# Patient Record
Sex: Female | Born: 1965 | Race: White | Hispanic: No | Marital: Married
Health system: Southern US, Academic
[De-identification: ages and names within clinical notes are randomized; demographics above are authoritative.]

## PROBLEM LIST (undated history)

## (undated) ENCOUNTER — Encounter

## (undated) ENCOUNTER — Ambulatory Visit

## (undated) ENCOUNTER — Telehealth

## (undated) ENCOUNTER — Ambulatory Visit: Payer: MEDICARE

## (undated) ENCOUNTER — Encounter: Attending: Physician Assistant | Primary: Physician Assistant

## (undated) ENCOUNTER — Ambulatory Visit: Payer: MEDICARE | Attending: Obesity Medicine | Primary: Obesity Medicine

## (undated) ENCOUNTER — Inpatient Hospital Stay

## (undated) DIAGNOSIS — K7460 Unspecified cirrhosis of liver: Secondary | ICD-10-CM

## (undated) DIAGNOSIS — D696 Thrombocytopenia, unspecified: Secondary | ICD-10-CM

## (undated) DIAGNOSIS — E039 Hypothyroidism, unspecified: Secondary | ICD-10-CM

## (undated) DIAGNOSIS — R569 Unspecified convulsions: Secondary | ICD-10-CM

## (undated) HISTORY — PX: ABDOMINAL HYSTERECTOMY: SHX81

## (undated) HISTORY — PX: CARDIAC CATHETERIZATION: SHX172

## (undated) HISTORY — PX: OTHER SURGICAL HISTORY: SHX169

## (undated) SURGERY — Surgical Case
Anesthesia: *Unknown

---
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
## 2000-07-18 ENCOUNTER — Emergency Department (HOSPITAL_COMMUNITY): Admission: EM | Admit: 2000-07-18 | Discharge: 2000-07-18 | Payer: Self-pay | Admitting: *Deleted

## 2004-03-02 ENCOUNTER — Other Ambulatory Visit: Admission: RE | Admit: 2004-03-02 | Discharge: 2004-03-02 | Payer: Self-pay

## 2006-06-11 ENCOUNTER — Emergency Department (HOSPITAL_COMMUNITY): Admission: EM | Admit: 2006-06-11 | Discharge: 2006-06-11 | Payer: Self-pay | Admitting: Emergency Medicine

## 2007-03-27 ENCOUNTER — Ambulatory Visit (HOSPITAL_COMMUNITY): Admission: RE | Admit: 2007-03-27 | Discharge: 2007-03-27 | Payer: Self-pay | Admitting: Family Medicine

## 2008-12-12 ENCOUNTER — Emergency Department (HOSPITAL_COMMUNITY): Admission: EM | Admit: 2008-12-12 | Discharge: 2008-12-12 | Payer: Self-pay | Admitting: Emergency Medicine

## 2012-05-28 ENCOUNTER — Other Ambulatory Visit (HOSPITAL_COMMUNITY): Payer: Self-pay | Admitting: Gastroenterology

## 2012-05-28 DIAGNOSIS — K7460 Unspecified cirrhosis of liver: Secondary | ICD-10-CM

## 2012-06-04 ENCOUNTER — Ambulatory Visit (HOSPITAL_COMMUNITY)
Admission: RE | Admit: 2012-06-04 | Discharge: 2012-06-04 | Disposition: A | Discharge status: Home / Self Care | Payer: PRIVATE HEALTH INSURANCE | Source: Ambulatory Visit | Attending: Gastroenterology | Admitting: Gastroenterology

## 2012-06-04 DIAGNOSIS — K7460 Unspecified cirrhosis of liver: Secondary | ICD-10-CM | POA: Insufficient documentation

## 2012-06-04 DIAGNOSIS — R161 Splenomegaly, not elsewhere classified: Secondary | ICD-10-CM | POA: Insufficient documentation

## 2013-03-30 ENCOUNTER — Emergency Department (HOSPITAL_COMMUNITY): Payer: BC Managed Care – PPO

## 2013-03-30 ENCOUNTER — Encounter (HOSPITAL_COMMUNITY): Payer: Self-pay | Admitting: Emergency Medicine

## 2013-03-30 ENCOUNTER — Emergency Department (HOSPITAL_COMMUNITY)
Admission: EM | Admit: 2013-03-30 | Discharge: 2013-03-30 | Disposition: A | Discharge status: Home / Self Care | Payer: BC Managed Care – PPO | Attending: Emergency Medicine | Admitting: Emergency Medicine

## 2013-03-30 DIAGNOSIS — R1013 Epigastric pain: Secondary | ICD-10-CM | POA: Insufficient documentation

## 2013-03-30 DIAGNOSIS — R109 Unspecified abdominal pain: Secondary | ICD-10-CM

## 2013-03-30 DIAGNOSIS — E039 Hypothyroidism, unspecified: Secondary | ICD-10-CM | POA: Insufficient documentation

## 2013-03-30 DIAGNOSIS — Z79899 Other long term (current) drug therapy: Secondary | ICD-10-CM | POA: Insufficient documentation

## 2013-03-30 DIAGNOSIS — I10 Essential (primary) hypertension: Secondary | ICD-10-CM | POA: Insufficient documentation

## 2013-03-30 DIAGNOSIS — Z90710 Acquired absence of both cervix and uterus: Secondary | ICD-10-CM | POA: Insufficient documentation

## 2013-03-30 HISTORY — DX: Hypothyroidism, unspecified: E03.9

## 2013-03-30 LAB — URINALYSIS, ROUTINE W REFLEX MICROSCOPIC
Bilirubin Urine: NEGATIVE
Hgb urine dipstick: NEGATIVE
Specific Gravity, Urine: 1.025 (ref 1.005–1.030)
Urobilinogen, UA: 1 mg/dL (ref 0.0–1.0)

## 2013-03-30 LAB — CBC WITH DIFFERENTIAL/PLATELET
Eosinophils Absolute: 0.2 10*3/uL (ref 0.0–0.7)
Lymphocytes Relative: 39 % (ref 12–46)
Lymphs Abs: 3.3 10*3/uL (ref 0.7–4.0)
Neutro Abs: 4.3 10*3/uL (ref 1.7–7.7)
Neutrophils Relative %: 50 % (ref 43–77)
Platelets: 170 10*3/uL (ref 150–400)
RBC: 4.71 MIL/uL (ref 3.87–5.11)
WBC: 8.6 10*3/uL (ref 4.0–10.5)

## 2013-03-30 LAB — COMPREHENSIVE METABOLIC PANEL
ALT: 36 U/L — ABNORMAL HIGH (ref 0–35)
Alkaline Phosphatase: 113 U/L (ref 39–117)
CO2: 26 mEq/L (ref 19–32)
GFR calc Af Amer: >90 mL/min (ref >90)
Glucose, Bld: 122 mg/dL — ABNORMAL HIGH (ref 70–99)
Potassium: 3.8 mEq/L (ref 3.5–5.1)
Sodium: 137 mEq/L (ref 135–145)
Total Protein: 8.3 g/dL (ref 6.0–8.3)

## 2013-03-30 LAB — URINE MICROSCOPIC-ADD ON

## 2013-03-30 MED ORDER — HYDROCODONE-ACETAMINOPHEN 5-325 MG PO TABS: 1.0000 | ORAL_TABLET | Freq: Four times a day (QID) | ORAL | Status: DC | PRN

## 2013-03-30 MED ORDER — PANTOPRAZOLE SODIUM 20 MG PO TBEC: 20.0000 mg | DELAYED_RELEASE_TABLET | Freq: Every day | ORAL | Status: DC

## 2013-03-30 MED ORDER — IOHEXOL 300 MG/ML  SOLN
50.0000 mL | Freq: Once | INTRAMUSCULAR | Status: AC | PRN
  Administered 2013-03-30: 50 mL via ORAL

## 2013-03-30 MED ORDER — ONDANSETRON HCL 4 MG/2ML IJ SOLN
4.0000 mg | Freq: Once | INTRAMUSCULAR | Status: AC
  Administered 2013-03-30: 4 mg via INTRAVENOUS
  Filled 2013-03-30: qty 2

## 2013-03-30 MED ORDER — IOHEXOL 300 MG/ML  SOLN
100.0000 mL | Freq: Once | INTRAMUSCULAR | Status: AC | PRN
  Administered 2013-03-30: 100 mL via INTRAVENOUS

## 2013-03-30 MED ORDER — SODIUM CHLORIDE 0.9 % IV SOLN
Freq: Once | INTRAVENOUS | Status: AC
  Administered 2013-03-30: 21:00:00 via INTRAVENOUS

## 2013-03-30 MED ORDER — PANTOPRAZOLE SODIUM 40 MG IV SOLR
40.0000 mg | Freq: Once | INTRAVENOUS | Status: AC
  Administered 2013-03-30: 40 mg via INTRAVENOUS
  Filled 2013-03-30: qty 40

## 2013-03-30 MED ORDER — HYDROMORPHONE HCL PF 1 MG/ML IJ SOLN
1.0000 mg | Freq: Once | INTRAMUSCULAR | Status: AC
  Administered 2013-03-30: 1 mg via INTRAVENOUS
  Filled 2013-03-30: qty 1

## 2013-03-30 MED ORDER — PROMETHAZINE HCL 25 MG PO TABS: 25.0000 mg | ORAL_TABLET | Freq: Four times a day (QID) | ORAL | Status: DC | PRN

## 2013-03-30 NOTE — ED Provider Notes (Addendum)
History     CSN: [id number]  Arrival date & time 03/30/13  4098   First MD Initiated Contact with Patient 03/30/13 2009      Chief Complaint  Patient presents with  . Abdominal Pain    (Consider location/radiation/quality/duration/timing/severity/associated sxs/prior treatment) Patient is a 47 y.o. female presenting with abdominal pain. The history is provided by the patient (pt complains of abd pain). No language interpreter was used.  Abdominal Pain Pain location:  Epigastric Pain quality: aching   Pain radiates to:  Does not radiate Pain severity:  Moderate Onset quality:  Gradual Timing:  Intermittent Progression:  Waxing and waning Chronicity:  New Associated symptoms: no chest pain, no cough, no diarrhea, no fatigue and no hematuria     Past Medical History  Diagnosis Date  . Hypertension   . Thyroid disease   . Hypothyroidism     Past Surgical History  Procedure Laterality Date  . Abdominal hysterectomy      History reviewed. No pertinent family history.  History  Substance Use Topics  . Smoking status: Never Smoker   . Smokeless tobacco: Not on file  . Alcohol Use: No    OB History   Grav Para Term Preterm Abortions TAB SAB Ect Mult Living                  Review of Systems  Constitutional: Negative for appetite change and fatigue.  HENT: Negative for congestion, sinus pressure and ear discharge.   Eyes: Negative for discharge.  Respiratory: Negative for cough.   Cardiovascular: Negative for chest pain.  Gastrointestinal: Positive for abdominal pain. Negative for diarrhea.  Genitourinary: Negative for frequency and hematuria.  Musculoskeletal: Negative for back pain.  Skin: Negative for rash.  Neurological: Negative for seizures and headaches.  Psychiatric/Behavioral: Negative for hallucinations.    Allergies  Review of patient's allergies indicates no known allergies.  Home Medications   Current Outpatient Rx  Name  Route  Sig   Dispense  Refill  . estradiol (ESTRACE) 0.5 MG tablet   Oral   Take 0.5 mg by mouth daily.         Marland Kitchen levothyroxine (SYNTHROID, LEVOTHROID) 137 MCG tablet   Oral   Take 137 mcg by mouth every morning.            BP 136/78  Pulse 84  Temp(Src) 98.4 F (36.9 C) (Oral)  Resp 22  Ht 5\' 2"  (1.575 m)  Wt 213 lb (96.616 kg)  BMI 38.95 kg/m2  SpO2 99%  Physical Exam  Constitutional: She is oriented to person, place, and time. She appears well-developed.  HENT:  Head: Normocephalic.  Eyes: Conjunctivae and EOM are normal. No scleral icterus.  Neck: Neck supple. No thyromegaly present.  Cardiovascular: Normal rate and regular rhythm.  Exam reveals no gallop and no friction rub.   No murmur heard. Pulmonary/Chest: No stridor. She has no wheezes. She has no rales. She exhibits no tenderness.  Abdominal: She exhibits no distension. There is tenderness. There is no rebound.  Tender epigastric  Musculoskeletal: Normal range of motion. She exhibits no edema.  Lymphadenopathy:    She has no cervical adenopathy.  Neurological: She is oriented to person, place, and time. Coordination normal.  Skin: No rash noted. No erythema.  Psychiatric: She has a normal mood and affect. Her behavior is normal.    ED Course  Procedures (including critical care time)  Labs Reviewed  URINALYSIS, ROUTINE W REFLEX MICROSCOPIC - Abnormal; Notable for the  following:    Leukocytes, UA SMALL (*)    All other components within normal limits  URINE MICROSCOPIC-ADD ON - Abnormal; Notable for the following:    Squamous Epithelial / LPF FEW (*)    Bacteria, UA FEW (*)    All other components within normal limits  URINE CULTURE  CBC WITH DIFFERENTIAL  COMPREHENSIVE METABOLIC PANEL  LIPASE, BLOOD   No results found.   No diagnosis found.    MDM    Pt to follow up with gi      Benny Lennert, MD 03/30/13 2116  Benny Lennert, MD 03/30/13 2226

## 2013-03-30 NOTE — ED Notes (Signed)
Pt alert & oriented x4, stable gait. Patient given discharge instructions, paperwork & prescription(s). Patient  instructed to stop at the registration desk to finish any additional paperwork. Patient verbalized understanding. Pt left department w/ no further questions. 

## 2013-03-30 NOTE — ED Notes (Signed)
Patient complaining of epigastric pain and nausea since Thursday.

## 2013-04-02 LAB — URINE CULTURE

## 2014-02-16 HISTORY — PX: LIVER BIOPSY: SHX301

## 2014-12-16 ENCOUNTER — Encounter: Payer: Self-pay | Admitting: Cardiovascular Disease

## 2014-12-17 ENCOUNTER — Other Ambulatory Visit (HOSPITAL_COMMUNITY): Payer: Self-pay | Admitting: Internal Medicine

## 2014-12-17 DIAGNOSIS — Z1231 Encounter for screening mammogram for malignant neoplasm of breast: Secondary | ICD-10-CM

## 2014-12-19 ENCOUNTER — Other Ambulatory Visit (HOSPITAL_COMMUNITY): Payer: Self-pay | Admitting: Internal Medicine

## 2014-12-19 DIAGNOSIS — R945 Abnormal results of liver function studies: Secondary | ICD-10-CM

## 2014-12-22 ENCOUNTER — Encounter (HOSPITAL_COMMUNITY): Payer: Self-pay | Admitting: *Deleted

## 2014-12-22 ENCOUNTER — Emergency Department (HOSPITAL_COMMUNITY)
Admission: EM | Admit: 2014-12-22 | Discharge: 2014-12-22 | Disposition: A | Discharge status: Home / Self Care | Payer: BLUE CROSS/BLUE SHIELD | Attending: Emergency Medicine | Admitting: Emergency Medicine

## 2014-12-22 DIAGNOSIS — Z79899 Other long term (current) drug therapy: Secondary | ICD-10-CM | POA: Diagnosis not present

## 2014-12-22 DIAGNOSIS — Z3202 Encounter for pregnancy test, result negative: Secondary | ICD-10-CM | POA: Diagnosis not present

## 2014-12-22 DIAGNOSIS — E669 Obesity, unspecified: Secondary | ICD-10-CM | POA: Insufficient documentation

## 2014-12-22 DIAGNOSIS — Z793 Long term (current) use of hormonal contraceptives: Secondary | ICD-10-CM | POA: Diagnosis not present

## 2014-12-22 DIAGNOSIS — E039 Hypothyroidism, unspecified: Secondary | ICD-10-CM | POA: Diagnosis not present

## 2014-12-22 DIAGNOSIS — I10 Essential (primary) hypertension: Secondary | ICD-10-CM | POA: Insufficient documentation

## 2014-12-22 DIAGNOSIS — Z90710 Acquired absence of both cervix and uterus: Secondary | ICD-10-CM | POA: Diagnosis not present

## 2014-12-22 DIAGNOSIS — R17 Unspecified jaundice: Secondary | ICD-10-CM | POA: Diagnosis not present

## 2014-12-22 DIAGNOSIS — R1010 Upper abdominal pain, unspecified: Secondary | ICD-10-CM | POA: Diagnosis present

## 2014-12-22 HISTORY — DX: Unspecified convulsions: R56.9

## 2014-12-22 LAB — COMPREHENSIVE METABOLIC PANEL
ALT: 38 U/L — ABNORMAL HIGH (ref 0–35)
AST: 81 U/L — ABNORMAL HIGH (ref 0–37)
Albumin: 3.1 g/dL — ABNORMAL LOW (ref 3.5–5.2)
Alkaline Phosphatase: 126 U/L — ABNORMAL HIGH (ref 39–117)
Anion gap: 6 (ref 5–15)
BUN: 11 mg/dL (ref 6–23)
CALCIUM: 8.5 mg/dL (ref 8.4–10.5)
CHLORIDE: 107 meq/L (ref 96–112)
CO2: 23 mmol/L (ref 19–32)
CREATININE: 0.46 mg/dL — AB (ref 0.50–1.10)
Glucose, Bld: 95 mg/dL (ref 70–99)
POTASSIUM: 4.2 mmol/L (ref 3.5–5.1)
SODIUM: 136 mmol/L (ref 135–145)
Total Bilirubin: 4 mg/dL — ABNORMAL HIGH (ref 0.3–1.2)
Total Protein: 6.8 g/dL (ref 6.0–8.3)

## 2014-12-22 LAB — URINALYSIS, ROUTINE W REFLEX MICROSCOPIC
GLUCOSE, UA: NEGATIVE mg/dL
Hgb urine dipstick: NEGATIVE
KETONES UR: NEGATIVE mg/dL
Nitrite: NEGATIVE
PH: 5.5 (ref 5.0–8.0)
Protein, ur: NEGATIVE mg/dL
Specific Gravity, Urine: >1.03 — ABNORMAL HIGH (ref 1.005–1.030)
UROBILINOGEN UA: 1 mg/dL (ref 0.0–1.0)

## 2014-12-22 LAB — CBC WITH DIFFERENTIAL/PLATELET
BASOS ABS: 0 10*3/uL (ref 0.0–0.1)
BASOS PCT: 1 % (ref 0–1)
EOS ABS: 0.1 10*3/uL (ref 0.0–0.7)
EOS PCT: 3 % (ref 0–5)
HCT: 36.8 % (ref 36.0–46.0)
HEMOGLOBIN: 12.4 g/dL (ref 12.0–15.0)
Lymphocytes Relative: 29 % (ref 12–46)
Lymphs Abs: 1.2 10*3/uL (ref 0.7–4.0)
MCH: 32 pg (ref 26.0–34.0)
MCHC: 33.7 g/dL (ref 30.0–36.0)
MCV: 95.1 fL (ref 78.0–100.0)
Monocytes Absolute: 0.5 10*3/uL (ref 0.1–1.0)
Monocytes Relative: 11 % (ref 3–12)
NEUTROS ABS: 2.4 10*3/uL (ref 1.7–7.7)
Neutrophils Relative %: 56 % (ref 43–77)
PLATELETS: 92 10*3/uL — AB (ref 150–400)
RBC: 3.87 MIL/uL (ref 3.87–5.11)
RDW: 16.6 % — AB (ref 11.5–15.5)
WBC: 4.2 10*3/uL (ref 4.0–10.5)

## 2014-12-22 LAB — LIPASE, BLOOD: Lipase: 41 U/L (ref 11–59)

## 2014-12-22 LAB — URINE MICROSCOPIC-ADD ON

## 2014-12-22 LAB — POC URINE PREG, ED: PREG TEST UR: NEGATIVE

## 2014-12-22 MED ORDER — MORPHINE SULFATE 4 MG/ML IJ SOLN
4.0000 mg | Freq: Once | INTRAMUSCULAR | Status: AC
  Administered 2014-12-22: 4 mg via INTRAVENOUS
  Filled 2014-12-22: qty 1

## 2014-12-22 NOTE — ED Notes (Addendum)
Upper abd pain, nausea, no vomiting,   Onset sz in December 2015, and told she had elevated liver tests.   ? Jaundice of eyes

## 2014-12-22 NOTE — ED Provider Notes (Signed)
CSN: [id number]     Arrival date & time 12/22/14  1906 History  This chart was scribed for Linwood Dibbles, MD by Tonye Royalty, ED Scribe. This patient was seen in room APA03/APA03 and the patient's care was started at 9:52 PM.    Chief Complaint  Patient presents with  . Abdominal Pain   The history is provided by the patient and the spouse. No language interpreter was used.   HPI Comments: Cynthia Stephenson is a 49 y.o. female who presents to the Emergency Department complaining of upper abdominal pain with onset 8 hours ago. She states that she had a seizure on 12/22 and was evaluated at the ED; she followed up with Dr. Margo Aye 6 days ago, at which time lab work revealed elevated liver enzymes. She was supposed to return for ultrasound 2 days from now, but was instructed to come to the ED in case of abdominal pain. Records indicate she had also had abnormal liver lab result and CT showing cirrhosis during ED visit in April of 2014 and was supposed to follow up with Dr. Hessie Diener; she and husband are unable to recall if she followed up. She states she has not had other blood work since 2014 and states that no mention was made of her liver enzymes upon ED visit to evaluate seizure on 12/22. She denies vomiting, diarrhea, or SOB.  Past Medical History  Diagnosis Date  . Hypertension   . Thyroid disease   . Hypothyroidism   . Seizures    Past Surgical History  Procedure Laterality Date  . Abdominal hysterectomy    . Abdominal hysterectomy     History reviewed. No pertinent family history. History  Substance Use Topics  . Smoking status: Never Smoker   . Smokeless tobacco: Not on file  . Alcohol Use: No   OB History    No data available     Review of Systems  Respiratory: Negative for shortness of breath.   Gastrointestinal: Positive for abdominal pain. Negative for vomiting and diarrhea.  All other systems reviewed and are negative.     Allergies  Review of patient's allergies indicates no  known allergies.  Home Medications   Prior to Admission medications   Medication Sig Start Date End Date Taking? Authorizing Provider  estradiol (ESTRACE) 0.5 MG tablet Take 0.5 mg by mouth daily.    Historical Provider, MD  HYDROcodone-acetaminophen (NORCO/VICODIN) 5-325 MG per tablet Take 1 tablet by mouth every 6 (six) hours as needed for pain. 03/30/13   Benny Lennert, MD  levothyroxine (SYNTHROID, LEVOTHROID) 137 MCG tablet Take 137 mcg by mouth every morning.     Historical Provider, MD  pantoprazole (PROTONIX) 20 MG tablet Take 1 tablet (20 mg total) by mouth daily. 03/30/13   Benny Lennert, MD  promethazine (PHENERGAN) 25 MG tablet Take 1 tablet (25 mg total) by mouth every 6 (six) hours as needed for nausea. 03/30/13   Benny Lennert, MD   BP 116/58 mmHg  Pulse 86  Temp(Src) 99.1 F (37.3 C) (Oral)  Resp 20  Ht  (1.575 m)  Wt 225 lb (102.059 kg)  BMI 41.14 kg/m2  SpO2 99% Physical Exam  Constitutional: She appears well-developed. No distress.  obese  HENT:  Head: Normocephalic and atraumatic.  Right Ear: External ear normal.  Left Ear: External ear normal.  Eyes: Conjunctivae are normal. Right eye exhibits no discharge. Left eye exhibits no discharge. No scleral icterus.  Neck: Neck supple. No tracheal deviation  present.  Cardiovascular: Normal rate, regular rhythm and intact distal pulses.   Pulmonary/Chest: Effort normal and breath sounds normal. No stridor. No respiratory distress. She has no wheezes. She has no rales.  Abdominal: Soft. Bowel sounds are normal. She exhibits no distension. There is no tenderness. There is no rebound and no guarding.  Musculoskeletal: She exhibits no edema or tenderness.  Neurological: She is alert. She has normal strength. No cranial nerve deficit (no facial droop, extraocular movements intact, no slurred speech) or sensory deficit. She exhibits normal muscle tone. She displays no seizure activity. Coordination normal.  Skin: Skin  is warm and dry. No rash noted.  Psychiatric: She has a normal mood and affect.  Nursing note and vitals reviewed.   ED Course  Procedures (including critical care time)  DIAGNOSTIC STUDIES: Oxygen Saturation is 99% on room air, normal by my interpretation.    COORDINATION OF CARE: 10:00 PM Discussed treatment plan with patient at beside, the patient agrees with the plan and has no further questions at this time.   Labs Review Labs Reviewed  CBC WITH DIFFERENTIAL - Abnormal; Notable for the following:    RDW 16.6 (*)    Platelets 92 (*)    All other components within normal limits  COMPREHENSIVE METABOLIC PANEL - Abnormal; Notable for the following:    Creatinine, Ser 0.46 (*)    Albumin 3.1 (*)    AST 81 (*)    ALT 38 (*)    Alkaline Phosphatase 126 (*)    Total Bilirubin 4.0 (*)    All other components within normal limits  URINALYSIS, ROUTINE W REFLEX MICROSCOPIC - Abnormal; Notable for the following:    Specific Gravity, Urine >1.030 (*)    Bilirubin Urine SMALL (*)    Leukocytes, UA SMALL (*)    All other components within normal limits  URINE MICROSCOPIC-ADD ON - Abnormal; Notable for the following:    Squamous Epithelial / LPF MANY (*)    All other components within normal limits  LIPASE, BLOOD  POC URINE PREG, ED     MDM   Final diagnoses:  Elevated bilirubin  Jaundice   I reviewed the patient's previous record 2014. Her discharge instructions instruct her to follow-up with Dr. Karilyn Cotaehman.  The patient had findings of cirrhosis on her CT scan. Her bilirubin was elevated at 2.0 back in 2014.  Patient did not follow up with anyone after that emergency department visit.  She denies any alcohol use.  I stressed the importance of following up with a gastroenterologist. She is scheduled see her PCP this week and is going to have an outpatient ultrasound.  Feel that she's having any symptoms to suggest acute cholecystitis. The patient can safely continue that outpatient  workup. I personally performed the services described in this documentation, which was scribed in my presence.  The recorded information has been reviewed and is accurate.   Linwood DibblesJon Hudson Majkowski, MD 12/22/14 2325

## 2014-12-22 NOTE — Discharge Instructions (Signed)
Jaundice °Jaundice is a yellowish discoloration of the skin, whites of the eyes, and mucous membranes. It is caused by increased levels of bilirubin in the blood (hyperbilirubinemia). Bilirubin is produced by the normal breakdown of red blood cells. Jaundice may mean the liver or bile system is not working normally. °CAUSES  °The most common causes include: °· Viral hepatitis. °· Gallstones. °· Excess use of alcohol. °· Liver disease. °· Certain cancers. °SYMPTOMS  °· Yellow color to the skin, whites of the eyes, or mucous membranes. °· Dark brown colored urine. °· Stomach pain. °· Light or clay colored stool. °· Itchy skin. °DIAGNOSIS  °· Your history will be taken along with a physical exam. °· Urine and blood tests. °· Abdominal ultrasound. °· CT scans. °· MRI. °· Liver biopsy if the liver disease is suspected. °· Endoscopic retrograde cholangiopancreatography (ERCP). °TREATMENT  °Treatment depends on the cause or related to the treatment of an underlying condition. For example, if jaundice is caused by gallstones, the stones or gallbladder may need to be removed. Other treatments may include: °· Rest. °· Stopping a certain medicine if it is causing the jaundice. °· Giving fluid through the vein (IV fluids). °· Surgery  (removing gallstones, cancers). °Some conditions that cause jaundice can be fatal if not treated. °HOME CARE INSTRUCTIONS  °· Rest. °· Drink enough fluids to keep your urine clear or pale yellow. °· Avoid all alcoholic drinks. °· Only take over-the-counter or prescription medicines for nausea, vomiting, itching, pain, discomfort, or fever as directed by your caregiver. °· If jaundice is due to viral hepatitis or an infection: °¨ Avoid close contact with people. °¨ Avoid preparing food for others. °¨ Avoid sharing utensils with others. °¨ Wash your hands often. °· Keep all follow-up appointments with your caregiver. °· Use skin lotions to relieve itching. °SEEK IMMEDIATE MEDICAL CARE IF:  °· You  have increased pain. °· You have repeated vomiting. °· You become dehydrated. °· You have a fever or persistent symptoms for more than 72 hours. °· You have a fever and your symptoms suddenly get worse. °· You become weak or confused. °· You develop a severe headache. °MAKE SURE YOU:  °· Understand these instructions. °· Will watch your condition. °· Will get help right away if you are not doing well or get worse. °Document Released: 11/28/2005 Document Revised: 02/20/2012 Document Reviewed: 11/12/2010 °ExitCare® Patient Information ©2015 [employer]. This information is not intended to replace advice given to you by your health care provider. Make sure you discuss any questions you have with your health care provider. ° °

## 2014-12-24 ENCOUNTER — Ambulatory Visit (HOSPITAL_COMMUNITY)
Admission: RE | Admit: 2014-12-24 | Discharge: 2014-12-24 | Disposition: A | Discharge status: Home / Self Care | Payer: PRIVATE HEALTH INSURANCE | Source: Ambulatory Visit | Attending: Internal Medicine | Admitting: Internal Medicine

## 2014-12-24 ENCOUNTER — Ambulatory Visit (HOSPITAL_COMMUNITY)
Admission: RE | Admit: 2014-12-24 | Discharge: 2014-12-24 | Disposition: A | Discharge status: Home / Self Care | Payer: BLUE CROSS/BLUE SHIELD | Source: Ambulatory Visit | Attending: Internal Medicine | Admitting: Internal Medicine

## 2014-12-24 DIAGNOSIS — R945 Abnormal results of liver function studies: Secondary | ICD-10-CM | POA: Insufficient documentation

## 2014-12-24 DIAGNOSIS — R162 Hepatomegaly with splenomegaly, not elsewhere classified: Secondary | ICD-10-CM | POA: Diagnosis not present

## 2014-12-24 DIAGNOSIS — Z1231 Encounter for screening mammogram for malignant neoplasm of breast: Secondary | ICD-10-CM

## 2014-12-26 ENCOUNTER — Encounter: Payer: Self-pay | Admitting: Diagnostic Neuroimaging

## 2014-12-26 ENCOUNTER — Telehealth: Payer: Self-pay | Admitting: Diagnostic Neuroimaging

## 2014-12-26 ENCOUNTER — Ambulatory Visit (INDEPENDENT_AMBULATORY_CARE_PROVIDER_SITE_OTHER): Payer: BLUE CROSS/BLUE SHIELD | Admitting: Diagnostic Neuroimaging

## 2014-12-26 VITALS — BP 131/70 | HR 80 | Temp 96.1°F | Ht 62.0 in | Wt 227.0 lb

## 2014-12-26 DIAGNOSIS — D696 Thrombocytopenia, unspecified: Secondary | ICD-10-CM

## 2014-12-26 DIAGNOSIS — R569 Unspecified convulsions: Secondary | ICD-10-CM

## 2014-12-26 NOTE — Progress Notes (Signed)
[facility]  PATIENT: Cynthia Stephenson DOB: 10-15-1966  REFERRING CLINICIAN: Z Hall HISTORY FROM: patient, daughter (accompanied by grand-daughter) REASON FOR VISIT: new consult   HISTORICAL  CHIEF COMPLAINT:  Chief Complaint  Patient presents with  . Seizures    HISTORY OF PRESENT ILLNESS:   49 year old right-handed female here for evaluation of possible seizure. 12/02/2014, patient was feeling badly, had previous day of diarrhea. Patient left work early. After her husband came home they went shopping. While at the store patient had episode of staring, eyes rolling back, falling to the ground, convulsions for 1 minute with tongue biting and incontinence. This was witnessed by patient's husband. Bystanders also witnessed. Apparently a nurse was a bystander and.patient was having a seizure. Per medics were called to scene evaluated patient and took her to the local hospital. Patient apparently was talking, confused, slow to respond after the event. Patient has no memory until arriving in the emergency room.  Patient was admitted overnight, had CT scan the blood testing, diagnosed with urinary tract infection, and discharged on level at levetiracetam 500 mg at bedtime.   Patient has family history of seizure in maternal grandfather and maternal uncle. No history of head trauma, encephalitis or meningitis.  Also of note patient has been having some abdominal pain over past 1-2 years. Patient has had elevated LFTs in the past. Recently blood work demonstrates elevated bilirubin, low platelet levels, elevated alkaline phosphatase. Patient has noted easy bruising and bleeding lately.   REVIEW OF SYSTEMS: Full 14 system review of systems performed and notable only for memory loss confusion headache sleepiness snoring dizziness feeling cold increased thirst crashed not asleep snoring easy bruising easy bleeding weight gain fevers chills fatigue itching.  ALLERGIES: No Known  Allergies  HOME MEDICATIONS: Outpatient Prescriptions Prior to Visit  Medication Sig Dispense Refill  . estradiol (ESTRACE) 0.5 MG tablet Take 0.5 mg by mouth daily.    Marland Kitchen HYDROcodone-acetaminophen (NORCO/VICODIN) 5-325 MG per tablet Take 1 tablet by mouth every 6 (six) hours as needed for pain. 20 tablet 0  . levothyroxine (SYNTHROID, LEVOTHROID) 137 MCG tablet Take 137 mcg by mouth every morning.     . pantoprazole (PROTONIX) 20 MG tablet Take 1 tablet (20 mg total) by mouth daily. 30 tablet 0  . promethazine (PHENERGAN) 25 MG tablet Take 1 tablet (25 mg total) by mouth every 6 (six) hours as needed for nausea. 15 tablet 0   No facility-administered medications prior to visit.    PAST MEDICAL HISTORY: Past Medical History  Diagnosis Date  . Hypertension   . Thyroid disease   . Hypothyroidism   . Seizures     PAST SURGICAL HISTORY: Past Surgical History  Procedure Laterality Date  . Abdominal hysterectomy    . Abdominal hysterectomy      FAMILY HISTORY: Family History  Problem Relation Age of Onset  . Breast cancer Mother   . Skin cancer Mother   . Ovarian cancer Mother   . Thyroid cancer Mother   . Diabetes Father   . Heart disease Father     SOCIAL HISTORY:  History   Social History  . Marital Status: Married    Spouse Name: Loraine Leriche    Number of Children: 3  . Years of Education: 12   Occupational History  .  Other    [employer]   Social History Main Topics  . Smoking status: Never Smoker   . Smokeless tobacco: Not on  file  . Alcohol Use: No  . Drug Use: No  . Sexual Activity: Not on file   Other Topics Concern  . Not on file   Social History Narrative   Lives at home with Husband    caffeine use: yes     PHYSICAL EXAM  Filed Vitals:   12/26/14 0924  BP: 131/70  Pulse: 80  Temp: 96.1 F (35.6 C)  TempSrc: Oral  Height: 5\' 2"  (1.575 m)  Weight: 227 lb (102.967 kg)    Body mass index is  41.51 kg/(m^2).  No exam data present  No flowsheet data found.  GENERAL EXAM: Patient is in no distress; well developed, nourished and groomed; neck is supple  CARDIOVASCULAR: Regular rate and rhythm, no murmurs, no carotid bruits  NEUROLOGIC: MENTAL STATUS: awake, alert, oriented to person, place and time, recent and remote memory intact, normal attention and concentration, language fluent, comprehension intact, naming intact, fund of knowledge appropriate CRANIAL NERVE: no papilledema on fundoscopic exam, pupils equal and reactive to light, visual fields full to confrontation, extraocular muscles intact, no nystagmus, facial sensation and strength symmetric, hearing intact, palate elevates symmetrically, uvula midline, shoulder shrug symmetric, tongue midline. MOTOR: normal bulk and tone, full strength in the BUE, BLE; POSTURAL AND ACTION TREMOR IN BUE SENSORY: normal and symmetric to light touch, pinprick, temperature, vibration COORDINATION: finger-nose-finger, fine finger movements normal REFLEXES: deep tendon reflexes present and symmetric GAIT/STATION: narrow based gait; able to walk on toes, heels and tandem; romberg is negative    DIAGNOSTIC DATA (LABS, IMAGING, TESTING) - I reviewed patient records, labs, notes, testing and imaging myself where available.  Lab Results  Component Value Date   WBC 4.2 12/22/2014   HGB 12.4 12/22/2014   HCT 36.8 12/22/2014   MCV 95.1 12/22/2014   PLT 92* 12/22/2014      Component Value Date/Time   NA 136 12/22/2014 1913   K 4.2 12/22/2014 1913   CL 107 12/22/2014 1913   CO2 23 12/22/2014 1913   GLUCOSE 95 12/22/2014 1913   BUN 11 12/22/2014 1913   CREATININE 0.46* 12/22/2014 1913   CALCIUM 8.5 12/22/2014 1913   PROT 6.8 12/22/2014 1913   ALBUMIN 3.1* 12/22/2014 1913   AST 81* 12/22/2014 1913   ALT 38* 12/22/2014 1913   ALKPHOS 126* 12/22/2014 1913   BILITOT 4.0* 12/22/2014 1913   GFRNONAA >90 12/22/2014 1913   GFRAA >90  12/22/2014 1913   No results found for: CHOL, HDL, LDLCALC, LDLDIRECT, TRIG, CHOLHDL No results found for: ZOXW9UHGBA1C No results found for: VITAMINB12 No results found for: TSH     ASSESSMENT AND PLAN  49 y.o. year old female here with new onset seizure 12/02/2014, in setting of urinary tract infection, diarrheal illness. Also with thrombocytopenia and hyperbilirubinemia, unclear etiology. For now agree with continuing antiseizure medication. At current dose. I will check MRI brain and EEG. I've asked patient not to drive for 6 months until she is seizure free. Patient needs follow-up with PCP regarding platelet and liver function abnormalities.   PLAN:  Orders Placed This Encounter  Procedures  . MR Brain W Wo Contrast  . EEG adult   Return in about 6 weeks (around 02/06/2015).    Suanne MarkerVIKRAM R. PENUMALLI, MD 12/26/2014, 10:30 AM Certified in Neurology, Neurophysiology and Neuroimaging  [facility] [address], [address] [place], [place] [address] [phone]

## 2014-12-26 NOTE — Telephone Encounter (Signed)
Patient needs a letter written stating Dr. 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 She has an appointment on Monday and will pay the fee and pick the letter up then. She can be reached at [phone]. A detailed message can also be left at this number.

## 2014-12-26 NOTE — Patient Instructions (Signed)
1. Continue levetiracetam 500mg  at bedtime. 2. No driving until seizure free x 6 months. 3. I will setup testing.      - According to  law, you can not drive unless you are seizure free for at least 6 months and under physician's care.   - Please maintain seizure precautions. Do not participate in activities where a loss of awareness could harm you or someone else. No swimming alone, no tub bathing, no hot tubs, no driving, no operating motorized vehicles (cars, ATVs, motocycles, etc), lawnmowers or power tools. No standing at heights, such as rooftops, ladders or stairs. Avoid hot objects such as stoves, heaters, open fires. Wear a helmet when riding a bicycle, scooter, skateboard, etc. and avoid areas of traffic. Set your water heater to 120 degrees or less.

## 2014-12-29 ENCOUNTER — Ambulatory Visit (INDEPENDENT_AMBULATORY_CARE_PROVIDER_SITE_OTHER): Payer: BLUE CROSS/BLUE SHIELD | Admitting: Diagnostic Neuroimaging

## 2014-12-29 ENCOUNTER — Telehealth: Payer: Self-pay | Admitting: *Deleted

## 2014-12-29 DIAGNOSIS — R569 Unspecified convulsions: Secondary | ICD-10-CM

## 2014-12-29 NOTE — Telephone Encounter (Signed)
Form on Casandra desk. 

## 2014-12-30 DIAGNOSIS — Z0289 Encounter for other administrative examinations: Secondary | ICD-10-CM

## 2014-12-31 NOTE — Procedures (Signed)
   [facility]  EEG (ELECTROENCEPHALOGRAM) REPORT   STUDY DATE: 12/29/14  PATIENT NAME: Cynthia Stephenson DOB: Mar 11, 1966 MRN: [id number]  ORDERING CLINICIAN: Joycelyn SchmidVikram Tahmir Kleckner, MD   TECHNOLOGIST: Kaylyn LimSue Fox  TECHNIQUE: Electroencephalogram was recorded utilizing standard 10-20 system of lead placement and reformatted into average and bipolar montages.  RECORDING TIME: 30 minute ACTIVATION: hyperventilation and photic stimulation  CLINICAL INFORMATION: 49 year old female with new onset seizure.  FINDINGS: Background rhythms of 8-9 hertz and 30-40 microvolts. No focal, lateralizing, epileptiform activity or seizures are seen. Patient recorded in the awake and drowsy state. EKG channel shows regular rhythm 66 beats per minute.   IMPRESSION:  Normal EEG in the awake and drowsy states.    INTERPRETING PHYSICIAN:  Suanne MarkerVIKRAM R. Gregorio Worley, MD Certified in Neurology, Neurophysiology and Neuroimaging  [facility] [address], [address] [place], [place] [address] [phone]

## 2015-01-01 ENCOUNTER — Telehealth: Payer: Self-pay | Admitting: *Deleted

## 2015-01-01 NOTE — Telephone Encounter (Signed)
Spoke with patient on the phone.  Obtained information about location of hospital in which she stayed overnight.  Paperwork given to Dr. Marjory LiesPenumalli and will fax when completed. Will keep a copy for her when she returns for her follow-up.

## 2015-01-05 ENCOUNTER — Telehealth: Payer: Self-pay | Admitting: *Deleted

## 2015-01-05 NOTE — Telephone Encounter (Signed)
Letter completed. -VRP

## 2015-01-05 NOTE — Telephone Encounter (Signed)
Form,[employer] received,completed by Dr Marjory LiesPenumalli and [facility] faxed 01-05-15.

## 2015-01-06 ENCOUNTER — Telehealth: Payer: Self-pay | Admitting: *Deleted

## 2015-01-06 NOTE — Telephone Encounter (Signed)
Spoke with pt on the phone, have a copy of her doctors note to not drive for 6 months due to seizures. Asked if she wanted to come pick it up or have it mailed to her and she asked to have a copy saved and she would pick it up the next time she came in to the office.

## 2015-01-12 ENCOUNTER — Encounter: Payer: Self-pay | Admitting: Diagnostic Neuroimaging

## 2015-01-13 ENCOUNTER — Telehealth: Payer: Self-pay | Admitting: Diagnostic Neuroimaging

## 2015-01-13 MED ORDER — LEVETIRACETAM 500 MG PO TABS: 500.0000 mg | ORAL_TABLET | Freq: Every day | ORAL | Status: DC

## 2015-01-13 NOTE — Telephone Encounter (Signed)
Rx has been sent  

## 2015-01-13 NOTE — Telephone Encounter (Signed)
Pt is calling requsting a Rx for levETIRAcetam (KEPPRA) 500 MG tablet. [facility] [place].  If you have any questions please call if no answer you may leave a voice messge.

## 2015-01-14 ENCOUNTER — Encounter (INDEPENDENT_AMBULATORY_CARE_PROVIDER_SITE_OTHER): Payer: Self-pay | Admitting: *Deleted

## 2015-01-21 ENCOUNTER — Ambulatory Visit: Payer: BLUE CROSS/BLUE SHIELD | Admitting: Cardiovascular Disease

## 2015-01-21 ENCOUNTER — Ambulatory Visit
Admission: RE | Admit: 2015-01-21 | Discharge: 2015-01-21 | Disposition: A | Discharge status: Home / Self Care | Payer: BLUE CROSS/BLUE SHIELD | Source: Ambulatory Visit | Attending: Diagnostic Neuroimaging | Admitting: Diagnostic Neuroimaging

## 2015-01-21 DIAGNOSIS — R569 Unspecified convulsions: Secondary | ICD-10-CM

## 2015-01-21 MED ORDER — GADOBENATE DIMEGLUMINE 529 MG/ML IV SOLN
20.0000 mL | Freq: Once | INTRAVENOUS | Status: AC | PRN
  Administered 2015-01-21: 20 mL via INTRAVENOUS

## 2015-01-27 ENCOUNTER — Encounter: Payer: Self-pay | Admitting: Cardiovascular Disease

## 2015-01-27 ENCOUNTER — Ambulatory Visit (INDEPENDENT_AMBULATORY_CARE_PROVIDER_SITE_OTHER): Payer: BLUE CROSS/BLUE SHIELD | Admitting: Cardiovascular Disease

## 2015-01-27 ENCOUNTER — Encounter: Payer: Self-pay | Admitting: *Deleted

## 2015-01-27 VITALS — BP 132/82 | HR 79 | Ht 62.0 in | Wt 234.0 lb

## 2015-01-27 DIAGNOSIS — R011 Cardiac murmur, unspecified: Secondary | ICD-10-CM

## 2015-01-27 DIAGNOSIS — R079 Chest pain, unspecified: Secondary | ICD-10-CM

## 2015-01-27 DIAGNOSIS — R600 Localized edema: Secondary | ICD-10-CM

## 2015-01-27 DIAGNOSIS — R162 Hepatomegaly with splenomegaly, not elsewhere classified: Secondary | ICD-10-CM

## 2015-01-27 DIAGNOSIS — R569 Unspecified convulsions: Secondary | ICD-10-CM

## 2015-01-27 DIAGNOSIS — K7460 Unspecified cirrhosis of liver: Secondary | ICD-10-CM

## 2015-01-27 NOTE — Progress Notes (Signed)
Patient ID: Cynthia CravenLinda Ballowe, female   DOB: 10-21-1966, 49 y.o.   MRN: [id number]       CARDIOLOGY CONSULT NOTE  Patient ID: Cynthia CravenLinda Cahn MRN: [id number] DOB/AGE: 49-09-1966 49 y.o.  Admit date: (Not on file) Primary Physician Catalina PizzaHALL, ZACH, MD  Reason for Consultation: chest pain, leg swelling, murmur  HPI: The patient is a 49 year old woman with a history of obesity and hypothyroidism who presents for the evaluation of chest pain. She was evaluated by her primary care physician on 01/16/15 and had been complaining of chest pain which began in the afternoon that day. The day before that she was in the hospital all day with her mother who had some chest pain and underwent a coronary angiogram by Dr. Peter SwazilandJordan, and the patient was feeling very stressed about this. She had also been having swelling in her legs but denied shortness of breath. She was found to have a murmur and is referred today. She also had a possible seizure in 12/15 and underwent an MRI earlier this month which did not reveal any acute findings. It reportedly occurred in the context of a urinary tract infection and hypomagnesemia and hypokalemia. She underwent an abdominal ultrasound on 12/24/14 which demonstrated cirrhosis and hepatosplenomegaly and she is scheduled to see Gastro tetralogy. On 12/22/14 albumin was 3.1, AST 81, ALT 38, alkaline phosphatase 128, total bilirubin elevated at 4. She denies a history of alcohol use. She has taken Tylenol for pain.  Today, she denies chest pain, palpitations, leg swelling, and shortness of breath altogether. She took meloxicam for her chest pain as there appeared to be a musculoskeletal component and this has since resolved. She took 2 pills of Lasix for leg swelling and this also resolved. She has been drinking dandelion root tea in an attempt to decrease her liver enzymes. She was prescribed Lexapro and trazodone but is not taking either of these. She is taking Keppra and Synthroid.  Soc:  Married. 3 grown children, 6 grandchildren. Works as a LawyerCNA.  No Known Allergies  Current Outpatient Prescriptions  Medication Sig Dispense Refill  . Cranberry 500 MG CAPS Take by mouth.    . levETIRAcetam (KEPPRA) 500 MG tablet Take 1 tablet (500 mg total) by mouth at bedtime. 90 tablet 1  . levothyroxine (SYNTHROID, LEVOTHROID) 25 MCG tablet Take 1 tablet by mouth every morning.  0  . Multiple Vitamin (ONCE DAILY) TABS Take by mouth. womens daily    . escitalopram (LEXAPRO) 10 MG tablet Take 10 mg by mouth daily.    . meloxicam (MOBIC) 15 MG tablet Take 15 mg by mouth daily.    . traZODone (DESYREL) 50 MG tablet Take 50 mg by mouth at bedtime.     No current facility-administered medications for this visit.    Past Medical History  Diagnosis Date  . Hypertension   . Thyroid disease   . Hypothyroidism   . Seizures     Past Surgical History  Procedure Laterality Date  . Abdominal hysterectomy    . Abdominal hysterectomy      History   Social History  . Marital Status: Married    Spouse Name: Loraine LericheMark  . Number of Children: 3  . Years of Education: 12   Occupational History  .  Other    [employer]   Social History Main Topics  . Smoking status: Never Smoker   . Smokeless tobacco: Never Used  . Alcohol Use: No  . Drug  Use: No  . Sexual Activity: Not on file   Other Topics Concern  . Not on file   Social History Narrative   Lives at home with Husband    caffeine use: yes     No family history of premature CAD in 1st degree relatives.  Prior to Admission medications   Medication Sig Start Date End Date Taking? Authorizing Provider  Cranberry 500 MG CAPS Take by mouth.   Yes Historical Provider, MD  levETIRAcetam (KEPPRA) 500 MG tablet Take 1 tablet (500 mg total) by mouth at bedtime. 01/13/15  Yes Suanne Marker, MD  levothyroxine (SYNTHROID, LEVOTHROID) 25 MCG tablet Take 1 tablet by mouth every morning. 12/18/14   Yes Historical Provider, MD  Multiple Vitamin (ONCE DAILY) TABS Take by mouth. womens daily   Yes Historical Provider, MD  escitalopram (LEXAPRO) 10 MG tablet Take 10 mg by mouth daily.    Historical Provider, MD  meloxicam (MOBIC) 15 MG tablet Take 15 mg by mouth daily.    Historical Provider, MD  traZODone (DESYREL) 50 MG tablet Take 50 mg by mouth at bedtime.    Historical Provider, MD     Review of systems complete and found to be negative unless listed above in HPI     Physical exam Blood pressure 132/82, pulse 79, height  (1.575 m), weight 234 lb (106.142 kg), SpO2 98 %. General: NAD, obese Neck: No JVD, no thyromegaly or thyroid nodule.  Lungs: Clear to auscultation bilaterally with normal respiratory effort. CV: Nondisplaced PMI. Regular rate and rhythm, normal S1/S2, no S3/S4, soft 1/6 holosystolic murmur along left sternal border.  No peripheral edema.  No carotid bruit.  Normal pedal pulses.  Abdomen: Soft, nontender, obese.  Skin: Intact without lesions or rashes.  Neurologic: Alert and oriented x 3.  Psych: Normal affect. Extremities: No clubbing or cyanosis.  HEENT: +scleral icterus  ECG: Most recent ECG reviewed.  Labs:   Lab Results  Component Value Date   WBC 4.2 12/22/2014   HGB 12.4 12/22/2014   HCT 36.8 12/22/2014   MCV 95.1 12/22/2014   PLT 92* 12/22/2014   No results for input(s): NA, K, CL, CO2, BUN, CREATININE, CALCIUM, PROT, BILITOT, ALKPHOS, ALT, AST, GLUCOSE in the last 168 hours.  Invalid input(s): LABALBU No results found for: CKTOTAL, CKMB, CKMBINDEX, TROPONINI No results found for: CHOL No results found for: HDL No results found for: LDLCALC No results found for: TRIG No results found for: CHOLHDL No results found for: LDLDIRECT       Studies: No results found.  ASSESSMENT AND PLAN:  1. Leg swelling: This has since resolved with two tablets of Lasix. This can occur in the context of hepatobiliary disease. Her murmur is soft  and not pathologic. There is no parasternal lift to suggest RV enlargement. If she were to develop a recurrence, I would consider echocardiography to assess LV and RV function. 2. Murmur: Her murmur is soft and not pathologic, and likely consistent with mild tricuspid regurgitation. This may be related to obesity-hypoventilation syndrome. Will not pursue an echocardiogram at this time unless she were to develop symptom recurrence. 3. Chest pain: Musculoskeletal and resolved with anti-inflammatory agents. No recurrences. 4. Hepatosplenomegaly with cirrhosis and hyperbilirubinemia: Concerning for hepatobiliary disease with possible obstruction given elevated alkaline phosphatase and ultrasonographic evidence of cirrhosis. Scheduled to see GI. 5. Seizures: Occurred in context of UTI and electrolyte abnormalities. No recurrences. On Keppra.  Dispo: f/u prn.  Signed: Prentice Docker, M.D., F.A.C.C.  01/27/2015, 2:54 PM

## 2015-01-27 NOTE — Patient Instructions (Signed)
Continue all current medications. Follow up as needed  

## 2015-02-03 ENCOUNTER — Telehealth: Payer: Self-pay | Admitting: *Deleted

## 2015-02-03 NOTE — Telephone Encounter (Signed)
Spoke to the pt on the phone and got her appt rescheduled

## 2015-02-10 ENCOUNTER — Encounter (INDEPENDENT_AMBULATORY_CARE_PROVIDER_SITE_OTHER): Payer: Self-pay | Admitting: *Deleted

## 2015-02-10 ENCOUNTER — Ambulatory Visit (INDEPENDENT_AMBULATORY_CARE_PROVIDER_SITE_OTHER): Payer: BLUE CROSS/BLUE SHIELD | Admitting: Internal Medicine

## 2015-02-10 ENCOUNTER — Encounter (INDEPENDENT_AMBULATORY_CARE_PROVIDER_SITE_OTHER): Payer: Self-pay | Admitting: Internal Medicine

## 2015-02-10 VITALS — BP 132/82 | HR 72 | Temp 97.7°F | Ht 62.0 in | Wt 231.8 lb

## 2015-02-10 DIAGNOSIS — E039 Hypothyroidism, unspecified: Secondary | ICD-10-CM | POA: Insufficient documentation

## 2015-02-10 DIAGNOSIS — R748 Abnormal levels of other serum enzymes: Secondary | ICD-10-CM

## 2015-02-10 DIAGNOSIS — R17 Unspecified jaundice: Secondary | ICD-10-CM

## 2015-02-10 DIAGNOSIS — F32A Depression, unspecified: Secondary | ICD-10-CM | POA: Insufficient documentation

## 2015-02-10 DIAGNOSIS — F329 Major depressive disorder, single episode, unspecified: Secondary | ICD-10-CM

## 2015-02-10 DIAGNOSIS — R569 Unspecified convulsions: Secondary | ICD-10-CM

## 2015-02-10 DIAGNOSIS — K7460 Unspecified cirrhosis of liver: Secondary | ICD-10-CM

## 2015-02-10 LAB — CBC WITH DIFFERENTIAL/PLATELET
BASOS ABS: 0.1 10*3/uL (ref 0.0–0.1)
Basophils Relative: 1 % (ref 0–1)
EOS PCT: 4 % (ref 0–5)
Eosinophils Absolute: 0.2 10*3/uL (ref 0.0–0.7)
HEMATOCRIT: 38.4 % (ref 36.0–46.0)
Hemoglobin: 12.7 g/dL (ref 12.0–15.0)
LYMPHS PCT: 29 % (ref 12–46)
Lymphs Abs: 1.8 10*3/uL (ref 0.7–4.0)
MCH: 30.8 pg (ref 26.0–34.0)
MCHC: 33.1 g/dL (ref 30.0–36.0)
MCV: 93 fL (ref 78.0–100.0)
MONO ABS: 0.6 10*3/uL (ref 0.1–1.0)
MPV: 11.7 fL (ref 8.6–12.4)
Monocytes Relative: 9 % (ref 3–12)
Neutro Abs: 3.5 10*3/uL (ref 1.7–7.7)
Neutrophils Relative %: 57 % (ref 43–77)
Platelets: 118 10*3/uL — ABNORMAL LOW (ref 150–400)
RBC: 4.13 MIL/uL (ref 3.87–5.11)
RDW: 18 % — ABNORMAL HIGH (ref 11.5–15.5)
WBC: 6.2 10*3/uL (ref 4.0–10.5)

## 2015-02-10 LAB — FERRITIN: Ferritin: 513 ng/mL — ABNORMAL HIGH (ref 10–291)

## 2015-02-10 LAB — HEPATIC FUNCTION PANEL
ALT: 42 U/L — AB (ref 0–35)
AST: 87 U/L — AB (ref 0–37)
Albumin: 3 g/dL — ABNORMAL LOW (ref 3.5–5.2)
Alkaline Phosphatase: 130 U/L — ABNORMAL HIGH (ref 39–117)
Bilirubin, Direct: 3.4 mg/dL — ABNORMAL HIGH (ref 0.0–0.3)
Indirect Bilirubin: 3.3 mg/dL — ABNORMAL HIGH (ref 0.2–1.2)
TOTAL PROTEIN: 6.2 g/dL (ref 6.0–8.3)
Total Bilirubin: 6.7 mg/dL — ABNORMAL HIGH (ref 0.2–1.2)

## 2015-02-10 NOTE — Progress Notes (Addendum)
Subjective:    Patient ID: Cynthia Stephenson, female    DOB: Oct 03, 1966, 49 y.o.   MRN: [id number]  HPI Referred to our of office by Dr. Dwana Melena for hepatic cirrhosis. She says she was diagnosed with cirrhosis in 2013 when she had her hysterectomy. Pictures taken but no biopsy.  No tattoos. No IV drug use. No blood transfusions.  No prior hx of jaundice. Has received Hep B vaccination due to her job. Appetite is good. No weight loss.  No abdominal pain. She has a BM daily.  She has not taking the Meloxicam a week ago.  Liver enzymes elevated back in April of 2014.  Started on Keppra in Decemer for new onset of seizures. (She was jaundiced before starting the Keppra per patient.). She was evaluated in [facility]. ( Wll try to locate those records).  12/24/2014 US abdomen:  IMPRESSION: 1. Findings suggesting cirrhosis. No focal hepatic abnormality identified. 2. Hepatosplenomegaly. No evidence of biliary disease.    01/21/2014 MRI Brain w/wo contrast:   IMPRESSION:  Mildly abnormal MRI brain (with and without) demonstrating: 1. Few scattered punctate nonspecific foci of gliosis in the periventricular and some cortical white matter. No abnormal lesions are seen on post contrast views.  2. On coronal views no mesial temporal sclerosis or hippocampal atrophy.  3. Possible small (65mm) choroidal fissure cyst on the right. 4. No acute findings.  CBC    Component Value Date/Time   WBC 4.2 12/22/2014 1913   RBC 3.87 12/22/2014 1913   HGB 12.4 12/22/2014 1913   HCT 36.8 12/22/2014 1913   PLT 92* 12/22/2014 1913   MCV 95.1 12/22/2014 1913   MCH 32.0 12/22/2014 1913   MCHC 33.7 12/22/2014 1913   RDW 16.6* 12/22/2014 1913   LYMPHSABS 1.2 12/22/2014 1913   MONOABS 0.5 12/22/2014 1913   EOSABS 0.1 12/22/2014 1913   BASOSABS 0.0 12/22/2014 1913   12/02/2014 PT 12.9, INR 1.3   03/30/2013 US abdomen: Findings suggesting cirrhosis. No focal hepatic abnormality  identified. Hepatosplenomegaly. No evidence of biliary disease.   Hepatic Function Latest Ref Rng 12/22/2014 03/30/2013  Total Protein 6.0 - 8.3 g/dL 6.8 8.3  Albumin 3.5 - 5.2 g/dL 3.1(L) 4.0  AST 0 - 37 U/L 81(H) 69(H)  ALT 0 - 35 U/L 38(H) 36(H)  Alk Phosphatase 39 - 117 U/L 126(H) 113  Total Bilirubin 0.3 - 1.2 mg/dL 4.0(H) 2.0(H)    12/17/2014 bili 4.4, ALP 125, AST 83, ALT 37, Albumin 3.4 WBC 6.5, H and H 13.4 and 41.0    Review of Systems Past Medical History  Diagnosis Date  . Thyroid disease   . Hypothyroidism   . Seizures     Past Surgical History  Procedure Laterality Date  . Abdominal hysterectomy    . Abdominal hysterectomy      No Known Allergies  Current Outpatient Prescriptions on File Prior to Visit  Medication Sig Dispense Refill  . Cranberry 500 MG CAPS Take by mouth.    . escitalopram (LEXAPRO) 10 MG tablet Take 10 mg by mouth daily.    Marland Kitchen levETIRAcetam (KEPPRA) 500 MG tablet Take 1 tablet (500 mg total) by mouth at bedtime. 90 tablet 1  . levothyroxine (SYNTHROID, LEVOTHROID) 25 MCG tablet Take 1 tablet by mouth every morning.  0  . meloxicam (MOBIC) 15 MG tablet Take 15 mg by mouth daily.    . Multiple Vitamin (ONCE DAILY) TABS Take by mouth. womens daily    . traZODone (DESYREL) 50 MG  tablet Take 50 mg by mouth at bedtime.     No current facility-administered medications on file prior to visit.       married. Three children in good health.  Objective:   Physical ExamBlood pressure 132/82, pulse 72, temperature 97.7 F (36.5 C), height $RemoveBe'5\' 2"'wPjDpDkOR$  (1.575 m), weight 231 lb 12.8 oz (105.144 kg).  Alert and oriented. Skin warm and dry. Oral mucosa is moist.  Skin yellow.  Sclera icteric, conjunctivae is pink. Thyroid not enlarged. No cervical lymphadenopathy. Lungs clear. Heart regular rate and rhythm.  Abdomen is soft. Bowel sounds are positive. No hepatomegaly. No abdominal masses felt. No tenderness.  1+dema to lower extremities.         Assessment  & Plan:  Cirrhosis, with jaundice.Hepatic cancer needs to be ruled out. Autoimmune process also needs to be ruled out.   CBC, Hepatic function, PT/INR, Hep C antibody. SMA, ANA,T abdomen ith CM.  Stop the Meloxicam.  OV in 4 weeks.

## 2015-02-10 NOTE — Patient Instructions (Signed)
Labs, OV in 4 weeks.

## 2015-02-11 ENCOUNTER — Telehealth (INDEPENDENT_AMBULATORY_CARE_PROVIDER_SITE_OTHER): Payer: Self-pay | Admitting: Internal Medicine

## 2015-02-11 ENCOUNTER — Ambulatory Visit (HOSPITAL_COMMUNITY)
Admission: RE | Admit: 2015-02-11 | Discharge: 2015-02-11 | Disposition: A | Discharge status: Home / Self Care | Payer: BLUE CROSS/BLUE SHIELD | Source: Ambulatory Visit | Attending: Internal Medicine | Admitting: Internal Medicine

## 2015-02-11 ENCOUNTER — Other Ambulatory Visit (INDEPENDENT_AMBULATORY_CARE_PROVIDER_SITE_OTHER): Payer: Self-pay | Admitting: Internal Medicine

## 2015-02-11 DIAGNOSIS — K7460 Unspecified cirrhosis of liver: Secondary | ICD-10-CM | POA: Insufficient documentation

## 2015-02-11 DIAGNOSIS — R188 Other ascites: Secondary | ICD-10-CM | POA: Diagnosis not present

## 2015-02-11 DIAGNOSIS — K766 Portal hypertension: Secondary | ICD-10-CM | POA: Diagnosis not present

## 2015-02-11 DIAGNOSIS — R748 Abnormal levels of other serum enzymes: Secondary | ICD-10-CM | POA: Insufficient documentation

## 2015-02-11 DIAGNOSIS — R1013 Epigastric pain: Secondary | ICD-10-CM | POA: Diagnosis present

## 2015-02-11 DIAGNOSIS — R17 Unspecified jaundice: Secondary | ICD-10-CM

## 2015-02-11 LAB — HEPATITIS C ANTIBODY: HCV AB: NEGATIVE

## 2015-02-11 LAB — AFP TUMOR MARKER: AFP-Tumor Marker: 6.4 ng/mL — ABNORMAL HIGH (ref <6.1)

## 2015-02-11 LAB — ANA: Anti Nuclear Antibody(ANA): NEGATIVE

## 2015-02-11 MED ORDER — IOHEXOL 300 MG/ML  SOLN
100.0000 mL | Freq: Once | INTRAMUSCULAR | Status: AC | PRN
  Administered 2015-02-11: 100 mL via INTRAVENOUS

## 2015-02-11 NOTE — Telephone Encounter (Signed)
Am adding these labs. Patient is in CT right now.

## 2015-02-12 ENCOUNTER — Ambulatory Visit: Payer: BLUE CROSS/BLUE SHIELD | Admitting: Diagnostic Neuroimaging

## 2015-02-12 ENCOUNTER — Telehealth (INDEPENDENT_AMBULATORY_CARE_PROVIDER_SITE_OTHER): Payer: Self-pay | Admitting: Internal Medicine

## 2015-02-12 DIAGNOSIS — K7469 Other cirrhosis of liver: Secondary | ICD-10-CM

## 2015-02-12 LAB — PROTIME-INR
INR: 1.37 (ref <1.50)
Prothrombin Time: 16.9 seconds — ABNORMAL HIGH (ref 11.6–15.2)

## 2015-02-12 LAB — SEDIMENTATION RATE: Sed Rate: 15 mm/hr (ref 0–20)

## 2015-02-12 NOTE — Telephone Encounter (Signed)
Liver biopsy has been ordered

## 2015-02-13 ENCOUNTER — Telehealth (INDEPENDENT_AMBULATORY_CARE_PROVIDER_SITE_OTHER): Payer: Self-pay | Admitting: *Deleted

## 2015-02-13 ENCOUNTER — Encounter (INDEPENDENT_AMBULATORY_CARE_PROVIDER_SITE_OTHER): Payer: Self-pay | Admitting: Internal Medicine

## 2015-02-13 LAB — ANTI-SMOOTH MUSCLE ANTIBODY, IGG: Smooth Muscle Ab: 27 U — ABNORMAL HIGH (ref <20)

## 2015-02-13 LAB — CERULOPLASMIN: Ceruloplasmin: 30 mg/dL (ref 18–53)

## 2015-02-13 NOTE — Telephone Encounter (Signed)
Try something OTC

## 2015-02-13 NOTE — Telephone Encounter (Signed)
Cynthia Stephenson is going to have a biopsy but is currently having a lot of sneezing with drainage. Would like to know what she can or if she can take anything for this. The return phone number is [phone].

## 2015-02-16 ENCOUNTER — Other Ambulatory Visit: Payer: Self-pay | Admitting: Radiology

## 2015-02-17 ENCOUNTER — Ambulatory Visit (HOSPITAL_COMMUNITY)
Admission: RE | Admit: 2015-02-17 | Discharge: 2015-02-17 | Disposition: A | Discharge status: Home / Self Care | Payer: BLUE CROSS/BLUE SHIELD | Source: Ambulatory Visit | Attending: Internal Medicine | Admitting: Internal Medicine

## 2015-02-17 DIAGNOSIS — R188 Other ascites: Secondary | ICD-10-CM | POA: Diagnosis not present

## 2015-02-17 DIAGNOSIS — Z79899 Other long term (current) drug therapy: Secondary | ICD-10-CM | POA: Diagnosis not present

## 2015-02-17 DIAGNOSIS — K7469 Other cirrhosis of liver: Secondary | ICD-10-CM | POA: Insufficient documentation

## 2015-02-17 DIAGNOSIS — K7460 Unspecified cirrhosis of liver: Secondary | ICD-10-CM | POA: Insufficient documentation

## 2015-02-17 DIAGNOSIS — K766 Portal hypertension: Secondary | ICD-10-CM | POA: Insufficient documentation

## 2015-02-17 DIAGNOSIS — E039 Hypothyroidism, unspecified: Secondary | ICD-10-CM | POA: Diagnosis not present

## 2015-02-17 DIAGNOSIS — R7989 Other specified abnormal findings of blood chemistry: Secondary | ICD-10-CM | POA: Diagnosis present

## 2015-02-17 LAB — COMPREHENSIVE METABOLIC PANEL
ALBUMIN: 2.7 g/dL — AB (ref 3.5–5.2)
ALK PHOS: 133 U/L — AB (ref 39–117)
ALT: 44 U/L — AB (ref 0–35)
ANION GAP: 6 (ref 5–15)
AST: 93 U/L — AB (ref 0–37)
BILIRUBIN TOTAL: 6.6 mg/dL — AB (ref 0.3–1.2)
BUN: 9 mg/dL (ref 6–23)
CO2: 24 mmol/L (ref 19–32)
Calcium: 8.9 mg/dL (ref 8.4–10.5)
Chloride: 107 mmol/L (ref 96–112)
Creatinine, Ser: 0.55 mg/dL (ref 0.50–1.10)
GFR calc Af Amer: >90 mL/min (ref >90)
GFR calc non Af Amer: >90 mL/min (ref >90)
Glucose, Bld: 97 mg/dL (ref 70–99)
POTASSIUM: 4 mmol/L (ref 3.5–5.1)
Sodium: 137 mmol/L (ref 135–145)
Total Protein: 6.8 g/dL (ref 6.0–8.3)

## 2015-02-17 LAB — CBC WITH DIFFERENTIAL/PLATELET
BASOS ABS: 0.1 10*3/uL (ref 0.0–0.1)
Basophils Relative: 1 % (ref 0–1)
EOS ABS: 0.3 10*3/uL (ref 0.0–0.7)
Eosinophils Relative: 4 % (ref 0–5)
HEMATOCRIT: 35.8 % — AB (ref 36.0–46.0)
Hemoglobin: 12.1 g/dL (ref 12.0–15.0)
Lymphocytes Relative: 30 % (ref 12–46)
Lymphs Abs: 2 10*3/uL (ref 0.7–4.0)
MCH: 30.7 pg (ref 26.0–34.0)
MCHC: 33.8 g/dL (ref 30.0–36.0)
MCV: 90.9 fL (ref 78.0–100.0)
Monocytes Absolute: 0.7 10*3/uL (ref 0.1–1.0)
Monocytes Relative: 10 % (ref 3–12)
Neutro Abs: 3.8 10*3/uL (ref 1.7–7.7)
Neutrophils Relative %: 56 % (ref 43–77)
Platelets: 134 10*3/uL — ABNORMAL LOW (ref 150–400)
RBC: 3.94 MIL/uL (ref 3.87–5.11)
RDW: 18.2 % — AB (ref 11.5–15.5)
WBC: 6.8 10*3/uL (ref 4.0–10.5)

## 2015-02-17 LAB — PROTIME-INR
INR: 1.51 — ABNORMAL HIGH (ref 0.00–1.49)
Prothrombin Time: 18.3 seconds — ABNORMAL HIGH (ref 11.6–15.2)

## 2015-02-17 LAB — ALPHA-1 ANTITRYPSIN PHENOTYPE: A-1 Antitrypsin: 133 mg/dL (ref 83–199)

## 2015-02-17 LAB — APTT: aPTT: 40 seconds — ABNORMAL HIGH (ref 24–37)

## 2015-02-17 MED ORDER — LIDOCAINE HCL (PF) 1 % IJ SOLN
INTRAMUSCULAR | Status: AC
  Filled 2015-02-17: qty 10

## 2015-02-17 MED ORDER — MIDAZOLAM HCL 2 MG/2ML IJ SOLN
INTRAMUSCULAR | Status: AC
  Filled 2015-02-17: qty 4

## 2015-02-17 MED ORDER — SODIUM CHLORIDE 0.9 % IV SOLN
INTRAVENOUS | Status: DC
  Administered 2015-02-17: 13:00:00 via INTRAVENOUS

## 2015-02-17 MED ORDER — FENTANYL CITRATE 0.05 MG/ML IJ SOLN
INTRAMUSCULAR | Status: AC
  Filled 2015-02-17: qty 2

## 2015-02-17 MED ORDER — FENTANYL CITRATE 0.05 MG/ML IJ SOLN
INTRAMUSCULAR | Status: AC | PRN
  Administered 2015-02-17 (×2): 50 ug via INTRAVENOUS

## 2015-02-17 MED ORDER — GELATIN ABSORBABLE 12-7 MM EX MISC
CUTANEOUS | Status: AC
  Filled 2015-02-17: qty 1

## 2015-02-17 MED ORDER — MIDAZOLAM HCL 2 MG/2ML IJ SOLN
INTRAMUSCULAR | Status: AC | PRN
  Administered 2015-02-17 (×2): 1 mg via INTRAVENOUS

## 2015-02-17 NOTE — H&P (Signed)
Chief Complaint: Jaundice, elevated liver function tests  Referring Physician(s): [name],Terri L, NP  History of Present Illness: Cynthia Stephenson is a 49 y.o. 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   Past Medical History  Diagnosis Date  . Thyroid disease   . Hypothyroidism   . Seizures     Past Surgical History  Procedure Laterality Date  . Abdominal hysterectomy    . Abdominal hysterectomy      Allergies: Review of patient's allergies indicates no known allergies.  Medications: Prior to Admission medications   Medication Sig Start Date End Date Taking? Authorizing Provider  escitalopram (LEXAPRO) 10 MG tablet Take 10 mg by mouth daily.   Yes Historical Provider, MD  fexofenadine (ALLEGRA) 180 MG tablet Take 180 mg by mouth daily as needed for allergies or rhinitis.   Yes Historical Provider, MD  levETIRAcetam (KEPPRA) 500 MG tablet Take 1 tablet (500 mg total) by mouth at bedtime. 01/13/15  Yes Suanne Marker, MD  levothyroxine (SYNTHROID, LEVOTHROID) 25 MCG tablet Take 1 tablet by mouth every morning. 12/18/14  Yes Historical Provider, MD  Multiple Vitamin (ONCE DAILY) TABS Take 1 tablet by mouth daily. womens daily   Yes Historical Provider, MD  traZODone (DESYREL) 50 MG tablet Take 50 mg by mouth at bedtime.   Yes Historical Provider, MD    Family History  Problem Relation Age of Onset  . Breast cancer Mother   . Ovarian cancer Mother   . Thyroid cancer Mother   . Diabetes Father   . Heart disease Father   . Skin cancer Father   . Celiac disease Paternal Aunt     History   Social History  . Marital Status: Married    Spouse Name: Loraine Leriche  . Number of Children: 3  . Years of Education: 12   Occupational History  .  Other    [employer]   Social  History Main Topics  . Smoking status: Never Smoker   . Smokeless tobacco: Never Used  . Alcohol Use: No  . Drug Use: No  . Sexual Activity: Not on file   Other Topics Concern  . Not on file   Social History Narrative   Lives at home with Husband    caffeine use: yes      Review of Systems: A 12 point ROS discussed and pertinent positives are indicated in the HPI above.  All other systems are negative.  Review of Systems  only other positives include anxiety, LBP, occ cough  Vital Signs: BP 136/52 mmHg  Pulse 84  Temp(Src) 98.4 F (36.9 C)  Ht  (1.575 m)  Wt 230 lb (104.327 kg)  BMI 42.06 kg/m2  SpO2 98%  Physical Exam  Constitutional: She is oriented to person, place, and time. She appears well-developed and well-nourished.  Eyes: Scleral icterus is present.  Cardiovascular: Normal rate and regular rhythm.   Pulmonary/Chest: Effort normal and breath sounds normal.  Abdominal: Soft. Bowel sounds are normal. There is tenderness.  Musculoskeletal: Normal range of motion. She exhibits edema.  Neurological: She is alert and oriented to person, place, and time.    Imaging: Ct Abdomen W Contrast  02/11/2015   CLINICAL DATA:  Cirrhosis.  Jaundice.  Epigastric pain.  EXAM: CT ABDOMEN WITH CONTRAST  TECHNIQUE: Multidetector CT imaging of the abdomen was performed  using the standard protocol following bolus administration of intravenous contrast.  CONTRAST:  100mL OMNIPAQUE IOHEXOL 300 MG/ML  SOLN  COMPARISON:  03/30/2013  FINDINGS: Lower chest:  Unremarkable.  Hepatobiliary: Hepatic cirrhosis is again demonstrated. No liver masses are identified. Portal veins remain patent. Recanalization of periumbilical veins is consistent with portal venous hypertension. Gallbladder is unremarkable.  Pancreas: No mass, inflammatory changes, or other parenchymal abnormality identified.  Spleen: Spleen remains at the upper limits of normal in size measuring 11 12 cm in length.  Adrenal Glands:   No mass identified.  Kidneys:  No masses identified.  No evidence of hydronephrosis.  Stomach/Bowel/Peritoneum: Visualized portions within the abdomen are unremarkable. Mild perihepatic ascites is seen which is new since previous study.  Vascular/Lymphatic: No pathologically enlarged lymph nodes identified. No other significant abnormality noted.  Other:  None.  Musculoskeletal:  No suspicious bone lesions identified.  IMPRESSION: Hepatic cirrhosis and portal venous hypertension again demonstrated.  Mild perihepatic ascites which is new since previous study.  No evidence of hepatic neoplasm.   Electronically Signed   By: Myles RosenthalJohn  Stahl M.D.   On: 02/11/2015 12:53   Mr Laqueta JeanBrain W ZOWo Contrast  01/22/2015   [facility]  NEUROIMAGING REPORT   STUDY DATE: 01/21/15 PATIENT NAME: Olene CravenLinda Panameno DOB: 1966-08-09 MRN: [id number]  ORDERING CLINICIAN: Joycelyn SchmidVikram Penumalli, MD  CLINICAL HISTORY: 49 year old female with seizures.  EXAM: MRI brain (with and without)  TECHNIQUE: MRI of the brain with and without contrast was obtained  utilizing 5 mm axial slices with T1, T2, T2 flair, SWI and diffusion  weighted views.  T1 sagittal, T2 coronal and postcontrast views in the  axial and coronal plane were obtained. CONTRAST: 20ml  IMAGING SITE: [facility] [address] (1.5 Tesla MRI)    FINDINGS:  No abnormal lesions are seen on diffusion-weighted views to suggest acute  ischemia. The cortical sulci, fissures and cisterns are normal in size and  appearance. Lateral, third and fourth ventricle are normal in size and  appearance. No extra-axial fluid collections are seen. No evidence of mass  effect or midline shift.    Few scattered punctate nonspecific foci of gliosis in the periventricular  and some cortical white matter. No abnormal lesions are seen on post  contrast views.    On coronal views no mesial temporal sclerosis or hippocampal atrophy.  Possible small choroidal fissure cyst on the right.  On  sagittal views the posterior fossa, pituitary gland and corpus callosum  are unremarkable. No evidence of intracranial hemorrhage on SWI views. The  orbits and their contents, paranasal sinuses and calvarium are  unremarkable.  Intracranial flow voids are present.    01/22/2015   Mildly abnormal MRI brain (with and without) demonstrating: 1. Few scattered punctate nonspecific foci of gliosis in the  periventricular and some cortical white matter. No abnormal lesions are  seen on post contrast views.   2. On coronal views no mesial temporal sclerosis or hippocampal atrophy.  3. Possible small (3mm) choroidal fissure cyst on the right. 4. No acute findings.     INTERPRETING PHYSICIAN:  Suanne MarkerVIKRAM R. PENUMALLI, MD Certified in Neurology, Neurophysiology and Neuroimaging  [facility] [address], [address] [place], [place] [address] [phone]    Labs:  CBC:  Recent Labs  12/22/14 1913 02/10/15 0930 02/17/15 1300  WBC 4.2 6.2 6.8  HGB 12.4 12.7 12.1  HCT 36.8 38.4 35.8*  PLT 92* 118* 134*    COAGS:  Recent  Labs  02/11/15 1133  INR 1.37    BMP:  Recent Labs  12/22/14 1913  NA 136  K 4.2  CL 107  CO2 23  GLUCOSE 95  BUN 11  CALCIUM 8.5  CREATININE 0.46*  GFRNONAA >90  GFRAA >90    LIVER FUNCTION TESTS:  Recent Labs  12/22/14 1913 02/10/15 0930  BILITOT 4.0* 6.7*  AST 81* 87*  ALT 38* 42*  ALKPHOS 126* 130*  PROT 6.8 6.2  ALBUMIN 3.1* 3.0*    TUMOR MARKERS:  Recent Labs  02/10/15 0930  AFPTM 6.4*    Assessment and Plan: Cynthia Stephenson is a 50 y.o. 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 All of the patient's  questions were answered, patient is agreeable to proceed. Consent signed and in chart.       Signed: Chinita Pester 02/17/2015, 1:35 PM I spent a total of 20 minutes face to face in clinical consultation, greater than 50% of which was counseling/coordinating care for US guided random liver biopsy.

## 2015-02-17 NOTE — Discharge Instructions (Signed)
Liver Biopsy, Care After °Refer to this sheet in the next few weeks. These instructions provide you with information on caring for yourself after your procedure. Your health care provider may also give you more specific instructions. Your treatment has been planned according to current medical practices, but problems sometimes occur. Call your health care provider if you have any problems or questions after your procedure. °WHAT TO EXPECT AFTER THE PROCEDURE °After your procedure, it is typical to have the following: °· A small amount of discomfort in the area where the biopsy was done and in the right shoulder or shoulder blade. °· A small amount of bruising around the area where the biopsy was done and on the skin over the liver. °· Sleepiness and fatigue for the rest of the day. °HOME CARE INSTRUCTIONS  °· Rest at home for 1-2 days or as directed by your health care provider. °· Have a friend or family member stay with you for at least 24 hours. °· Because of the medicines used during the procedure, you should not do the following things in the first 24 hours: °¨ Drive. °¨ Use machinery. °¨ Be responsible for the care of other people. °¨ Sign legal documents. °¨ Take a bath or shower. °· There are many different ways to close and cover an incision, including stitches, skin glue, and adhesive strips. Follow your health care provider's instructions on: °¨ Incision care. °¨ Bandage (dressing) changes and removal. °¨ Incision closure removal. °· Do not drink alcohol in the first week. °· Do not lift more than 5 pounds or play contact sports for 2 weeks after this test. °· Take medicines only as directed by your health care provider. Do not take medicine containing aspirin or non-steroidal anti-inflammatory medicines such as ibuprofen for 1 week after this test. °· It is your responsibility to get your test results. °SEEK MEDICAL CARE IF:  °· You have increased bleeding from an incision that results in more than a  small spot of blood. °· You have redness, swelling, or increasing pain in any incisions. °· You notice a discharge or a bad smell coming from any of your incisions. °· You have a fever or chills. °SEEK IMMEDIATE MEDICAL CARE IF:  °· You develop swelling, bloating, or pain in your abdomen. °· You become dizzy or faint. °· You develop a rash. °· You are nauseous or vomit. °· You have difficulty breathing, feel short of breath, or feel faint. °· You develop chest pain. °· You have problems with your speech or vision. °· You have trouble balancing or moving your arms or legs. °Document Released: 06/17/2005 Document Revised: 04/14/2014 Document Reviewed: 01/24/2014 °ExitCare® Patient Information ©2015 [employer]. This information is not intended to replace advice given to you by your health care provider. Make sure you discuss any questions you have with your health care provider. ° °

## 2015-02-17 NOTE — Sedation Documentation (Signed)
Bandaid RUQ dry, intact

## 2015-02-17 NOTE — Procedures (Signed)
Procedure:  Ultrasound guided liver biopsy Findings:  Cirrhotic appearing liver.  Solid tissue obtained from right lobe.  Gelfoam pledgets advanced through needle on completion. No complications. EBL<25 mL.

## 2015-02-17 NOTE — Progress Notes (Signed)
Patient stated a pain level of 4 and Jeananne RamaKevin Allred was notified that patient"s mother was concerned of what to do if the pain got out of hand during the night and wanted to know what the patient could take including possibly pain med prescription to take home.  Patient was told per Jeananne RamaKevin Allred that if pain was unbearable then call [facility] or on call team and Patient may end up staying the night.  No pain meds were ordered because pain  meds can mask complications and patient instructed to call 911 or on call number written on sheet at discharge.  Patient stated pain was less then a four at time of discharge and ice was applied per Jeananne RamaKevin Allred as document on pain assessment in Patient record.

## 2015-02-20 ENCOUNTER — Ambulatory Visit (INDEPENDENT_AMBULATORY_CARE_PROVIDER_SITE_OTHER): Payer: BLUE CROSS/BLUE SHIELD | Admitting: Diagnostic Neuroimaging

## 2015-02-20 ENCOUNTER — Encounter: Payer: Self-pay | Admitting: Diagnostic Neuroimaging

## 2015-02-20 VITALS — BP 123/69 | HR 79 | Ht 62.0 in | Wt 237.2 lb

## 2015-02-20 DIAGNOSIS — G40909 Epilepsy, unspecified, not intractable, without status epilepticus: Secondary | ICD-10-CM | POA: Diagnosis not present

## 2015-02-20 DIAGNOSIS — R569 Unspecified convulsions: Secondary | ICD-10-CM | POA: Diagnosis not present

## 2015-02-20 MED ORDER — LEVETIRACETAM 500 MG PO TABS: 500.0000 mg | ORAL_TABLET | Freq: Two times a day (BID) | ORAL | Status: DC

## 2015-02-20 NOTE — Progress Notes (Signed)
[facility]  PATIENT: Cynthia CravenLinda Stephenson DOB: September 27, 1966  REFERRING CLINICIAN: Z Hall HISTORY FROM: patient REASON FOR VISIT: follow up  HISTORICAL  CHIEF COMPLAINT:  Chief Complaint  Patient presents with  . Follow-up    SEIZURES     HISTORY OF PRESENT ILLNESS:   UPDATE 02/20/15 (VRP): Since last visit, had 1 more event on Jan 12, 2015, [place] in [place], and then woke up on the ground without realizing or knowing how that happened. She had no significant injuries, tongue biting or incontinence. She thinks she may have had a seizure or some passing out episode. She did not tell anyone about this event. Patient continues on levetiracetam 500 mg at bedtime. She is also had liver biopsy couple days ago but does not have the results. Continues to have fatigue. Does not think she can work anymore.  PRIOR HPI (12/26/14): 49 year old right-handed female here for evaluation of possible seizure. 12/02/2014, patient was feeling badly, had previous day of diarrhea. Patient left work early. After her husband came home they went shopping. While at the store patient had episode of staring, eyes rolling back, falling to the ground, convulsions for 1 minute with tongue biting and incontinence. This was witnessed by patient's husband. Bystanders also witnessed. Apparently a nurse was a bystander and.patient was having a seizure. Per medics were called to scene evaluated patient and took her to the local hospital. Patient apparently was talking, confused, slow to respond after the event. Patient has no memory until arriving in the emergency room. Patient was admitted overnight, had CT scan the blood testing, diagnosed with urinary tract infection, and discharged on level at levetiracetam 500 mg at bedtime. Patient has family history of seizure in maternal grandfather and maternal uncle. No history of head trauma, encephalitis or meningitis. Also of note patient has been having  some abdominal pain over past 1-2 years. Patient has had elevated LFTs in the past. Recently blood work demonstrates elevated bilirubin, low platelet levels, elevated alkaline phosphatase. Patient has noted easy bruising and bleeding lately.   REVIEW OF SYSTEMS: Full 14 system review of systems performed and notable only for memory loss confusion headache sleepiness snoring dizziness feeling cold increased thirst snoring easy bruising easy bleeding weight gain fevers chills fatigue itching.  ALLERGIES: No Known Allergies  HOME MEDICATIONS: Outpatient Prescriptions Prior to Visit  Medication Sig Dispense Refill  . escitalopram (LEXAPRO) 10 MG tablet Take 10 mg by mouth daily.    . fexofenadine (ALLEGRA) 180 MG tablet Take 180 mg by mouth daily as needed for allergies or rhinitis.    Marland Kitchen. levothyroxine (SYNTHROID, LEVOTHROID) 25 MCG tablet Take 1 tablet by mouth every morning.  0  . Multiple Vitamin (ONCE DAILY) TABS Take 1 tablet by mouth daily. womens daily    . traZODone (DESYREL) 50 MG tablet Take 50 mg by mouth at bedtime.    . levETIRAcetam (KEPPRA) 500 MG tablet Take 1 tablet (500 mg total) by mouth at bedtime. 90 tablet 1   No facility-administered medications prior to visit.    PAST MEDICAL HISTORY: Past Medical History  Diagnosis Date  . Thyroid disease   . Hypothyroidism   . Seizures     PAST SURGICAL HISTORY: Past Surgical History  Procedure Laterality Date  . Abdominal hysterectomy    . Abdominal hysterectomy    . Liver biopsy      FAMILY HISTORY: Family History  Problem Relation Age of Onset  . Breast cancer Mother   .  Ovarian cancer Mother   . Thyroid cancer Mother   . Diabetes Father   . Heart disease Father   . Skin cancer Father   . Celiac disease Paternal Aunt     SOCIAL HISTORY:  History   Social History  . Marital Status: Married    Spouse Name: Loraine Leriche  . Number of Children: 3  . Years of Education: 12   Occupational History  .  Other     [employer]   Social History Main Topics  . Smoking status: Never Smoker   . Smokeless tobacco: Never Used  . Alcohol Use: No  . Drug Use: No  . Sexual Activity: Not on file   Other Topics Concern  . Not on file   Social History Narrative   Lives at home with Husband    caffeine use: yes     PHYSICAL EXAM  Filed Vitals:   02/20/15 1104  BP: 123/69  Pulse: 79  Height:  (1.575 m)  Weight: 237 lb 3.2 oz (107.593 kg)    Body mass index is 43.37 kg/(m^2).  No exam data present  No flowsheet data found.  GENERAL EXAM: Patient is in no distress; well developed, nourished and groomed; neck is supple; SCLERAL ICTERUS  CARDIOVASCULAR: Regular rate and rhythm, no murmurs, no carotid bruits  NEUROLOGIC: MENTAL STATUS: awake, alert, language fluent, comprehension intact, naming intact, fund of knowledge appropriate CRANIAL NERVE: no papilledema on fundoscopic exam, pupils equal and reactive to light, visual fields full to confrontation, extraocular muscles intact, no nystagmus, facial sensation and strength symmetric, hearing intact, palate elevates symmetrically, uvula midline, shoulder shrug symmetric, tongue midline. MOTOR: normal bulk and tone, full strength in the BUE, BLE; POSTURAL AND ACTION TREMOR IN BUE SENSORY: normal and symmetric to light touch, temperature, vibration COORDINATION: finger-nose-finger, fine finger movements normal REFLEXES: deep tendon reflexes present and symmetric GAIT/STATION: narrow based gait; able to walk on toes, heels and tandem; romberg is negative    DIAGNOSTIC DATA (LABS, IMAGING, TESTING) - I reviewed patient records, labs, notes, testing and imaging myself where available.  Lab Results  Component Value Date   WBC 6.8 02/17/2015   HGB 12.1 02/17/2015   HCT 35.8* 02/17/2015   MCV 90.9 02/17/2015   PLT 134* 02/17/2015      Component Value Date/Time   NA 137 02/17/2015 1300   K  4.0 02/17/2015 1300   CL 107 02/17/2015 1300   CO2 24 02/17/2015 1300   GLUCOSE 97 02/17/2015 1300   BUN 9 02/17/2015 1300   CREATININE 0.55 02/17/2015 1300   CALCIUM 8.9 02/17/2015 1300   PROT 6.8 02/17/2015 1300   ALBUMIN 2.7* 02/17/2015 1300   AST 93* 02/17/2015 1300   ALT 44* 02/17/2015 1300   ALKPHOS 133* 02/17/2015 1300   BILITOT 6.6* 02/17/2015 1300   GFRNONAA >90 02/17/2015 1300   GFRAA >90 02/17/2015 1300   No results found for: CHOL, HDL, LDLCALC, LDLDIRECT, TRIG, CHOLHDL No results found for: AVWU9W No results found for: VITAMINB12 No results found for: TSH  I reviewed images myself and agree with interpretation. -VRP  01/21/15 MRI brain  1. Few scattered punctate nonspecific foci of gliosis in the periventricular and some cortical white matter. No abnormal lesions are seen on post contrast views.  2. On coronal views no mesial temporal sclerosis or hippocampal atrophy.  3. Possible small (3mm) choroidal fissure cyst on the right. 4. No acute findings.  12/29/14 EEG - normal  ASSESSMENT AND PLAN  49 y.o. year old female here with new onset seizure 12/02/2014, in setting of urinary tract infection, diarrheal illness. Also with thrombocytopenia and hyperbilirubinemia, unclear etiology, s/p liver biopsy. Now with second event of syncope vs seizure on 01/12/15.    PLAN: - increase LEV to  BID - no driving for 6 months until she is seizure free - follow-up with PCP and GI regarding platelet and liver function abnormalities  Meds ordered this encounter  Medications  . levETIRAcetam (KEPPRA) 500 MG tablet    Sig: Take 1 tablet (500 mg total) by mouth 2 (two) times daily.    Dispense:  180 tablet    Refill:  4   Return in about 3 months (around 05/23/2015).    Suanne Marker, MD 02/20/2015, 11:41 AM Certified in Neurology, Neurophysiology and Neuroimaging  [facility] [address] [place], [place] [address] [phone]

## 2015-02-24 ENCOUNTER — Ambulatory Visit (INDEPENDENT_AMBULATORY_CARE_PROVIDER_SITE_OTHER): Payer: BLUE CROSS/BLUE SHIELD | Admitting: Internal Medicine

## 2015-02-24 ENCOUNTER — Other Ambulatory Visit (INDEPENDENT_AMBULATORY_CARE_PROVIDER_SITE_OTHER): Payer: Self-pay | Admitting: Internal Medicine

## 2015-02-24 ENCOUNTER — Encounter (INDEPENDENT_AMBULATORY_CARE_PROVIDER_SITE_OTHER): Payer: Self-pay | Admitting: Internal Medicine

## 2015-02-24 ENCOUNTER — Encounter (INDEPENDENT_AMBULATORY_CARE_PROVIDER_SITE_OTHER): Payer: Self-pay | Admitting: *Deleted

## 2015-02-24 VITALS — BP 144/76 | HR 80 | Temp 97.9°F | Ht 62.0 in | Wt 233.3 lb

## 2015-02-24 DIAGNOSIS — K7460 Unspecified cirrhosis of liver: Secondary | ICD-10-CM

## 2015-02-24 DIAGNOSIS — R17 Unspecified jaundice: Secondary | ICD-10-CM

## 2015-02-24 MED ORDER — URSODIOL 250 MG PO TABS
250.0000 mg | ORAL_TABLET | Freq: Three times a day (TID) | ORAL | Status: DC
Start: 2015-02-24 — End: 2015-03-23

## 2015-02-24 MED ORDER — FUROSEMIDE 20 MG PO TABS: 20.0000 mg | ORAL_TABLET | ORAL | Status: DC

## 2015-02-24 MED ORDER — LORAZEPAM 0.5 MG PO TABS: 0.5000 mg | ORAL_TABLET | ORAL | Status: DC | PRN

## 2015-02-24 NOTE — Progress Notes (Signed)
Subjective:    Patient ID: Cynthia Stephenson, female    DOB: Jun 05, 1966, 49 y.o.   MRN: [id number]  HPI Here today for f/u of her elevated liver enzymes. Recent hx of non-alcoholic cirrhosis. She was diagnosed in 2013 with cirrhosis when she had a hysterectomy.  She has received Hep B vaccination due to her job. Prior liver enzymes in April of 2014 were elevated. No prior hx of jaundice. No etoh abuse. Cynthia Stephenson underwent a liver biopsy in March of 2016 which revealed cholestatic liver injury with features of cirrhosis. Trichrome stain demonstrates nodular cirrhosis with broad zones of parenchymal dropout. Iron stain demonstrates 2+hemosiderin deposition. PAS positive intrahepatic deposits present.  Dr. Karilyn Cotaehman in with patient to discuss plan of care.  Has been on Ampicillin and Augmentin for dental work. In the past has taken Tylenol PM (2) at night.  02/11/2015 AFP 6.4, alpha 1 antitrypsin 133, PT/INR 16.9 and 1.37, sed rate 15, Ceruloplasmin 30, Hepatitis B surface antigen negative, Hepatitis C antibody negative. SMA 27, ANA negative.  CMP     Component Value Date/Time   NA 137 02/17/2015 1300   K 4.0 02/17/2015 1300   CL 107 02/17/2015 1300   CO2 24 02/17/2015 1300   GLUCOSE 97 02/17/2015 1300   BUN 9 02/17/2015 1300   CREATININE 0.55 02/17/2015 1300   CALCIUM 8.9 02/17/2015 1300   PROT 6.8 02/17/2015 1300   ALBUMIN 2.7* 02/17/2015 1300   AST 93* 02/17/2015 1300   ALT 44* 02/17/2015 1300   ALKPHOS 133* 02/17/2015 1300   BILITOT 6.6* 02/17/2015 1300   GFRNONAA >90 02/17/2015 1300   GFRAA >90 02/17/2015 1300        12/24/2014 US abdomen:   IMPRESSION: 1. Findings suggesting cirrhosis. No focal hepatic abnormality identified. 2. Hepatosplenomegaly.  No evidence of biliary disease.  Review of Systems Past Medical History  Diagnosis Date  . Thyroid disease   . Hypothyroidism   . Seizures     Past Surgical History  Procedure Laterality Date  . Abdominal hysterectomy    .  Abdominal hysterectomy    . Liver biopsy      No Known Allergies  Current Outpatient Prescriptions on File Prior to Visit  Medication Sig Dispense Refill  . escitalopram (LEXAPRO) 10 MG tablet Take 10 mg by mouth daily.    . fexofenadine (ALLEGRA) 180 MG tablet Take 180 mg by mouth daily as needed for allergies or rhinitis.    Marland Kitchen. levETIRAcetam (KEPPRA) 500 MG tablet Take 1 tablet (500 mg total) by mouth 2 (two) times daily. (Patient taking differently: Take 1,000 mg by mouth 2 (two) times daily. ) 180 tablet 4  . levothyroxine (SYNTHROID, LEVOTHROID) 25 MCG tablet Take 1 tablet by mouth every morning.  0  . Multiple Vitamin (ONCE DAILY) TABS Take 1 tablet by mouth daily. womens daily    . traZODone (DESYREL) 50 MG tablet Take 50 mg by mouth at bedtime.     No current facility-administered medications on file prior to visit.        Objective:   Physical Exam Blood pressure 144/76, pulse 80, temperature 97.9 F (36.6 C), height 5\' 2"  (1.575 m), weight 233 lb 4.8 oz (105.824 kg). Alert and oriented. Skin warm and dry. Oral mucosa is moist.   . Sclera icteric, conjunctivae is pink. Thyroid not enlarged. No cervical lymphadenopathy. Lungs clear. Heart regular rate and rhythm.  Abdomen is soft. Bowel sounds are positive. No hepatomegaly. No abdominal masses felt. No tenderness.  1+  edema to lower extremities.          Assessment & Plan:  Cirrhosis, jaundice. Plan: Will start on URSO. MRCP this week. If numbers are not getting better, will refer to [facility]. Hepatic function today.  Urso  TID, Lasix  every 3rd day, Ativan 0.5mg  at night as needed. OV in 4 weeks. Hepatic function today.  Needs to diet and exercise and this was stressed to patient and daughter. Stop the Trazodone.

## 2015-02-24 NOTE — Patient Instructions (Addendum)
  Stops the Trazodone uroso 250mg  TID, Ativan 0.25mg  hs as night.  OV in 4 weeks Dx bile duct injury

## 2015-02-25 ENCOUNTER — Telehealth (INDEPENDENT_AMBULATORY_CARE_PROVIDER_SITE_OTHER): Payer: Self-pay | Admitting: *Deleted

## 2015-02-25 LAB — TSH: TSH: 8.408 u[IU]/mL — AB (ref 0.350–4.500)

## 2015-02-25 LAB — HEPATIC FUNCTION PANEL
ALT: 34 U/L (ref 0–35)
AST: 80 U/L — AB (ref 0–37)
Albumin: 2.6 g/dL — ABNORMAL LOW (ref 3.5–5.2)
Alkaline Phosphatase: 132 U/L — ABNORMAL HIGH (ref 39–117)
BILIRUBIN DIRECT: 2.9 mg/dL — AB (ref 0.0–0.3)
Indirect Bilirubin: 2.7 mg/dL — ABNORMAL HIGH (ref 0.2–1.2)
Total Bilirubin: 5.6 mg/dL — ABNORMAL HIGH (ref 0.2–1.2)
Total Protein: 6 g/dL (ref 6.0–8.3)

## 2015-02-25 LAB — T4, FREE: Free T4: 0.95 ng/dL (ref 0.80–1.80)

## 2015-02-25 NOTE — Telephone Encounter (Signed)
She has an Rx for Ativan

## 2015-02-25 NOTE — Telephone Encounter (Signed)
MR sch'd 02/27/15 at 8 pm, she is a little claustrophobic and wants to know if you can give her something to take to help her with this -- (I think Dr Karilyn Cotaehman will sometimes uses ativan) -- [facility] -- please call patient if you are able to do this, [phone]

## 2015-02-26 ENCOUNTER — Telehealth (INDEPENDENT_AMBULATORY_CARE_PROVIDER_SITE_OTHER): Payer: Self-pay | Admitting: *Deleted

## 2015-02-26 DIAGNOSIS — K7469 Other cirrhosis of liver: Secondary | ICD-10-CM

## 2015-02-26 NOTE — Telephone Encounter (Signed)
.  Per Delrae Renderri [name],NP patient is to have lab work 03/03/15.

## 2015-02-27 ENCOUNTER — Ambulatory Visit (HOSPITAL_COMMUNITY)
Admission: RE | Admit: 2015-02-27 | Discharge: 2015-02-27 | Disposition: A | Discharge status: Home / Self Care | Payer: BLUE CROSS/BLUE SHIELD | Source: Ambulatory Visit | Attending: Internal Medicine | Admitting: Internal Medicine

## 2015-02-27 DIAGNOSIS — R17 Unspecified jaundice: Secondary | ICD-10-CM

## 2015-02-27 DIAGNOSIS — K7460 Unspecified cirrhosis of liver: Secondary | ICD-10-CM

## 2015-03-02 ENCOUNTER — Telehealth (INDEPENDENT_AMBULATORY_CARE_PROVIDER_SITE_OTHER): Payer: Self-pay | Admitting: *Deleted

## 2015-03-02 DIAGNOSIS — R748 Abnormal levels of other serum enzymes: Secondary | ICD-10-CM

## 2015-03-02 DIAGNOSIS — K7460 Unspecified cirrhosis of liver: Secondary | ICD-10-CM

## 2015-03-02 DIAGNOSIS — R17 Unspecified jaundice: Secondary | ICD-10-CM

## 2015-03-02 NOTE — Telephone Encounter (Signed)
Phone call from patient stating she wasn't able to do MR (MRCP) due to machine not being wide enough -- [facility], please place order and I'll do new authorization and schedule,

## 2015-03-03 ENCOUNTER — Encounter (INDEPENDENT_AMBULATORY_CARE_PROVIDER_SITE_OTHER): Payer: Self-pay

## 2015-03-04 ENCOUNTER — Encounter (INDEPENDENT_AMBULATORY_CARE_PROVIDER_SITE_OTHER): Payer: Self-pay

## 2015-03-04 LAB — HEPATIC FUNCTION PANEL
ALK PHOS: 127 U/L — AB (ref 39–117)
ALT: 33 U/L (ref 0–35)
AST: 83 U/L — AB (ref 0–37)
Albumin: 2.7 g/dL — ABNORMAL LOW (ref 3.5–5.2)
BILIRUBIN INDIRECT: 3.1 mg/dL — AB (ref 0.2–1.2)
Bilirubin, Direct: 4.4 mg/dL — ABNORMAL HIGH (ref 0.0–0.3)
Total Bilirubin: 7.5 mg/dL — ABNORMAL HIGH (ref 0.2–1.2)
Total Protein: 6 g/dL (ref 6.0–8.3)

## 2015-03-05 ENCOUNTER — Telehealth (INDEPENDENT_AMBULATORY_CARE_PROVIDER_SITE_OTHER): Payer: Self-pay | Admitting: *Deleted

## 2015-03-05 ENCOUNTER — Encounter (INDEPENDENT_AMBULATORY_CARE_PROVIDER_SITE_OTHER): Payer: Self-pay | Admitting: Internal Medicine

## 2015-03-05 NOTE — Telephone Encounter (Signed)
Cynthia Stephenson ClientHannah, daughter, would like to speak with Terri. Please return her call to [phone].

## 2015-03-05 NOTE — Telephone Encounter (Signed)
I have talked with patient. She is going to hold Lasix today

## 2015-03-09 NOTE — Telephone Encounter (Signed)
Needs Hepatic function in 1 week. I have talked with patient.

## 2015-03-10 ENCOUNTER — Encounter (INDEPENDENT_AMBULATORY_CARE_PROVIDER_SITE_OTHER): Payer: Self-pay | Admitting: Internal Medicine

## 2015-03-10 ENCOUNTER — Ambulatory Visit (INDEPENDENT_AMBULATORY_CARE_PROVIDER_SITE_OTHER): Payer: BLUE CROSS/BLUE SHIELD | Admitting: Internal Medicine

## 2015-03-16 ENCOUNTER — Ambulatory Visit (HOSPITAL_COMMUNITY)
Admission: RE | Admit: 2015-03-16 | Discharge: 2015-03-16 | Disposition: A | Discharge status: Home / Self Care | Payer: BLUE CROSS/BLUE SHIELD | Source: Ambulatory Visit | Attending: Internal Medicine | Admitting: Internal Medicine

## 2015-03-16 ENCOUNTER — Encounter (INDEPENDENT_AMBULATORY_CARE_PROVIDER_SITE_OTHER): Payer: Self-pay | Admitting: Internal Medicine

## 2015-03-16 ENCOUNTER — Other Ambulatory Visit (INDEPENDENT_AMBULATORY_CARE_PROVIDER_SITE_OTHER): Payer: Self-pay | Admitting: Internal Medicine

## 2015-03-16 DIAGNOSIS — R748 Abnormal levels of other serum enzymes: Secondary | ICD-10-CM | POA: Diagnosis not present

## 2015-03-16 DIAGNOSIS — K7460 Unspecified cirrhosis of liver: Secondary | ICD-10-CM

## 2015-03-16 DIAGNOSIS — K760 Fatty (change of) liver, not elsewhere classified: Secondary | ICD-10-CM | POA: Insufficient documentation

## 2015-03-16 DIAGNOSIS — R17 Unspecified jaundice: Secondary | ICD-10-CM | POA: Insufficient documentation

## 2015-03-16 DIAGNOSIS — R1013 Epigastric pain: Secondary | ICD-10-CM | POA: Insufficient documentation

## 2015-03-16 MED ORDER — GADOBENATE DIMEGLUMINE 529 MG/ML IV SOLN
20.0000 mL | Freq: Once | INTRAVENOUS | Status: AC
  Administered 2015-03-16: 20 mL via INTRAVENOUS

## 2015-03-17 ENCOUNTER — Encounter (INDEPENDENT_AMBULATORY_CARE_PROVIDER_SITE_OTHER): Payer: Self-pay | Admitting: Internal Medicine

## 2015-03-17 ENCOUNTER — Telehealth (INDEPENDENT_AMBULATORY_CARE_PROVIDER_SITE_OTHER): Payer: Self-pay | Admitting: *Deleted

## 2015-03-17 ENCOUNTER — Ambulatory Visit (INDEPENDENT_AMBULATORY_CARE_PROVIDER_SITE_OTHER): Payer: BLUE CROSS/BLUE SHIELD | Admitting: Internal Medicine

## 2015-03-17 VITALS — BP 108/58 | HR 72 | Temp 97.8°F | Ht 62.0 in | Wt 252.2 lb

## 2015-03-17 DIAGNOSIS — K760 Fatty (change of) liver, not elsewhere classified: Secondary | ICD-10-CM | POA: Diagnosis not present

## 2015-03-17 DIAGNOSIS — R188 Other ascites: Secondary | ICD-10-CM | POA: Diagnosis not present

## 2015-03-17 LAB — COMPREHENSIVE METABOLIC PANEL
ALK PHOS: 126 U/L — AB (ref 39–117)
ALT: 35 U/L (ref 0–35)
AST: 73 U/L — AB (ref 0–37)
Albumin: 2.5 g/dL — ABNORMAL LOW (ref 3.5–5.2)
BUN: 12 mg/dL (ref 6–23)
CALCIUM: 8 mg/dL — AB (ref 8.4–10.5)
CO2: 22 mEq/L (ref 19–32)
CREATININE: 0.55 mg/dL (ref 0.50–1.10)
Chloride: 103 mEq/L (ref 96–112)
Glucose, Bld: 91 mg/dL (ref 70–99)
POTASSIUM: 3.9 meq/L (ref 3.5–5.3)
Sodium: 135 mEq/L (ref 135–145)
Total Bilirubin: 10.4 mg/dL — ABNORMAL HIGH (ref 0.2–1.2)
Total Protein: 5.7 g/dL — ABNORMAL LOW (ref 6.0–8.3)

## 2015-03-17 LAB — CBC
HCT: 33.6 % — ABNORMAL LOW (ref 36.0–46.0)
Hemoglobin: 11.7 g/dL — ABNORMAL LOW (ref 12.0–15.0)
MCH: 31.3 pg (ref 26.0–34.0)
MCHC: 34.8 g/dL (ref 30.0–36.0)
MCV: 89.8 fL (ref 78.0–100.0)
MPV: 9.8 fL (ref 8.6–12.4)
Platelets: 146 10*3/uL — ABNORMAL LOW (ref 150–400)
RBC: 3.74 MIL/uL — ABNORMAL LOW (ref 3.87–5.11)
RDW: 20.5 % — AB (ref 11.5–15.5)
WBC: 6.8 10*3/uL (ref 4.0–10.5)

## 2015-03-17 MED ORDER — SPIRONOLACTONE 100 MG PO TABS: 100.0000 mg | ORAL_TABLET | Freq: Every day | ORAL | Status: DC

## 2015-03-17 NOTE — Patient Instructions (Signed)
OV in one month

## 2015-03-17 NOTE — Telephone Encounter (Signed)
Please call patient's daughter Dahlia ClientHannah @ [phone], she has a question about her mom

## 2015-03-17 NOTE — Progress Notes (Signed)
Subjective:    Patient ID: Cynthia Stephenson, female    DOB: 12-05-1966, 49 y.o.   MRN: [id number]  HPI Here today for f/u. She was last see February 23, 2014 and started on Urso  TID. Her weight  In March was 233. She now weights 254.2. She has moderate ascites on MR on 03/16/2015.    She was diagnosed in 2013 with cirrhosis when she had a hysterectomy. She has received Hep B vaccination due to her job. Prior liver enzymes in April of 2014 were elevated. No prior hx of jaundice. No etoh abuse. [name] underwent a liver biopsy in March of 2016 which revealed cholestatic liver injury with features of cirrhosis. Trichrome stain demonstrates nodular cirrhosis with broad zones of parenchymal dropout. Iron stain demonstrates 2+hemosiderin deposition. PAS positive intrahepatic deposits present.  Dr. Karilyn Cota in with patient to discuss plan of care.  Has been on Ampicillin and Augmentin for dental work. In the past has taken Tylenol PM (2) at night.  02/11/2015 AFP 6.4, alpha 1 antitrypsin 133, PT/INR 16.9 and 1.37, sed rate 15, Ceruloplasmin 30, Hepatitis B surface antigen negative, Hepatitis C antibody negative. SMA 27, ANA negative.  Her appetite is good. She usually has a BM once a day.   03/16/2015 MR abdomen:   IMPRESSION: 1. Exam is limited by technique with loss of signal centrally in the abdomen on many sequences. 2. Morphologic changes of liver consists with cirrhosis. No enhancing lesions to suggests hepatoma. Portal veins are patent. 3. Moderate volume ascites. 4. No gross evidence of biliary obstruction. 5. Pancreas appears normal. 6. Evidence of portal hypertension with recanalization of the umbilical vein  Hepatic Function Panel     Component Value Date/Time   PROT 6.0 03/03/2015 1033   ALBUMIN 2.7* 03/03/2015 1033   AST 83* 03/03/2015 1033   ALT 33 03/03/2015 1033   ALKPHOS 127* 03/03/2015 1033   BILITOT 7.5* 03/03/2015 1033   BILIDIR 4.4* 03/03/2015 1033   IBILI 3.1*  03/03/2015 1033    CBC    Component Value Date/Time   WBC 6.8 02/17/2015 1300   RBC 3.94 02/17/2015 1300   HGB 12.1 02/17/2015 1300   HCT 35.8* 02/17/2015 1300   PLT 134* 02/17/2015 1300   MCV 90.9 02/17/2015 1300   MCH 30.7 02/17/2015 1300   MCHC 33.8 02/17/2015 1300   RDW 18.2* 02/17/2015 1300   LYMPHSABS 2.0 02/17/2015 1300   MONOABS 0.7 02/17/2015 1300   EOSABS 0.3 02/17/2015 1300   BASOSABS 0.1 02/17/2015 1300      Review of Systems Past Medical History  Diagnosis Date  . Thyroid disease   . Hypothyroidism   . Seizures     Past Surgical History  Procedure Laterality Date  . Abdominal hysterectomy    . Abdominal hysterectomy    . Liver biopsy      No Known Allergies  Current Outpatient Prescriptions on File Prior to Visit  Medication Sig Dispense Refill  . fexofenadine (ALLEGRA) 180 MG tablet Take 180 mg by mouth daily as needed for allergies or rhinitis.    . furosemide (LASIX) 20 MG tablet Take 1 tablet (20 mg total) by mouth See admin instructions. 30 tablet 3  . levETIRAcetam (KEPPRA) 500 MG tablet Take 1 tablet (500 mg total) by mouth 2 (two) times daily. (Patient taking differently: Take 1,000 mg by mouth 2 (two) times daily. ) 180 tablet 4  . levothyroxine (SYNTHROID, LEVOTHROID) 25 MCG tablet Take 1 tablet by mouth every morning.  0  .  LORazepam (ATIVAN) 0.5 MG tablet Take 1 tablet (0.5 mg total) by mouth as needed for anxiety. 30 tablet 0  . Multiple Vitamin (ONCE DAILY) TABS Take 1 tablet by mouth daily. womens daily    . ursodiol (URSO) 250 MG tablet Take 1 tablet (250 mg total) by mouth 3 (three) times daily. 90 tablet 4   No current facility-administered medications on file prior to visit.        Objective:   Physical Exam Blood pressure 108/58, pulse 72, temperature 97.8 F (36.6 C), height 5\' 2"  (1.575 m), weight 252 lb 3.2 oz (114.397 kg).  Alert and oriented. Skin warm and dry. Oral mucosa is moist.  Skin is yellow . Sclera icteric,  conjunctivae is pink. Thyroid not enlarged. No cervical lymphadenopathy. Lungs clear. Heart regular rate and rhythm.  Abdomen distended but not tense. . Bowel sounds are positive. No hepatomegaly. No abdominal masses felt. No tenderness.  2+ edema to lower extremities. Compression hose in place      Assessment & Plan:  NAFLD with new ascites. Bilirubin is climbing. I discussed this case with Dr. Karilyn Cotaehman. If bilirubin is climbing today, [facility]  CMET and CBC today.

## 2015-03-18 LAB — PROTIME-INR
INR: 1.62 — ABNORMAL HIGH (ref <1.50)
PROTHROMBIN TIME: 19.2 s — AB (ref 11.6–15.2)

## 2015-03-18 NOTE — Telephone Encounter (Signed)
I have talked with Bonita QuinLinda this morning. She is going to be referred [facility].

## 2015-03-18 NOTE — Telephone Encounter (Signed)
Referral & [facility]

## 2015-03-19 ENCOUNTER — Emergency Department (HOSPITAL_COMMUNITY): Payer: BLUE CROSS/BLUE SHIELD

## 2015-03-19 ENCOUNTER — Encounter (INDEPENDENT_AMBULATORY_CARE_PROVIDER_SITE_OTHER): Payer: Self-pay | Admitting: Internal Medicine

## 2015-03-19 ENCOUNTER — Encounter (HOSPITAL_COMMUNITY): Payer: Self-pay | Admitting: Emergency Medicine

## 2015-03-19 ENCOUNTER — Inpatient Hospital Stay (HOSPITAL_COMMUNITY)
Admission: EM | Admit: 2015-03-19 | Discharge: 2015-03-23 | DRG: 433 | Disposition: A | Discharge status: Home / Self Care | Payer: BLUE CROSS/BLUE SHIELD | Attending: Internal Medicine | Admitting: Internal Medicine

## 2015-03-19 DIAGNOSIS — R011 Cardiac murmur, unspecified: Secondary | ICD-10-CM | POA: Diagnosis present

## 2015-03-19 DIAGNOSIS — K760 Fatty (change of) liver, not elsewhere classified: Secondary | ICD-10-CM

## 2015-03-19 DIAGNOSIS — K449 Diaphragmatic hernia without obstruction or gangrene: Secondary | ICD-10-CM | POA: Diagnosis not present

## 2015-03-19 DIAGNOSIS — E038 Other specified hypothyroidism: Secondary | ICD-10-CM | POA: Diagnosis not present

## 2015-03-19 DIAGNOSIS — K7031 Alcoholic cirrhosis of liver with ascites: Secondary | ICD-10-CM | POA: Diagnosis not present

## 2015-03-19 DIAGNOSIS — K259 Gastric ulcer, unspecified as acute or chronic, without hemorrhage or perforation: Secondary | ICD-10-CM | POA: Diagnosis not present

## 2015-03-19 DIAGNOSIS — R569 Unspecified convulsions: Secondary | ICD-10-CM | POA: Diagnosis present

## 2015-03-19 DIAGNOSIS — G40909 Epilepsy, unspecified, not intractable, without status epilepticus: Secondary | ICD-10-CM | POA: Diagnosis not present

## 2015-03-19 DIAGNOSIS — D696 Thrombocytopenia, unspecified: Secondary | ICD-10-CM | POA: Diagnosis present

## 2015-03-19 DIAGNOSIS — Z803 Family history of malignant neoplasm of breast: Secondary | ICD-10-CM | POA: Diagnosis not present

## 2015-03-19 DIAGNOSIS — D649 Anemia, unspecified: Secondary | ICD-10-CM | POA: Diagnosis present

## 2015-03-19 DIAGNOSIS — R0600 Dyspnea, unspecified: Secondary | ICD-10-CM | POA: Diagnosis present

## 2015-03-19 DIAGNOSIS — R188 Other ascites: Secondary | ICD-10-CM | POA: Diagnosis present

## 2015-03-19 DIAGNOSIS — Z8249 Family history of ischemic heart disease and other diseases of the circulatory system: Secondary | ICD-10-CM

## 2015-03-19 DIAGNOSIS — K740 Hepatic fibrosis: Secondary | ICD-10-CM | POA: Diagnosis not present

## 2015-03-19 DIAGNOSIS — R17 Unspecified jaundice: Secondary | ICD-10-CM

## 2015-03-19 DIAGNOSIS — E039 Hypothyroidism, unspecified: Secondary | ICD-10-CM | POA: Diagnosis present

## 2015-03-19 DIAGNOSIS — Z8041 Family history of malignant neoplasm of ovary: Secondary | ICD-10-CM

## 2015-03-19 DIAGNOSIS — R609 Edema, unspecified: Secondary | ICD-10-CM

## 2015-03-19 DIAGNOSIS — K7460 Unspecified cirrhosis of liver: Secondary | ICD-10-CM | POA: Diagnosis present

## 2015-03-19 DIAGNOSIS — K7290 Hepatic failure, unspecified without coma: Secondary | ICD-10-CM | POA: Diagnosis not present

## 2015-03-19 DIAGNOSIS — R0602 Shortness of breath: Secondary | ICD-10-CM | POA: Diagnosis present

## 2015-03-19 DIAGNOSIS — R601 Generalized edema: Secondary | ICD-10-CM | POA: Diagnosis not present

## 2015-03-19 DIAGNOSIS — Z808 Family history of malignant neoplasm of other organs or systems: Secondary | ICD-10-CM | POA: Diagnosis not present

## 2015-03-19 DIAGNOSIS — K766 Portal hypertension: Secondary | ICD-10-CM | POA: Diagnosis not present

## 2015-03-19 DIAGNOSIS — Z833 Family history of diabetes mellitus: Secondary | ICD-10-CM | POA: Diagnosis not present

## 2015-03-19 DIAGNOSIS — I8500 Esophageal varices without bleeding: Secondary | ICD-10-CM | POA: Diagnosis not present

## 2015-03-19 HISTORY — DX: Thrombocytopenia, unspecified: D69.6

## 2015-03-19 HISTORY — DX: Unspecified cirrhosis of liver: K74.60

## 2015-03-19 LAB — PROTIME-INR
INR: 1.55 — ABNORMAL HIGH (ref 0.00–1.49)
Prothrombin Time: 18.7 seconds — ABNORMAL HIGH (ref 11.6–15.2)

## 2015-03-19 LAB — COMPREHENSIVE METABOLIC PANEL
ALK PHOS: 126 U/L — AB (ref 39–117)
ALT: 39 U/L — ABNORMAL HIGH (ref 0–35)
AST: 85 U/L — AB (ref 0–37)
Albumin: 2.4 g/dL — ABNORMAL LOW (ref 3.5–5.2)
Anion gap: 8 (ref 5–15)
BILIRUBIN TOTAL: 10.5 mg/dL — AB (ref 0.3–1.2)
BUN: 14 mg/dL (ref 6–23)
CHLORIDE: 103 mmol/L (ref 96–112)
CO2: 23 mmol/L (ref 19–32)
Calcium: 8.5 mg/dL (ref 8.4–10.5)
Creatinine, Ser: 0.63 mg/dL (ref 0.50–1.10)
GFR calc Af Amer: >90 mL/min (ref >90)
GFR calc non Af Amer: >90 mL/min (ref >90)
Glucose, Bld: 125 mg/dL — ABNORMAL HIGH (ref 70–99)
Potassium: 4.3 mmol/L (ref 3.5–5.1)
Sodium: 134 mmol/L — ABNORMAL LOW (ref 135–145)
Total Protein: 6.5 g/dL (ref 6.0–8.3)

## 2015-03-19 LAB — CBC WITH DIFFERENTIAL/PLATELET
Basophils Absolute: 0 10*3/uL (ref 0.0–0.1)
Basophils Relative: 1 % (ref 0–1)
Eosinophils Absolute: 0.3 10*3/uL (ref 0.0–0.7)
Eosinophils Relative: 4 % (ref 0–5)
HCT: 34.7 % — ABNORMAL LOW (ref 36.0–46.0)
Hemoglobin: 11.8 g/dL — ABNORMAL LOW (ref 12.0–15.0)
Lymphocytes Relative: 27 % (ref 12–46)
Lymphs Abs: 2 10*3/uL (ref 0.7–4.0)
MCH: 31.4 pg (ref 26.0–34.0)
MCHC: 34 g/dL (ref 30.0–36.0)
MCV: 92.3 fL (ref 78.0–100.0)
Monocytes Absolute: 0.7 10*3/uL (ref 0.1–1.0)
Monocytes Relative: 10 % (ref 3–12)
NEUTROS ABS: 4.5 10*3/uL (ref 1.7–7.7)
NEUTROS PCT: 59 % (ref 43–77)
Platelets: 146 10*3/uL — ABNORMAL LOW (ref 150–400)
RBC: 3.76 MIL/uL — ABNORMAL LOW (ref 3.87–5.11)
RDW: 21.3 % — ABNORMAL HIGH (ref 11.5–15.5)
WBC: 7.6 10*3/uL (ref 4.0–10.5)

## 2015-03-19 LAB — APTT: APTT: 39 s — AB (ref 24–37)

## 2015-03-19 LAB — I-STAT CG4 LACTIC ACID, ED: Lactic Acid, Venous: 2.58 mmol/L (ref 0.5–2.0)

## 2015-03-19 LAB — BRAIN NATRIURETIC PEPTIDE: B Natriuretic Peptide: 158 pg/mL — ABNORMAL HIGH (ref 0.0–100.0)

## 2015-03-19 LAB — TROPONIN I: Troponin I: <0.03 ng/mL (ref <0.031)

## 2015-03-19 LAB — CERULOPLASMIN: Ceruloplasmin: 29 mg/dL (ref 18–53)

## 2015-03-19 LAB — SEDIMENTATION RATE: Sed Rate: 15 mm/hr (ref 0–20)

## 2015-03-19 MED ORDER — ONDANSETRON HCL 4 MG/2ML IJ SOLN: 4.0000 mg | Freq: Four times a day (QID) | INTRAMUSCULAR | Status: DC | PRN

## 2015-03-19 MED ORDER — URSODIOL 250 MG PO TABS: 250.0000 mg | ORAL_TABLET | Freq: Three times a day (TID) | ORAL | Status: DC

## 2015-03-19 MED ORDER — LORAZEPAM 0.5 MG PO TABS
0.5000 mg | ORAL_TABLET | ORAL | Status: DC | PRN
Start: 2015-03-19 — End: 2015-03-19

## 2015-03-19 MED ORDER — VITAMIN K1 10 MG/ML IJ SOLN
10.0000 mg | Freq: Once | INTRAMUSCULAR | Status: AC
  Administered 2015-03-19: 10 mg via SUBCUTANEOUS
  Filled 2015-03-19: qty 1

## 2015-03-19 MED ORDER — LORATADINE 10 MG PO TABS
10.0000 mg | ORAL_TABLET | Freq: Every day | ORAL | Status: DC
  Administered 2015-03-19 – 2015-03-23 (×5): 10 mg via ORAL
  Filled 2015-03-19 (×5): qty 1

## 2015-03-19 MED ORDER — FUROSEMIDE 10 MG/ML IJ SOLN
40.0000 mg | Freq: Once | INTRAMUSCULAR | Status: AC
  Administered 2015-03-19: 40 mg via INTRAVENOUS
  Filled 2015-03-19: qty 4

## 2015-03-19 MED ORDER — SPIRONOLACTONE 100 MG PO TABS
100.0000 mg | ORAL_TABLET | Freq: Every day | ORAL | Status: DC
  Filled 2015-03-19: qty 1

## 2015-03-19 MED ORDER — LEVOTHYROXINE SODIUM 75 MCG PO TABS: 75.0000 ug | ORAL_TABLET | ORAL | Status: DC

## 2015-03-19 MED ORDER — SPIRONOLACTONE 25 MG PO TABS
100.0000 mg | ORAL_TABLET | Freq: Two times a day (BID) | ORAL | Status: DC
  Administered 2015-03-20 – 2015-03-23 (×7): 100 mg via ORAL
  Filled 2015-03-19 (×7): qty 4

## 2015-03-19 MED ORDER — LEVOTHYROXINE SODIUM 75 MCG PO TABS
75.0000 ug | ORAL_TABLET | Freq: Every day | ORAL | Status: DC
  Administered 2015-03-20 – 2015-03-23 (×4): 75 ug via ORAL
  Filled 2015-03-19 (×4): qty 1

## 2015-03-19 MED ORDER — LEVETIRACETAM 500 MG PO TABS
1000.0000 mg | ORAL_TABLET | Freq: Two times a day (BID) | ORAL | Status: DC
  Filled 2015-03-19: qty 2

## 2015-03-19 MED ORDER — LORAZEPAM 0.5 MG PO TABS
0.5000 mg | ORAL_TABLET | Freq: Four times a day (QID) | ORAL | Status: DC | PRN
  Administered 2015-03-20: 0.5 mg via ORAL
  Filled 2015-03-19: qty 1

## 2015-03-19 MED ORDER — URSODIOL 300 MG PO CAPS
300.0000 mg | ORAL_CAPSULE | Freq: Three times a day (TID) | ORAL | Status: DC
  Administered 2015-03-19 – 2015-03-23 (×11): 300 mg via ORAL
  Filled 2015-03-19 (×17): qty 1

## 2015-03-19 MED ORDER — SODIUM CHLORIDE 0.9 % IJ SOLN
3.0000 mL | Freq: Two times a day (BID) | INTRAMUSCULAR | Status: DC
  Administered 2015-03-19 – 2015-03-23 (×8): 3 mL via INTRAVENOUS

## 2015-03-19 MED ORDER — URSODIOL 300 MG PO CAPS
ORAL_CAPSULE | ORAL | Status: AC
  Filled 2015-03-19: qty 1

## 2015-03-19 MED ORDER — FUROSEMIDE 10 MG/ML IJ SOLN
40.0000 mg | Freq: Two times a day (BID) | INTRAMUSCULAR | Status: DC
  Administered 2015-03-20 – 2015-03-23 (×7): 40 mg via INTRAVENOUS
  Filled 2015-03-19 (×8): qty 4

## 2015-03-19 MED ORDER — LEVETIRACETAM 500 MG PO TABS
500.0000 mg | ORAL_TABLET | Freq: Two times a day (BID) | ORAL | Status: DC
  Administered 2015-03-19 – 2015-03-23 (×8): 500 mg via ORAL
  Filled 2015-03-19 (×7): qty 1

## 2015-03-19 MED ORDER — ONDANSETRON HCL 4 MG PO TABS: 4.0000 mg | ORAL_TABLET | Freq: Four times a day (QID) | ORAL | Status: DC | PRN

## 2015-03-19 MED ORDER — LEVOTHYROXINE SODIUM 50 MCG PO TABS: 25.0000 ug | ORAL_TABLET | ORAL | Status: DC

## 2015-03-19 NOTE — H&P (Signed)
[facility] History and Physical  Cynthia CravenLinda Demore ZDG:[id number] DOB: 02-01-1966 DOA: 03/19/2015  Referring physician: ER PCP: Catalina PizzaHALL, ZACH, MD   Chief Complaint: Abdominal distention, dyspnea or leg swelling.  HPI: Cynthia Stephenson is a 49 y.o. female  This is a very pleasant 49 year old lady who has been diagnosed recently with cirrhosis, nonalcoholic, of the liver and she was seen by gastroenterology a few days ago in the office. She now comes with symptoms of abdominal distention, dyspnea and leg swelling. She does not have abdominal pain or fever. She denies any chest pain. She has never had a paracentesis. Her gastroenterologist has oriented [facility]. Her bilirubins have been rising. There is no history of altered mental status. She is now being admitted for further management.   Review of Systems:  Apart from symptoms above, all systems negative.  Past Medical History  Diagnosis Date  . Thyroid disease   . Hypothyroidism   . Seizures    Past Surgical History  Procedure Laterality Date  . Abdominal hysterectomy    . Abdominal hysterectomy    . Liver biopsy     Social History:  reports that she has never smoked. She has never used smokeless tobacco. She reports that she does not drink alcohol or use illicit drugs.  No Known Allergies  Family History  Problem Relation Age of Onset  . Breast cancer Mother   . Ovarian cancer Mother   . Thyroid cancer Mother   . Diabetes Father   . Heart disease Father   . Skin cancer Father   . Celiac disease Paternal Aunt       Prior to Admission medications   Medication Sig Start Date End Date Taking? Authorizing Provider  fexofenadine (ALLEGRA) 180 MG tablet Take 180 mg by mouth daily as needed for allergies or rhinitis.   Yes Historical Provider, MD  furosemide (LASIX) 20 MG tablet Take 1 tablet (20 mg total) by mouth See admin instructions. Patient taking differently: Take 20 mg by mouth  daily.  02/24/15  Yes Len Blalockerri L [name], NP  levETIRAcetam (KEPPRA) 500 MG tablet Take 1 tablet (500 mg total) by mouth 2 (two) times daily. Patient taking differently: Take 1,000 mg by mouth 2 (two) times daily.  02/20/15  Yes Suanne MarkerVikram R Penumalli, MD  levothyroxine (SYNTHROID, LEVOTHROID) 25 MCG tablet Take 1 tablet by mouth every morning. 12/18/14  Yes Historical Provider, MD  LORazepam (ATIVAN) 0.5 MG tablet Take 1 tablet (0.5 mg total) by mouth as needed for anxiety. 02/24/15  Yes Len Blalockerri L [name], NP  Multiple Vitamin (ONCE DAILY) TABS Take 1 tablet by mouth daily. womens daily   Yes Historical Provider, MD  spironolactone (ALDACTONE) 100 MG tablet Take 1 tablet (100 mg total) by mouth daily. 03/17/15  Yes Len Blalockerri L [name], NP  ursodiol (URSO) 250 MG tablet Take 1 tablet (250 mg total) by mouth 3 (three) times daily. 02/24/15  Yes Len Blalockerri L [name], NP  spironolactone (ALDACTONE) 100 MG tablet Take 1 tablet (100 mg total) by mouth daily. 03/17/15   Len Blalockerri L [name], NP   Physical Exam: Filed Vitals:   03/19/15 1700 03/19/15 1730 03/19/15 1800 03/19/15 1830  BP: 102/49 103/65 114/46 131/42  Pulse: 84 89 91 96  Temp:      TempSrc:      Resp: 20 26 28 18   Height:      Weight:      SpO2: 99% 100% 99% 97%    Wt Readings  from Last 3 Encounters:  03/19/15 117.935 kg (260 lb)  03/17/15 114.397 kg (252 lb 3.2 oz)  02/24/15 105.824 kg (233 lb 4.8 oz)    General:  Appears calm and comfortable. Pleasant lady. Jaundice. Obese. Eyes: PERRL, normal lids, irises & conjunctiva ENT: grossly normal hearing, lips & tongue Neck: no LAD, masses or thyromegaly Cardiovascular: Systolic heart murmur, does not radiate. Appears benign. No clinical evidence of heart failure. Telemetry: SR, no arrhythmias  Respiratory: CTA bilaterally, no w/r/r. Normal respiratory effort. Abdomen: soft, ntnd. No evidence of clinical peritonitis. She does have clinical ascites and peripheral pitting edema in her legs, consistent with  decompensated liver disease. Skin: no rash or induration seen on limited exam Musculoskeletal: grossly normal tone BUE/BLE Psychiatric: grossly normal mood and affect, speech fluent and appropriate Neurologic: grossly non-focal. there is no evidence of hepatic encephalopathy.           Labs on Admission:  Basic Metabolic Panel:  Recent Labs Lab 03/17/15 1141 03/19/15 1340  NA 135 134*  K 3.9 4.3  CL 103 103  CO2 22 23  GLUCOSE 91 125*  BUN 12 14  CREATININE 0.55 0.63  CALCIUM 8.0* 8.5   Liver Function Tests:  Recent Labs Lab 03/17/15 1141 03/19/15 1340  AST 73* 85*  ALT 35 39*  ALKPHOS 126* 126*  BILITOT 10.4* 10.5*  PROT 5.7* 6.5  ALBUMIN 2.5* 2.4*   No results for input(s): LIPASE, AMYLASE in the last 168 hours. No results for input(s): AMMONIA in the last 168 hours. CBC:  Recent Labs Lab 03/17/15 1141 03/19/15 1340  WBC 6.8 7.6  NEUTROABS  --  4.5  HGB 11.7* 11.8*  HCT 33.6* 34.7*  MCV 89.8 92.3  PLT 146* 146*   Cardiac Enzymes:  Recent Labs Lab 03/19/15 1340  TROPONINI <0.03    BNP (last 3 results)  Recent Labs  03/19/15 1340  BNP 158.0*    ProBNP (last 3 results) No results for input(s): PROBNP in the last 8760 hours.  CBG: No results for input(s): GLUCAP in the last 168 hours.  Radiological Exams on Admission: Dg Chest 2 View  03/19/2015   CLINICAL DATA:  Shortness of breath. Bilateral lower extremity swelling. Cough. Symptoms for 4 days.  EXAM: CHEST  2 VIEW  COMPARISON:  PA and lateral chest 03/27/2007.  FINDINGS: Heart size and mediastinal contours are within normal limits. Both lungs are clear. Visualized skeletal structures are unremarkable.  IMPRESSION: Negative exam.   Electronically Signed   By: Drusilla Kanner M.D.   On: 03/19/2015 16:11   US Abdomen Limited  03/19/2015   CLINICAL DATA:  The shortness of breath and right upper quadrant abdominal pain. History of liver biopsy. Evaluate for ascites.  EXAM: US ABDOMEN LIMITED -  RIGHT UPPER QUADRANT  COMPARISON:  MRCP 03/16/2015.  Abdominal CT 02/11/2015.  FINDINGS: Gallbladder:  There is mild nonspecific gallbladder wall thickening to 4 mm. No evidence of gallstones, focal pericholecystic fluid or sonographic Murphy's sign.  Common bile duct:  Diameter: 2.4 mm  Liver:  The liver is shrunken with contour irregularity and increased echogenicity, corresponding with cirrhosis on prior studies. No focal lesions identified.  Moderate ascites identified in all 4 quadrants of the abdomen.  IMPRESSION: 1. Moderate ascites as demonstrated on recent prior studies. 2. Cirrhosis. 3. Mild nonspecific gallbladder wall thickening attributed to liver disease and ascites.   Electronically Signed   By: Carey Bullocks M.D.   On: 03/19/2015 17:44  Assessment/Plan   1. Cirrhosis of the liver with ascites. Her total bilirubin is increasing and her prothrombin time is elevated. She has decompensated liver disease. She will be started on intravenous diuretics. She will also get paracentesis tomorrow. Gastroenterology has  seen her and consultation is appreciated. There is no indication for antibiotics at this stage as there is no clinical indication of spontaneous bacterial peritonitis. 2. Seizure disorder. Stable. 3. Hypothyroidism. Stable. 4. Heart murmur. Appears benign. She says that her cardiologist is aware of this.  Further recommendations will depend on patient's hospital progress.   Code Status: Full code.   DVT Prophylaxis: SCDs.  Family Communication: I discussed the plan with the patient at the bedside.   Disposition Plan: Home when medically stable.   Time spent: 60 mins.  Wilson Singer [facility] Pager [phone].

## 2015-03-19 NOTE — ED Notes (Signed)
MD at bedside. 

## 2015-03-19 NOTE — ED Provider Notes (Addendum)
TIME SEEN: 3:25 PM  CHIEF COMPLAINT: Abdominal distention, shortness of breath, lower extremity swelling  HPI: Pt is a 49 y.o. female with history of hypothyroidism, seizures on Keppra, recent diagnosis of cirrhosis who had a liver biopsy on March 8 was followed by Dr. Karilyn Cota who presents to the emergency department with complaints of several days of shortness of breath worse with laying flat, lower extremity swelling bilaterally and abdominal distention. She is taking Lasix at home without any relief.  Denies any chest pain. Has had dry cough. No fever. No nausea, vomiting or diarrhea. No abdominal pain. States she just feels uncomfortable. States she called her gastroenterologist who instructed her to come to the emergency department she would likely need admission. She has never had a paracentesis. She is being referred to [facility] to see Dr. Julieta Gutting given her bilirubin levels continued to rise but she has not yet seen this liver specialist.  ROS: See HPI Constitutional: no fever  Eyes: no drainage  ENT: no runny nose   Cardiovascular:  no chest pain  Resp: SOB  GI: no vomiting GU: no dysuria Integumentary: no rash  Allergy: no hives  Musculoskeletal:  leg swelling  Neurological: no slurred speech ROS otherwise negative  PAST MEDICAL HISTORY/PAST SURGICAL HISTORY:  Past Medical History  Diagnosis Date  . Thyroid disease   . Hypothyroidism   . Seizures     MEDICATIONS:  Prior to Admission medications   Medication Sig Start Date End Date Taking? Authorizing Provider  fexofenadine (ALLEGRA) 180 MG tablet Take 180 mg by mouth daily as needed for allergies or rhinitis.   Yes Historical Provider, MD  furosemide (LASIX) 20 MG tablet Take 1 tablet (20 mg total) by mouth See admin instructions. Patient taking differently: Take 20 mg by mouth daily.  02/24/15  Yes Len Blalock, NP  levETIRAcetam (KEPPRA) 500 MG tablet Take 1 tablet (500 mg total) by mouth 2 (two) times daily. Patient taking  differently: Take 1,000 mg by mouth 2 (two) times daily.  02/20/15  Yes Suanne Marker, MD  levothyroxine (SYNTHROID, LEVOTHROID) 25 MCG tablet Take 1 tablet by mouth every morning. 12/18/14  Yes Historical Provider, MD  LORazepam (ATIVAN) 0.5 MG tablet Take 1 tablet (0.5 mg total) by mouth as needed for anxiety. 02/24/15  Yes Len Blalock, NP  Multiple Vitamin (ONCE DAILY) TABS Take 1 tablet by mouth daily. womens daily   Yes Historical Provider, MD  spironolactone (ALDACTONE) 100 MG tablet Take 1 tablet (100 mg total) by mouth daily. 03/17/15  Yes Len Blalock, NP  ursodiol (URSO) 250 MG tablet Take 1 tablet (250 mg total) by mouth 3 (three) times daily. 02/24/15  Yes Len Blalock, NP  spironolactone (ALDACTONE) 100 MG tablet Take 1 tablet (100 mg total) by mouth daily. 03/17/15   Len Blalock, NP    ALLERGIES:  No Known Allergies  SOCIAL HISTORY:  History  Substance Use Topics  . Smoking status: Never Smoker   . Smokeless tobacco: Never Used  . Alcohol Use: No    FAMILY HISTORY: Family History  Problem Relation Age of Onset  . Breast cancer Mother   . Ovarian cancer Mother   . Thyroid cancer Mother   . Diabetes Father   . Heart disease Father   . Skin cancer Father   . Celiac disease Paternal Aunt     EXAM: BP 105/48 mmHg  Pulse 85  Temp(Src) 97.9 F (36.6 C) (Oral)  Resp 22  Ht 5'  2" (1.575 m)  Wt 260 lb (117.935 kg)  BMI 47.54 kg/m2  SpO2 99% CONSTITUTIONAL: Alert and oriented and responds appropriately to questions. Obese, initially appeared uncomfortable off of oxygen per reports feeling better on 2 L EYES: Conjunctivae clear, PERRL, scleral icterus ENT: normal nose; no rhinorrhea; moist mucous membranes; pharynx without lesions noted NECK: Supple, no meningismus, no LAD  CARD: RRR; S1 and S2 appreciated; patient has a loud systolic murmur best heard in the left side of the chest, no clicks, no rubs, no gallops RESP: Normal chest excursion without splinting;  patient is mildly tachypneic off oxygen but no hypoxia, lungs are clear to auscultation with good aeration, no wheezing, rhonchi, Rales ABD/GI: Normal bowel sounds; abdomen is distended but nontender to palpation diffusely, no guarding or rebound, no peritoneal signs, no tympany, possible fluid wave BACK:  The back appears normal and is non-tender to palpation, there is no CVA tenderness EXT: Normal ROM in all joints; non-tender to palpation; bilateral lower extremity edema that is nonpitting to the midcalf; normal capillary refill; no cyanosis    SKIN: Normal color for age and race; warm, patient has jaundice NEURO: Moves all extremities equally PSYCH: The patient's mood and manner are appropriate. Grooming and personal hygiene are appropriate.  MEDICAL DECISION MAKING: Patient here with volume overload, what appears to be ascites. She is not in any respiratory distress but does report feeling short of breath.  Left shoulder elevated LFTs with a total bilirubin of 10.5. Lactate also slightly elevated. BNP is 158. Troponin negative. Chest x-ray clear. Abdominal ultrasound shows collateral thickening likely related to her liver disease and moderate volume ascites. Will admit to medicine. Will diurese with IV Lasix.  ED PROGRESS:    5:55 PM  Spoke with Dr. Karilyn Cotaehman with gastroenterology who agrees with medicine admission. He will see the patient in consult for morning. She will likely need a large volume paracentesis while in the hospital but does not need it emergently. Also agrees with IV diuresis and echocardiogram while inpatient. We'll discuss with hospitalist for admission. Patient's primary care provider is Dr. Margo AyeHall.   6:30 PM  D/w Dr. Karilyn CotaGosrani with hospitalist service who agrees with admission. Will place orders for medical bed, inpatient.    EKG Interpretation  Date/Time:  Thursday March 19 2015 13:35:05 EDT Ventricular Rate:  90 PR Interval:  145 QRS Duration: 92 QT Interval:  379 QTC  Calculation: 464 R Axis:   82 Text Interpretation:  Sinus rhythm No old tracing to compare Confirmed by [facility]  MD, ELLIOTT [phone]([id number]) on 03/19/2015 2:40:15 PM        Layla MawKristen N Ward, DO 03/19/15 1835  Layla MawKristen N Ward, DO 03/19/15 1836  Layla MawKristen N Ward, DO 03/19/15 2322

## 2015-03-19 NOTE — ED Notes (Signed)
Pt calm on assessment. Pt has regular respirations that are labored. Pt placed on oxygen at 2L, MD made aware of pt status.

## 2015-03-19 NOTE — ED Notes (Signed)
Being treated by Dr Valetta FullerSetzer office for liver problem.  Sunday notice increased swelling to abdomen to legs.  Having SOB since yesterday and increased today.

## 2015-03-19 NOTE — Progress Notes (Addendum)
GI attending note; Patient is 49 year old Caucasian female who is well-known to me from recent evaluation for cirrhosis and jaundice. She apparently was diagnosed with cirrhosis in 2013 at the time of hysterectomy based on visual inspection of liver but did not undergo biopsy. She was recently noted to be jaundiced by her PCP Dr. Catalina PizzaZach Hall and was seen in our office on 02/10/2015. Ultrasound of 12/24/2014 revealed liver contour consistent with cirrhosis and splenomegaly. Workup revealed negative hepatitis C virus antibody and hepatitis B surface antigen. Total cholesterol in January 2016 was 169. ANA and SMA were negative. Serum ferritin was mildly elevated at 513 Ceruloplasmin and alpha-1 antitrypsin levels were normal. AFP was 6.4 She underwent liver biopsy on 02/17/2015 revealing cholestatic injury with cirrhosis and she had mixed inflammatory cells for the liver. Iron stains revealed 2+ hemosiderin in hepatocytes. Trichrome stain revealed nodular cirrhosis with thick bands of fibrous tissue. PAS positive material noted in hepatocytes. No granulomas are not injury noted. Secondary battery cirrhosis mentioned in differential diagnosis.  Patient was begun on Urso and furosemide. He was also given Ativan and advised not to take any OTC medications. She was advised to stop trazodone which she had been taking for insomnia. Later on spironolactone was added. She underwent MRI of liver last week and no ductal abnormalities noted.  Patient's husband called me earlier today stating that she was short of breath and was gaining weight. Patient was here for advised to come to the emergency room. She was evaluated by Dr. Elesa MassedWard and noted to have lower extremity edema as well as moderate amount of ascites confirmed on ultrasound. LFTs  pertinent for bilirubin of 10.5, AP 126 AST of 85 ALT of 39 and albumin of 2.4. Patient states she has been very nervous. She has had minimal pruritus and has been taking  Benadryl. She has had low-grade fever. She has not experienced abdominal pain  On exam patient is jaundiced. She is alert and does not have asterixis. Abdomen is distended and somewhat tense but nontender. She has 2+ pitting edema involving both legs and she has maculopapular rash over her distal legs anteriorly.  Assessment; #1. Cholestatic liver disease. Etiology has not been clearly established. I felt her jaundice was secondary to water from medication such as trazodone but all are from medications have been stopped but bilirubin keeps rising. Liver biopsy did not short typical features of PBC. Doubt that she has secondary battery cirrhosis due to battery obstruction. Autoimmune liver disease remains in differential diagnosis despite negative biochemical markers. It is too early to tell if she would respond to ursodeoxycholic acid. At this point there is no indication for steroid therapy. She has developed mild coagulopathy and that may be secondary to vitamin K deficiency in the setting of cholestasis. #2. Fluid overload appears to be secondary to chronic liver disease but will assess LV function to rule out other reasons. Ascites has increased despite diuretic therapy. She therefore needs to undergo abdominal paracentesis is both for diagnostic and therapeutic purposes and this has already been requested by Dr. Karilyn CotaGosrani. If she spikes temp during the night when initiate antibiotic therapy for SBP until abdominal tap completed.  Recommendations; Increase Spironolactone 200 mg by mouth twice a day. Change furosemide to IV route. Monitor electrolytes and renal function closely while she is being diuresed. She does not diurese will consider IV albumin. Diagnostic and therapeutic abdominal paracenteses. Antimitochondrial antibody(I was told it had been checked but I cannot find the result). Vit K 10 mg  subcutaneous 1 Check serum immunoglobulins, IgG 4 and serum ammonia. Will also check  serum angiotensin-converting enzyme. Dietary consultation for patient education for low salt diet. Echocardiography in a.m.

## 2015-03-20 ENCOUNTER — Encounter (HOSPITAL_COMMUNITY): Payer: Self-pay | Admitting: *Deleted

## 2015-03-20 ENCOUNTER — Inpatient Hospital Stay (HOSPITAL_COMMUNITY): Payer: BLUE CROSS/BLUE SHIELD

## 2015-03-20 DIAGNOSIS — R0600 Dyspnea, unspecified: Secondary | ICD-10-CM | POA: Diagnosis present

## 2015-03-20 DIAGNOSIS — K7460 Unspecified cirrhosis of liver: Principal | ICD-10-CM

## 2015-03-20 DIAGNOSIS — D696 Thrombocytopenia, unspecified: Secondary | ICD-10-CM | POA: Diagnosis present

## 2015-03-20 LAB — CBC
HEMATOCRIT: 30.9 % — AB (ref 36.0–46.0)
Hemoglobin: 10.7 g/dL — ABNORMAL LOW (ref 12.0–15.0)
MCH: 31.8 pg (ref 26.0–34.0)
MCHC: 34.6 g/dL (ref 30.0–36.0)
MCV: 91.7 fL (ref 78.0–100.0)
PLATELETS: 120 10*3/uL — AB (ref 150–400)
RBC: 3.37 MIL/uL — ABNORMAL LOW (ref 3.87–5.11)
RDW: 21.6 % — ABNORMAL HIGH (ref 11.5–15.5)
WBC: 8 10*3/uL (ref 4.0–10.5)

## 2015-03-20 LAB — COMPREHENSIVE METABOLIC PANEL
ALBUMIN: 2.1 g/dL — AB (ref 3.5–5.2)
ALT: 35 U/L (ref 0–35)
AST: 72 U/L — AB (ref 0–37)
Alkaline Phosphatase: 113 U/L (ref 39–117)
Anion gap: 7 (ref 5–15)
BILIRUBIN TOTAL: 8.9 mg/dL — AB (ref 0.3–1.2)
BUN: 14 mg/dL (ref 6–23)
CO2: 24 mmol/L (ref 19–32)
Calcium: 8.3 mg/dL — ABNORMAL LOW (ref 8.4–10.5)
Chloride: 105 mmol/L (ref 96–112)
Creatinine, Ser: 0.67 mg/dL (ref 0.50–1.10)
GFR calc Af Amer: >90 mL/min (ref >90)
GFR calc non Af Amer: >90 mL/min (ref >90)
Glucose, Bld: 84 mg/dL (ref 70–99)
Potassium: 4.4 mmol/L (ref 3.5–5.1)
Sodium: 136 mmol/L (ref 135–145)
Total Protein: 5.7 g/dL — ABNORMAL LOW (ref 6.0–8.3)

## 2015-03-20 LAB — AMMONIA: AMMONIA: 22 umol/L (ref 11–32)

## 2015-03-20 MED ORDER — ALBUMIN HUMAN 25 % IV SOLN
50.0000 g | Freq: Every day | INTRAVENOUS | Status: AC
  Administered 2015-03-20 – 2015-03-22 (×3): 50 g via INTRAVENOUS
  Filled 2015-03-20 (×3): qty 200

## 2015-03-20 NOTE — Progress Notes (Signed)
Patient sitting up in bed. Alert and oriented. Husband at bedside. Patient stated she is feeling some better. Abdomen still distended with tenderness. No nausea at this time. Lower extremities 2+ edema and rash noted.

## 2015-03-20 NOTE — Sedation Documentation (Signed)
Procedure called off, not enough fluid to drain, pt returned to room.

## 2015-03-20 NOTE — Progress Notes (Signed)
  Echocardiogram 2D Echocardiogram has been performed.  Stacey DrainWhite, Jaser Fullen J 03/20/2015, 2:51 PM

## 2015-03-20 NOTE — Plan of Care (Signed)
Problem: Food- and Nutrition-Related Knowledge Deficit (NB-1.1) Goal: Nutrition education Formal process to instruct or train a patient/client in a skill or to impart knowledge to help patients/clients voluntarily manage or modify food choices and eating behavior to maintain or improve health. Outcome: Adequate for Discharge Nutrition Education Note  RD consulted for nutrition education regarding a 4 gram sodium diet. Fluid goal < 2 liters daily.   RD provided "Low Sodium Nutrition Therapy" handout from the Academy of [employer]. Reviewed patient's dietary recall. Provided examples on ways to decrease sodium intake in her daily diet. Discouraged intake of processed foods and use of salt shaker. Encouraged fresh fruits and vegetables as well as whole grain sources of carbohydrates to maximize fiber intake. Also emphasized small meals and snacks between to meet increased protein and energy needs with liver disease. Teach back method used.  Expect good compliance.  Body mass index is 46.92 kg/(m^2). Pt meets criteria for obesity class III based on current BMI.  Current diet order is Heart Healthy, patient is consuming approximately 25-50% of meals at this time. Labs and medications reviewed. No further nutrition interventions warranted at this time. RD contact information provided. If additional nutrition issues arise, please re-consult RD.  Royann ShiversLynn Gael Delude MS,RD,CSG,LDN Office: [phone] Pager: [phone]

## 2015-03-20 NOTE — Care Management Note (Addendum)
    Page 1 of 1   03/23/2015     11:46:16 AM CARE MANAGEMENT NOTE 03/23/2015  Patient:  Cynthia Stephenson,Cynthia Stephenson   Account Number:  [id number]  Date Initiated:  03/20/2015  Documentation initiated by:  Kathyrn SheriffHILDRESS,JESSICA  Subjective/Objective Assessment:   Pt admitted with Cirrhosis. Pt is from home, lives with husband and independent with ADL's at baseline. Pt has no HH services or DME's prior to admission. Pt plans to discharge home wtih self care. No CM needs.     Action/Plan:   Anticipated DC Date:  03/23/2015   Anticipated DC Plan:  HOME/SELF CARE      DC Planning Services  CM consult      Choice offered to / List presented to:             Status of service:  Completed, signed off Medicare Important Message given?   (If response is "NO", the following Medicare IM given date fields will be blank) Date Medicare IM given:   Medicare IM given by:   Date Additional Medicare IM given:   Additional Medicare IM given by:    Discharge Disposition:  HOME/SELF CARE  Per UR Regulation:  Reviewed for med. necessity/level of care/duration of stay  If discussed at Long Length of Stay Meetings, dates discussed:    Comments:  03/23/2015 1145 Kathyrn SheriffJessica Childress, RN, MSN, CM Pt discharging home today. No CM needs. 03/20/2015 1130 Kathyrn SheriffJessica Childress, RN, MSN, CM

## 2015-03-20 NOTE — Care Management Utilization Note (Signed)
UR completed 

## 2015-03-20 NOTE — Progress Notes (Signed)
Patient taken down to radiology at this time via wheelchair.

## 2015-03-20 NOTE — Progress Notes (Signed)
[facility] PROGRESS NOTE  Cynthia Stephenson UVO:[id number] DOB: Aug 14, 1966 DOA: 03/19/2015 PCP: Cynthia Pizza, MD  Assessment/Plan:  1. Cirrhosis of the liver with ascites.  Etiology unclear. Extensive workup todate unrevealing.  Her total bilirubin tending down somewhat. Paracentesis scheduled but not enough fluid. Continue with intravenous diuretics. Albumin added per GI. Appreciate GI assistance. She remains afebrile and non-toxic There is no indication for antibiotics at this time. 2. Seizure disorder. Stable. 3. Hypothyroidism. Stable. 4. Heart murmur. Appears benign. She says that her cardiologist is aware of this. 5. Dyspnea: related to ascites secondary to decompensated cirrhosis. Await  Echo to evaluate LV function for completeness. Much improved this am. Oxygen saturation level >90% on room air. Will monitr   Code Status: full Family Communication: husband at bedside Disposition Plan: home   Consultants:  GI  Procedures:  none  Antibiotics:  none  HPI/Subjective: Reports feeling much better this am.   Objective: Filed Vitals:   03/20/15 1034  BP: 132/64  Pulse: 88  Temp:   Resp:     Intake/Output Summary (Last 24 hours) at 03/20/15 1140 Last data filed at 03/20/15 0622  Gross per 24 hour  Intake    240 ml  Output   1050 ml  Net   -810 ml   Filed Weights   03/19/15 1320 03/19/15 2139 03/20/15 0619  Weight: 117.935 kg (260 lb) 118.298 kg (260 lb 12.8 oz) 116.393 kg (256 lb 9.6 oz)    Exam:   General:  Obese appears comfortable  Cardiovascular: RRR no MGR 1+LE edema  Respiratory: normal effort BS clear but distant  Abdomen: somewhat distended +BS sluggish non-tender  Musculoskeletal: no clubbing or cyanosis  Skin: maculopapular rash LE   Data Reviewed: Basic Metabolic Panel:  Recent Labs Lab 03/17/15 1141 03/19/15 1340 03/20/15 0701  NA 135 134* 136  K 3.9 4.3 4.4  CL 103 103 105  CO2 GLUCOSE 91 125* 84  BUN CREATININE 0.55 0.63 0.67  CALCIUM 8.0* 8.5 8.3*   Liver Function Tests:  Recent Labs Lab 03/17/15 1141 03/19/15 1340 03/20/15 0701  AST 73* 85* 72*  ALT 35 39* 35  ALKPHOS 126* 126* 113  BILITOT 10.4* 10.5* 8.9*  PROT 5.7* 6.5 5.7*  ALBUMIN 2.5* 2.4* 2.1*   No results for input(s): LIPASE, AMYLASE in the last 168 hours.  Recent Labs Lab 03/20/15 0614  AMMONIA 22   CBC:  Recent Labs Lab 03/17/15 1141 03/19/15 1340 03/20/15 0701  WBC 6.8 7.6 8.0  NEUTROABS  --  4.5  --   HGB 11.7* 11.8* 10.7*  HCT 33.6* 34.7* 30.9*  MCV 89.8 92.3 91.7  PLT 146* 146* 120*   Cardiac Enzymes:  Recent Labs Lab 03/19/15 1340  TROPONINI <0.03   BNP (last 3 results)  Recent Labs  03/19/15 1340  BNP 158.0*    ProBNP (last 3 results) No results for input(s): PROBNP in the last 8760 hours.  CBG: No results for input(s): GLUCAP in the last 168 hours.  No results found for this or any previous visit (from the past 240 hour(s)).   Studies: Dg Chest 2 View  03/19/2015   CLINICAL DATA:  Shortness of breath. Bilateral lower extremity swelling. Cough. Symptoms for 4 days.  EXAM: CHEST  2 VIEW  COMPARISON:  PA and lateral chest 03/27/2007.  FINDINGS: Heart size and mediastinal contours are within normal limits. Both lungs are clear. Visualized skeletal structures are unremarkable.  IMPRESSION: Negative  exam.   Electronically Signed   By: Cynthia Stephenson  Stephenson M.D.   On: 03/19/2015 16:11   [place] Abdomen Limited  03/20/2015   CLINICAL DATA:  Ascites.  EXAM: LIMITED ABDOMEN ULTRASOUND FOR ASCITES  TECHNIQUE: Limited ultrasound survey for ascites was performed in all four abdominal quadrants.  COMPARISON:  03/19/2015.  FINDINGS: Minimal ascites noted. Paracentesis not performed. A follow-up exam can be obtained as needed .  IMPRESSION: Minimal ascites noted.  Paracentesis not performed.   Electronically Signed   By: Cynthia Fushomas  Stephenson   On: 03/20/2015 09:27   [place] Abdomen Limited  03/19/2015    CLINICAL DATA:  The shortness of breath and right upper quadrant abdominal pain. History of liver biopsy. Evaluate for ascites.  EXAM: US ABDOMEN LIMITED - RIGHT UPPER QUADRANT  COMPARISON:  MRCP 03/16/2015.  Abdominal CT 02/11/2015.  FINDINGS: Gallbladder:  There is mild nonspecific gallbladder wall thickening to 4 mm. No evidence of gallstones, focal pericholecystic fluid or sonographic Murphy's sign.  Common bile duct:  Diameter: 2.4 mm  Liver:  The liver is shrunken with contour irregularity and increased echogenicity, corresponding with cirrhosis on prior studies. No focal lesions identified.  Moderate ascites identified in all 4 quadrants of the abdomen.  IMPRESSION: 1. Moderate ascites as demonstrated on recent prior studies. 2. Cirrhosis. 3. Mild nonspecific gallbladder wall thickening attributed to liver disease and ascites.   Electronically Signed   By: Cynthia BullocksWilliam  Stephenson M.D.   On: 03/19/2015 17:44    Scheduled Meds: . albumin human  50 g Intravenous Daily  . furosemide  40 mg Intravenous Q12H  . levETIRAcetam  500 mg Oral BID  . levothyroxine  75 mcg Oral QAC breakfast  . loratadine  10 mg Oral Daily  . sodium chloride  3 mL Intravenous Q12H  . spironolactone  100 mg Oral BID  . ursodiol  300 mg Oral TID   Continuous Infusions:   Principal Problem:   Dyspnea Active Problems:   Hypothyroidism   Seizure disorder   Cirrhosis of liver with ascites   Ascites    Time spent: 35 minutes    [facility]linville Area HospitalBLACK,Cynthia Riccardi M  [facility] Pager [phone]. If 7PM-7AM, please contact night-coverage at [email or web address], password [email or web address] 03/20/2015, 11:40 AM  LOS: 1 day

## 2015-03-20 NOTE — Progress Notes (Signed)
  Subjective:  Patient feels much better. She can breathe better she does not feel bloated anymore. She is passing a lot of urine. She denies nausea vomiting or abdominal pain.   Objective: Blood pressure 131/66, pulse 91, temperature 98.1 F (36.7 C), temperature source Oral, resp. rate 22, height 5\' 2"  (1.575 m), weight 256 lb 9.6 oz (116.393 kg), SpO2 97 %. Patient is alert and does not have asterixis. Conjunctiva is pink. Sclera is icteric Oropharyngeal mucosa is normal. No neck masses or thyromegaly noted. Cardiac exam with regular rhythm normal S1 and S2. No murmur or gallop noted. Lungs are clear to auscultation. Abdomen is full but soft and nontender without organomegaly or masses.  He has 1-2+ pitting edema around ankles and legs.  Labs/studies Results:   Recent Labs  03/19/15 1340 03/20/15 0701  WBC 7.6 8.0  HGB 11.8* 10.7*  HCT 34.7* 30.9*  PLT 146* 120*    BMET   Recent Labs  03/19/15 1340 03/20/15 0701  NA 134* 136  K 4.3 4.4  CL 103 105  CO2 23 24  GLUCOSE 125* 84  BUN 14 14  CREATININE 0.63 0.67  CALCIUM 8.5 8.3*    LFT   Recent Labs  03/19/15 1340 03/20/15 0701  PROT 6.5 5.7*  ALBUMIN 2.4* 2.1*  AST 85* 72*  ALT 39* 35  ALKPHOS 126* 113  BILITOT 10.5* 8.9*     AMA and other labs pending. Echo pending. Abdominal tap not performed as she did not have large amount of ascites.  Immunoglobulins, IgG 4 and Ace levels pending   Assessment:  #1. Fluid overload in the form of ascites in third spacing. She is responding to diuretic therapy. She was given 50 g of albumin IV this morning and will receive 2 more doses over the next 2 days. #2. Cholestatic liver disease. She has biopsy-proven cirrhosis. She has intrahepatic cholestasis and bile pigment. Etiology not clear despite extensive workup and liver biopsy. I doubt that she has PBC given histology. Could not find AMA result therefore is being repeated.  It is reassuring to note that her  bilirubin has decreased from 10.5 to 8.9.  #3. Mild anemia and thrombocytopenia. No evidence of GI bleed. She will undergo EGD and possibly colonoscopy on an outpatient basis.  Recommendations;  Continue albumin 50 g IV followed by furosemide 40 mg IV for 2 more days. INR and electrolytes in a.m.  Dr. Jena Gaussourk will be assisting you with GI issues over the weekend.

## 2015-03-21 DIAGNOSIS — R601 Generalized edema: Secondary | ICD-10-CM

## 2015-03-21 LAB — PROTIME-INR
INR: 1.57 — AB (ref 0.00–1.49)
Prothrombin Time: 18.9 seconds — ABNORMAL HIGH (ref 11.6–15.2)

## 2015-03-21 LAB — MITOCHONDRIAL ANTIBODIES: Mitochondrial M2 Ab, IgG: 15 Units (ref 0.0–20.0)

## 2015-03-21 LAB — BASIC METABOLIC PANEL
Anion gap: 9 (ref 5–15)
BUN: 16 mg/dL (ref 6–23)
CO2: 26 mmol/L (ref 19–32)
Calcium: 8.7 mg/dL (ref 8.4–10.5)
Chloride: 103 mmol/L (ref 96–112)
Creatinine, Ser: 0.72 mg/dL (ref 0.50–1.10)
GFR calc non Af Amer: >90 mL/min (ref >90)
Glucose, Bld: 79 mg/dL (ref 70–99)
Potassium: 4.2 mmol/L (ref 3.5–5.1)
SODIUM: 138 mmol/L (ref 135–145)

## 2015-03-21 LAB — ANGIOTENSIN CONVERTING ENZYME: ANGIOTENSIN-CONVERTING ENZYME: 111 U/L — AB (ref 14–82)

## 2015-03-21 LAB — MAGNESIUM: Magnesium: 2 mg/dL (ref 1.5–2.5)

## 2015-03-21 LAB — IGG, IGA, IGM
IGA: 608 mg/dL — AB (ref 87–352)
IgG (Immunoglobin G), Serum: 1558 mg/dL (ref 700–1600)
IgM, Serum: 415 mg/dL — ABNORMAL HIGH (ref 26–217)

## 2015-03-21 MED ORDER — IBUPROFEN 800 MG PO TABS
400.0000 mg | ORAL_TABLET | Freq: Three times a day (TID) | ORAL | Status: DC | PRN
  Administered 2015-03-21 – 2015-03-22 (×2): 400 mg via ORAL
  Filled 2015-03-21 (×2): qty 1

## 2015-03-21 NOTE — Progress Notes (Addendum)
[facility] PROGRESS NOTE  Cynthia CravenLinda Stephenson EAV:[id number] DOB: 1966-08-05 DOA: 03/19/2015 PCP: Catalina PizzaHALL, ZACH, MD  Assessment/Plan: Cirrhosis of the liver -Of unclear etiology despite liver biopsy. -Serum AMA pending. -She is diuresing nicely with furosemide and albumin. -2-D echo shows ejection fraction of 60-65% with abnormal diastolic function, no wall motion abnormalities. -INR did not improve with vitamin K. -Bilirubin level has decreased. -[facility] by Dr. Karilyn Cotaehman.  Seizure disorder -Continue Keppra, no active seizures while in the hospital.  Hypothyroidism -Continue Synthroid.  Dyspnea -Secondary to decompensated cirrhosis, much improved, has had significant diuresis of almost 6 L on Lasix and albumin.   Code Status: Full Code Family Communication: patient only  Disposition Plan: Home when ready   Consultants:  GI   Antibiotics:  None   Subjective: Less short of breath, has been urinating frequently.  Objective: Filed Vitals:   03/20/15 1446 03/20/15 1459 03/20/15 2131 03/21/15 0549  BP: 144/62 126/65 125/60 125/59  Pulse: 92 92 87 78  Temp: 98 F (36.7 C) 98.2 F (36.8 C) 98.7 F (37.1 C) 98.6 F (37 C)  TempSrc: Oral Oral Oral Oral  Resp: 19 20 20 20   Height:      Weight:    112.628 kg (248 lb 4.8 oz)  SpO2: 97% 96% 97% 97%    Intake/Output Summary (Last 24 hours) at 03/21/15 1430 Last data filed at 03/21/15 1252  Gross per 24 hour  Intake    363 ml  Output   5850 ml  Net  -5487 ml   Filed Weights   03/19/15 2139 03/20/15 0619 03/21/15 0549  Weight: 118.298 kg (260 lb 12.8 oz) 116.393 kg (256 lb 9.6 oz) 112.628 kg (248 lb 4.8 oz)    Exam:   General:  alert, awake, oriented 3, no distress   Cardiovascular: regular rate and rhythm, no murmurs, rubs or gallops   Respiratory:There to auscultation bilaterally  Abdomen:  soft, positive bowel sounds  Extremities: 2+ pitting edema,  purple, macular rash over anterior aspect of both lower extremities   Neurologic:  Intact/non-focal  Data Reviewed: Basic Metabolic Panel:  Recent Labs Lab 03/17/15 1141 03/19/15 1340 03/20/15 0701 03/21/15 0707  NA 135 134* 136 138  K 3.9 4.3 4.4 4.2  CL 103 103 105 103  CO2 22 23 24 26   GLUCOSE 91 125* 84 79  BUN 12 14 14 16   CREATININE 0.55 0.63 0.67 0.72  CALCIUM 8.0* 8.5 8.3* 8.7  MG  --   --   --  2.0   Liver Function Tests:  Recent Labs Lab 03/17/15 1141 03/19/15 1340 03/20/15 0701  AST 73* 85* 72*  ALT 35 39* 35  ALKPHOS 126* 126* 113  BILITOT 10.4* 10.5* 8.9*  PROT 5.7* 6.5 5.7*  ALBUMIN 2.5* 2.4* 2.1*   No results for input(s): LIPASE, AMYLASE in the last 168 hours.  Recent Labs Lab 03/20/15 0614  AMMONIA 22   CBC:  Recent Labs Lab 03/17/15 1141 03/19/15 1340 03/20/15 0701  WBC 6.8 7.6 8.0  NEUTROABS  --  4.5  --   HGB 11.7* 11.8* 10.7*  HCT 33.6* 34.7* 30.9*  MCV 89.8 92.3 91.7  PLT 146* 146* 120*   Cardiac Enzymes:  Recent Labs Lab 03/19/15 1340  TROPONINI <0.03   BNP (last 3 results)  Recent Labs  03/19/15 1340  BNP 158.0*    ProBNP (last 3 results) No results for input(s): PROBNP in the last 8760  hours.  CBG: No results for input(s): GLUCAP in the last 168 hours.  No results found for this or any previous visit (from the past 240 hour(s)).   Studies: Dg Chest 2 View  03/19/2015   CLINICAL DATA:  Shortness of breath. Bilateral lower extremity swelling. Cough. Symptoms for 4 days.  EXAM: CHEST  2 VIEW  COMPARISON:  PA and lateral chest 03/27/2007.  FINDINGS: Heart size and mediastinal contours are within normal limits. Both lungs are clear. Visualized skeletal structures are unremarkable.  IMPRESSION: Negative exam.   Electronically Signed   By: Drusilla Kanner M.D.   On: 03/19/2015 16:11   US Abdomen Limited  03/20/2015   CLINICAL DATA:  Ascites.  EXAM: LIMITED ABDOMEN ULTRASOUND FOR ASCITES  TECHNIQUE: Limited  ultrasound survey for ascites was performed in all four abdominal quadrants.  COMPARISON:  03/19/2015.  FINDINGS: Minimal ascites noted. Paracentesis not performed. A follow-up exam can be obtained as needed .  IMPRESSION: Minimal ascites noted.  Paracentesis not performed.   Electronically Signed   By: Maisie Fus  Register   On: 03/20/2015 09:27   US Abdomen Limited  03/19/2015   CLINICAL DATA:  The shortness of breath and right upper quadrant abdominal pain. History of liver biopsy. Evaluate for ascites.  EXAM: US ABDOMEN LIMITED - RIGHT UPPER QUADRANT  COMPARISON:  MRCP 03/16/2015.  Abdominal CT 02/11/2015.  FINDINGS: Gallbladder:  There is mild nonspecific gallbladder wall thickening to 4 mm. No evidence of gallstones, focal pericholecystic fluid or sonographic Murphy's sign.  Common bile duct:  Diameter: 2.4 mm  Liver:  The liver is shrunken with contour irregularity and increased echogenicity, corresponding with cirrhosis on prior studies. No focal lesions identified.  Moderate ascites identified in all 4 quadrants of the abdomen.  IMPRESSION: 1. Moderate ascites as demonstrated on recent prior studies. 2. Cirrhosis. 3. Mild nonspecific gallbladder wall thickening attributed to liver disease and ascites.   Electronically Signed   By: Carey Bullocks M.D.   On: 03/19/2015 17:44    Scheduled Meds: . albumin human  50 g Intravenous Daily  . furosemide  40 mg Intravenous Q12H  . levETIRAcetam  500 mg Oral BID  . levothyroxine  75 mcg Oral QAC breakfast  . loratadine  10 mg Oral Daily  . sodium chloride  3 mL Intravenous Q12H  . spironolactone  100 mg Oral BID  . ursodiol  300 mg Oral TID   Continuous Infusions:   Principal Problem:   Dyspnea Active Problems:   Hypothyroidism   Seizure disorder   Cirrhosis of liver with ascites   Ascites   Thrombocytopenia   Cirrhosis    Time spent: 25 minutes. Greater than 50% of this time was spent in direct contact with the patient coordinating  care.    Cynthia Stephenson  [facility] Pager [phone]  If 7PM-7AM, please contact night-coverage at [email or web address], password [facility] 03/21/2015, 2:30 PM  LOS: 2 days

## 2015-03-21 NOTE — Progress Notes (Signed)
Patient states she feels much better today. Tolerating diet. Legs feel less swollen.  Echo done; results pending. Serum AMA pending   Vital signs in last 24 hours: Temp:  [98 F (36.7 C)-98.7 F (37.1 C)] 98.6 F (37 C) (04/09 0549) Pulse Rate:  [78-92] 78 (04/09 0549) Resp:  [19-20] 20 (04/09 0549) BP: (125-144)/(59-66) 125/59 mmHg (04/09 0549) SpO2:  [96 %-97 %] 97 % (04/09 0549) Weight:  [248 lb 4.8 oz (112.628 kg)] 248 lb 4.8 oz (112.628 kg) (04/09 0549) Last BM Date: 03/20/15   She has lost 12 pounds the past 48 hours General:   Alert,   pleasant and cooperative in NAD. She is well oriented. No asterixis. Abdomen:  Nondistended.  Normal bowel sounds, without guarding, and without rebound.  No mass or organomegaly. No shifting dullness or fluid wave. Extremities:  1+ pitting edema   Intake/Output from previous day: 04/08 0701 - 04/09 0700 In: 1040 [P.O.:840; IV Piggyback:200] Out: 5050 [Urine:5050] Intake/Output this shift: Total I/O In: -  Out: 200 [Urine:200]  Lab Results:  Recent Labs  03/19/15 1340 03/20/15 0701  WBC 7.6 8.0  HGB 11.8* 10.7*  HCT 34.7* 30.9*  PLT 146* 120*   BMET  Recent Labs  03/19/15 1340 03/20/15 0701 03/21/15 0707  NA 134* 136 138  K 4.3 4.4 4.2  CL 103 105 103  CO2 23 24 26   GLUCOSE 125* 84 79  BUN 14 14 16   CREATININE 0.63 0.67 0.72  CALCIUM 8.5 8.3* 8.7   LFT  Recent Labs  03/20/15 0701  PROT 5.7*  ALBUMIN 2.1*  AST 72*  ALT 35  ALKPHOS 113  BILITOT 8.9*   PT/INR  Recent Labs  03/19/15 1340 03/21/15 0707  LABPROT 18.7* 18.9*  INR 1.55* 1.57*      Impression:  Decompensated chronic liver disease with cholestasis and fluid overload. Etiology of cirrhosis not well defined even with recent biopsy. She does not appear to be running a fulminant course. Total bilirubin improved. No improvement in INR with vitamin K. She is diuresing nicely with albumin and furosemide.  Recommendations:  Continue albumin and  diuretic therapy over the weekend as orchestrated by Dr. Karilyn Cotaehman  Repeat hepatic function profile April 11.  Follow up on pending studies. [facility]. Would anticipate discharge in about 48 hours. Increase ambulation.

## 2015-03-22 NOTE — Progress Notes (Signed)
[facility] PROGRESS NOTE  Cynthia CravenLinda Stephenson ZOX:[id number] DOB: 1966-04-13 DOA: 03/19/2015 PCP: Catalina PizzaHALL, ZACH, MD  Assessment/Plan: Cirrhosis of the liver -Of unclear etiology despite liver biopsy. -Serum AMA pending. -She is diuresing nicely with furosemide and albumin. -Is 9+ liters negative since admission. -2-D echo shows ejection fraction of 60-65% with abnormal diastolic function, no wall motion abnormalities. -INR did not improve with vitamin K. -Bilirubin level has decreased. -[facility] Dr. Karilyn Cotaehman.  Seizure disorder -Continue Keppra, no active seizures while in the hospital.  Hypothyroidism -Continue Synthroid.  Dyspnea -Secondary to decompensated cirrhosis, resolved, has had significant diuresis of over 9 L on Lasix and albumin. -To receive last dose of albumin today.    Code Status: Full Code Family Communication:  husband at bedside updated on plan of care. Disposition Plan: Home when ready; likely in 24 hours    Consultants:  GI   Antibiotics:  None   Subjective:  Continues to state that she has been to the bathroom frequently. Denies any shortness of breath or chest pain, still wonders why she has lower extremity edema despite massive amounts of diuresis.  Objective: Filed Vitals:   03/21/15 0549 03/21/15 1502 03/21/15 2154 03/22/15 0543  BP: 125/59 128/57 119/54 132/52  Pulse: 78 84 83 76  Temp: 98.6 F (37 C) 98.4 F (36.9 C) 97.5 F (36.4 C) 98.3 F (36.8 C)  TempSrc: Oral Oral Oral Oral  Resp: 20 20 20 20   Height:      Weight: 112.628 kg (248 lb 4.8 oz)   111.086 kg (244 lb 14.4 oz)  SpO2: 97% 97% 96% 97%    Intake/Output Summary (Last 24 hours) at 03/22/15 1125 Last data filed at 03/22/15 1112  Gross per 24 hour  Intake     10 ml  Output   5400 ml  Net  -5390 ml   Filed Weights   03/20/15 0619 03/21/15 0549 03/22/15 0543  Weight: 116.393 kg (256 lb 9.6 oz) 112.628 kg (248 lb  4.8 oz) 111.086 kg (244 lb 14.4 oz)    Exam:   General:  alert, awake, oriented 3, no distress   Cardiovascular: regular rate and rhythm, no murmurs, rubs or gallops   Respiratory:There to auscultation bilaterally  Abdomen:  soft, positive bowel sounds  Extremities: 2+ pitting edema, purple, macular rash over anterior aspect of both lower extremities   Neurologic:  Intact/non-focal  Data Reviewed: Basic Metabolic Panel:  Recent Labs Lab 03/17/15 1141 03/19/15 1340 03/20/15 0701 03/21/15 0707  NA 135 134* 136 138  K 3.9 4.3 4.4 4.2  CL 103 103 105 103  CO2 22 23 24 26   GLUCOSE 91 125* 84 79  BUN 12 14 14 16   CREATININE 0.55 0.63 0.67 0.72  CALCIUM 8.0* 8.5 8.3* 8.7  MG  --   --   --  2.0   Liver Function Tests:  Recent Labs Lab 03/17/15 1141 03/19/15 1340 03/20/15 0701  AST 73* 85* 72*  ALT 35 39* 35  ALKPHOS 126* 126* 113  BILITOT 10.4* 10.5* 8.9*  PROT 5.7* 6.5 5.7*  ALBUMIN 2.5* 2.4* 2.1*   No results for input(s): LIPASE, AMYLASE in the last 168 hours.  Recent Labs Lab 03/20/15 0614  AMMONIA 22   CBC:  Recent Labs Lab 03/17/15 1141 03/19/15 1340 03/20/15 0701  WBC 6.8 7.6 8.0  NEUTROABS  --  4.5  --   HGB 11.7* 11.8* 10.7*  HCT 33.6* 34.7*  30.9*  MCV 89.8 92.3 91.7  PLT 146* 146* 120*   Cardiac Enzymes:  Recent Labs Lab 03/19/15 1340  TROPONINI <0.03   BNP (last 3 results)  Recent Labs  03/19/15 1340  BNP 158.0*    ProBNP (last 3 results) No results for input(s): PROBNP in the last 8760 hours.  CBG: No results for input(s): GLUCAP in the last 168 hours.  No results found for this or any previous visit (from the past 240 hour(s)).   Studies: No results found.  Scheduled Meds: . albumin human  50 g Intravenous Daily  . furosemide  40 mg Intravenous Q12H  . levETIRAcetam  500 mg Oral BID  . levothyroxine  75 mcg Oral QAC breakfast  . loratadine  10 mg Oral Daily  . sodium chloride  3 mL Intravenous Q12H  .  spironolactone  100 mg Oral BID  . ursodiol  300 mg Oral TID   Continuous Infusions:   Principal Problem:   Dyspnea Active Problems:   Hypothyroidism   Seizure disorder   Cirrhosis of liver with ascites   Ascites   Thrombocytopenia   Cirrhosis    Time spent: 25 minutes. Greater than 50% of this time was spent in direct contact with the patient coordinating care.    Chaya Jan  [facility] Pager [phone]  If 7PM-7AM, please contact night-coverage at [email or web address], password [facility] 03/22/2015, 11:25 AM  LOS: 3 days

## 2015-03-22 NOTE — Progress Notes (Addendum)
Continues to feel better. Diuresing nicely. Ambulating. Church friends visiting when I came to see her this afternoon.  Vital signs in last 24 hours: Temp:  [97.5 F (36.4 C)-98.4 F (36.9 C)] 97.9 F (36.6 C) (04/10 1415) Pulse Rate:  [76-84] 79 (04/10 1415) Resp:  [20] 20 (04/10 1415) BP: (119-132)/(52-63) 126/63 mmHg (04/10 1415) SpO2:  [95 %-97 %] 95 % (04/10 1415) Weight:  [244 lb 14.4 oz (111.086 kg)] 244 lb 14.4 oz (111.086 kg) (04/10 0543) Last BM Date: 03/20/15 General:   Alert,  Well-developed, well-nourished, pleasant and cooperative in NAD Abdomen:  Soft, nontender and nondistended.  Normal bowel sounds, without guarding, and without rebound.  No mass or organomegaly. Extremities:  Trace edema  Intake/Output from previous day: 04/09 0701 - 04/10 0700 In: 3 [I.V.:3] Out: 4300 [Urine:4300] Intake/Output this shift: Total I/O In: 10 [I.V.:10] Out: 1750 [Urine:1750]  Lab Results:  Recent Labs  03/20/15 0701  WBC 8.0  HGB 10.7*  HCT 30.9*  PLT 120*   BMET  Recent Labs  03/20/15 0701 03/21/15 0707  NA 136 138  K 4.4 4.2  CL 105 103  CO2 24 26  GLUCOSE 84 79  BUN 14 16  CREATININE 0.67 0.72  CALCIUM 8.3* 8.7    Impression: Decompensated chronic liver disease with anasarca-much improved with albumin/furosemide/spironolactone.   Recommendations:   Repeat hepatic profile tomorrow morning. Follow-up on echo and serum AMA. I would anticipate hospital discharge tomorrow. [facility]. Dr. Cathie Beamsehmans will check in on in the morning.

## 2015-03-23 LAB — PROTIME-INR
INR: 1.61 — AB (ref 0.00–1.49)
PROTHROMBIN TIME: 19.3 s — AB (ref 11.6–15.2)

## 2015-03-23 LAB — HEPATIC FUNCTION PANEL
ALK PHOS: 99 U/L (ref 39–117)
ALT: 29 U/L (ref 0–35)
AST: 67 U/L — ABNORMAL HIGH (ref 0–37)
Albumin: 3.1 g/dL — ABNORMAL LOW (ref 3.5–5.2)
BILIRUBIN DIRECT: 5.2 mg/dL — AB (ref 0.0–0.5)
BILIRUBIN INDIRECT: 4.1 mg/dL — AB (ref 0.3–0.9)
BILIRUBIN TOTAL: 9.3 mg/dL — AB (ref 0.3–1.2)
Total Protein: 6.3 g/dL (ref 6.0–8.3)

## 2015-03-23 LAB — IGG 4: IgG, Subclass 4: 26 mg/dL (ref 1–291)

## 2015-03-23 MED ORDER — URSODIOL 300 MG PO CAPS: 300.0000 mg | ORAL_CAPSULE | Freq: Three times a day (TID) | ORAL | Status: DC

## 2015-03-23 MED ORDER — FUROSEMIDE 20 MG PO TABS
40.0000 mg | ORAL_TABLET | Freq: Two times a day (BID) | ORAL | Status: DC
Start: 2015-03-23 — End: 2015-05-04

## 2015-03-23 MED ORDER — SPIRONOLACTONE 100 MG PO TABS: 100.0000 mg | ORAL_TABLET | Freq: Two times a day (BID) | ORAL | Status: DC

## 2015-03-23 NOTE — Progress Notes (Signed)
Pt's IV catheter removed and intact. Pt's IV site clean, dry, and intact. Discharge instructions and medications reviewed and discussed with patient. All follow up appointments were reviewed and discussed with patient. All questions were answered and no further questions at this time. Pt escorted by nurse.

## 2015-03-23 NOTE — Discharge Summary (Signed)
Physician Discharge Summary  Cynthia Stephenson ZOX:[id number] DOB: 31-Mar-1966 DOA: 03/19/2015  PCP: [name], MD  Admit date: 03/19/2015 Discharge date: 03/23/2015  Time spent: 40 minutes  Recommendations for Outpatient Follow-up:  1. Follow up with [facility] for evaluation of transplant. Dr Karilyn Cota arranging and [facility] will contact patient at home 2. none  Discharge Diagnoses:  Principal Problem:   Dyspnea Active Problems:   Hypothyroidism   Seizure disorder   Cirrhosis of liver with ascites   Ascites   Thrombocytopenia   Cirrhosis   Discharge Condition: stable  Diet recommendation: heart healthy  Filed Weights   03/21/15 0549 03/22/15 0543 03/23/15 0700  Weight: 112.628 kg (248 lb 4.8 oz) 111.086 kg (244 lb 14.4 oz) 108.909 kg (240 lb 1.6 oz)    History of present illness:  This is a very pleasant 49 year old lady who had been diagnosed recently with cirrhosis, nonalcoholic, of the liver and she was seen by gastroenterology a few days prior to presentation in ED on 03/19/15 with symptoms of abdominal distention, dyspnea and leg swelling. She did not have abdominal pain or fever. She denied chest pain. She had never had a paracentesis. Her gastroenterologist referred her to [facility] for tertiary specialists care. Her bilirubins had been rising. There was no history of altered mental status.   Hospital Course:  Cirrhosis of the liver -Of unclear etiology despite liver biopsy. -Serum AMA pending. -She was admitted and provided with furosemide and albumin.. -Is -13.7 liters negative since admission. Weight down 20lbs -2-D echo shows ejection fraction of 60-65% with abnormal diastolic function, no wall motion abnormalities. -INR did not improve with vitamin K and is 1.6 on discharge -Bilirubin level has decreased only slightly -Referral to the liver clinic at [facility] coordinated by Dr. Karilyn Cota who indicates [facility] will contact patient  Seizure disorder -No active seizures while in the  hospital.  Hypothyroidism -Continue Synthroid.  Dyspnea -Secondary to decompensated cirrhosis, resolved with above treatment.  - Dr Karilyn Cota recommends lasix  BiD at discharge   Procedures:  none  Consultations:  Dr Karilyn Cota gastroenterology  Discharge Exam: Filed Vitals:   03/23/15 0700  BP: 129/60  Pulse: 76  Temp: 97.8 F (36.6 C)  Resp: 19    General: obese appears comfortable Cardiovascular: rrr no MGR trace-1+ edema Respiratory: normal effort BS clear bilaterally to auscultation Extremities: macular rash bilateral LE anterior   Discharge Instructions   Discharge Instructions    Diet - low sodium heart healthy    Complete by:  As directed      Increase activity slowly    Complete by:  As directed           Current Discharge Medication List    START taking these medications   Details  ursodiol (ACTIGALL) 300 MG capsule Take 1 capsule (300 mg total) by mouth 3 (three) times daily. Qty: 30 capsule, Refills: 0      CONTINUE these medications which have CHANGED   Details  furosemide (LASIX) 20 MG tablet Take 2 tablets (40 mg total) by mouth 2 (two) times daily. Qty: 30 tablet, Refills: 3   Associated Diagnoses: Cirrhosis of liver without ascites, unspecified hepatic cirrhosis type; Jaundice    spironolactone (ALDACTONE) 100 MG tablet Take 1 tablet (100 mg total) by mouth 2 (two) times daily. Qty: 30 tablet, Refills: 3   Associated Diagnoses: NAFLD (nonalcoholic fatty liver disease)      CONTINUE these medications which have NOT CHANGED   Details  levETIRAcetam (KEPPRA) 500 MG tablet Take  1 tablet (500 mg total) by mouth 2 (two) times daily. Qty: 180 tablet, Refills: 4    levothyroxine (SYNTHROID, LEVOTHROID) 75 MCG tablet Take 75 mcg by mouth daily before breakfast.    LORazepam (ATIVAN) 0.5 MG tablet Take 1 tablet (0.5 mg total) by mouth as needed for anxiety. Qty: 30 tablet, Refills: 0    Multiple Vitamin (MULTIVITAMIN WITH MINERALS) TABS  tablet Take 1 tablet by mouth daily.    Powders (ANTI MONKEY BUTT) POWD Apply 1 application topically daily as needed (for moisture).      STOP taking these medications     ursodiol (URSO) 250 MG tablet      fexofenadine (ALLEGRA) 180 MG tablet        No Known Allergies Follow-up Information    Schedule an appointment as soon as possible for a visit in 2 weeks to follow up.   Contact information:   is being referred to [facility] and office will contact ot arrange       The results of significant diagnostics from this hospitalization (including imaging, microbiology, ancillary and laboratory) are listed below for reference.    Significant Diagnostic Studies: Dg Chest 2 View  03/19/2015   CLINICAL DATA:  Shortness of breath. Bilateral lower extremity swelling. Cough. Symptoms for 4 days.  EXAM: CHEST  2 VIEW  COMPARISON:  PA and lateral chest 03/27/2007.  FINDINGS: Heart size and mediastinal contours are within normal limits. Both lungs are clear. Visualized skeletal structures are unremarkable.  IMPRESSION: Negative exam.   Electronically Signed   By: Drusilla Kanner M.D.   On: 03/19/2015 16:11   Mr Abdomen W Wo Contrast  03/17/2015   CLINICAL DATA:  Nonalcoholic cirrhosis. Hepatic steatosis. Jaundice epigastric pain.  EXAM: MRI ABDOMEN WITHOUT AND WITH CONTRAST  TECHNIQUE: Multiplanar multisequence MR imaging of the abdomen was performed both before and after the administration of intravenous contrast.  CONTRAST:  20mL MULTIHANCE GADOBENATE DIMEGLUMINE 529 MG/ML IV SOLN  COMPARISON:  CT 02/11/2015  FINDINGS: Lower chest:  Lung bases are clear.  Hepatobiliary: The liver have is a nodular contour and is reduced in volume. The caudate lobe is mildly enlarged. No enhancing lesion within the liver. There multiple small nonenhancing lesion which likely represent regenerating nodules. The portal veins are patent. The gallbladder is mildly distended at 3.6 cm. No biliary duct dilatation. Recanalization  of the umbilical vein.  Exam is degraded by motion and ascites. There is common bile duct is difficult to assess does not appear enlarged.  Pancreas: Pancreatic parenchyma was also difficult to assess as there is loss of dropout of signal centrally within the abdomen on may be sequences. No peripancreatic fluid collections. No duct dilatation.  Spleen: Normal spleen  Adrenals/urinary tract: Adrenal glands and kidneys are normal.  Stomach/Bowel: Stomach and limited of the small bowel is unremarkable  Vascular/Lymphatic: Abdominal aortic normal caliber. No retroperitoneal periportal lymphadenopathy.  Musculoskeletal: No aggressive osseous lesion  IMPRESSION: 1. Exam is limited by technique with loss of signal centrally in the abdomen on many sequences. 2. Morphologic changes of liver consists with cirrhosis. No enhancing lesions to suggests hepatoma. Portal veins are patent. 3. Moderate volume ascites. 4. No gross evidence of biliary obstruction. 5. Pancreas appears normal. 6. Evidence of portal hypertension with recanalization of the umbilical vein   Electronically Signed   By: Genevive Bi M.D.   On: 03/17/2015 08:13   US Abdomen Limited  03/20/2015   CLINICAL DATA:  Ascites.  EXAM: LIMITED ABDOMEN ULTRASOUND FOR  ASCITES  TECHNIQUE: Limited ultrasound survey for ascites was performed in all four abdominal quadrants.  COMPARISON:  03/19/2015.  FINDINGS: Minimal ascites noted. Paracentesis not performed. A follow-up exam can be obtained as needed .  IMPRESSION: Minimal ascites noted.  Paracentesis not performed.   Electronically Signed   By: Maisie Fushomas  Register   On: 03/20/2015 09:27   [place] Abdomen Limited  03/19/2015   CLINICAL DATA:  The shortness of breath and right upper quadrant abdominal pain. History of liver biopsy. Evaluate for ascites.  EXAM: US ABDOMEN LIMITED - RIGHT UPPER QUADRANT  COMPARISON:  MRCP 03/16/2015.  Abdominal CT 02/11/2015.  FINDINGS: Gallbladder:  There is mild nonspecific gallbladder  wall thickening to 4 mm. No evidence of gallstones, focal pericholecystic fluid or sonographic Murphy's sign.  Common bile duct:  Diameter: 2.4 mm  Liver:  The liver is shrunken with contour irregularity and increased echogenicity, corresponding with cirrhosis on prior studies. No focal lesions identified.  Moderate ascites identified in all 4 quadrants of the abdomen.  IMPRESSION: 1. Moderate ascites as demonstrated on recent prior studies. 2. Cirrhosis. 3. Mild nonspecific gallbladder wall thickening attributed to liver disease and ascites.   Electronically Signed   By: Carey BullocksWilliam  Veazey M.D.   On: 03/19/2015 17:44    Microbiology: No results found for this or any previous visit (from the past 240 hour(s)).   Labs: Basic Metabolic Panel:  Recent Labs Lab 03/17/15 1141 03/19/15 1340 03/20/15 0701 03/21/15 0707  NA 135 134* 136 138  K 3.9 4.3 4.4 4.2  CL 103 103 105 103  CO2 22 23 24 26   GLUCOSE 91 125* 84 79  BUN 12 14 14 16   CREATININE 0.55 0.63 0.67 0.72  CALCIUM 8.0* 8.5 8.3* 8.7  MG  --   --   --  2.0   Liver Function Tests:  Recent Labs Lab 03/17/15 1141 03/19/15 1340 03/20/15 0701 03/23/15 0904  AST 73* 85* 72* 67*  ALT 35 39* 35 29  ALKPHOS 126* 126* 113 99  BILITOT 10.4* 10.5* 8.9* 9.3*  PROT 5.7* 6.5 5.7* 6.3  ALBUMIN 2.5* 2.4* 2.1* 3.1*   No results for input(s): LIPASE, AMYLASE in the last 168 hours.  Recent Labs Lab 03/20/15 0614  AMMONIA 22   CBC:  Recent Labs Lab 03/17/15 1141 03/19/15 1340 03/20/15 0701  WBC 6.8 7.6 8.0  NEUTROABS  --  4.5  --   HGB 11.7* 11.8* 10.7*  HCT 33.6* 34.7* 30.9*  MCV 89.8 92.3 91.7  PLT 146* 146* 120*   Cardiac Enzymes:  Recent Labs Lab 03/19/15 1340  TROPONINI <0.03   BNP: BNP (last 3 results)  Recent Labs  03/19/15 1340  BNP 158.0*    ProBNP (last 3 results) No results for input(s): PROBNP in the last 8760 hours.  CBG: No results for input(s): GLUCAP in the last 168  hours.     SignedGwenyth Bender:  Velda Wendt M  [facility] 03/23/2015, 10:43 AM

## 2015-03-23 NOTE — Care Management Utilization Note (Signed)
UR completed 

## 2015-03-24 ENCOUNTER — Ambulatory Visit (INDEPENDENT_AMBULATORY_CARE_PROVIDER_SITE_OTHER): Payer: BLUE CROSS/BLUE SHIELD | Admitting: Internal Medicine

## 2015-03-24 LAB — ALPHA-1 ANTITRYPSIN PHENOTYPE: A1 ANTITRYPSIN: 108 mg/dL (ref 83–199)

## 2015-04-02 ENCOUNTER — Encounter (INDEPENDENT_AMBULATORY_CARE_PROVIDER_SITE_OTHER): Payer: Self-pay | Admitting: Internal Medicine

## 2015-04-02 ENCOUNTER — Other Ambulatory Visit (INDEPENDENT_AMBULATORY_CARE_PROVIDER_SITE_OTHER): Payer: Self-pay | Admitting: Internal Medicine

## 2015-04-02 MED ORDER — URSODIOL 300 MG PO CAPS: 300.0000 mg | ORAL_CAPSULE | Freq: Three times a day (TID) | ORAL | Status: DC

## 2015-04-03 ENCOUNTER — Other Ambulatory Visit (INDEPENDENT_AMBULATORY_CARE_PROVIDER_SITE_OTHER): Payer: Self-pay | Admitting: Internal Medicine

## 2015-04-03 MED ORDER — URSODIOL 300 MG PO CAPS: 300.0000 mg | ORAL_CAPSULE | Freq: Three times a day (TID) | ORAL | Status: DC

## 2015-04-03 NOTE — Telephone Encounter (Signed)
Will fax Rx.  

## 2015-04-03 NOTE — Telephone Encounter (Signed)
Rx sent 

## 2015-04-08 ENCOUNTER — Other Ambulatory Visit (HOSPITAL_COMMUNITY)
Admission: RE | Admit: 2015-04-08 | Discharge: 2015-04-08 | Disposition: A | Discharge status: Home / Self Care | Payer: BLUE CROSS/BLUE SHIELD | Source: Ambulatory Visit | Attending: Internal Medicine | Admitting: Internal Medicine

## 2015-04-08 DIAGNOSIS — R188 Other ascites: Secondary | ICD-10-CM | POA: Diagnosis not present

## 2015-04-08 DIAGNOSIS — K7469 Other cirrhosis of liver: Secondary | ICD-10-CM | POA: Diagnosis present

## 2015-04-08 DIAGNOSIS — K7290 Hepatic failure, unspecified without coma: Secondary | ICD-10-CM | POA: Diagnosis not present

## 2015-04-08 LAB — BASIC METABOLIC PANEL
ANION GAP: 9 (ref 5–15)
BUN: 23 mg/dL (ref 6–23)
CO2: 24 mmol/L (ref 19–32)
CREATININE: 0.79 mg/dL (ref 0.50–1.10)
Calcium: 9 mg/dL (ref 8.4–10.5)
Chloride: 96 mmol/L (ref 96–112)
GFR calc non Af Amer: >90 mL/min (ref >90)
Glucose, Bld: 98 mg/dL (ref 70–99)
Potassium: 4.7 mmol/L (ref 3.5–5.1)
SODIUM: 129 mmol/L — AB (ref 135–145)

## 2015-04-08 LAB — CBC
HCT: 34.5 % — ABNORMAL LOW (ref 36.0–46.0)
Hemoglobin: 11.8 g/dL — ABNORMAL LOW (ref 12.0–15.0)
MCH: 33 pg (ref 26.0–34.0)
MCHC: 34.2 g/dL (ref 30.0–36.0)
MCV: 96.4 fL (ref 78.0–100.0)
PLATELETS: 144 10*3/uL — AB (ref 150–400)
RBC: 3.58 MIL/uL — AB (ref 3.87–5.11)
RDW: 20.9 % — AB (ref 11.5–15.5)
WBC: 9.1 10*3/uL (ref 4.0–10.5)

## 2015-04-08 LAB — PROTIME-INR
INR: 1.41 (ref 0.00–1.49)
Prothrombin Time: 17.4 seconds — ABNORMAL HIGH (ref 11.6–15.2)

## 2015-04-08 LAB — HEPATIC FUNCTION PANEL
ALK PHOS: 123 U/L — AB (ref 39–117)
ALT: 53 U/L — AB (ref 0–35)
AST: 92 U/L — ABNORMAL HIGH (ref 0–37)
Albumin: 3.2 g/dL — ABNORMAL LOW (ref 3.5–5.2)
BILIRUBIN DIRECT: 5.1 mg/dL — AB (ref 0.0–0.5)
Indirect Bilirubin: 4.5 mg/dL — ABNORMAL HIGH (ref 0.3–0.9)
Total Bilirubin: 9.6 mg/dL — ABNORMAL HIGH (ref 0.3–1.2)
Total Protein: 7.3 g/dL (ref 6.0–8.3)

## 2015-04-08 LAB — APTT: aPTT: 35 seconds (ref 24–37)

## 2015-04-09 ENCOUNTER — Telehealth (INDEPENDENT_AMBULATORY_CARE_PROVIDER_SITE_OTHER): Payer: Self-pay | Admitting: *Deleted

## 2015-04-09 ENCOUNTER — Other Ambulatory Visit (INDEPENDENT_AMBULATORY_CARE_PROVIDER_SITE_OTHER): Payer: Self-pay | Admitting: *Deleted

## 2015-04-09 ENCOUNTER — Encounter (INDEPENDENT_AMBULATORY_CARE_PROVIDER_SITE_OTHER): Payer: Self-pay | Admitting: *Deleted

## 2015-04-09 DIAGNOSIS — K760 Fatty (change of) liver, not elsewhere classified: Secondary | ICD-10-CM

## 2015-04-09 DIAGNOSIS — K7460 Unspecified cirrhosis of liver: Secondary | ICD-10-CM

## 2015-04-09 NOTE — Telephone Encounter (Signed)
.  Per Terri [name],NP patient is to have lab work in 4 weeks  

## 2015-04-14 ENCOUNTER — Ambulatory Visit (INDEPENDENT_AMBULATORY_CARE_PROVIDER_SITE_OTHER): Payer: BLUE CROSS/BLUE SHIELD | Admitting: Internal Medicine

## 2015-04-15 ENCOUNTER — Encounter (INDEPENDENT_AMBULATORY_CARE_PROVIDER_SITE_OTHER): Payer: Self-pay

## 2015-04-21 ENCOUNTER — Telehealth: Payer: Self-pay

## 2015-04-21 NOTE — Telephone Encounter (Signed)
Pt was called to reschedule appointment from bump list.  VM was left to inform pt to call back.  When rescheduling please schedule with Dr. Marjory LiesPenumalli in a 30 min slot.  Thanks

## 2015-04-28 ENCOUNTER — Encounter (HOSPITAL_COMMUNITY): Payer: Self-pay | Admitting: Emergency Medicine

## 2015-04-28 ENCOUNTER — Inpatient Hospital Stay (HOSPITAL_COMMUNITY)
Admission: EM | Admit: 2015-04-28 | Discharge: 2015-05-04 | DRG: 433 | Disposition: A | Discharge status: Home / Self Care | Payer: BLUE CROSS/BLUE SHIELD | Attending: Internal Medicine | Admitting: Internal Medicine

## 2015-04-28 DIAGNOSIS — R0600 Dyspnea, unspecified: Secondary | ICD-10-CM | POA: Diagnosis present

## 2015-04-28 DIAGNOSIS — R569 Unspecified convulsions: Secondary | ICD-10-CM | POA: Diagnosis present

## 2015-04-28 DIAGNOSIS — K449 Diaphragmatic hernia without obstruction or gangrene: Secondary | ICD-10-CM | POA: Diagnosis present

## 2015-04-28 DIAGNOSIS — R188 Other ascites: Secondary | ICD-10-CM | POA: Diagnosis present

## 2015-04-28 DIAGNOSIS — E8770 Fluid overload, unspecified: Secondary | ICD-10-CM | POA: Diagnosis present

## 2015-04-28 DIAGNOSIS — E039 Hypothyroidism, unspecified: Secondary | ICD-10-CM | POA: Diagnosis present

## 2015-04-28 DIAGNOSIS — D6959 Other secondary thrombocytopenia: Secondary | ICD-10-CM | POA: Diagnosis present

## 2015-04-28 DIAGNOSIS — K7290 Hepatic failure, unspecified without coma: Secondary | ICD-10-CM | POA: Diagnosis present

## 2015-04-28 DIAGNOSIS — Z23 Encounter for immunization: Secondary | ICD-10-CM

## 2015-04-28 DIAGNOSIS — G40909 Epilepsy, unspecified, not intractable, without status epilepticus: Secondary | ICD-10-CM

## 2015-04-28 DIAGNOSIS — M7989 Other specified soft tissue disorders: Secondary | ICD-10-CM | POA: Diagnosis present

## 2015-04-28 DIAGNOSIS — D696 Thrombocytopenia, unspecified: Secondary | ICD-10-CM | POA: Diagnosis not present

## 2015-04-28 DIAGNOSIS — K2980 Duodenitis without bleeding: Secondary | ICD-10-CM | POA: Diagnosis present

## 2015-04-28 DIAGNOSIS — Z7682 Awaiting organ transplant status: Secondary | ICD-10-CM | POA: Diagnosis not present

## 2015-04-28 DIAGNOSIS — Z8379 Family history of other diseases of the digestive system: Secondary | ICD-10-CM

## 2015-04-28 DIAGNOSIS — K259 Gastric ulcer, unspecified as acute or chronic, without hemorrhage or perforation: Secondary | ICD-10-CM | POA: Diagnosis present

## 2015-04-28 DIAGNOSIS — Z8249 Family history of ischemic heart disease and other diseases of the circulatory system: Secondary | ICD-10-CM | POA: Diagnosis not present

## 2015-04-28 DIAGNOSIS — Z833 Family history of diabetes mellitus: Secondary | ICD-10-CM

## 2015-04-28 DIAGNOSIS — K760 Fatty (change of) liver, not elsewhere classified: Secondary | ICD-10-CM | POA: Diagnosis present

## 2015-04-28 DIAGNOSIS — R21 Rash and other nonspecific skin eruption: Secondary | ICD-10-CM | POA: Diagnosis present

## 2015-04-28 DIAGNOSIS — R609 Edema, unspecified: Secondary | ICD-10-CM | POA: Diagnosis present

## 2015-04-28 DIAGNOSIS — Z803 Family history of malignant neoplasm of breast: Secondary | ICD-10-CM | POA: Diagnosis not present

## 2015-04-28 DIAGNOSIS — Z6841 Body Mass Index (BMI) 40.0 and over, adult: Secondary | ICD-10-CM

## 2015-04-28 DIAGNOSIS — K7460 Unspecified cirrhosis of liver: Secondary | ICD-10-CM | POA: Diagnosis present

## 2015-04-28 DIAGNOSIS — I8500 Esophageal varices without bleeding: Secondary | ICD-10-CM | POA: Diagnosis not present

## 2015-04-28 DIAGNOSIS — E669 Obesity, unspecified: Secondary | ICD-10-CM | POA: Diagnosis present

## 2015-04-28 DIAGNOSIS — Z808 Family history of malignant neoplasm of other organs or systems: Secondary | ICD-10-CM | POA: Diagnosis not present

## 2015-04-28 DIAGNOSIS — K3189 Other diseases of stomach and duodenum: Secondary | ICD-10-CM | POA: Diagnosis present

## 2015-04-28 DIAGNOSIS — Z8041 Family history of malignant neoplasm of ovary: Secondary | ICD-10-CM | POA: Diagnosis not present

## 2015-04-28 DIAGNOSIS — K766 Portal hypertension: Secondary | ICD-10-CM | POA: Diagnosis not present

## 2015-04-28 LAB — CBC WITH DIFFERENTIAL/PLATELET
Basophils Absolute: 0.1 10*3/uL (ref 0.0–0.1)
Basophils Relative: 1 % (ref 0–1)
EOS ABS: 0.3 10*3/uL (ref 0.0–0.7)
Eosinophils Relative: 3 % (ref 0–5)
HCT: 31.8 % — ABNORMAL LOW (ref 36.0–46.0)
Hemoglobin: 10.8 g/dL — ABNORMAL LOW (ref 12.0–15.0)
LYMPHS ABS: 2 10*3/uL (ref 0.7–4.0)
Lymphocytes Relative: 24 % (ref 12–46)
MCH: 34.1 pg — AB (ref 26.0–34.0)
MCHC: 34 g/dL (ref 30.0–36.0)
MCV: 100.3 fL — AB (ref 78.0–100.0)
MONO ABS: 0.9 10*3/uL (ref 0.1–1.0)
MONOS PCT: 11 % (ref 3–12)
Neutro Abs: 5 10*3/uL (ref 1.7–7.7)
Neutrophils Relative %: 61 % (ref 43–77)
PLATELETS: 111 10*3/uL — AB (ref 150–400)
RBC: 3.17 MIL/uL — ABNORMAL LOW (ref 3.87–5.11)
RDW: 20.3 % — AB (ref 11.5–15.5)
WBC: 8.3 10*3/uL (ref 4.0–10.5)

## 2015-04-28 LAB — COMPREHENSIVE METABOLIC PANEL
ALT: 44 U/L (ref 14–54)
ANION GAP: 6 (ref 5–15)
AST: 84 U/L — ABNORMAL HIGH (ref 15–41)
Albumin: 2.4 g/dL — ABNORMAL LOW (ref 3.5–5.0)
Alkaline Phosphatase: 129 U/L — ABNORMAL HIGH (ref 38–126)
BUN: 14 mg/dL (ref 6–20)
CO2: 27 mmol/L (ref 22–32)
CREATININE: 0.63 mg/dL (ref 0.44–1.00)
Calcium: 8.3 mg/dL — ABNORMAL LOW (ref 8.9–10.3)
Chloride: 100 mmol/L — ABNORMAL LOW (ref 101–111)
GLUCOSE: 96 mg/dL (ref 65–99)
Potassium: 4.3 mmol/L (ref 3.5–5.1)
SODIUM: 133 mmol/L — AB (ref 135–145)
Total Bilirubin: 8.4 mg/dL — ABNORMAL HIGH (ref 0.3–1.2)
Total Protein: 5.9 g/dL — ABNORMAL LOW (ref 6.5–8.1)

## 2015-04-28 LAB — PROTIME-INR
INR: 1.55 — AB (ref 0.00–1.49)
PROTHROMBIN TIME: 18.7 s — AB (ref 11.6–15.2)

## 2015-04-28 MED ORDER — SODIUM CHLORIDE 0.9 % IJ SOLN: 3.0000 mL | INTRAMUSCULAR | Status: DC | PRN

## 2015-04-28 MED ORDER — LORAZEPAM 0.5 MG PO TABS: 0.5000 mg | ORAL_TABLET | ORAL | Status: DC | PRN

## 2015-04-28 MED ORDER — LEVETIRACETAM 500 MG PO TABS
500.0000 mg | ORAL_TABLET | Freq: Two times a day (BID) | ORAL | Status: DC
  Administered 2015-04-29 – 2015-05-04 (×12): 500 mg via ORAL
  Filled 2015-04-28 (×12): qty 1

## 2015-04-28 MED ORDER — SODIUM CHLORIDE 0.9 % IV SOLN: 250.0000 mL | INTRAVENOUS | Status: DC | PRN

## 2015-04-28 MED ORDER — LEVOTHYROXINE SODIUM 75 MCG PO TABS
75.0000 ug | ORAL_TABLET | Freq: Every day | ORAL | Status: DC
  Administered 2015-04-29 – 2015-05-04 (×6): 75 ug via ORAL
  Filled 2015-04-28 (×6): qty 1

## 2015-04-28 MED ORDER — SPIRONOLACTONE 100 MG PO TABS
100.0000 mg | ORAL_TABLET | Freq: Two times a day (BID) | ORAL | Status: DC
  Administered 2015-04-29 – 2015-05-04 (×12): 100 mg via ORAL
  Filled 2015-04-28 (×12): qty 1

## 2015-04-28 MED ORDER — FUROSEMIDE 10 MG/ML IJ SOLN
60.0000 mg | INTRAMUSCULAR | Status: AC
  Administered 2015-04-28: 60 mg via INTRAVENOUS
  Filled 2015-04-28: qty 6

## 2015-04-28 MED ORDER — SODIUM CHLORIDE 0.9 % IJ SOLN
3.0000 mL | Freq: Two times a day (BID) | INTRAMUSCULAR | Status: DC
  Administered 2015-04-29 – 2015-05-04 (×12): 3 mL via INTRAVENOUS

## 2015-04-28 MED ORDER — LACTULOSE 10 GM/15ML PO SOLN: 20.0000 g | Freq: Every day | ORAL | Status: DC | PRN

## 2015-04-28 MED ORDER — PNEUMOCOCCAL VAC POLYVALENT 25 MCG/0.5ML IJ INJ
0.5000 mL | INJECTION | INTRAMUSCULAR | Status: AC
  Administered 2015-04-29: 0.5 mL via INTRAMUSCULAR
  Filled 2015-04-28: qty 0.5

## 2015-04-28 MED ORDER — FUROSEMIDE 10 MG/ML IJ SOLN
40.0000 mg | Freq: Two times a day (BID) | INTRAMUSCULAR | Status: DC
  Filled 2015-04-28: qty 4

## 2015-04-28 MED ORDER — ONDANSETRON HCL 4 MG/2ML IJ SOLN: 4.0000 mg | Freq: Three times a day (TID) | INTRAMUSCULAR | Status: AC | PRN

## 2015-04-28 NOTE — ED Provider Notes (Signed)
CSN: [id number]     Arrival date & time 04/28/15  1818 History  This chart was scribed for Cynthia HongBrian Jun Osment, MD by Modena JanskyAlbert Thayil, ED Scribe. This patient was seen in room APA05/APA05 and the patient's care was started at 8:55 PM.   Chief Complaint  Patient presents with  . Leg Swelling   The history is provided by the patient. No language interpreter was used.   HPI Comments: Cynthia CravenLinda Stephenson is a 49 y.o. female who presents to the Emergency Department complaining of constant moderate BLE swelling that started 2 weeks ago. She states that she has known liver failure and is trying to get on the transplant list. She reports that she has been admitted in the past due to fluid in LE.  She states that the swelling has been worsening, particularly in the last week - she was on 120mg  of lasix daily but cut back to 60mg  last week.  Seh has gained 32 lbs over the past 5 days. She states that she is supposed to get dieresis. She reports that she was started on spironolactone recently as well. She reports that the swelling is somewhat better in the morning, but then her BLE swelling back within the hour. She states that she has been having fatigue, and intermittent SOB lately in addition to weight gain. She reports having a hx of bleeding problems. She denies any new tremors or blood in stool.   Sx are constant, gradually worsening and not associated with CP / SOB or f/cn/v.  She does get SOB howevere when she lays down.  Past Medical History  Diagnosis Date  . Thyroid disease   . Hypothyroidism   . Seizures   . Cirrhosis   . Thrombocytopenia    Past Surgical History  Procedure Laterality Date  . Abdominal hysterectomy    . Abdominal hysterectomy    . Liver biopsy     Family History  Problem Relation Age of Onset  . Breast cancer Mother   . Ovarian cancer Mother   . Thyroid cancer Mother   . Diabetes Father   . Heart disease Father   . Skin cancer Father   . Celiac disease Paternal Aunt    History   Substance Use Topics  . Smoking status: Never Smoker   . Smokeless tobacco: Never Used  . Alcohol Use: No   OB History    No data available     Review of Systems  Constitutional: Positive for fatigue and unexpected weight change.  Respiratory: Positive for shortness of breath.   Cardiovascular: Positive for leg swelling.  Gastrointestinal: Negative for blood in stool.  Neurological: Negative for tremors.  Hematological: Bruises/bleeds easily.  All other systems reviewed and are negative.   Allergies  Review of patient's allergies indicates no known allergies.  Home Medications   Prior to Admission medications   Medication Sig Start Date End Date Taking? Authorizing Provider  ciprofloxacin (CIPRO) 500 MG tablet Take 500 mg by mouth 2 (two) times daily. 04/24/15  Yes Historical Provider, MD  furosemide (LASIX) 20 MG tablet Take 2 tablets (40 mg total) by mouth 2 (two) times daily. Patient taking differently: Take 60 mg by mouth daily.  03/23/15  Yes Lesle ChrisKaren M Black, NP  lactulose (CHRONULAC) 10 GM/15ML solution Take 30 mLs by mouth daily as needed for mild constipation.  03/31/15 04/30/15 Yes Historical Provider, MD  levETIRAcetam (KEPPRA) 500 MG tablet Take 1 tablet (500 mg total) by mouth 2 (two) times daily. 02/20/15  Yes  Suanne MarkerVikram R Penumalli, MD  levothyroxine (SYNTHROID, LEVOTHROID) 75 MCG tablet Take 75 mcg by mouth daily before breakfast.   Yes Historical Provider, MD  LORazepam (ATIVAN) 0.5 MG tablet Take 1 tablet (0.5 mg total) by mouth as needed for anxiety. 02/24/15  Yes Len Blalockerri L [name], NP  Multiple Vitamin (MULTIVITAMIN WITH MINERALS) TABS tablet Take 1 tablet by mouth daily.   Yes Historical Provider, MD  nystatin ointment (MYCOSTATIN) Apply 1 application topically daily as needed (rash).  04/22/15  Yes Historical Provider, MD  Powders (ANTI MONKEY BUTT) POWD Apply 1 application topically daily as needed (for moisture).   Yes Historical Provider, MD  spironolactone (ALDACTONE)  100 MG tablet Take 1 tablet (100 mg total) by mouth 2 (two) times daily. Patient taking differently: Take 100 mg by mouth daily.  03/23/15  Yes Lesle ChrisKaren M Black, NP  traMADol (ULTRAM) 50 MG tablet Take 50 mg by mouth every 6 (six) hours as needed. pain 04/08/15  Yes Historical Provider, MD  triamcinolone cream (KENALOG) 0.1 % Apply 1 application topically 2 (two) times daily as needed. itching 03/30/15  Yes Historical Provider, MD  ursodiol (ACTIGALL) 300 MG capsule Take 1 capsule (300 mg total) by mouth 3 (three) times daily. 04/03/15  Yes Len Blalockerri L [name], NP  ursodiol (ACTIGALL) 300 MG capsule Take 1 capsule (300 mg total) by mouth 3 (three) times daily. Patient not taking: Reported on 04/28/2015 04/03/15   Len Blalockerri L [name], NP   BP 134/47 mmHg  Pulse 88  Temp(Src) 98.2 F (36.8 C) (Oral)  Resp 20  Ht 5\' 2"  (1.575 m)  Wt 220 lb (99.791 kg)  BMI 40.23 kg/m2  SpO2 100% Physical Exam  Constitutional: She appears well-developed and well-nourished. No distress.  HENT:  Head: Normocephalic and atraumatic.  Mouth/Throat: Oropharynx is clear and moist. No oropharyngeal exudate.  Eyes: Conjunctivae and EOM are normal. Pupils are equal, round, and reactive to light. Right eye exhibits no discharge. Left eye exhibits no discharge. Scleral icterus is present.  Neck: Normal range of motion. Neck supple. No JVD present. No thyromegaly present.  Cardiovascular: Normal rate, regular rhythm and intact distal pulses.  Exam reveals no gallop and no friction rub.   Murmur heard. Systolic murmur.   Pulmonary/Chest: Effort normal and breath sounds normal. No respiratory distress. She has no wheezes. She has no rales.  Abdominal: Soft. Bowel sounds are normal. She exhibits no distension and no mass. There is no tenderness.  Musculoskeletal: Normal range of motion. She exhibits edema. She exhibits no tenderness.  +3 symmetrical pitting edema of the legs.   Lymphadenopathy:    She has no cervical adenopathy.   Neurological: She is alert. Coordination normal.  Skin: Skin is warm and dry. No rash noted. She is not diaphoretic. No erythema.  Psychiatric: She has a normal mood and affect. Her behavior is normal.  Nursing note and vitals reviewed.   ED Course  Procedures (including critical care time) DIAGNOSTIC STUDIES: Oxygen Saturation is 100% on RA, normal by my interpretation.    COORDINATION OF CARE: 8:59 PM- Pt advised of plan for treatment which includes medication and labs and pt agrees.  Labs Review Labs Reviewed  CBC WITH DIFFERENTIAL/PLATELET - Abnormal; Notable for the following:    RBC 3.17 (*)    Hemoglobin 10.8 (*)    HCT 31.8 (*)    MCV 100.3 (*)    MCH 34.1 (*)    RDW 20.3 (*)    Platelets 111 (*)    All  other components within normal limits  COMPREHENSIVE METABOLIC PANEL - Abnormal; Notable for the following:    Sodium 133 (*)    Chloride 100 (*)    Calcium 8.3 (*)    Total Protein 5.9 (*)    Albumin 2.4 (*)    AST 84 (*)    Alkaline Phosphatase 129 (*)    Total Bilirubin 8.4 (*)    All other components within normal limits  PROTIME-INR    Imaging Review No results found.   EKG Interpretation   Date/Time:  Tuesday Apr 28 2015 21:28:22 EDT Ventricular Rate:  80 PR Interval:  158 QRS Duration: 89 QT Interval:  386 QTC Calculation: 445 R Axis:   72 Text Interpretation:  Sinus rhythm Normal ECG since last tracing no  significant change Confirmed by Jacquelina Hewins  MD, Sammie Denner ([id number]) on 04/28/2015  9:33:02 PM      MDM   Final diagnoses:  Peripheral edema  Liver failure    The patient was seen and examined, EKG normal, laboratory workup rather unremarkable except for the patient's known liver failure, her vital signs remained in a normal range, she will need admission to the hospital for diuresis as she is significantly fluid overloaded.  D/w Dr. Onalee Hua who is in agreement. I personally performed the services described in this documentation, which was  scribed in my presence. The recorded information has been reviewed and is accurate.       Cynthia Hong, MD 04/28/15 2252

## 2015-04-28 NOTE — H&P (Signed)
PCP:   Catalina PizzaHALL, ZACH, MD   Chief Complaint:  swelling  HPI: 49 yo female h/o cirrhosis of the liver [facility] comes in with approximately 30lbs weight gain in last week.  Taking 60mg  lasix po daily, no fevers.  No abd pain.  Sob especially with lying flat.  [facility] goes over her case tomorrow concerning whether she gets on the list or not.  She takes her medications daily.  She denies any pain.  No cough.  Review of Systems:  Positive and negative as per HPI otherwise all other systems are negative  Past Medical History: Past Medical History  Diagnosis Date  . Thyroid disease   . Hypothyroidism   . Seizures   . Cirrhosis   . Thrombocytopenia    Past Surgical History  Procedure Laterality Date  . Abdominal hysterectomy    . Abdominal hysterectomy    . Liver biopsy      Medications: Prior to Admission medications   Medication Sig Start Date End Date Taking? Authorizing Provider  ciprofloxacin (CIPRO) 500 MG tablet Take 500 mg by mouth 2 (two) times daily. 04/24/15  Yes Historical Provider, MD  furosemide (LASIX) 20 MG tablet Take 2 tablets (40 mg total) by mouth 2 (two) times daily. Patient taking differently: Take 60 mg by mouth daily.  03/23/15  Yes Lesle ChrisKaren M Black, NP  lactulose (CHRONULAC) 10 GM/15ML solution Take 30 mLs by mouth daily as needed for mild constipation.  03/31/15 04/30/15 Yes Historical Provider, MD  levETIRAcetam (KEPPRA) 500 MG tablet Take 1 tablet (500 mg total) by mouth 2 (two) times daily. 02/20/15  Yes Suanne MarkerVikram R Penumalli, MD  levothyroxine (SYNTHROID, LEVOTHROID) 75 MCG tablet Take 75 mcg by mouth daily before breakfast.   Yes Historical Provider, MD  LORazepam (ATIVAN) 0.5 MG tablet Take 1 tablet (0.5 mg total) by mouth as needed for anxiety. 02/24/15  Yes Len Blalockerri L [name], NP  Multiple Vitamin (MULTIVITAMIN WITH MINERALS) TABS tablet Take 1 tablet by mouth daily.   Yes Historical Provider, MD  nystatin ointment (MYCOSTATIN) Apply 1  application topically daily as needed (rash).  04/22/15  Yes Historical Provider, MD  Powders (ANTI MONKEY BUTT) POWD Apply 1 application topically daily as needed (for moisture).   Yes Historical Provider, MD  spironolactone (ALDACTONE) 100 MG tablet Take 1 tablet (100 mg total) by mouth 2 (two) times daily. Patient taking differently: Take 100 mg by mouth daily.  03/23/15  Yes Lesle ChrisKaren M Black, NP  traMADol (ULTRAM) 50 MG tablet Take 50 mg by mouth every 6 (six) hours as needed. pain 04/08/15  Yes Historical Provider, MD  triamcinolone cream (KENALOG) 0.1 % Apply 1 application topically 2 (two) times daily as needed. itching 03/30/15  Yes Historical Provider, MD  ursodiol (ACTIGALL) 300 MG capsule Take 1 capsule (300 mg total) by mouth 3 (three) times daily. 04/03/15  Yes Len Blalockerri L [name], NP  ursodiol (ACTIGALL) 300 MG capsule Take 1 capsule (300 mg total) by mouth 3 (three) times daily. Patient not taking: Reported on 04/28/2015 04/03/15   Len Blalockerri L [name], NP    Allergies:  No Known Allergies  Social History:  reports that she has never smoked. She has never used smokeless tobacco. She reports that she does not drink alcohol or use illicit drugs.  Family History: Family History  Problem Relation Age of Onset  . Breast cancer Mother   . Ovarian cancer Mother   . Thyroid cancer Mother   . Diabetes Father   .  Heart disease Father   . Skin cancer Father   . Celiac disease Paternal Aunt     Physical Exam: Filed Vitals:   04/28/15 1829 04/28/15 2056 04/28/15 2130  BP: 134/47 125/59 103/48  Pulse: 88 92 83  Temp: 98.2 F (36.8 C) 98 F (36.7 C)   TempSrc: Oral Oral   Resp: 20 20 22   Height: 5\' 2"  (1.575 m)    Weight: 99.791 kg (220 lb)    SpO2: 100% 100% 98%   General appearance: alert, cooperative and no distress Head: Normocephalic, without obvious abnormality, atraumatic Eyes: positive findings: sclera icteric Nose: Nares normal. Septum midline. Mucosa normal. No drainage or sinus  tenderness. Neck: no JVD and supple, symmetrical, trachea midline Lungs: clear to auscultation bilaterally Heart: regular rate and rhythm, S1, S2 normal, no murmur, click, rub or gallop Abdomen: soft, non-tender; bowel sounds normal; no masses,  no organomegaly Extremities: edema 3+ Pulses: 2+ and symmetric Skin: jaundice noted Neurologic: Grossly normal    Labs on Admission:   Recent Labs  04/28/15 2140  NA 133*  K 4.3  CL 100*  CO2 27  GLUCOSE 96  BUN 14  CREATININE 0.63  CALCIUM 8.3*    Recent Labs  04/28/15 2140  AST 84*  ALT 44  ALKPHOS 129*  BILITOT 8.4*  PROT 5.9*  ALBUMIN 2.4*    Recent Labs  04/28/15 2140  WBC 8.3  NEUTROABS 5.0  HGB 10.8*  HCT 31.8*  MCV 100.3*  PLT 111*    Radiological Exams on Admission: No results found.  12 lead ekg reviewed by myself Old records reviewed by myself  Assessment/Plan  49 yo female with decompensated liver cirrhosis  Principal Problem:   Decompensation of cirrhosis of liver-  Place on lasix 40mg  iv q 12hr.  Check coags.  Will also consult gi in am, as she may need albumin again which seemed to help significantly her last hospitalization.  Vitals stable, monitor for any deterioration in mental status and/or respiratory status.  All appear normal at this time.  Undergoing [facility] evaluation already as in HPI.   Active Problems:   Seizure disorder  stable   Cirrhosis of liver with ascites   Dyspnea   Thrombocytopenia  Seen before midnight.  Admit to medical bed.  FULL CODE.    Gid Schoffstall A 04/28/2015, 10:36 PM

## 2015-04-28 NOTE — ED Notes (Signed)
Report given to Chama RN 

## 2015-04-28 NOTE — ED Notes (Signed)
Pt c/o leg swelling x 2 weeks; pt is taking Lasix and spironalactone;

## 2015-04-29 DIAGNOSIS — R0600 Dyspnea, unspecified: Secondary | ICD-10-CM

## 2015-04-29 DIAGNOSIS — K7460 Unspecified cirrhosis of liver: Principal | ICD-10-CM

## 2015-04-29 DIAGNOSIS — R609 Edema, unspecified: Secondary | ICD-10-CM

## 2015-04-29 DIAGNOSIS — D696 Thrombocytopenia, unspecified: Secondary | ICD-10-CM

## 2015-04-29 LAB — CBC
HEMATOCRIT: 28.6 % — AB (ref 36.0–46.0)
Hemoglobin: 9.8 g/dL — ABNORMAL LOW (ref 12.0–15.0)
MCH: 34.3 pg — AB (ref 26.0–34.0)
MCHC: 34.3 g/dL (ref 30.0–36.0)
MCV: 100 fL (ref 78.0–100.0)
Platelets: 94 10*3/uL — ABNORMAL LOW (ref 150–400)
RBC: 2.86 MIL/uL — ABNORMAL LOW (ref 3.87–5.11)
RDW: 20.2 % — AB (ref 11.5–15.5)
WBC: 5.9 10*3/uL (ref 4.0–10.5)

## 2015-04-29 LAB — BASIC METABOLIC PANEL
ANION GAP: 5 (ref 5–15)
BUN: 15 mg/dL (ref 6–20)
CHLORIDE: 102 mmol/L (ref 101–111)
CO2: 27 mmol/L (ref 22–32)
Calcium: 8 mg/dL — ABNORMAL LOW (ref 8.9–10.3)
Creatinine, Ser: 0.59 mg/dL (ref 0.44–1.00)
GFR calc non Af Amer: >60 mL/min (ref >60)
Glucose, Bld: 78 mg/dL (ref 65–99)
POTASSIUM: 4 mmol/L (ref 3.5–5.1)
Sodium: 134 mmol/L — ABNORMAL LOW (ref 135–145)

## 2015-04-29 MED ORDER — FUROSEMIDE 10 MG/ML IJ SOLN
20.0000 mg | Freq: Once | INTRAMUSCULAR | Status: AC
  Administered 2015-04-29: 20 mg via INTRAVENOUS

## 2015-04-29 MED ORDER — LACTULOSE 10 GM/15ML PO SOLN
20.0000 g | Freq: Three times a day (TID) | ORAL | Status: DC
  Administered 2015-04-29 – 2015-05-03 (×8): 20 g via ORAL
  Filled 2015-04-29 (×12): qty 30

## 2015-04-29 MED ORDER — TUBERCULIN PPD 5 UNIT/0.1ML ID SOLN
5.0000 [IU] | Freq: Once | INTRADERMAL | Status: AC
Start: 2015-04-29 — End: 2015-05-01
  Administered 2015-04-29: 5 [IU] via INTRADERMAL
  Filled 2015-04-29: qty 0.1

## 2015-04-29 MED ORDER — FUROSEMIDE 10 MG/ML IJ SOLN
40.0000 mg | Freq: Once | INTRAMUSCULAR | Status: AC
  Administered 2015-04-29: 40 mg via INTRAVENOUS
  Filled 2015-04-29: qty 4

## 2015-04-29 MED ORDER — FUROSEMIDE 10 MG/ML IJ SOLN
60.0000 mg | Freq: Two times a day (BID) | INTRAMUSCULAR | Status: DC
  Administered 2015-04-29 – 2015-05-04 (×10): 60 mg via INTRAVENOUS
  Filled 2015-04-29 (×10): qty 6

## 2015-04-29 MED ORDER — ALBUMIN HUMAN 25 % IV SOLN
50.0000 g | Freq: Every day | INTRAVENOUS | Status: AC
  Administered 2015-04-29 – 2015-05-01 (×3): 50 g via INTRAVENOUS
  Filled 2015-04-29: qty 100
  Filled 2015-04-29 (×2): qty 200

## 2015-04-29 NOTE — Progress Notes (Signed)
[facility] PROGRESS NOTE  Cynthia CravenLinda Stephenson ZOX:[id number] DOB: May 10, 1966 DOA: 04/28/2015 PCP: Catalina PizzaHALL, ZACH, MD  Assessment/Plan: Decompensation of cirrhosis of liver- chart review indicated she underwent liver biopsy 02/2015 which revealed cholestatic liver injury with features of cirrhosis.  Placed on lasix 40mg  iv q 12hr but has not received dose yet. Currently getting albumin and lasix to be administered after albumin per GI. await coags. Await GI input. Reports some improvement with swelling of abdomen and LE.  Vitals remain stable. Oxygen saturation level 95% on room air.  Undergoing [facility] evaluation.  Active Problems:  Dyspnea: related to #1. Much improved. Received 60mg  lasix last evening in ED. Scheduled to receive lasix 40mg  q12 hours once albumin complete. Complains abdominal "fullness". No wave. No respiratory distress. Monitor   Thrombocytopenia: trending downward and worsening per chart review. Related to #1. No s/sx bleeding.    Seizure disorder stable   Cirrhosis of liver with ascites: [facility]   Code Status: full Family Communication: parents at bedside Disposition Plan: home when ready  Consultants:  GI  Procedures:  none  Antibiotics:  none  HPI/Subjective: Sitting up in bed watching tv. Denies pain but complains of "fullness".  Objective: Filed Vitals:   04/29/15 1238  BP: 127/44  Pulse: 87  Temp: 97.8 F (36.6 C)  Resp: 20    Intake/Output Summary (Last 24 hours) at 04/29/15 1311 Last data filed at 04/29/15 1238  Gross per 24 hour  Intake    723 ml  Output    650 ml  Net     73 ml   Filed Weights   04/28/15 1829 04/28/15 2300 04/29/15 0700  Weight: 99.791 kg (220 lb) 115.168 kg (253 lb 14.4 oz) 114.261 kg (251 lb 14.4 oz)    Exam:   General:  Obese appears comfortable  Cardiovascular: RRR no MGR 2+ LE pitting edema up to hips  Respiratory: normal effort BS clear but distant no wheeze  Abdomen: obese  distended but soft no wave  Musculoskeletal: no clubbing or cyanosis  Data Reviewed: Basic Metabolic Panel:  Recent Labs Lab 04/28/15 2140 04/29/15 0613  NA 133* 134*  K 4.3 4.0  CL 100* 102  CO2 27 27  GLUCOSE 96 78  BUN 14 15  CREATININE 0.63 0.59  CALCIUM 8.3* 8.0*   Liver Function Tests:  Recent Labs Lab 04/28/15 2140  AST 84*  ALT 44  ALKPHOS 129*  BILITOT 8.4*  PROT 5.9*  ALBUMIN 2.4*   No results for input(s): LIPASE, AMYLASE in the last 168 hours. No results for input(s): AMMONIA in the last 168 hours. CBC:  Recent Labs Lab 04/28/15 2140 04/29/15 0613  WBC 8.3 5.9  NEUTROABS 5.0  --   HGB 10.8* 9.8*  HCT 31.8* 28.6*  MCV 100.3* 100.0  PLT 111* 94*   Cardiac Enzymes: No results for input(s): CKTOTAL, CKMB, CKMBINDEX, TROPONINI in the last 168 hours. BNP (last 3 results)  Recent Labs  03/19/15 1340  BNP 158.0*    ProBNP (last 3 results) No results for input(s): PROBNP in the last 8760 hours.  CBG: No results for input(s): GLUCAP in the last 168 hours.  No results found for this or any previous visit (from the past 240 hour(s)).   Studies: No results found.  Scheduled Meds: . albumin human  50 g Intravenous Daily  . furosemide  40 mg Intravenous Q12H  . lactulose  20 g Oral TID  . levETIRAcetam  500 mg Oral BID  .  levothyroxine  75 mcg Oral QAC breakfast  . sodium chloride  3 mL Intravenous Q12H  . spironolactone  100 mg Oral BID   Continuous Infusions:   Principal Problem:   Decompensation of cirrhosis of liver Active Problems:   Seizure disorder   Cirrhosis of liver with ascites   Dyspnea   Thrombocytopenia   Cirrhosis    Time spent: 30 minutes    [facility] GreenBLACK,Brinley Treanor M  [facility] Pager [phone]. If 7PM-7AM, please contact night-coverage at [email or web address], password [email or web address] 04/29/2015, 1:11 PM  LOS: 1 day

## 2015-04-29 NOTE — Consult Note (Signed)
0Reason for Consult: Decompensated liver disease Referring Physician: Hospitalist services  Cynthia Stephenson is an 49 y.o. female.  HPI: Admitted thru the ED last night due to swelling in her lower extremities. She says she also was SOB.  She does have some abdominal distention.  She tells me her Lasix was [facility] from 165m to 624ma week a [facility] started about a week ago.  Hx of decompensated liver disease. [facility]. She tells me Transplant will meet today. Meld score two week ago was 25. [facility]. She says these are basically the same symptoms she has been having. She is voiding okay. BMs are normal. She denies any confusion. Discharge weight in April was 240. Patient states it actually was 219.   . Marland KitcheniMarijahnderwent a liver biopsy in March of 2016 which revealed cholestatic liver injury with features of cirrhosis. Trichrome stain demonstrates nodular cirrhosis with broad zones of parenchymal dropout. Iron stain demonstrates 2+hemosiderin deposition. PAS positive intrahepatic deposits present.   Markers for Auto Immune liver disease were all normal.   I/O last 3 completed shifts: In: 240 [P.O.:240] Out: 6598Urine:650]       Past Medical History  Diagnosis Date  . Thyroid disease   . Hypothyroidism   . Seizures   . Cirrhosis   . Thrombocytopenia     Past Surgical History  Procedure Laterality Date  . Abdominal hysterectomy    . Abdominal hysterectomy    . Liver biopsy      Family History  Problem Relation Age of Onset  . Breast cancer Mother   . Ovarian cancer Mother   . Thyroid cancer Mother   . Diabetes Father   . Heart disease Father   . Skin cancer Father   . Celiac disease Paternal Aunt     Social History:  reports that she has never smoked. She has never used smokeless tobacco. She reports that she does not  drink alcohol or use illicit drugs.  Allergies: No Known Allergies  Medications: I have reviewed the patient's current medications.  Results for orders placed or performed during the hospital encounter of 04/28/15 (from the past 48 hour(s))  CBC with Differential/Platelet     Status: Abnormal   Collection Time: 04/28/15  9:40 PM  Result Value Ref Range   WBC 8.3 4.0 - 10.5 K/uL   RBC 3.17 (L) 3.87 - 5.11 MIL/uL   Hemoglobin 10.8 (L) 12.0 - 15.0 g/dL   HCT 31.8 (L) 36.0 - 46.0 %   MCV 100.3 (H) 78.0 - 100.0 fL   MCH 34.1 (H) 26.0 - 34.0 pg   MCHC 34.0 30.0 - 36.0 g/dL   RDW 20.3 (H) 11.5 - 15.5 %   Platelets 111 (L) 150 - 400 K/uL    Comment: RESULT REPEATED AND VERIFIED SPECIMEN CHECKED FOR CLOTS    Neutrophils Relative % 61 43 - 77 %   Neutro Abs 5.0 1.7 - 7.7 K/uL   Lymphocytes Relative 24 12 - 46 %   Lymphs Abs 2.0 0.7 - 4.0 K/uL   Monocytes Relative 11 3 - 12 %   Monocytes Absolute 0.9 0.1 - 1.0 K/uL   Eosinophils Relative 3 0 - 5 %   Eosinophils Absolute 0.3 0.0 - 0.7 K/uL   Basophils Relative 1 0 - 1 %   Basophils Absolute 0.1 0.0 -  0.1 K/uL   WBC Morphology TOXIC GRANULATION    RBC Morphology TEARDROP CELLS    Smear Review PLATELET COUNT CONFIRMED BY SMEAR   Comprehensive metabolic panel     Status: Abnormal   Collection Time: 04/28/15  9:40 PM  Result Value Ref Range   Sodium 133 (L) 135 - 145 mmol/L   Potassium 4.3 3.5 - 5.1 mmol/L   Chloride 100 (L) 101 - 111 mmol/L   CO2 27 22 - 32 mmol/L   Glucose, Bld 96 65 - 99 mg/dL   BUN 14 6 - 20 mg/dL   Creatinine, Ser 0.63 0.44 - 1.00 mg/dL   Calcium 8.3 (L) 8.9 - 10.3 mg/dL   Total Protein 5.9 (L) 6.5 - 8.1 g/dL   Albumin 2.4 (L) 3.5 - 5.0 g/dL   AST 84 (H) 15 - 41 U/L   ALT 44 14 - 54 U/L   Alkaline Phosphatase 129 (H) 38 - 126 U/L   Total Bilirubin 8.4 (H) 0.3 - 1.2 mg/dL   GFR calc non Af Amer >60 >60 mL/min   GFR calc Af Amer >60 >60 mL/min    Comment: (NOTE) The eGFR has been calculated using the CKD EPI  equation. This calculation has not been validated in all clinical situations. eGFR's persistently <60 mL/min signify possible Chronic Kidney Disease.    Anion gap 6 5 - 15  Protime-INR     Status: Abnormal   Collection Time: 04/28/15 11:01 PM  Result Value Ref Range   Prothrombin Time 18.7 (H) 11.6 - 15.2 seconds   INR 1.55 (H) 0.00 - 5.45  Basic metabolic panel     Status: Abnormal   Collection Time: 04/29/15  6:13 AM  Result Value Ref Range   Sodium 134 (L) 135 - 145 mmol/L   Potassium 4.0 3.5 - 5.1 mmol/L   Chloride 102 101 - 111 mmol/L   CO2 27 22 - 32 mmol/L   Glucose, Bld 78 65 - 99 mg/dL   BUN 15 6 - 20 mg/dL   Creatinine, Ser 0.59 0.44 - 1.00 mg/dL   Calcium 8.0 (L) 8.9 - 10.3 mg/dL   GFR calc non Af Amer >60 >60 mL/min   GFR calc Af Amer >60 >60 mL/min    Comment: (NOTE) The eGFR has been calculated using the CKD EPI equation. This calculation has not been validated in all clinical situations. eGFR's persistently <60 mL/min signify possible Chronic Kidney Disease.    Anion gap 5 5 - 15    No results found.  ROS Blood pressure 114/60, pulse 92, temperature 98.2 F (36.8 C), temperature source Oral, resp. rate 22, height _0  (1.575 m), weight 251 lb 14.4 oz (114.261 kg), SpO2 96 %. Physical Exam  Assessment/Plan:Alert and oriented. Skin warm and dry. Oral mucosa is moist.   . Sclera anicteric, conjunctivae is pink. Thyroid not enlarged. No cervical lymphadenopathy. Lungs clear. Heart regular rate and rhythm. Loud murmur heard.  Abdomen is soft. Bowel sounds are positive. No hepatomegaly. No abdominal masses felt. No tenderness. 2-3+ edema to lower extremities. Some edema to arm.    Decompensated liver disease. Admitted with fluid overload.  Her breathing is better.   Albumin 50gm IV daily x 3 followed by Lasix 27m IV. I discussed this case with Dr. RLaural Golden   Cynthia Stephenson 04/29/2015, 7:57 AM     GI attending note; Patient is well known to me. [facility] She now comes  in with fluid overload. During her last admission for more or less similar symptoms she responded very well to IV albumin and furosemide. Renal function is well-preserved and no contraindication to using spironolactone. Will place PPD as requested by her transplant team at [facility]. Will plan esophagogastroduodenoscopy in order to treat esophageal varices for primary prophylaxis before discharge. Will monitor electrolytes and renal function closely while aggressive diuretic therapy undertaken.

## 2015-04-29 NOTE — Progress Notes (Signed)
UR chart review completed.  

## 2015-04-29 NOTE — Progress Notes (Signed)
Patient received TB skin test per MD order @ 1836. Skin test was placed on the right forearm and was circled to identify location. Patient tolerated insertion well. Patients results will need to be read on Friday Apr 29, 2015 after 1836.

## 2015-04-30 DIAGNOSIS — K7460 Unspecified cirrhosis of liver: Secondary | ICD-10-CM

## 2015-04-30 DIAGNOSIS — R609 Edema, unspecified: Secondary | ICD-10-CM

## 2015-04-30 DIAGNOSIS — K7290 Hepatic failure, unspecified without coma: Secondary | ICD-10-CM

## 2015-04-30 LAB — HEPATIC FUNCTION PANEL
ALK PHOS: 118 U/L (ref 38–126)
ALT: 37 U/L (ref 14–54)
AST: 78 U/L — AB (ref 15–41)
Albumin: 2.9 g/dL — ABNORMAL LOW (ref 3.5–5.0)
BILIRUBIN TOTAL: 7.7 mg/dL — AB (ref 0.3–1.2)
Bilirubin, Direct: 4.2 mg/dL — ABNORMAL HIGH (ref 0.1–0.5)
Indirect Bilirubin: 3.5 mg/dL — ABNORMAL HIGH (ref 0.3–0.9)
Total Protein: 6 g/dL — ABNORMAL LOW (ref 6.5–8.1)

## 2015-04-30 LAB — PROTIME-INR
INR: 1.64 — ABNORMAL HIGH (ref 0.00–1.49)
Prothrombin Time: 19.5 seconds — ABNORMAL HIGH (ref 11.6–15.2)

## 2015-04-30 LAB — BASIC METABOLIC PANEL
Anion gap: 10 (ref 5–15)
BUN: 15 mg/dL (ref 6–20)
CO2: 26 mmol/L (ref 22–32)
Calcium: 8.4 mg/dL — ABNORMAL LOW (ref 8.9–10.3)
Chloride: 101 mmol/L (ref 101–111)
Creatinine, Ser: 0.64 mg/dL (ref 0.44–1.00)
GFR calc non Af Amer: >60 mL/min (ref >60)
Glucose, Bld: 79 mg/dL (ref 65–99)
POTASSIUM: 4 mmol/L (ref 3.5–5.1)
SODIUM: 137 mmol/L (ref 135–145)

## 2015-04-30 LAB — CBC
HCT: 30.5 % — ABNORMAL LOW (ref 36.0–46.0)
Hemoglobin: 10.3 g/dL — ABNORMAL LOW (ref 12.0–15.0)
MCH: 34.3 pg — ABNORMAL HIGH (ref 26.0–34.0)
MCHC: 33.8 g/dL (ref 30.0–36.0)
MCV: 101.7 fL — AB (ref 78.0–100.0)
Platelets: 114 10*3/uL — ABNORMAL LOW (ref 150–400)
RBC: 3 MIL/uL — AB (ref 3.87–5.11)
RDW: 20.4 % — AB (ref 11.5–15.5)
WBC: 6.7 10*3/uL (ref 4.0–10.5)

## 2015-04-30 MED ORDER — SODIUM CHLORIDE 0.9 % IV SOLN
INTRAVENOUS | Status: DC
  Administered 2015-05-01: 1000 mL via INTRAVENOUS

## 2015-04-30 NOTE — Progress Notes (Signed)
Patient ID: Cynthia CravenLinda Stephenson, female   DOB: 1966/11/14, 49 y.o.   MRN: [id number]  She says she feels better. Less edema to lower legs but still has edema. Weight down  To 248 from 251.  Has received Albumin x 1 with Lasix and next albumin is due.  Blood pressure 114/51, pulse 81, temperature 98.2 F (36.8 C), temperature source Oral, resp. rate 19, height 5\' 2"  (1.575 m), weight 248 lb 1.6 oz (112.537 kg), SpO2 97 %. Will continue to monitor her fluid overload.

## 2015-04-30 NOTE — Care Management Note (Signed)
Case Management Note  Patient Details  Name: Olene CravenLinda Flemister MRN: [id number] Date of Birth: 1966/11/20  Subjective/Objective:                  Pt admitted from home with liver failure. Pt lives with her husband and will return home at discharge. Pt is fairly independent with ADL's. Action/Plan: Pt did state she would [facility] Order faxed and they will deliver to pts home at discharge. No other Cm needs noted.  Expected Discharge Date:                  Expected Discharge Plan:  Home/Self Care  In-House Referral:  NA  Discharge planning Services  CM Consult  Post Acute Care Choice:  Durable Medical Equipment Choice offered to:  Patient  DME Arranged:  3-N-1 DME Agency:  [facility]  HH Arranged:    HH Agency:     Status of Service:  Completed, signed off  Medicare Important Message Given:    Date Medicare IM Given:    Medicare IM give by:    Date Additional Medicare IM Given:    Additional Medicare Important Message give by:     If discussed at Long Length of Stay Meetings, dates discussed:    Additional Comments:  Cheryl FlashBlackwell, Quintasha Gren Crowder, RN 04/30/2015, 2:23 PM

## 2015-04-30 NOTE — Progress Notes (Signed)
Patient is very pleased that she is diuresing quite a bit. She says legs don't feel as heavy. She denies shortness of breath or abdominal pain. Lab studies from this morning reviewed. Copy has been faxed to miss Bennie HindMichelle Curl, [facility](my office sent the fax). Patient needs EGD with banding if indicated. Will schedule patient for EGD in a.m.

## 2015-04-30 NOTE — Progress Notes (Signed)
[facility] PROGRESS NOTE  Cynthia CravenLinda Stephenson [id number] DOB: 1966/06/07 DOA: 04/28/2015 PCP: Catalina PizzaHALL, ZACH, MD  Assessment/Plan: Decompensation of cirrhosis of liver- volume statis -3.8L. Weight down 112.5kg down from 114.2kg on admission. Total bilirubin trending down. INR trending up. Continue IV lasix and albumin. Appreciate GI assistance. await coags.  Reports some improvement with swelling of abdomen and LE. Vitals remain stable. Oxygen saturation level 97% on room air.  Undergoing [facility] evaluation.  Active Problems:  Dyspnea: resolved today.  related to #1. Less abdominal "fullness". No wave. No respiratory distress. Monitor   Thrombocytopenia: trending downward and worsening per chart review. Related to #1. No s/sx bleeding.    Seizure disorder stable   Cirrhosis of liver with ascites: [facility] Per GI EGD planned for tomorrow to treat esophageal varices for primary prophylaxis before discharge. Continue PPI   Code Status: full Family Communication: none present Disposition Plan: home when clinically ready   Consultants:  GI  Procedures:  none  Antibiotics:  none  HPI/Subjective: Sitting up in bed watching TV and smiling. Reports feeling much better and "glad to be where i need to be"  Objective: Filed Vitals:   04/30/15 0950  BP: 114/51  Pulse: 81  Temp: 98.2 F (36.8 C)  Resp: 19    Intake/Output Summary (Last 24 hours) at 04/30/15 1406 Last data filed at 04/30/15 1307  Gross per 24 hour  Intake    923 ml  Output   4250 ml  Net  -3327 ml   Filed Weights   04/28/15 2300 04/29/15 0700 04/30/15 0532  Weight: 115.168 kg (253 lb 14.4 oz) 114.261 kg (251 lb 14.4 oz) 112.537 kg (248 lb 1.6 oz)    Exam:   General:  Obese jaundice appears comfortable  Cardiovascular: RRR LE with 2+ edema  Respiratory: normal effort BS clear bilaterally no crackle  Abdomen: obese soft +BS   Musculoskeletal: joints without  swelling/cyanosis   Data Reviewed: Basic Metabolic Panel:  Recent Labs Lab 04/28/15 2140 04/29/15 0613 04/30/15 0611  NA 133* 134* 137  K 4.3 4.0 4.0  CL 100* 102 101  CO2 27 27 26   GLUCOSE 96 78 79  BUN 14 15 15   CREATININE 0.63 0.59 0.64  CALCIUM 8.3* 8.0* 8.4*   Liver Function Tests:  Recent Labs Lab 04/28/15 2140 04/30/15 0611  AST 84* 78*  ALT 44 37  ALKPHOS 129* 118  BILITOT 8.4* 7.7*  PROT 5.9* 6.0*  ALBUMIN 2.4* 2.9*   No results for input(s): LIPASE, AMYLASE in the last 168 hours. No results for input(s): AMMONIA in the last 168 hours. CBC:  Recent Labs Lab 04/28/15 2140 04/29/15 0613 04/30/15 0611  WBC 8.3 5.9 6.7  NEUTROABS 5.0  --   --   HGB 10.8* 9.8* 10.3*  HCT 31.8* 28.6* 30.5*  MCV 100.3* 100.0 101.7*  PLT 111* 94* 114*   Cardiac Enzymes: No results for input(s): CKTOTAL, CKMB, CKMBINDEX, TROPONINI in the last 168 hours. BNP (last 3 results)  Recent Labs  03/19/15 1340  BNP 158.0*    ProBNP (last 3 results) No results for input(s): PROBNP in the last 8760 hours.  CBG: No results for input(s): GLUCAP in the last 168 hours.  No results found for this or any previous visit (from the past 240 hour(s)).   Studies: No results found.  Scheduled Meds: . albumin human  50 g Intravenous Daily  . furosemide  60 mg Intravenous Q12H  . lactulose  20 g Oral  TID  . levETIRAcetam  500 mg Oral BID  . levothyroxine  75 mcg Oral QAC breakfast  . sodium chloride  3 mL Intravenous Q12H  . spironolactone  100 mg Oral BID  . tuberculin  5 Units Intradermal Once   Continuous Infusions:   Principal Problem:   Decompensation of cirrhosis of liver Active Problems:   Seizure disorder   Cirrhosis of liver with ascites   Dyspnea   Thrombocytopenia   Cirrhosis   Peripheral edema    Time spent: 30 minutes    [facility] CenterBLACK,[name]  [facility] Pager [phone]. If 7PM-7AM, please contact night-coverage at [email or web address], password  [email or web address] 04/30/2015, 2:06 PM  LOS: 2 days

## 2015-05-01 ENCOUNTER — Encounter (HOSPITAL_COMMUNITY)
Admission: EM | Disposition: A | Discharge status: Home / Self Care | Payer: Self-pay | Source: Home / Self Care | Attending: Internal Medicine

## 2015-05-01 DIAGNOSIS — K766 Portal hypertension: Secondary | ICD-10-CM

## 2015-05-01 DIAGNOSIS — I8500 Esophageal varices without bleeding: Secondary | ICD-10-CM

## 2015-05-01 DIAGNOSIS — K259 Gastric ulcer, unspecified as acute or chronic, without hemorrhage or perforation: Secondary | ICD-10-CM

## 2015-05-01 DIAGNOSIS — K449 Diaphragmatic hernia without obstruction or gangrene: Secondary | ICD-10-CM

## 2015-05-01 HISTORY — PX: ESOPHAGOGASTRODUODENOSCOPY: SHX5428

## 2015-05-01 LAB — BASIC METABOLIC PANEL
ANION GAP: 7 (ref 5–15)
BUN: 15 mg/dL (ref 6–20)
CHLORIDE: 104 mmol/L (ref 101–111)
CO2: 27 mmol/L (ref 22–32)
CREATININE: 0.65 mg/dL (ref 0.44–1.00)
Calcium: 8.6 mg/dL — ABNORMAL LOW (ref 8.9–10.3)
GFR calc Af Amer: >60 mL/min (ref >60)
GFR calc non Af Amer: >60 mL/min (ref >60)
Glucose, Bld: 85 mg/dL (ref 65–99)
POTASSIUM: 4 mmol/L (ref 3.5–5.1)
Sodium: 138 mmol/L (ref 135–145)

## 2015-05-01 LAB — CBC
HCT: 27.5 % — ABNORMAL LOW (ref 36.0–46.0)
HEMOGLOBIN: 9.3 g/dL — AB (ref 12.0–15.0)
MCH: 34.6 pg — AB (ref 26.0–34.0)
MCHC: 33.8 g/dL (ref 30.0–36.0)
MCV: 102.2 fL — AB (ref 78.0–100.0)
PLATELETS: 84 10*3/uL — AB (ref 150–400)
RBC: 2.69 MIL/uL — AB (ref 3.87–5.11)
RDW: 20.3 % — AB (ref 11.5–15.5)
WBC: 4.4 10*3/uL (ref 4.0–10.5)

## 2015-05-01 SURGERY — EGD (ESOPHAGOGASTRODUODENOSCOPY)
Anesthesia: Moderate Sedation

## 2015-05-01 MED ORDER — BUTAMBEN-TETRACAINE-BENZOCAINE 2-2-14 % EX AERO
INHALATION_SPRAY | CUTANEOUS | Status: DC | PRN
  Administered 2015-05-01: 2 via TOPICAL

## 2015-05-01 MED ORDER — MEPERIDINE HCL 50 MG/ML IJ SOLN
INTRAMUSCULAR | Status: DC | PRN
  Administered 2015-05-01 (×2): 25 mg via INTRAVENOUS

## 2015-05-01 MED ORDER — PANTOPRAZOLE SODIUM 40 MG PO TBEC
40.0000 mg | DELAYED_RELEASE_TABLET | Freq: Every day | ORAL | Status: DC
  Administered 2015-05-02 – 2015-05-04 (×3): 40 mg via ORAL
  Filled 2015-05-01 (×3): qty 1

## 2015-05-01 MED ORDER — SIMETHICONE 40 MG/0.6ML PO SUSP
ORAL | Status: AC
  Filled 2015-05-01: qty 1.8

## 2015-05-01 MED ORDER — HEPATITIS B VAC RECOMBINANT 10 MCG/ML IJ SUSP
1.0000 mL | Freq: Once | INTRAMUSCULAR | Status: DC
  Filled 2015-05-01: qty 1

## 2015-05-01 MED ORDER — MIDAZOLAM HCL 5 MG/5ML IJ SOLN
INTRAMUSCULAR | Status: DC | PRN
  Administered 2015-05-01 (×5): 2 mg via INTRAVENOUS

## 2015-05-01 MED ORDER — STERILE WATER FOR IRRIGATION IR SOLN
Status: DC | PRN
  Administered 2015-05-01: 16:00:00

## 2015-05-01 MED ORDER — HEPATITIS B VAC RECOMBINANT 10 MCG/ML IJ SUSP
1.0000 mL | Freq: Once | INTRAMUSCULAR | Status: AC
  Administered 2015-05-01: 10 ug via INTRAMUSCULAR
  Filled 2015-05-01: qty 1

## 2015-05-01 MED ORDER — TRAMADOL HCL 50 MG PO TABS
50.0000 mg | ORAL_TABLET | Freq: Once | ORAL | Status: AC
  Administered 2015-05-01: 50 mg via ORAL
  Filled 2015-05-01: qty 1

## 2015-05-01 MED ORDER — MIDAZOLAM HCL 5 MG/5ML IJ SOLN
INTRAMUSCULAR | Status: AC
  Filled 2015-05-01: qty 10

## 2015-05-01 MED ORDER — MEPERIDINE HCL 50 MG/ML IJ SOLN
INTRAMUSCULAR | Status: AC
  Filled 2015-05-01: qty 1

## 2015-05-01 MED ORDER — ALBUMIN HUMAN 25 % IV SOLN
50.0000 g | Freq: Every day | INTRAVENOUS | Status: AC
  Administered 2015-05-02 – 2015-05-03 (×2): 50 g via INTRAVENOUS
  Filled 2015-05-01 (×3): qty 200

## 2015-05-01 NOTE — Progress Notes (Signed)
PPD read from patient's LFA.  No induration noted.  Site WDL.

## 2015-05-01 NOTE — Progress Notes (Signed)
[facility] PROGRESS NOTE  Cynthia CravenLinda Stephenson ZOX:[id number] DOB: [birth year] DOA: 04/28/2015 PCP: Catalina PizzaHALL, ZACH, MD  Assessment/Plan: Decompensation of cirrhosis of liver- volume statis -6.9L. Weight down 109.5kg down from 114.2kg on admission. Continue IV lasix and albumin per GI. Appreciate GI assistance. Reports continued improvement with swelling of abdomen and LE. Vitals remain stable. Oxygen saturation level 98% on room air. [facility] evaluation in progress  Active Problems:  Dyspnea: resolved. related to #1.    Thrombocytopenia: stable.  Related to #1. No s/sx bleeding.    Seizure disorder stable   Cirrhosis of liver with ascites: [facility] EGD today for  esophageal varices for primary prophylaxis before discharge. Continue PPI  Code Status: full Family Communication: none present Disposition Plan: home hopefully 24-48 hours   Consultants:  GI  Procedures:  EGD  Antibiotics:  none  HPI/Subjective: Sitting up in bed. Reports anxiety about EGD. Denies pain/discomfort  Objective: Filed Vitals:   05/01/15 0544  BP: 111/49  Pulse: 79  Temp: 98 F (36.7 C)  Resp: 19    Intake/Output Summary (Last 24 hours) at 05/01/15 0927 Last data filed at 05/01/15 0546  Gross per 24 hour  Intake    923 ml  Output   4550 ml  Net  -3627 ml   Filed Weights   04/29/15 0700 04/30/15 0532 05/01/15 0544  Weight: 114.261 kg (251 lb 14.4 oz) 112.537 kg (248 lb 1.6 oz) 109.544 kg (241 lb 8 oz)    Exam:   General: obese appears calm  Cardiovascular: rrr no m/g/r 1+ LE edema  Respiratory: normal effort BS clear to ascultation no wheeze no crackles  Abdomen: obese less tight +BS non-tender to palpation  Musculoskeletal: no clubbing or cyanosis   Data Reviewed: Basic Metabolic Panel:  Recent Labs Lab 04/28/15 2140 04/29/15 0613 04/30/15 0611 05/01/15 0634  NA 133* 134* 137 138  K 4.3 4.0 4.0 4.0  CL 100* 102 101 104  CO2 27 27 26 27    GLUCOSE 96 78 79 85  BUN 14 15 15 15   CREATININE 0.63 0.59 0.64 0.65  CALCIUM 8.3* 8.0* 8.4* 8.6*   Liver Function Tests:  Recent Labs Lab 04/28/15 2140 04/30/15 0611  AST 84* 78*  ALT 44 37  ALKPHOS 129* 118  BILITOT 8.4* 7.7*  PROT 5.9* 6.0*  ALBUMIN 2.4* 2.9*   No results for input(s): LIPASE, AMYLASE in the last 168 hours. No results for input(s): AMMONIA in the last 168 hours. CBC:  Recent Labs Lab 04/28/15 2140 04/29/15 0613 04/30/15 0611 05/01/15 0634  WBC 8.3 5.9 6.7 4.4  NEUTROABS 5.0  --   --   --   HGB 10.8* 9.8* 10.3* 9.3*  HCT 31.8* 28.6* 30.5* 27.5*  MCV 100.3* 100.0 101.7* 102.2*  PLT 111* 94* 114* 84*   Cardiac Enzymes: No results for input(s): CKTOTAL, CKMB, CKMBINDEX, TROPONINI in the last 168 hours. BNP (last 3 results)  Recent Labs  03/19/15 1340  BNP 158.0*    ProBNP (last 3 results) No results for input(s): PROBNP in the last 8760 hours.  CBG: No results for input(s): GLUCAP in the last 168 hours.  No results found for this or any previous visit (from the past 240 hour(s)).   Studies: No results found.  Scheduled Meds: . albumin human  50 g Intravenous Daily  . furosemide  60 mg Intravenous Q12H  . lactulose  20 g Oral TID  . levETIRAcetam  500 mg Oral BID  . levothyroxine  75 mcg Oral QAC breakfast  . simethicone      . sodium chloride  3 mL Intravenous Q12H  . spironolactone  100 mg Oral BID  . tuberculin  5 Units Intradermal Once   Continuous Infusions: . sodium chloride      Principal Problem:   Decompensation of cirrhosis of liver Active Problems:   Seizure disorder   Cirrhosis of liver with ascites   Dyspnea   Thrombocytopenia   Cirrhosis   Peripheral edema    Time spent: 20 minutes    [facility] IncBLACK,KAREN M  [facility] Pager [phone]. If 7PM-7AM, please contact night-coverage at [email or web address], password [email or web address] 05/01/2015, 9:27 AM  LOS: 3 days

## 2015-05-01 NOTE — Op Note (Signed)
EGD PROCEDURE REPORT  PATIENT:  Cynthia CravenLinda Stephenson  MR#:  [id number] Birthdate:  21-Mar-1966, 49 y.o., female Endoscopist:  Dr. Malissa HippoNajeeb U. Rehman, MD Referred By:  Dr. 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 She is presently hospitalized for fluid overload and is responding well to IV albumin and furosemide. She is undergoing EGD with therapeutic intervention if she is found to have large esophageal varices.            Informed Consent:  The risks, benefits, alternatives & imponderables which include, but are not limited to, bleeding, infection, perforation, drug reaction and potential missed lesion have been reviewed.  The potential for biopsy, lesion removal, esophageal dilation, etc. have also been discussed.  Questions have been answered.  All parties agreeable.  Please see history & physical in medical record for more information.  Medications:  Demerol 50 mg IV Versed 10 mg IV Cetacaine spray topically for oropharyngeal anesthesia  Description of procedure:  The endoscope was introduced through the mouth and advanced to the second portion of the duodenum without difficulty or limitations. The mucosal surfaces were surveyed very carefully during advancement of the scope and upon withdrawal.  Findings:  Esophagus:  Mucosa of the proximal and middle third was normal. Distally there was single for column of grade II esophageal varix. GEJ:  35 cm Hiatus:  37 cm Stomach:  Stomach was empty and distended very well with insufflation. Folds in the proximal stomach were normal. Examination of mucosa at gastric body revealed mosaic pattern and red spots. There was nodularity to antral mucosa with erosions and a single 3 mm ulcer towards lesser curvature. Angularis fundus and cardia were examined by retroflexing the scope.  No fundal varices present. Duodenum:  Two bulbar erosions noted. Post bulbar mucosa was normal.  Therapeutic/Diagnostic Maneuvers Performed:  None  Complications:  None  Impression: Single short column of grade II esophageal varix too small to be banded. Small sliding hiatal hernia. Portal gastropathy. 3 mm gastric ulcer at antrum. Erosive bulbar duodenitis.  Recommendations:  H. pylori serology. Pantoprazole 40 mg by mouth every morning. Will continue with IV albumin followed by furosemide for two more days. Patient will be receiving hep B vaccination as discussed with Dr. Kerry HoughMemon.   Please note PPD is negative at 48 hours.  REHMAN,NAJEEB U  05/01/2015  4:06 PM  CC: Dr. Catalina PizzaHALL, ZACH, MD & Dr. Bonnetta BarryNo ref. provider found CC: Dr. Beverly SessionsPaul H. Julieta GuttingHayashi, MD, [facility].

## 2015-05-01 NOTE — Progress Notes (Signed)
Pt requested medication for a headache. Notified mid-level, will follow orders as advised.

## 2015-05-02 DIAGNOSIS — K7290 Hepatic failure, unspecified without coma: Secondary | ICD-10-CM

## 2015-05-02 LAB — BASIC METABOLIC PANEL
ANION GAP: 6 (ref 5–15)
BUN: 14 mg/dL (ref 6–20)
CO2: 27 mmol/L (ref 22–32)
Calcium: 8.6 mg/dL — ABNORMAL LOW (ref 8.9–10.3)
Chloride: 103 mmol/L (ref 101–111)
Creatinine, Ser: 0.59 mg/dL (ref 0.44–1.00)
GFR calc non Af Amer: >60 mL/min (ref >60)
Glucose, Bld: 80 mg/dL (ref 65–99)
Potassium: 4.1 mmol/L (ref 3.5–5.1)
Sodium: 136 mmol/L (ref 135–145)

## 2015-05-02 LAB — CBC
HEMATOCRIT: 27.3 % — AB (ref 36.0–46.0)
Hemoglobin: 9.3 g/dL — ABNORMAL LOW (ref 12.0–15.0)
MCH: 34.8 pg — AB (ref 26.0–34.0)
MCHC: 34.1 g/dL (ref 30.0–36.0)
MCV: 102.2 fL — AB (ref 78.0–100.0)
Platelets: 89 10*3/uL — ABNORMAL LOW (ref 150–400)
RBC: 2.67 MIL/uL — AB (ref 3.87–5.11)
RDW: 19.7 % — AB (ref 11.5–15.5)
WBC: 4.6 10*3/uL (ref 4.0–10.5)

## 2015-05-02 MED ORDER — TRAMADOL HCL 50 MG PO TABS
50.0000 mg | ORAL_TABLET | Freq: Four times a day (QID) | ORAL | Status: DC | PRN
  Administered 2015-05-02 – 2015-05-03 (×3): 50 mg via ORAL
  Filled 2015-05-02 (×3): qty 1

## 2015-05-02 MED ORDER — URSODIOL 300 MG PO CAPS
300.0000 mg | ORAL_CAPSULE | Freq: Three times a day (TID) | ORAL | Status: DC
  Administered 2015-05-02 – 2015-05-04 (×7): 300 mg via ORAL
  Filled 2015-05-02 (×13): qty 1

## 2015-05-02 NOTE — Progress Notes (Signed)
Patient ID: Cynthia CravenLinda Stephenson, female   DOB: 09-26-1966, 49 y.o.   MRN: [id number]   Assessment/Plan: Admitted with decompensated liver disease/ANASARCA. DIURESING WITH ALBUMIN/LASIX. WEIGHT DOWN FROM 251 LBS TO 237 LBS.  PLAN: 1. ADD URSO 300 MG TID 2. CONTINUE LASIX/ALBUMIN/ALDACTONE/PROTONIX    Subjective: PT ADMITTED MAY 17 WITH SWELLING IN LEGS. No questions or concerns EXCEPT SHE WAS WONDERING WHY SHE WASN'T GETTING HER URSODIOL. AT HOME SHE IS ON 250 MG TID.  Objective: Vital signs in last 24 hours: Filed Vitals:   05/02/15 0652  BP: 96/46  Pulse: 74  Temp: 98.8 F (37.1 C)  Resp: 20     General appearance: alert, cooperative and no distress HEENT: SCLERA ICTERIC Resp: clear to auscultation bilaterally Cardio: regular rate and rhythm GI: soft, non-tender; bowel sounds normal;  Extremities: edema 1-2+, OCCLUSIVE DRESSING ON MEDIAL LEFT CALF SKIN: ERYTHEMATOUS MACULAR LESION IN MEDIAL UPPER THIGHS  Lab Results:  Hb 9.3(9.3-10.3) PLT 84-114 T BILI 7.7  Studies/Results: No results found.  Medications: I have reviewed the patient's current medications.   LOS: 5 days   Jonette EvaSandi Brentley [place] 05/22/2014, 2:23 PM

## 2015-05-02 NOTE — Progress Notes (Signed)
[facility] PROGRESS NOTE  Cynthia CravenLinda Stephenson UJW:[id number]:[id number] DOB: Oct 22, 1966 DOA: 04/28/2015 PCP: Cynthia PizzaHALL, ZACH, MD  Assessment/Plan: Decompensation of cirrhosis of liver- Weight down 107.7kg down from 114.2kg on admission. Continue IV lasix and albumin per GI. Appreciate GI assistance. Reports continued improvement with swelling of abdomen and LE. Vitals remain stable. Oxygen saturation level 98% on room air. [facility] evaluation in progress. Needs continued intravenous diuresis for significant peripheral edema and volume overload  Active Problems:  Dyspnea: resolved. related to #1.    Thrombocytopenia: stable.  Related to #1. No s/sx bleeding.    Seizure disorder stable   Cirrhosis of liver with ascites: [facility]. EGD today for  esophageal varices for primary prophylaxis before discharge. Continue PPI  Code Status: full Family Communication: none present Disposition Plan: home when adequately diuresed   Consultants:  GI  Procedures:  EGD  Antibiotics:  none  HPI/Subjective: Reports good urine output. Feels swelling in legs is getting better  Objective: Filed Vitals:   05/02/15 1422  BP: 100/40  Pulse: 83  Temp: 98.3 F (36.8 C)  Resp: 20    Intake/Output Summary (Last 24 hours) at 05/02/15 1548 Last data filed at 05/02/15 1430  Gross per 24 hour  Intake    900 ml  Output   3300 ml  Net  -2400 ml   Filed Weights   04/30/15 0532 05/01/15 0544 05/02/15 [id number]  Weight: 112.537 kg (248 lb 1.6 oz) 109.544 kg (241 lb 8 oz) 107.775 kg (237 lb 9.6 oz)    Exam:   General: obese appears calm, sitting up in bed  Cardiovascular: rrr no m/g/r 2+ LE edema  Respiratory: crackles at bases  Abdomen: obese less tight +BS non-tender to palpation  Musculoskeletal: no clubbing or cyanosis   Data Reviewed: Basic Metabolic Panel:  Recent Labs Lab 04/28/15 2140 04/29/15 0613 04/30/15 0611 05/01/15 0634 05/02/15 0629  NA 133* 134* 137 138  136  K 4.3 4.0 4.0 4.0 4.1  CL 100* 102 101 104 103  CO2 27 27 26 27 27   GLUCOSE 96 78 79 85 80  BUN 14 15 15 15 14   CREATININE 0.63 0.59 0.64 0.65 0.59  CALCIUM 8.3* 8.0* 8.4* 8.6* 8.6*   Liver Function Tests:  Recent Labs Lab 04/28/15 2140 04/30/15 0611  AST 84* 78*  ALT 44 37  ALKPHOS 129* 118  BILITOT 8.4* 7.7*  PROT 5.9* 6.0*  ALBUMIN 2.4* 2.9*   No results for input(s): LIPASE, AMYLASE in the last 168 hours. No results for input(s): AMMONIA in the last 168 hours. CBC:  Recent Labs Lab 04/28/15 2140 04/29/15 0613 04/30/15 0611 05/01/15 0634 05/02/15 0629  WBC 8.3 5.9 6.7 4.4 4.6  NEUTROABS 5.0  --   --   --   --   HGB 10.8* 9.8* 10.3* 9.3* 9.3*  HCT 31.8* 28.6* 30.5* 27.5* 27.3*  MCV 100.3* 100.0 101.7* 102.2* 102.2*  PLT 111* 94* 114* 84* 89*   Cardiac Enzymes: No results for input(s): CKTOTAL, CKMB, CKMBINDEX, TROPONINI in the last 168 hours. BNP (last 3 results)  Recent Labs  03/19/15 1340  BNP 158.0*    ProBNP (last 3 results) No results for input(s): PROBNP in the last 8760 hours.  CBG: No results for input(s): GLUCAP in the last 168 hours.  No results found for this or any previous visit (from the past 240 hour(s)).   Studies: No results found.  Scheduled Meds: . albumin human  50 g Intravenous Daily  .  furosemide  60 mg Intravenous Q12H  . lactulose  20 g Oral TID  . levETIRAcetam  500 mg Oral BID  . levothyroxine  75 mcg Oral QAC breakfast  . pantoprazole  40 mg Oral QAC breakfast  . sodium chloride  3 mL Intravenous Q12H  . spironolactone  100 mg Oral BID  . ursodiol  300 mg Oral TID AC   Continuous Infusions: . sodium chloride 1,000 mL (05/01/15 1500)    Principal Problem:   Decompensation of cirrhosis of liver Active Problems:   Seizure disorder   Cirrhosis of liver with ascites   Dyspnea   Thrombocytopenia   Cirrhosis   Peripheral edema    Time spent: 20 minutes    Cynthia Stephenson  [facility] Pager  [phone]. If 7PM-7AM, please contact night-coverage at [email or web address], password [facility] 05/02/2015, 3:48 PM  LOS: 4 days

## 2015-05-03 LAB — BASIC METABOLIC PANEL
Anion gap: 7 (ref 5–15)
BUN: 15 mg/dL (ref 6–20)
CO2: 28 mmol/L (ref 22–32)
CREATININE: 0.69 mg/dL (ref 0.44–1.00)
Calcium: 8.7 mg/dL — ABNORMAL LOW (ref 8.9–10.3)
Chloride: 102 mmol/L (ref 101–111)
GFR calc Af Amer: >60 mL/min (ref >60)
GFR calc non Af Amer: >60 mL/min (ref >60)
Glucose, Bld: 97 mg/dL (ref 65–99)
Potassium: 3.8 mmol/L (ref 3.5–5.1)
SODIUM: 137 mmol/L (ref 135–145)

## 2015-05-03 NOTE — Progress Notes (Signed)
[facility] PROGRESS NOTE  Cynthia CravenLinda Stephenson UJW:[id number] DOB: 1965-12-13 DOA: 04/28/2015 PCP: Catalina PizzaHALL, ZACH, MD  Assessment/Plan: Decompensation of cirrhosis of liver- Weight down 106.4kg down from 114.2kg on admission. Continue IV lasix and albumin per GI. Appreciate GI assistance. Reports continued improvement with swelling of abdomen and LE. Vitals remain stable. Oxygen saturations >90% on room air and patient is breathing comfortably. [facility] evaluation in progress. Needs continued intravenous diuresis for significant peripheral edema and volume overload  Active Problems:  Dyspnea: resolved. related to #1.    Thrombocytopenia: stable.  Related to #1. No s/sx bleeding.    Seizure disorder stable   Cirrhosis of liver with ascites: [facility] EGD today for  esophageal varices for primary prophylaxis before discharge. Continue PPI  Code Status: full Family Communication: none present Disposition Plan: home when adequately diuresed   Consultants:  GI  Procedures:  EGD  Antibiotics:  none  HPI/Subjective: No new complaints, she is happy about her progress, feels that legs are less tight  Objective: Filed Vitals:   05/03/15 0540  BP: 120/49  Pulse: 77  Temp: 98.2 F (36.8 C)  Resp: 20    Intake/Output Summary (Last 24 hours) at 05/03/15 1258 Last data filed at 05/03/15 1211  Gross per 24 hour  Intake    480 ml  Output   3000 ml  Net  -2520 ml   Filed Weights   05/01/15 0544 05/02/15 0632 05/03/15 0540  Weight: 109.544 kg (241 lb 8 oz) 107.775 kg (237 lb 9.6 oz) 106.459 kg (234 lb 11.2 oz)    Exam:   General: obese appears calm, sitting up in bed, no distress  Cardiovascular: rrr no m/g/r 1-2+ LE edema  Respiratory: cta b  Abdomen: obese less tight +BS non-tender to palpation  Musculoskeletal: no clubbing or cyanosis   Data Reviewed: Basic Metabolic Panel:  Recent Labs Lab 04/29/15 0613 04/30/15 0611 05/01/15 0634  05/02/15 0629 05/03/15 0620  NA 134* 137 138 136 137  K 4.0 4.0 4.0 4.1 3.8  CL 102 101 104 103 102  CO2 27 26 27 27 28   GLUCOSE 78 79 85 80 97  BUN 15 15 15 14 15   CREATININE 0.59 0.64 0.65 0.59 0.69  CALCIUM 8.0* 8.4* 8.6* 8.6* 8.7*   Liver Function Tests:  Recent Labs Lab 04/28/15 2140 04/30/15 0611  AST 84* 78*  ALT 44 37  ALKPHOS 129* 118  BILITOT 8.4* 7.7*  PROT 5.9* 6.0*  ALBUMIN 2.4* 2.9*   No results for input(s): LIPASE, AMYLASE in the last 168 hours. No results for input(s): AMMONIA in the last 168 hours. CBC:  Recent Labs Lab 04/28/15 2140 04/29/15 0613 04/30/15 0611 05/01/15 0634 05/02/15 0629  WBC 8.3 5.9 6.7 4.4 4.6  NEUTROABS 5.0  --   --   --   --   HGB 10.8* 9.8* 10.3* 9.3* 9.3*  HCT 31.8* 28.6* 30.5* 27.5* 27.3*  MCV 100.3* 100.0 101.7* 102.2* 102.2*  PLT 111* 94* 114* 84* 89*   Cardiac Enzymes: No results for input(s): CKTOTAL, CKMB, CKMBINDEX, TROPONINI in the last 168 hours. BNP (last 3 results)  Recent Labs  03/19/15 1340  BNP 158.0*    ProBNP (last 3 results) No results for input(s): PROBNP in the last 8760 hours.  CBG: No results for input(s): GLUCAP in the last 168 hours.  No results found for this or any previous visit (from the past 240 hour(s)).   Studies: No results found.  Scheduled Meds: .  albumin human  50 g Intravenous Daily  . furosemide  60 mg Intravenous Q12H  . lactulose  20 g Oral TID  . levETIRAcetam  500 mg Oral BID  . levothyroxine  75 mcg Oral QAC breakfast  . pantoprazole  40 mg Oral QAC breakfast  . sodium chloride  3 mL Intravenous Q12H  . spironolactone  100 mg Oral BID  . ursodiol  300 mg Oral TID AC   Continuous Infusions: . sodium chloride 1,000 mL (05/01/15 1500)    Principal Problem:   Decompensation of cirrhosis of liver Active Problems:   Seizure disorder   Cirrhosis of liver with ascites   Dyspnea   Thrombocytopenia   Cirrhosis   Peripheral edema    Time spent: 20  minutes    Bayley Yarborough  [facility] Pager [phone]. If 7PM-7AM, please contact night-coverage at [email or web address], password [facility] 05/03/2015, 12:58 PM  LOS: 5 days

## 2015-05-04 LAB — BASIC METABOLIC PANEL
Anion gap: 9 (ref 5–15)
BUN: 13 mg/dL (ref 6–20)
CO2: 28 mmol/L (ref 22–32)
Calcium: 9.2 mg/dL (ref 8.9–10.3)
Chloride: 103 mmol/L (ref 101–111)
Creatinine, Ser: 0.6 mg/dL (ref 0.44–1.00)
GFR calc Af Amer: >60 mL/min (ref >60)
GFR calc non Af Amer: >60 mL/min (ref >60)
Glucose, Bld: 83 mg/dL (ref 65–99)
Potassium: 4.2 mmol/L (ref 3.5–5.1)
Sodium: 140 mmol/L (ref 135–145)

## 2015-05-04 LAB — H. PYLORI ANTIBODY, IGG: H PYLORI IGG: 1.4 U/mL — AB (ref 0.0–0.8)

## 2015-05-04 LAB — ALBUMIN: Albumin: 4 g/dL (ref 3.5–5.0)

## 2015-05-04 MED ORDER — LACTULOSE 10 GM/15ML PO SOLN: 20.0000 g | Freq: Three times a day (TID) | ORAL | Status: AC

## 2015-05-04 MED ORDER — PANTOPRAZOLE SODIUM 40 MG PO TBEC: 40.0000 mg | DELAYED_RELEASE_TABLET | Freq: Every day | ORAL | Status: DC

## 2015-05-04 MED ORDER — TORSEMIDE 20 MG PO TABS: 40.0000 mg | ORAL_TABLET | Freq: Two times a day (BID) | ORAL | Status: DC

## 2015-05-04 MED ORDER — TRIAMCINOLONE ACETONIDE 0.1 % EX CREA: 1.0000 "application " | TOPICAL_CREAM | Freq: Two times a day (BID) | CUTANEOUS | Status: DC | PRN

## 2015-05-04 NOTE — Care Management Note (Signed)
Case Management Note  Patient Details  Name: Cynthia CravenLinda Stephenson MRN: [id number] Date of Birth: [birth year]  Subjective/Objective:                    Action/Plan:   Expected Discharge Date:  05/04/15               Expected Discharge Plan:  Home/Self Care  In-House Referral:  NA  Discharge planning Services  CM Consult  Post Acute Care Choice:  Durable Medical Equipment Choice offered to:  Patient  DME Arranged:  3-N-1 DME Agency:  [facility]  HH Arranged:    HH Agency:     Status of Service:  Completed, signed off  Medicare Important Message Given:    Date Medicare IM Given:    Medicare IM give by:    Date Additional Medicare IM Given:    Additional Medicare Important Message give by:     If discussed at Long Length of Stay Meetings, dates discussed:    Additional Comments: Pt discharged home today. [facility] arranged with [facility] and they delivered DME to pts home. No other CM needs noted. Arlyss QueenBlackwell, Rane Dumm [place], RN 05/04/2015, 12:55 PM

## 2015-05-04 NOTE — Progress Notes (Signed)
Pt's IV catheter removed and intact. Pt's IV site clean dry and intact. Discharge instructions were reviewed and discussed with patient and her daughter. Follow up appointments were reviewed and discussed. Prescriptions were reviewed and discussed. All questions were answered and no further questions at this time. Pt in stable condition and in no acute distress at time of discharge. Pt escorted by nurse tech.

## 2015-05-04 NOTE — Discharge Summary (Signed)
Physician Discharge Summary  Cynthia Stephenson XBJ:[id number] DOB: 08/27/1966 DOA: 04/28/2015  PCP: [name], MD  Admit date: 04/28/2015 Discharge date: 05/04/2015  Time spent: 30 minutes  Recommendations for Outpatient Follow-up:  1. Dr Patty Sermons office will contact for appointment 4 weeks.  2. Follow up with Kendell Bane as previously scheduled  Discharge Diagnoses:  Principal Problem:   Decompensation of cirrhosis of liver Active Problems:   Seizure disorder   Cirrhosis of liver with ascites   Dyspnea   Thrombocytopenia   Cirrhosis   Peripheral edema   Discharge Condition: stable  Diet recommendation: heart healthy  Filed Weights   05/02/15 3086 05/03/15 0540 05/04/15 0629  Weight: 107.775 kg (237 lb 9.6 oz) 106.459 kg (234 lb 11.2 oz) 104.01 kg (229 lb 4.8 oz)    History of present illness:  49 yo female h/o cirrhosis of the liver undergoing evaluation at [facility] for [facility] presented to ED on 04/28/15 with approximately 30lbs weight gain in prior week. Taking  lasix po daily, no fevers. No abd pain. Sob especially with lying flat. She takes her medications daily. She denied any pain. No cough  Hospital Course:  Decompensation of cirrhosis of liver- Weight down 104.0kg down from 114.2kg on admission. She was provided with IV lasix and albumin per GI. Appreciate GI assistance. gradual improvement of swelling of abdomen and LE. Vitals remained stable. renal function remained stable. [facility] evaluation in progress. Will discharge with demadex  And continue spironolactone. Instructed to weigh daily and report weight gain of 5lbs or more to GI  Active Problems:  Dyspnea: resolved. related to #1.    Thrombocytopenia: stable. Related to #1. No s/sx bleeding.    Seizure disorder stable   Cirrhosis of liver with ascites: under care [facility] for transplant. EGD  for esophageal varices for primary prophylaxis reveals 3mm antral ulcer.  before discharge. Continue PPI  Gastric  ulcer: EGD 5/216. Continue PPI and h pylori serology to be followed by GI  Procedures:  EGD  Consultations:  GI  Discharge Exam: Filed Vitals:   05/04/15 0629  BP: 111/50  Pulse: 74  Temp: 98.3 F (36.8 C)  Resp: 20    General: well nourished appears comfortable Cardiovascular: RRR no MGR 1+LE edema Respiratory: normal effort , BS clear  Discharge Instructions   Discharge Instructions    Diet - low sodium heart healthy    Complete by:  As directed      Discharge instructions    Complete by:  As directed   Follow up with GI as recommended     Increase activity slowly    Complete by:  As directed           Current Discharge Medication List    START taking these medications   Details  pantoprazole (PROTONIX) 40 MG tablet Take 1 tablet (40 mg total) by mouth daily before breakfast. Qty: 30 tablet, Refills: 1    torsemide (DEMADEX) 20 MG tablet Take 2 tablets (40 mg total) by mouth 2 (two) times daily. Qty: 60 tablet, Refills: 1      CONTINUE these medications which have CHANGED   Details  lactulose (CHRONULAC) 10 GM/15ML solution Take 30 mLs (20 g total) by mouth 3 (three) times daily. Qty: 240 mL, Refills: 0      CONTINUE these medications which have NOT CHANGED   Details  levETIRAcetam (KEPPRA) 500 MG tablet Take 1 tablet (500 mg total) by mouth 2 (two) times daily. Qty: 180 tablet, Refills: 4  levothyroxine (SYNTHROID, LEVOTHROID) 75 MCG tablet Take 75 mcg by mouth daily before breakfast.    LORazepam (ATIVAN) 0.5 MG tablet Take 1 tablet (0.5 mg total) by mouth as needed for anxiety. Qty: 30 tablet, Refills: 0    Multiple Vitamin (MULTIVITAMIN WITH MINERALS) TABS tablet Take 1 tablet by mouth daily.    nystatin ointment (MYCOSTATIN) Apply 1 application topically daily as needed (rash).  Refills: 0    Powders (ANTI MONKEY BUTT) POWD Apply 1 application topically daily as needed (for moisture).    spironolactone (ALDACTONE) 100 MG tablet Take 1  tablet (100 mg total) by mouth 2 (two) times daily. Qty: 30 tablet, Refills: 3   Associated Diagnoses: NAFLD (nonalcoholic fatty liver disease)    traMADol (ULTRAM) 50 MG tablet Take 50 mg by mouth every 6 (six) hours as needed. pain Refills: 0    triamcinolone cream (KENALOG) 0.1 % Apply 1 application topically 2 (two) times daily as needed. itching Refills: 0    ursodiol (ACTIGALL) 300 MG capsule Take 1 capsule (300 mg total) by mouth 3 (three) times daily. Qty: 90 capsule, Refills: 4      STOP taking these medications     ciprofloxacin (CIPRO) 500 MG tablet      furosemide (LASIX) 20 MG tablet        No Known Allergies Follow-up Information    Follow up with Malissa HippoEHMAN,NAJEEB U, MD.   Specialty:  Gastroenterology   Why:  office will call   Contact information:   [address], [address] [place] [address] [phone]        The results of significant diagnostics from this hospitalization (including imaging, microbiology, ancillary and laboratory) are listed below for reference.    Significant Diagnostic Studies: No results found.  Microbiology: No results found for this or any previous visit (from the past 240 hour(s)).   Labs: Basic Metabolic Panel:  Recent Labs Lab 04/30/15 0611 05/01/15 0634 05/02/15 0629 05/03/15 0620 05/04/15 0642  NA 137 138 136 137 140  K 4.0 4.0 4.1 3.8 4.2  CL 101 104 103 102 103  CO2 26 27 27 28 28   GLUCOSE 79 85 80 97 83  BUN 15 15 14 15 13   CREATININE 0.64 0.65 0.59 0.69 0.60  CALCIUM 8.4* 8.6* 8.6* 8.7* 9.2   Liver Function Tests:  Recent Labs Lab 04/28/15 2140 04/30/15 0611 05/04/15 0642  AST 84* 78*  --   ALT 44 37  --   ALKPHOS 129* 118  --   BILITOT 8.4* 7.7*  --   PROT 5.9* 6.0*  --   ALBUMIN 2.4* 2.9* 4.0   No results for input(s): LIPASE, AMYLASE in the last 168 hours. No results for input(s): AMMONIA in the last 168 hours. CBC:  Recent Labs Lab 04/28/15 2140 04/29/15 0613 04/30/15 0611  05/01/15 0634 05/02/15 0629  WBC 8.3 5.9 6.7 4.4 4.6  NEUTROABS 5.0  --   --   --   --   HGB 10.8* 9.8* 10.3* 9.3* 9.3*  HCT 31.8* 28.6* 30.5* 27.5* 27.3*  MCV 100.3* 100.0 101.7* 102.2* 102.2*  PLT 111* 94* 114* 84* 89*   Cardiac Enzymes: No results for input(s): CKTOTAL, CKMB, CKMBINDEX, TROPONINI in the last 168 hours. BNP: BNP (last 3 results)  Recent Labs  03/19/15 1340  BNP 158.0*    ProBNP (last 3 results) No results for input(s): PROBNP in the last 8760 hours.  CBG: No results for input(s): GLUCAP in the last 168 hours.  SignedGwenyth Bender  [facility] 05/04/2015, 11:57 AM

## 2015-05-04 NOTE — Progress Notes (Signed)
  Subjective:  Patient feels much better. She has noted a rash over her legs with itching. She denies abdominal pain or shortness of breath.     Objective: Blood pressure 111/50, pulse 74, temperature 98.3 F (36.8 C), temperature source Oral, resp. rate 20, height 5\' 2"  (1.575 m), weight 229 lb 4.8 oz (104.01 kg), SpO2 96 %. Patient is alert and does not have asterixis. She has tremors to both hands. Conjunctiva is pink. Sclera is icteric Abdomen is full but soft and nontender. Ulcer at skin biopsy site at left proximal leg is smaller and has scab. 1+ pitting edema noted to both legs along with nonpitting pretibial edema. Patchy macular rash noted over thighs and legs but not over abdomen or chest.  Labs/studies Results:   Recent Labs  05/02/15 0629  WBC 4.6  HGB 9.3*  HCT 27.3*  PLT 89*    BMET   Recent Labs  05/02/15 0629 05/03/15 0620 05/04/15 0642  NA 136 137 140  K 4.1 3.8 4.2  CL 103 102 103  CO2 27 28 28   GLUCOSE 80 97 83  BUN 14 15 13   CREATININE 0.59 0.69 0.60  CALCIUM 8.6* 8.7* 9.2    LFT   Recent Labs  05/04/15 0642  ALBUMIN 4.0    H. pylori serology is pending.   Assessment:  #1. Fluid overload with minimal ascites and significant third spacing secondary to cirrhosis and hypoalbuminemia. She has responded very nicely to IV albumin and furosemide and has lost 22 pounds. Electrolytes are normal and renal function remains well preserved. She should continue to respond to by mouth diuretics until serum albumin drops again. #2. 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 [facility]. She is not yet actively listed. #3. Gastric ulcer. EGD three days ago revealed 3 mm antral ulcer. Patient is on PPI and H. pylori serology is pending. #4. Anemia appears to be due to chronic disease. No evidence of GI bleeding. #5. Thrombocytopenia secondary to chronic liver  disease and splenomegaly. #6. Skin rash involving both lower extremities possibly related to edema and stasis. She used topical agent given to her by Dr. [name] HalterJack Hall.    Recommendations;  Patient ready for discharge from GI standpoint. Continue spironolactone and furosemide or torsemide. Patient advised to check her weight every morning starting today. Patient advised to call office if she has gained more than 5 pounds. Metabolic-7 in 2 weeks. Office will call. Office visit in 4 weeks.

## 2015-05-05 ENCOUNTER — Encounter (HOSPITAL_COMMUNITY): Payer: Self-pay | Admitting: Internal Medicine

## 2015-05-05 ENCOUNTER — Other Ambulatory Visit (INDEPENDENT_AMBULATORY_CARE_PROVIDER_SITE_OTHER): Payer: Self-pay | Admitting: Internal Medicine

## 2015-05-05 MED ORDER — BIS SUBCIT-METRONID-TETRACYC 140-125-125 MG PO CAPS: 3.0000 | ORAL_CAPSULE | Freq: Three times a day (TID) | ORAL | Status: DC

## 2015-05-06 ENCOUNTER — Telehealth (INDEPENDENT_AMBULATORY_CARE_PROVIDER_SITE_OTHER): Payer: Self-pay | Admitting: Internal Medicine

## 2015-05-06 NOTE — Telephone Encounter (Signed)
Patient called earlier today to let me know her weight was down to 217 pounds. She was advised to decrease Demadex dose to 20 mg by mouth twice a day.

## 2015-05-07 ENCOUNTER — Telehealth (INDEPENDENT_AMBULATORY_CARE_PROVIDER_SITE_OTHER): Payer: Self-pay | Admitting: *Deleted

## 2015-05-07 ENCOUNTER — Encounter (INDEPENDENT_AMBULATORY_CARE_PROVIDER_SITE_OTHER): Payer: Self-pay | Admitting: *Deleted

## 2015-05-07 DIAGNOSIS — K7460 Unspecified cirrhosis of liver: Secondary | ICD-10-CM

## 2015-05-07 DIAGNOSIS — K760 Fatty (change of) liver, not elsewhere classified: Secondary | ICD-10-CM

## 2015-05-07 NOTE — Telephone Encounter (Signed)
Forwarded to Dr.Rehman for review. 

## 2015-05-07 NOTE — Telephone Encounter (Signed)
Patient should call office if her weight drops to below 215 pounds

## 2015-05-07 NOTE — Telephone Encounter (Signed)
Per Dr.Rehman the patient will need to have labs drawn on 05-15-15.

## 2015-05-07 NOTE — Telephone Encounter (Signed)
Cynthia Stephenson came by to pick up the samples of Pylera and a 11 day f/u has been scheduled with Cynthia Arerri [name], NP on 05/18/15. Cynthia Stephenson said Dr. Karilyn Stephenson told her if she lost weight to let him know. She has lost 2 lbs over night. Her return phone number is [phone].

## 2015-05-08 NOTE — Telephone Encounter (Signed)
Patient was called and made aware. She states that this morning that she weighed 217 lbs.

## 2015-05-12 ENCOUNTER — Telehealth (INDEPENDENT_AMBULATORY_CARE_PROVIDER_SITE_OTHER): Payer: Self-pay | Admitting: *Deleted

## 2015-05-12 DIAGNOSIS — K7460 Unspecified cirrhosis of liver: Secondary | ICD-10-CM

## 2015-05-12 DIAGNOSIS — K760 Fatty (change of) liver, not elsewhere classified: Secondary | ICD-10-CM

## 2015-05-12 LAB — CBC
HCT: 36.7 % (ref 36.0–46.0)
Hemoglobin: 12.8 g/dL (ref 12.0–15.0)
MCH: 33.6 pg (ref 26.0–34.0)
MCHC: 34.9 g/dL (ref 30.0–36.0)
MCV: 96.3 fL (ref 78.0–100.0)
MPV: 10.5 fL (ref 8.6–12.4)
PLATELETS: 166 10*3/uL (ref 150–400)
RBC: 3.81 MIL/uL — AB (ref 3.87–5.11)
RDW: 17.1 % — ABNORMAL HIGH (ref 11.5–15.5)
WBC: 10.7 10*3/uL — ABNORMAL HIGH (ref 4.0–10.5)

## 2015-05-12 LAB — HEPATIC FUNCTION PANEL
ALK PHOS: 111 U/L (ref 39–117)
ALT: 38 U/L — AB (ref 0–35)
AST: 75 U/L — AB (ref 0–37)
Albumin: 4 g/dL (ref 3.5–5.2)
BILIRUBIN DIRECT: 5.7 mg/dL — AB (ref 0.0–0.3)
Indirect Bilirubin: 4.7 mg/dL — ABNORMAL HIGH (ref 0.2–1.2)
TOTAL PROTEIN: 7 g/dL (ref 6.0–8.3)
Total Bilirubin: 10.4 mg/dL — ABNORMAL HIGH (ref 0.2–1.2)

## 2015-05-12 NOTE — Telephone Encounter (Signed)
Per Dr Laural Golden the patient may go ahead and get the following labs done - CBC,LFT, Magnesium , B-Met. He also ask that the patient stop taking fluid medication until lab results are in, patient was advised.

## 2015-05-12 NOTE — Telephone Encounter (Signed)
After husband has called Dr.Rehman - Dr.Rehman ask that she stop the Antibiotic.  Patient states that she stop 05/10/15. She is weak , she is asking for blood work.

## 2015-05-12 NOTE — Telephone Encounter (Signed)
Per Dr.Rehman = Would like for patient to get in 7 days of medication if she can. Patient may take with food. She has an appointment 05-18-15.

## 2015-05-12 NOTE — Telephone Encounter (Signed)
Cynthia Stephenson said Dr. Karilyn Cotaehman [facility]  and she has been sick the whole time. She is very weak, no energy and nauseated. Please return her call to [phone].

## 2015-05-13 ENCOUNTER — Telehealth (INDEPENDENT_AMBULATORY_CARE_PROVIDER_SITE_OTHER): Payer: Self-pay | Admitting: *Deleted

## 2015-05-13 LAB — BASIC METABOLIC PANEL
BUN: 36 mg/dL — ABNORMAL HIGH (ref 6–23)
CO2: 27 meq/L (ref 19–32)
Calcium: 9.5 mg/dL (ref 8.4–10.5)
Chloride: 88 mEq/L — ABNORMAL LOW (ref 96–112)
Creat: 1.03 mg/dL (ref 0.50–1.10)
Glucose, Bld: 128 mg/dL — ABNORMAL HIGH (ref 70–99)
POTASSIUM: 5.2 meq/L (ref 3.5–5.3)
SODIUM: 123 meq/L — AB (ref 135–145)

## 2015-05-13 LAB — MAGNESIUM: Magnesium: 2.3 mg/dL (ref 1.5–2.5)

## 2015-05-13 NOTE — Telephone Encounter (Signed)
Forwarded to Dr.Rehman to address. 

## 2015-05-13 NOTE — Telephone Encounter (Signed)
Has already been addressed 

## 2015-05-13 NOTE — Telephone Encounter (Signed)
Cynthia ClientHannah came be the office to see if she could speak with Dr. Karilyn Cotaehman. She is concerned about her mother, Cynthia Stephenson. She said Cynthia Stephenson has gotten worse and very week. Would like to get her lab results. Cynthia Minervadvised Cynthia Stephenson that Dr. Karilyn Cotaehman will call the patient once he has received and reviewed the labs. She keep assisting to speak with him. Told her I would send a message back to the nurse and she will let Dr. Karilyn Cotaehman know when she speak with him, that he is at the hospital today. I have since then received a call also. Cynthia Stephenson's return phone number is [phone].

## 2015-05-14 ENCOUNTER — Telehealth (INDEPENDENT_AMBULATORY_CARE_PROVIDER_SITE_OTHER): Payer: Self-pay | Admitting: *Deleted

## 2015-05-14 ENCOUNTER — Other Ambulatory Visit (INDEPENDENT_AMBULATORY_CARE_PROVIDER_SITE_OTHER): Payer: Self-pay | Admitting: Internal Medicine

## 2015-05-14 ENCOUNTER — Encounter (INDEPENDENT_AMBULATORY_CARE_PROVIDER_SITE_OTHER): Payer: Self-pay | Admitting: Internal Medicine

## 2015-05-14 DIAGNOSIS — K7460 Unspecified cirrhosis of liver: Secondary | ICD-10-CM

## 2015-05-14 DIAGNOSIS — K760 Fatty (change of) liver, not elsewhere classified: Secondary | ICD-10-CM

## 2015-05-14 MED ORDER — ONDANSETRON HCL 4 MG PO TABS
4.0000 mg | ORAL_TABLET | Freq: Three times a day (TID) | ORAL | Status: DC | PRN
Start: 2015-05-14 — End: 2017-03-28

## 2015-05-14 NOTE — Telephone Encounter (Signed)
Cynthia Stephenson can not remember how much of the fluid pill to take when she got to 220. She is also needing something for nausea. Her return phone number is [phone].

## 2015-05-14 NOTE — Telephone Encounter (Signed)
Lab order complete and faxed to lab. Patient states that Dr.Rehman told her that if her weight got up to 220 lbs, that she would need to start her fluid medications again. Previous to stopping them ,she was taking both of them twice a day. She forgot what he told her to take, this morning weight was 219 lbs 6 oz.

## 2015-05-14 NOTE — Telephone Encounter (Signed)
Rx for nausea eprescribed.

## 2015-05-16 ENCOUNTER — Other Ambulatory Visit (HOSPITAL_COMMUNITY)
Admission: RE | Admit: 2015-05-16 | Discharge: 2015-05-16 | Disposition: A | Discharge status: Home / Self Care | Payer: BLUE CROSS/BLUE SHIELD | Source: Ambulatory Visit | Attending: Internal Medicine | Admitting: Internal Medicine

## 2015-05-16 DIAGNOSIS — K7460 Unspecified cirrhosis of liver: Secondary | ICD-10-CM | POA: Insufficient documentation

## 2015-05-16 DIAGNOSIS — K760 Fatty (change of) liver, not elsewhere classified: Secondary | ICD-10-CM | POA: Insufficient documentation

## 2015-05-16 LAB — BASIC METABOLIC PANEL
Anion gap: 8 (ref 5–15)
BUN: 23 mg/dL — ABNORMAL HIGH (ref 6–20)
CALCIUM: 9.2 mg/dL (ref 8.9–10.3)
CHLORIDE: 96 mmol/L — AB (ref 101–111)
CO2: 21 mmol/L — ABNORMAL LOW (ref 22–32)
CREATININE: 0.85 mg/dL (ref 0.44–1.00)
GFR calc non Af Amer: >60 mL/min (ref >60)
Glucose, Bld: 117 mg/dL — ABNORMAL HIGH (ref 65–99)
Potassium: 5.3 mmol/L — ABNORMAL HIGH (ref 3.5–5.1)
Sodium: 125 mmol/L — ABNORMAL LOW (ref 135–145)

## 2015-05-18 ENCOUNTER — Encounter (INDEPENDENT_AMBULATORY_CARE_PROVIDER_SITE_OTHER): Payer: Self-pay | Admitting: Internal Medicine

## 2015-05-18 ENCOUNTER — Ambulatory Visit (INDEPENDENT_AMBULATORY_CARE_PROVIDER_SITE_OTHER): Payer: BLUE CROSS/BLUE SHIELD | Admitting: Internal Medicine

## 2015-05-18 ENCOUNTER — Other Ambulatory Visit (INDEPENDENT_AMBULATORY_CARE_PROVIDER_SITE_OTHER): Payer: Self-pay | Admitting: Internal Medicine

## 2015-05-18 VITALS — BP 140/58 | HR 72 | Temp 97.7°F | Ht 62.0 in | Wt 229.8 lb

## 2015-05-18 DIAGNOSIS — K760 Fatty (change of) liver, not elsewhere classified: Secondary | ICD-10-CM

## 2015-05-18 MED ORDER — LORAZEPAM 0.5 MG PO TABS: 0.5000 mg | ORAL_TABLET | ORAL | Status: DC | PRN

## 2015-05-18 NOTE — Progress Notes (Addendum)
Subjective:    Patient ID: Cynthia CravenLinda Stephenson, female    DOB: [birth year], 49 y.o.   MRN: [id number]  HPI Here today for f/u. She underwent an EGD 5/202/016 and found to have H. Pylori. Had been admitted to [facility] for fluid overload. She was started on Pylera 5/36/2016 x 4 days. She became sick and stopped the medication. Her fluid medications are on hold for now due to dehydration. She says her weight Saturday 05/16/2015 was 221. Today her weight is 230.  She feels 50%. There is no nausea and vomiting at this time.  She just feels weak.  Hx of NAFLD. [facility]. She is being evaluated for a liver transplant.  She is not exercising.   Cynthia Stephenson. Cynthia Stephenson underwent a liver biopsy in March of 2016 which revealed cholestatic liver injury with features of cirrhosis. Trichrome stain demonstrates nodular cirrhosis with broad zones of parenchymal dropout. Iron stain demonstrates 2+hemosiderin deposition. PAS positive intrahepatic deposits present.   Markers for Auto Immune liver disease were all normal.     Hepatic Function Panel     Component Value Date/Time   PROT 7.0 05/12/2015 1207   ALBUMIN 4.0 05/12/2015 1207   AST 75* 05/12/2015 1207   ALT 38* 05/12/2015 1207   ALKPHOS 111 05/12/2015 1207   BILITOT 10.4* 05/12/2015 1207   BILIDIR 5.7* 05/12/2015 1207   IBILI 4.7* 05/12/2015 1207      CMP Latest Ref Rng 05/16/2015 05/12/2015 05/04/2015  Glucose 65 - 99 mg/dL 782(N117(H) 562(Z128(H) 83  BUN 6 - 20 mg/dL 30(Q23(H) 65(H36(H) 13  Creatinine 0.44 - 1.00 mg/dL 8.460.85 9.621.03 9.520.60  Sodium 135 - 145 mmol/L 125(L) 123(L) 140  Potassium 3.5 - 5.1 mmol/L 5.3(H) 5.2 4.2  Chloride 101 - 111 mmol/L 96(L) 88(L) 103  CO2 22 - 32 mmol/L 21(L) 27 28  Calcium 8.9 - 10.3 mg/dL 9.2 9.5 9.2  Total Protein 6.0 - 8.3 g/dL - 7.0 -  Total Bilirubin 0.2 - 1.2 mg/dL - 10.4(H) -  Alkaline Phos 39 - 117 U/L - 111 -  AST 0 - 37 U/L - 75(H) -  ALT 0 - 35 U/L - 38(H) -       05/01/2015 EGD: looking for  varices. Impression: Single short column of grade II esophageal varix too small to be banded. Small sliding hiatal hernia. Portal gastropathy. 3 mm gastric ulcer at antrum. Erosive bulbar duodenitis.   Review of Systems Past Medical History  Diagnosis Date  . Thyroid disease   . Hypothyroidism   . Seizures   . Cirrhosis   . Thrombocytopenia     Past Surgical History  Procedure Laterality Date  . Abdominal hysterectomy    . Abdominal hysterectomy    . Liver biopsy    . Esophagogastroduodenoscopy N/A 05/01/2015    Procedure: ESOPHAGOGASTRODUODENOSCOPY (EGD);  Surgeon: Malissa HippoNajeeb U Rehman, MD;  Location: [facility];  Service: Endoscopy;  Laterality: N/A;    No Known Allergies  Current Outpatient Prescriptions on File Prior to Visit  Medication Sig Dispense Refill  . bismuth-metronidazole-tetracycline (PYLERA) 140-125-125 MG per capsule Take 3 capsules by mouth 4 (four) times daily -  before meals and at bedtime. 120 capsule 0  . lactulose (CHRONULAC) 10 GM/15ML solution Take 30 mLs (20 g total) by mouth 3 (three) times daily. 240 mL 0  . levETIRAcetam (KEPPRA) 500 MG tablet Take 1 tablet (500 mg total) by mouth 2 (two) times daily. 180 tablet 4  . levothyroxine (  SYNTHROID, LEVOTHROID) 75 MCG tablet Take 75 mcg by mouth daily before breakfast.    . Multiple Vitamin (MULTIVITAMIN WITH MINERALS) TABS tablet Take 1 tablet by mouth daily.    Marland Kitchen nystatin ointment (MYCOSTATIN) Apply 1 application topically daily as needed (rash).   0  . ondansetron (ZOFRAN) 4 MG tablet Take 1 tablet (4 mg total) by mouth every 8 (eight) hours as needed for nausea or vomiting. 30 tablet 1  . pantoprazole (PROTONIX) 40 MG tablet Take 1 tablet (40 mg total) by mouth daily before breakfast. 30 tablet 1  . Powders (ANTI MONKEY BUTT) POWD Apply 1 application topically daily as needed (for moisture).    . traMADol (ULTRAM) 50 MG tablet Take 50 mg by mouth every 6 (six) hours as needed. pain  0  . triamcinolone  cream (KENALOG) 0.1 % Apply 1 application topically 2 (two) times daily as needed. itching 30 g 0  . ursodiol (ACTIGALL) 300 MG capsule Take 1 capsule (300 mg total) by mouth 3 (three) times daily. 90 capsule 4  . spironolactone (ALDACTONE) 100 MG tablet Take 1 tablet (100 mg total) by mouth 2 (two) times daily. (Patient not taking: Reported on 05/18/2015) 30 tablet 3  . torsemide (DEMADEX) 20 MG tablet Take 2 tablets (40 mg total) by mouth 2 (two) times daily. (Patient not taking: Reported on 05/18/2015) 60 tablet 1   No current facility-administered medications on file prior to visit.        Objective:   Physical Exam  Blood pressure 140/58, pulse 72, temperature 97.7 F (36.5 C), height  (1.575 m), weight 229 lb 12.8 oz (104.237 kg). Alert and oriented. Skin warm and dry. Oral mucosa is moist.   . Sclera icteric, conjunctivae is pink. Thyroid not enlarged. No cervical lymphadenopathy. Lungs clear. Heart regular rate and rhythm. Loud murmur heard. Abdomen is soft. Bowel sounds are positive. No hepatomegaly. No abdominal masses felt. No tenderness.  1+ edema to lower extremities.         Assessment & Plan:  Decompensated liver disease.Presently diuretics are on hold. H.pylori: pylera was stopped due to n,v. CMET tomorrow. OV in 4 weeks. Cmet in 2 weeks. Further recommendations once I have this back. May start her fluid pills back.

## 2015-05-18 NOTE — Patient Instructions (Addendum)
OV in  2 weeks with a CMET

## 2015-05-19 ENCOUNTER — Telehealth (INDEPENDENT_AMBULATORY_CARE_PROVIDER_SITE_OTHER): Payer: Self-pay | Admitting: *Deleted

## 2015-05-19 DIAGNOSIS — K7030 Alcoholic cirrhosis of liver without ascites: Secondary | ICD-10-CM

## 2015-05-19 NOTE — Telephone Encounter (Signed)
[facility].  You had asked her to let you know.  She doesn't know what she is to do with her diuretics.  You thinks you said to make sure hydrated for this visit.  Can you advise her on what to do?

## 2015-05-19 NOTE — Telephone Encounter (Signed)
Patient saw Cynthia Stephenson on 05-18-15. Terri restarted the diuretic at that time. Per Dr.Rehman we need to get the results from patient's lab work , this was done today and is in progress,before we can tell the patient dosages she can take.

## 2015-05-20 LAB — COMPREHENSIVE METABOLIC PANEL
ALBUMIN: 3.2 g/dL — AB (ref 3.5–5.2)
ALK PHOS: 105 U/L (ref 39–117)
ALT: 46 U/L — AB (ref 0–35)
AST: 75 U/L — ABNORMAL HIGH (ref 0–37)
BUN: 20 mg/dL (ref 6–23)
CALCIUM: 9.1 mg/dL (ref 8.4–10.5)
CO2: 19 mEq/L (ref 19–32)
Chloride: 100 mEq/L (ref 96–112)
Creat: 0.96 mg/dL (ref 0.50–1.10)
Glucose, Bld: 105 mg/dL — ABNORMAL HIGH (ref 70–99)
Potassium: 5.5 mEq/L — ABNORMAL HIGH (ref 3.5–5.3)
SODIUM: 128 meq/L — AB (ref 135–145)
TOTAL PROTEIN: 5.8 g/dL — AB (ref 6.0–8.3)
Total Bilirubin: 10 mg/dL — ABNORMAL HIGH (ref 0.2–1.2)

## 2015-05-20 NOTE — Telephone Encounter (Signed)
Cynthia Stephenson - patient 's labs are in that you ordered . She has called on yesterday,see question below and Dr.Rehman's response.  Copied is what he had told  her two days prior to her appointment with you.  Notes Recorded by Malissa HippoNajeeb U Rehman, MD on 05/16/2015 at 4:34 PM Metabolic 7 results reviewed with patient. Sodium is coming up. Serum potassium is just above normal and BUN is coming down. She weighed 221 pounds today. He is still quite weak but feels a little better than she did a few days ago. He is unable to sleep but does not have Ativan anymore. Patient advised not to take diaphoretic's for now. She has office visit in 2 days.

## 2015-05-21 ENCOUNTER — Telehealth (INDEPENDENT_AMBULATORY_CARE_PROVIDER_SITE_OTHER): Payer: Self-pay | Admitting: *Deleted

## 2015-05-21 DIAGNOSIS — K760 Fatty (change of) liver, not elsewhere classified: Secondary | ICD-10-CM

## 2015-05-21 NOTE — Telephone Encounter (Signed)
I have not started the diuretic. I will call her with the lab results.

## 2015-05-21 NOTE — Telephone Encounter (Signed)
.  Per Delrae Rend patient is to have lab work on 05-25-15.

## 2015-05-21 NOTE — Telephone Encounter (Signed)
I have talked with patient. She will have blood work Monday and hold diuretics.

## 2015-05-23 ENCOUNTER — Inpatient Hospital Stay (HOSPITAL_COMMUNITY)
Admission: EM | Admit: 2015-05-23 | Discharge: 2015-05-25 | DRG: 432 | Disposition: A | Discharge status: Home / Self Care | Payer: BLUE CROSS/BLUE SHIELD | Attending: Internal Medicine | Admitting: Internal Medicine

## 2015-05-23 ENCOUNTER — Emergency Department (HOSPITAL_COMMUNITY): Payer: BLUE CROSS/BLUE SHIELD

## 2015-05-23 ENCOUNTER — Encounter (HOSPITAL_COMMUNITY): Payer: Self-pay

## 2015-05-23 DIAGNOSIS — E8779 Other fluid overload: Secondary | ICD-10-CM | POA: Diagnosis not present

## 2015-05-23 DIAGNOSIS — K7469 Other cirrhosis of liver: Secondary | ICD-10-CM | POA: Diagnosis not present

## 2015-05-23 DIAGNOSIS — K760 Fatty (change of) liver, not elsewhere classified: Secondary | ICD-10-CM | POA: Diagnosis present

## 2015-05-23 DIAGNOSIS — K047 Periapical abscess without sinus: Secondary | ICD-10-CM | POA: Diagnosis present

## 2015-05-23 DIAGNOSIS — E039 Hypothyroidism, unspecified: Secondary | ICD-10-CM | POA: Diagnosis present

## 2015-05-23 DIAGNOSIS — K7460 Unspecified cirrhosis of liver: Principal | ICD-10-CM | POA: Diagnosis present

## 2015-05-23 DIAGNOSIS — I8510 Secondary esophageal varices without bleeding: Secondary | ICD-10-CM | POA: Diagnosis present

## 2015-05-23 DIAGNOSIS — R0600 Dyspnea, unspecified: Secondary | ICD-10-CM | POA: Diagnosis present

## 2015-05-23 DIAGNOSIS — D638 Anemia in other chronic diseases classified elsewhere: Secondary | ICD-10-CM | POA: Diagnosis not present

## 2015-05-23 DIAGNOSIS — K7200 Acute and subacute hepatic failure without coma: Secondary | ICD-10-CM | POA: Diagnosis present

## 2015-05-23 DIAGNOSIS — Z7682 Awaiting organ transplant status: Secondary | ICD-10-CM | POA: Diagnosis not present

## 2015-05-23 DIAGNOSIS — G40909 Epilepsy, unspecified, not intractable, without status epilepticus: Secondary | ICD-10-CM | POA: Diagnosis present

## 2015-05-23 DIAGNOSIS — R609 Edema, unspecified: Secondary | ICD-10-CM | POA: Diagnosis not present

## 2015-05-23 DIAGNOSIS — Z803 Family history of malignant neoplasm of breast: Secondary | ICD-10-CM

## 2015-05-23 DIAGNOSIS — Z833 Family history of diabetes mellitus: Secondary | ICD-10-CM | POA: Diagnosis not present

## 2015-05-23 DIAGNOSIS — D689 Coagulation defect, unspecified: Secondary | ICD-10-CM | POA: Diagnosis present

## 2015-05-23 DIAGNOSIS — K7290 Hepatic failure, unspecified without coma: Secondary | ICD-10-CM | POA: Diagnosis not present

## 2015-05-23 DIAGNOSIS — E8770 Fluid overload, unspecified: Secondary | ICD-10-CM | POA: Diagnosis present

## 2015-05-23 DIAGNOSIS — E871 Hypo-osmolality and hyponatremia: Secondary | ICD-10-CM | POA: Diagnosis present

## 2015-05-23 DIAGNOSIS — Z8041 Family history of malignant neoplasm of ovary: Secondary | ICD-10-CM | POA: Diagnosis not present

## 2015-05-23 DIAGNOSIS — Z8379 Family history of other diseases of the digestive system: Secondary | ICD-10-CM

## 2015-05-23 DIAGNOSIS — Z8249 Family history of ischemic heart disease and other diseases of the circulatory system: Secondary | ICD-10-CM | POA: Diagnosis not present

## 2015-05-23 DIAGNOSIS — R600 Localized edema: Secondary | ICD-10-CM | POA: Diagnosis present

## 2015-05-23 LAB — COMPREHENSIVE METABOLIC PANEL
ALK PHOS: 143 U/L — AB (ref 38–126)
ALT: 48 U/L (ref 14–54)
AST: 77 U/L — AB (ref 15–41)
Albumin: 2.8 g/dL — ABNORMAL LOW (ref 3.5–5.0)
Anion gap: 8 (ref 5–15)
BILIRUBIN TOTAL: 8.3 mg/dL — AB (ref 0.3–1.2)
BUN: 29 mg/dL — ABNORMAL HIGH (ref 6–20)
CO2: 21 mmol/L — ABNORMAL LOW (ref 22–32)
CREATININE: 0.79 mg/dL (ref 0.44–1.00)
Calcium: 8.5 mg/dL — ABNORMAL LOW (ref 8.9–10.3)
Chloride: 96 mmol/L — ABNORMAL LOW (ref 101–111)
GFR calc Af Amer: >60 mL/min (ref >60)
GFR calc non Af Amer: >60 mL/min (ref >60)
GLUCOSE: 95 mg/dL (ref 65–99)
POTASSIUM: 5 mmol/L (ref 3.5–5.1)
SODIUM: 125 mmol/L — AB (ref 135–145)
TOTAL PROTEIN: 6.1 g/dL — AB (ref 6.5–8.1)

## 2015-05-23 LAB — CBC WITH DIFFERENTIAL/PLATELET
BASOS ABS: 0.1 10*3/uL (ref 0.0–0.1)
Basophils Relative: 1 % (ref 0–1)
EOS ABS: 0.4 10*3/uL (ref 0.0–0.7)
Eosinophils Relative: 4 % (ref 0–5)
HEMATOCRIT: 29.7 % — AB (ref 36.0–46.0)
Hemoglobin: 10.6 g/dL — ABNORMAL LOW (ref 12.0–15.0)
Lymphocytes Relative: 21 % (ref 12–46)
Lymphs Abs: 2 10*3/uL (ref 0.7–4.0)
MCH: 34.5 pg — ABNORMAL HIGH (ref 26.0–34.0)
MCHC: 35.7 g/dL (ref 30.0–36.0)
MCV: 96.7 fL (ref 78.0–100.0)
MONO ABS: 1.2 10*3/uL — AB (ref 0.1–1.0)
Monocytes Relative: 13 % — ABNORMAL HIGH (ref 3–12)
Neutro Abs: 5.9 10*3/uL (ref 1.7–7.7)
Neutrophils Relative %: 61 % (ref 43–77)
Platelets: 133 10*3/uL — ABNORMAL LOW (ref 150–400)
RBC: 3.07 MIL/uL — ABNORMAL LOW (ref 3.87–5.11)
RDW: 16.8 % — ABNORMAL HIGH (ref 11.5–15.5)
WBC: 9.6 10*3/uL (ref 4.0–10.5)

## 2015-05-23 LAB — PROTIME-INR
INR: 1.58 — AB (ref 0.00–1.49)
Prothrombin Time: 18.9 seconds — ABNORMAL HIGH (ref 11.6–15.2)

## 2015-05-23 LAB — BRAIN NATRIURETIC PEPTIDE: B Natriuretic Peptide: 101 pg/mL — ABNORMAL HIGH (ref 0.0–100.0)

## 2015-05-23 LAB — TROPONIN I: TROPONIN I: 0.03 ng/mL (ref <0.031)

## 2015-05-23 MED ORDER — TRAMADOL HCL 50 MG PO TABS
50.0000 mg | ORAL_TABLET | Freq: Four times a day (QID) | ORAL | Status: DC | PRN
  Administered 2015-05-24: 50 mg via ORAL
  Filled 2015-05-23: qty 1

## 2015-05-23 MED ORDER — SODIUM CHLORIDE 0.9 % IJ SOLN: 3.0000 mL | INTRAMUSCULAR | Status: DC | PRN

## 2015-05-23 MED ORDER — URSODIOL 300 MG PO CAPS
300.0000 mg | ORAL_CAPSULE | Freq: Three times a day (TID) | ORAL | Status: DC
  Administered 2015-05-23 – 2015-05-25 (×6): 300 mg via ORAL
  Filled 2015-05-23 (×12): qty 1

## 2015-05-23 MED ORDER — LEVOTHYROXINE SODIUM 75 MCG PO TABS
75.0000 ug | ORAL_TABLET | Freq: Every day | ORAL | Status: DC
  Administered 2015-05-24 – 2015-05-25 (×2): 75 ug via ORAL
  Filled 2015-05-23 (×2): qty 1

## 2015-05-23 MED ORDER — LACTULOSE 10 GM/15ML PO SOLN
20.0000 g | Freq: Three times a day (TID) | ORAL | Status: DC
  Administered 2015-05-23 – 2015-05-25 (×2): 20 g via ORAL
  Filled 2015-05-23 (×5): qty 30

## 2015-05-23 MED ORDER — FUROSEMIDE 10 MG/ML IJ SOLN
40.0000 mg | Freq: Two times a day (BID) | INTRAMUSCULAR | Status: DC
  Administered 2015-05-23 – 2015-05-25 (×4): 40 mg via INTRAVENOUS
  Filled 2015-05-23 (×4): qty 4

## 2015-05-23 MED ORDER — ALBUMIN HUMAN 25 % IV SOLN
50.0000 g | Freq: Once | INTRAVENOUS | Status: AC
  Administered 2015-05-23: 50 g via INTRAVENOUS
  Filled 2015-05-23: qty 200

## 2015-05-23 MED ORDER — LORAZEPAM 0.5 MG PO TABS
0.5000 mg | ORAL_TABLET | Freq: Four times a day (QID) | ORAL | Status: DC | PRN
  Administered 2015-05-24 (×2): 0.5 mg via ORAL
  Filled 2015-05-23 (×2): qty 1

## 2015-05-23 MED ORDER — SODIUM CHLORIDE 0.9 % IJ SOLN
3.0000 mL | Freq: Two times a day (BID) | INTRAMUSCULAR | Status: DC
  Administered 2015-05-23 – 2015-05-25 (×4): 3 mL via INTRAVENOUS

## 2015-05-23 MED ORDER — LEVETIRACETAM 500 MG PO TABS
500.0000 mg | ORAL_TABLET | Freq: Two times a day (BID) | ORAL | Status: DC
  Administered 2015-05-23 – 2015-05-25 (×5): 500 mg via ORAL
  Filled 2015-05-23 (×5): qty 1

## 2015-05-23 MED ORDER — PANTOPRAZOLE SODIUM 40 MG PO TBEC
40.0000 mg | DELAYED_RELEASE_TABLET | Freq: Every day | ORAL | Status: DC
  Administered 2015-05-24 – 2015-05-25 (×2): 40 mg via ORAL
  Filled 2015-05-23 (×2): qty 1

## 2015-05-23 MED ORDER — SODIUM CHLORIDE 0.9 % IJ SOLN
3.0000 mL | Freq: Two times a day (BID) | INTRAMUSCULAR | Status: DC
  Administered 2015-05-23 – 2015-05-24 (×2): 3 mL via INTRAVENOUS

## 2015-05-23 MED ORDER — ONDANSETRON HCL 4 MG PO TABS
4.0000 mg | ORAL_TABLET | Freq: Three times a day (TID) | ORAL | Status: DC | PRN
Start: 2015-05-23 — End: 2015-05-25

## 2015-05-23 MED ORDER — SODIUM CHLORIDE 0.9 % IV SOLN: 250.0000 mL | INTRAVENOUS | Status: DC | PRN

## 2015-05-23 NOTE — H&P (Signed)
History and Physical  Cynthia Stephenson ZOX:[id number] DOB: 1966/09/18 DOA: 05/23/2015  Referring physician: Dr. Shelly Coss in ED PCP: [name], MD   Chief Complaint: swelling  HPI:  49 year old woman with NAFLD with cirrhosis currently following a [facility] undergoing preparation to become a liver transplant candidate and who presents with increasing shortness of breath, fatigue and dramatic weight gain over the last 2 weeks.initial evaluation revealed volume overload with decompensated cirrhosis.  She is followed closely by Dr. Karilyn Cota she was recently hospitalized and discharged 5/22 when she was treated for volume overload with 20 pound weight loss.she was discharged on diurectics.  Weight drop substantially and she developed symptoms suggestive of dehydration so her diuretics were put on hold. She subsequently developed increasing lower extremity edema, substantial weight gain, dyspnea on exertion and presented to the hospital for further evaluation.  She has had no chest pain in review systems is fairly unremarkable.  In the emergency department afebrile, vital signs stable. No hypoxia.chemistry panel notable for chronic hyponatremia, stable; chronic elevation of LFTs, stable; normal BNP and troponin;stable anemia and thrombocytopenia. Chest x-ray no acute disease. EKG sinus rhythm, no acute changes.  Review of Systems:  Negative for fever,she does report some blurred vision and sore tongue,   Negative for rash, new muscle aches, chest pain,  dysuria, bleeding, n/v/abdominal pain.her appetite has been fairly good.  Past Medical History  Diagnosis Date  . Thyroid disease   . Hypothyroidism   . Seizures   . Cirrhosis   . Thrombocytopenia     Past Surgical History  Procedure Laterality Date  . Abdominal hysterectomy    . Abdominal hysterectomy    . Liver biopsy    . Esophagogastroduodenoscopy N/A 05/01/2015    Procedure: ESOPHAGOGASTRODUODENOSCOPY (EGD);  Surgeon: Malissa Hippo, MD;   Location: [facility];  Service: Endoscopy;  Laterality: N/A;    Social History:  reports that she has never smoked. She has never used smokeless tobacco. She reports that she does not drink alcohol or use illicit drugs. lives with their spouse Self-[employer]  No Known Allergies  Family History  Problem Relation Age of Onset  . Breast cancer Mother   . Ovarian cancer Mother   . Thyroid cancer Mother   . Diabetes Father   . Heart disease Father   . Skin cancer Father   . Celiac disease Paternal Aunt      Prior to Admission medications   Medication Sig Start Date End Date Taking? Authorizing Provider  lactulose (CHRONULAC) 10 GM/15ML solution Take 30 mLs (20 g total) by mouth 3 (three) times daily. 05/04/15 06/03/15 Yes Lesle Chris Black, NP  levETIRAcetam (KEPPRA) 500 MG tablet Take 1 tablet (500 mg total) by mouth 2 (two) times daily. 02/20/15  Yes Suanne Marker, MD  levothyroxine (SYNTHROID, LEVOTHROID) 75 MCG tablet Take 75 mcg by mouth daily before breakfast.   Yes Historical Provider, MD  LORazepam (ATIVAN) 0.5 MG tablet Take 1 tablet (0.5 mg total) by mouth as needed for anxiety. 05/18/15  Yes Len Blalock, NP  Multiple Vitamin (MULTIVITAMIN WITH MINERALS) TABS tablet Take 1 tablet by mouth daily.   Yes Historical Provider, MD  ondansetron (ZOFRAN) 4 MG tablet Take 1 tablet (4 mg total) by mouth every 8 (eight) hours as needed for nausea or vomiting. 05/14/15  Yes Len Blalock, NP  pantoprazole (PROTONIX) 40 MG tablet Take 1 tablet (40 mg total) by mouth daily before breakfast. 05/04/15  Yes Gwenyth Bender, NP  traMADol (  ULTRAM) 50 MG tablet Take 50 mg by mouth every 6 (six) hours as needed. pain 04/08/15  Yes Historical Provider, MD  triamcinolone cream (KENALOG) 0.1 % Apply 1 application topically 2 (two) times daily as needed. itching 05/04/15  Yes Erick Blinks, MD  ursodiol (ACTIGALL) 300 MG capsule Take 1 capsule (300 mg total) by mouth 3 (three) times daily. 04/03/15  Yes Len Blalock, NP  bismuth-metronidazole-tetracycline (PYLERA) [phone] MG per capsule Take 3 capsules by mouth 4 (four) times daily -  before meals and at bedtime. Patient not taking: Reported on 05/23/2015 05/05/15   Malissa Hippo, MD  spironolactone (ALDACTONE) 100 MG tablet Take 1 tablet (100 mg total) by mouth 2 (two) times daily. Patient not taking: Reported on 05/18/2015 03/23/15   Gwenyth Bender, NP  torsemide (DEMADEX) 20 MG tablet Take 2 tablets (40 mg total) by mouth 2 (two) times daily. Patient not taking: Reported on 05/18/2015 05/04/15   Gwenyth Bender, NP   Physical Exam: Filed Vitals:   05/23/15 0800 05/23/15 0830 05/23/15 0900 05/23/15 0930  BP: 110/44 104/55 112/45 112/46  Pulse: 89 84 93 84  Temp:      TempSrc:      Resp: Height:      Weight:      SpO2: 100% 97% 96% 94%    General:  Appears calm and mildly uncomfortable but not toxic  Eyes: PERRL, normal lids, irises  ENT: grossly normal hearing, lips  Neck: no LAD, masses or thyromegaly Cardiovascular: RRR, no m/r/g. 2+ bilateral lower extremity edema.Marland Kitchen Respiratory: CTA bilaterally, no w/r/r. Normal respiratory effort. Abdomen: soft, obese, soft, nontender Skin: no rash or induration noted Musculoskeletal: grossly normal tone BUE/BLE Psychiatric: grossly normal mood and affect, speech fluent and appropriate Neurologic: grossly non-focal.  Wt Readings from Last 3 Encounters:  05/23/15 108.863 kg (240 lb)  05/18/15 104.237 kg (229 lb 12.8 oz)  05/04/15 104.01 kg (229 lb 4.8 oz)    Labs on Admission:  Basic Metabolic Panel:  Recent Labs Lab 05/18/15 0947 05/23/15 0656  NA 128* 125*  K 5.5* 5.0  CL 100 96*  CO2 19 21*  GLUCOSE 105* 95  BUN 20 29*  CREATININE 0.96 0.79  CALCIUM 9.1 8.5*    Liver Function Tests:  Recent Labs Lab 05/18/15 0947 05/23/15 0656  AST 75* 77*  ALT 46* 48  ALKPHOS 105 143*  BILITOT 10.0* 8.3*  PROT 5.8* 6.1*  ALBUMIN 3.2* 2.8*    CBC:  Recent Labs Lab  05/23/15 0656  WBC 9.6  NEUTROABS 5.9  HGB 10.6*  HCT 29.7*  MCV 96.7  PLT 133*    Cardiac Enzymes:  Recent Labs Lab 05/23/15 0656  TROPONINI 0.03    Radiological Exams on Admission: Dg Chest 2 View  05/23/2015   CLINICAL DATA:  Worsening dyspnea over the past 2 days.  EXAM: CHEST  2 VIEW  COMPARISON:  03/19/2015  FINDINGS: The heart size and mediastinal contours are within normal limits. Both lungs are clear. The visualized skeletal structures are unremarkable.  IMPRESSION: No active cardiopulmonary disease.   Electronically Signed   By: Ellery Plunk M.D.   On: 05/23/2015 06:13    Principal Problem:   Decompensation of cirrhosis of liver Active Problems:   Hypothyroidism   Seizure disorder   Dyspnea   Cirrhosis   Peripheral edema   Assessment/Plan 1. Decompensated NAFLD with cirrhosis with volume overload,associated hyponatremia, hyperbilirubinemia, coagulopathy, esophageal varices. Her next visit  to [place] is in July. She is being evaluated for a liver transplant.  2. Volume overload, weight 240 pounds,secondary to decompensated liver disease.2-D echocardiogram April 2016 was unremarkable.troponin and BNP unremarkable. 3. Stable hyponatremia secondary to underlying liver disease. Asymptomatic. 4. Chronic anemia of disease. Stable. 5. Hypothyroidism. TSH was highMarch 2016. 6. Seizure disorder. Continue Keppra.   Plan admission to telemetry for volume overload with decompensated liver failure.  Aggressive diuresis, daily weights, strict I/O.  CMP in the morning.  Check TSH.  I discussed the results of the testing thus far, clinical impressions and treatment plan with the patient, son, husband. We discussed diabetic therapy with which they agreed. She does not appear to have significant ascites.according to the review of the chart per GI she responds well to albumen and Lasix. We will proceed with similar dosing.  Code Status: full code  DVT  prophylaxis: SCDs Family Communication:  Disposition Plan/Anticipated LOS: admit 3 days  Time spent: 60 minutes  Brendia Sacks, MD  [facility] Pager [phone] 05/23/2015, 11:08 AM

## 2015-05-23 NOTE — ED Provider Notes (Signed)
CSN: [id number]     Arrival date & time 05/23/15  4098 History   First MD Initiated Contact with Patient 05/23/15 [phone]     Chief Complaint  Patient presents with  . Shortness of Breath     (Consider location/radiation/quality/duration/timing/severity/associated sxs/prior Treatment) HPI Comments: Patient is a 49 year old female with history of cirrhosis. She presents for evaluation of shortness of breath which is worsened over the past 2 days. Her symptoms are worse with exertion and lying flat and relieved with rest. She denies any fever or productive cough. She denies any chest pain. She reports she has been in the hospital on several occasions for fluid retention. She does report some swelling in her ankles and possibly her abdomen. She denies any abdominal pain.  Patient is a 49 y.o. female presenting with shortness of breath. The history is provided by the patient.  Shortness of Breath Severity:  Moderate Onset quality:  Gradual Duration:  2 days Timing:  Constant Progression:  Worsening Chronicity:  Recurrent Context: activity   Relieved by:  Nothing Worsened by:  Activity and exertion (Lying flat) Ineffective treatments:  None tried Associated symptoms: no abdominal pain, no chest pain and no fever     Past Medical History  Diagnosis Date  . Thyroid disease   . Hypothyroidism   . Seizures   . Cirrhosis   . Thrombocytopenia    Past Surgical History  Procedure Laterality Date  . Abdominal hysterectomy    . Abdominal hysterectomy    . Liver biopsy    . Esophagogastroduodenoscopy N/A 05/01/2015    Procedure: ESOPHAGOGASTRODUODENOSCOPY (EGD);  Surgeon: Malissa Hippo, MD;  Location: [facility];  Service: Endoscopy;  Laterality: N/A;   Family History  Problem Relation Age of Onset  . Breast cancer Mother   . Ovarian cancer Mother   . Thyroid cancer Mother   . Diabetes Father   . Heart disease Father   . Skin cancer Father   . Celiac disease Paternal Aunt     History  Substance Use Topics  . Smoking status: Never Smoker   . Smokeless tobacco: Never Used  . Alcohol Use: No   OB History    No data available     Review of Systems  Constitutional: Negative for fever.  Respiratory: Positive for shortness of breath.   Cardiovascular: Negative for chest pain.  Gastrointestinal: Negative for abdominal pain.  All other systems reviewed and are negative.     Allergies  Review of patient's allergies indicates no known allergies.  Home Medications   Prior to Admission medications   Medication Sig Start Date End Date Taking? Authorizing Provider  lactulose (CHRONULAC) 10 GM/15ML solution Take 30 mLs (20 g total) by mouth 3 (three) times daily. 05/04/15 06/03/15 Yes Lesle Chris Black, NP  levETIRAcetam (KEPPRA) 500 MG tablet Take 1 tablet (500 mg total) by mouth 2 (two) times daily. 02/20/15  Yes Suanne Marker, MD  levothyroxine (SYNTHROID, LEVOTHROID) 75 MCG tablet Take 75 mcg by mouth daily before breakfast.   Yes Historical Provider, MD  LORazepam (ATIVAN) 0.5 MG tablet Take 1 tablet (0.5 mg total) by mouth as needed for anxiety. 05/18/15  Yes Len Blalock, NP  Multiple Vitamin (MULTIVITAMIN WITH MINERALS) TABS tablet Take 1 tablet by mouth daily.   Yes Historical Provider, MD  ondansetron (ZOFRAN) 4 MG tablet Take 1 tablet (4 mg total) by mouth every 8 (eight) hours as needed for nausea or vomiting. 05/14/15  Yes Len Blalock, NP  pantoprazole (PROTONIX) 40 MG tablet Take 1 tablet (40 mg total) by mouth daily before breakfast. 05/04/15  Yes Gwenyth Bender, NP  traMADol (ULTRAM) 50 MG tablet Take 50 mg by mouth every 6 (six) hours as needed. pain 04/08/15  Yes Historical Provider, MD  ursodiol (ACTIGALL) 300 MG capsule Take 1 capsule (300 mg total) by mouth 3 (three) times daily. 04/03/15  Yes Len Blalock, NP  bismuth-metronidazole-tetracycline (PYLERA) [phone] MG per capsule Take 3 capsules by mouth 4 (four) times daily -  before meals and  at bedtime. 05/05/15   Malissa Hippo, MD  nystatin ointment (MYCOSTATIN) Apply 1 application topically daily as needed (rash).  04/22/15   Historical Provider, MD  Powders (ANTI MONKEY BUTT) POWD Apply 1 application topically daily as needed (for moisture).    Historical Provider, MD  spironolactone (ALDACTONE) 100 MG tablet Take 1 tablet (100 mg total) by mouth 2 (two) times daily. Patient not taking: Reported on 05/18/2015 03/23/15   Gwenyth Bender, NP  torsemide (DEMADEX) 20 MG tablet Take 2 tablets (40 mg total) by mouth 2 (two) times daily. Patient not taking: Reported on 05/18/2015 05/04/15   Gwenyth Bender, NP  triamcinolone cream (KENALOG) 0.1 % Apply 1 application topically 2 (two) times daily as needed. itching 05/04/15   Erick Blinks, MD   There were no vitals taken for this visit. Physical Exam  Constitutional: She is oriented to person, place, and time. She appears well-developed and well-nourished. No distress.  HENT:  Head: Normocephalic and atraumatic.  Eyes: EOM are normal. Pupils are equal, round, and reactive to light. Scleral icterus is present.  Neck: Normal range of motion. Neck supple.  Cardiovascular: Normal rate and regular rhythm.  Exam reveals no gallop and no friction rub.   No murmur heard. Pulmonary/Chest: Effort normal and breath sounds normal. No respiratory distress. She has no wheezes.  Abdominal: Soft. Bowel sounds are normal. She exhibits no distension. There is no tenderness.  Musculoskeletal: Normal range of motion. She exhibits edema.  There is 2+ pitting edema in the bilateral lower extremities.  Neurological: She is alert and oriented to person, place, and time.  Skin: Skin is warm and dry. She is not diaphoretic.  There is jaundice present  Nursing note and vitals reviewed.   ED Course  Procedures (including critical care time) Labs Review Labs Reviewed  COMPREHENSIVE METABOLIC PANEL  CBC WITH DIFFERENTIAL/PLATELET  PROTIME-INR  BRAIN NATRIURETIC  PEPTIDE  TROPONIN I    Imaging Review No results found.   EKG Interpretation   Date/Time:  Saturday May 23 2015 05:40:31 EDT Ventricular Rate:  90 PR Interval:  148 QRS Duration: 87 QT Interval:  369 QTC Calculation: 451 R Axis:   75 Text Interpretation:  Sinus rhythm Confirmed by Josephanthony Tindel  MD, Ras Kollman ([id number])  on 05/23/2015 5:47:35 AM      MDM   Final diagnoses:  None    Patient presents with complaints of shortness of breath and increased swelling. She has a history of cirrhosis and is on the transplant list. She has been admitted in the past and diuresed. Laboratory studies at this point are pending as the initial labs which were drawn were hemolyzed. The redraw has not yet been performed. Patient's care will be signed out to Dr. Adriana Simas at shift change. He will obtain the results of the laboratory studies and determine the final disposition.    Geoffery Lyons, MD 05/25/15 [phone]

## 2015-05-23 NOTE — ED Notes (Signed)
Pt states she is waiting for a liver transplant, states she has had worsening sob over the past couple of days.  Pt denies pain

## 2015-05-23 NOTE — ED Notes (Signed)
Pt up to restroom with no assistance. 

## 2015-05-23 NOTE — ED Provider Notes (Signed)
Admit to general medicine.  Donnetta Hutching, MD 05/23/15 1212

## 2015-05-24 DIAGNOSIS — K7469 Other cirrhosis of liver: Secondary | ICD-10-CM

## 2015-05-24 DIAGNOSIS — E8779 Other fluid overload: Secondary | ICD-10-CM

## 2015-05-24 DIAGNOSIS — E871 Hypo-osmolality and hyponatremia: Secondary | ICD-10-CM | POA: Diagnosis present

## 2015-05-24 DIAGNOSIS — D638 Anemia in other chronic diseases classified elsewhere: Secondary | ICD-10-CM | POA: Diagnosis present

## 2015-05-24 DIAGNOSIS — E8770 Fluid overload, unspecified: Secondary | ICD-10-CM | POA: Diagnosis present

## 2015-05-24 LAB — COMPREHENSIVE METABOLIC PANEL
ALK PHOS: 134 U/L — AB (ref 38–126)
ALT: 46 U/L (ref 14–54)
ANION GAP: 8 (ref 5–15)
AST: 74 U/L — AB (ref 15–41)
Albumin: 3.3 g/dL — ABNORMAL LOW (ref 3.5–5.0)
BUN: 25 mg/dL — AB (ref 6–20)
CHLORIDE: 95 mmol/L — AB (ref 101–111)
CO2: 24 mmol/L (ref 22–32)
Calcium: 8.6 mg/dL — ABNORMAL LOW (ref 8.9–10.3)
Creatinine, Ser: 0.68 mg/dL (ref 0.44–1.00)
GFR calc non Af Amer: >60 mL/min (ref >60)
Glucose, Bld: 100 mg/dL — ABNORMAL HIGH (ref 65–99)
POTASSIUM: 4.6 mmol/L (ref 3.5–5.1)
SODIUM: 127 mmol/L — AB (ref 135–145)
Total Bilirubin: 8.5 mg/dL — ABNORMAL HIGH (ref 0.3–1.2)
Total Protein: 6.5 g/dL (ref 6.5–8.1)

## 2015-05-24 LAB — CBC
HCT: 29.6 % — ABNORMAL LOW (ref 36.0–46.0)
HEMOGLOBIN: 10.2 g/dL — AB (ref 12.0–15.0)
MCH: 33.9 pg (ref 26.0–34.0)
MCHC: 34.5 g/dL (ref 30.0–36.0)
MCV: 98.3 fL (ref 78.0–100.0)
Platelets: 116 10*3/uL — ABNORMAL LOW (ref 150–400)
RBC: 3.01 MIL/uL — AB (ref 3.87–5.11)
RDW: 17 % — AB (ref 11.5–15.5)
WBC: 7.3 10*3/uL (ref 4.0–10.5)

## 2015-05-24 LAB — TSH: TSH: 2.417 u[IU]/mL (ref 0.350–4.500)

## 2015-05-24 LAB — PROTIME-INR
INR: 1.64 — ABNORMAL HIGH (ref 0.00–1.49)
Prothrombin Time: 19.4 seconds — ABNORMAL HIGH (ref 11.6–15.2)

## 2015-05-24 MED ORDER — ALBUMIN HUMAN 25 % IV SOLN
50.0000 g | Freq: Once | INTRAVENOUS | Status: AC
  Administered 2015-05-24: 50 g via INTRAVENOUS
  Filled 2015-05-24: qty 200

## 2015-05-24 MED ORDER — ACETAMINOPHEN 325 MG PO TABS
325.0000 mg | ORAL_TABLET | Freq: Four times a day (QID) | ORAL | Status: DC | PRN
  Administered 2015-05-24 – 2015-05-25 (×2): 325 mg via ORAL
  Filled 2015-05-24 (×2): qty 1

## 2015-05-24 MED ORDER — OXYCODONE HCL 5 MG PO TABS
5.0000 mg | ORAL_TABLET | Freq: Four times a day (QID) | ORAL | Status: DC | PRN
  Administered 2015-05-24: 5 mg via ORAL
  Filled 2015-05-24: qty 1

## 2015-05-24 MED ORDER — AMOXICILLIN 250 MG PO CAPS
500.0000 mg | ORAL_CAPSULE | Freq: Two times a day (BID) | ORAL | Status: DC
  Administered 2015-05-24 – 2015-05-25 (×3): 500 mg via ORAL
  Filled 2015-05-24 (×3): qty 2

## 2015-05-24 MED ORDER — NYSTATIN 100000 UNIT/ML MT SUSP
5.0000 mL | Freq: Four times a day (QID) | OROMUCOSAL | Status: DC
  Administered 2015-05-24 – 2015-05-25 (×5): 500000 [IU] via ORAL
  Filled 2015-05-24 (×5): qty 5

## 2015-05-24 NOTE — Progress Notes (Signed)
PROGRESS NOTE  Cynthia Stephenson ZOX:[id number] DOB: 1966/03/22 DOA: 05/23/2015 PCP: [name], MD  Summary: 49 year old woman with NAFLD with cirrhosis currently following a [facility] undergoing preparation to become a liver transplant candidate and who presents with increasing shortness of breath, fatigue and dramatic weight gain over the last 2 weeks.initial evaluation revealed volume overload with decompensated cirrhosis.  Assessment/Plan: 1. Decompensated NAFLD with cirrhosis, volume overload, hyponatremia, hyperbilirubinemia, coagulopathy, esophageal varices. Undergoing evaluation for possible liver transplant at [facility]. 2. Volume overload secondary to decompensated liver disease. Minimal improvement. Plan as below. 2-D echocardiogram April 2016 unremarkable. BNP and troponin were unremarkable on admission. 3. Stable hyponatremia secondary to underlying liver disease. 4. Anemia of chronic disease. Stable. 5. Hypothyroidism. TSH within normal limits. 6. Seizure disorder. Keppra.   Continue IV Lasix. Repeat one dose of albumen per day as this regimen has worked well for her in the past.  Strict I/O, daily weights.  BMP in the morning.  Code Status: full code DVT prophylaxis: SCDs Family Communication:  Disposition Plan: home  Brendia Sacks, MD  [facility]  Pager [phone] If 7PM-7AM, please contact night-coverage at [email or web address], password [facility] 05/24/2015, 11:03 AM  LOS: 1 day   Consultants:    Procedures:    Antibiotics:    HPI/Subjective: Has a headache. She is feeling a little bit better. Some tongue soreness.  Objective: Filed Vitals:   05/23/15 1300 05/23/15 1408 05/23/15 2209 05/24/15 0556  BP: 108/42 120/44 128/57 117/47  Pulse: 83 86 94 85  Temp:  97.9 F (36.6 C) 98.3 F (36.8 C) 97.4 F (36.3 C)  TempSrc:  Oral Oral Oral  Resp: 24 22 22 22   Height:  5\' 2"  (1.575 m)    Weight:  111.1 kg (244 lb 14.9 oz)  107.4 kg (236 lb 12.4 oz)  SpO2: 100% 100%  100% 100%    Intake/Output Summary (Last 24 hours) at 05/24/15 1103 Last data filed at 05/24/15 0918  Gross per 24 hour  Intake      3 ml  Output   1900 ml  Net  -1897 ml     Filed Weights   05/23/15 0546 05/23/15 1408 05/24/15 0556  Weight: 108.863 kg (240 lb) 111.1 kg (244 lb 14.9 oz) 107.4 kg (236 lb 12.4 oz)    Exam:     Afebrile, VSS General: Appears calm and mildly uncomfortable Cardiovascular: RRR, no m/r/g. 2-3+ LE edema. Telemetry: SR, no arrhythmias  Respiratory: CTA bilaterally, no w/r/r. Normal respiratory effort. Psychiatric: grossly normal mood and affect, speech fluent and appropriate  New data reviewed:  UOP 1500  Weights unreliable  Sodium up to 127, BMP stable renal function  T bili stable at 8.5  Plts stabe 116, Hgb stable 10.2  INR 1.64 not on anticoagulation  TSH 2.417  Scheduled Meds: . furosemide  40 mg Intravenous Q12H  . lactulose  20 g Oral TID  . levETIRAcetam  500 mg Oral BID  . levothyroxine  75 mcg Oral QAC breakfast  . nystatin  5 mL Oral QID  . pantoprazole  40 mg Oral QAC breakfast  . sodium chloride  3 mL Intravenous Q12H  . sodium chloride  3 mL Intravenous Q12H  . ursodiol  300 mg Oral TID   Continuous Infusions:   Principal Problem:   Decompensation of cirrhosis of liver Active Problems:   Hypothyroidism   Seizure disorder   Dyspnea   Cirrhosis   Peripheral edema   Volume overload   Hyponatremia   Anemia of  chronic disease   Time spent 20 minutes

## 2015-05-24 NOTE — Consult Note (Addendum)
Referring Provider: No ref. provider found Primary Care Physician:  [name], MD Primary Gastroenterologist:  [name]  Reason for Consultation:  CIRRHOSIS   Impression: ADMITTED WITH FLUID OVERLOAD. HEART CATH PENDING TUES AT [facility]. PARTIALLY COMPENSATED LIVER DISEASE-MELD 28, CHILD PUGH B(9). NEEDS AMOXICILLIN TO COMPLETE THERAPY FOR TOOTH ABSCESS.  Plan: 1. AMOXICILLIN 500 MG BID FOR 4 DOSES TO COMPLETE THERAPY FOR DENTAL ABSCESS 2. CONTINUE LACTUOLSE/ACTIGALL. 3. CONTACT [facility]PAT[facility] JUN 13 4. ANTICIPATE D/C JUN 13 THEN HEART CATH JUN 14 AT [facility]   HPI:  PT ADMITTED WITH SOB. GETTING ALBUMIN AND LASIX. FEELS BETTER.  PT DENIES FEVER, CHILLS, HEMATOCHEZIA, , nausea, vomiting, melena, diarrhea, CHEST PAIN, CHANGE IN BOWEL IN HABITS, constipation, abdominal pain, problems swallowing, OR heartburn or indigestion.  Past Medical History  Diagnosis Date  . Hypothyroidism   . Seizures   . Cirrhosis   . Thrombocytopenia     Past Surgical History  Procedure Laterality Date  . Abdominal hysterectomy    . Liver biopsy    . Esophagogastroduodenoscopy N/A 05/01/2015    Procedure: ESOPHAGOGASTRODUODENOSCOPY (EGD);  Surgeon: Malissa Hippo, MD;  Location: [facility];  Service: Endoscopy;  Laterality: N/A;  . Cesearin section      Prior to Admission medications   Medication Sig Start Date End Date Taking? Authorizing Provider  lactulose (CHRONULAC) 10 GM/15ML solution Take 30 mLs (20 g total) by mouth 3 (three) times daily. 05/04/15 06/03/15 Yes Lesle Chris Black, NP  levETIRAcetam (KEPPRA) 500 MG tablet Take 1 tablet (500 mg total) by mouth 2 (two) times daily. 02/20/15  Yes Suanne Marker, MD  levothyroxine (SYNTHROID, LEVOTHROID) 75 MCG tablet Take 75 mcg by mouth daily before breakfast.   Yes Historical Provider, MD  LORazepam (ATIVAN) 0.5 MG tablet Take 1 tablet (0.5 mg total) by mouth as needed for anxiety. 05/18/15  Yes Len Blalock, NP  Multiple Vitamin  (MULTIVITAMIN WITH MINERALS) TABS tablet Take 1 tablet by mouth daily.   Yes Historical Provider, MD  ondansetron (ZOFRAN) 4 MG tablet Take 1 tablet (4 mg total) by mouth every 8 (eight) hours as needed for nausea or vomiting. 05/14/15  Yes Len Blalock, NP  pantoprazole (PROTONIX) 40 MG tablet Take 1 tablet (40 mg total) by mouth daily before breakfast. 05/04/15  Yes Lesle Chris Black, NP  traMADol (ULTRAM) 50 MG tablet Take 50 mg by mouth every 6 (six) hours as needed. pain 04/08/15  Yes Historical Provider, MD  triamcinolone cream (KENALOG) 0.1 % Apply 1 application topically 2 (two) times daily as needed. itching 05/04/15  Yes Erick Blinks, MD  ursodiol (ACTIGALL) 300 MG capsule Take 1 capsule (300 mg total) by mouth 3 (three) times daily. 04/03/15  Yes Len Blalock, NP    Current Facility-Administered Medications  Medication Dose Route Frequency Provider Last Rate Last Dose  . 0.9 %  sodium chloride infusion  250 mL Intravenous PRN Standley Brooking, MD      . acetaminophen (TYLENOL) tablet 325 mg  325 mg Oral Q6H PRN Standley Brooking, MD      . albumin human 25 % solution 50 g  50 g Intravenous Once Standley Brooking, MD      . furosemide (LASIX) injection 40 mg  40 mg Intravenous Q12H Standley Brooking, MD   40 mg at 05/24/15 0601  . lactulose (CHRONULAC) 10 GM/15ML solution 20 g  20 g Oral TID Standley Brooking, MD   20 g at 05/23/15 1531  .  levETIRAcetam (KEPPRA) tablet 500 mg  500 mg Oral BID Standley Brooking, MD   500 mg at 05/24/15 0903  . levothyroxine (SYNTHROID, LEVOTHROID) tablet 75 mcg  75 mcg Oral QAC breakfast Standley Brooking, MD   75 mcg at 05/24/15 0902  . LORazepam (ATIVAN) tablet 0.5 mg  0.5 mg Oral Q6H PRN Standley Brooking, MD   0.5 mg at 05/24/15 0601  . nystatin (MYCOSTATIN) 100000 UNIT/ML suspension 500,000 Units  5 mL Oral QID Standley Brooking, MD   500,000 Units at 05/24/15 1341  . ondansetron (ZOFRAN) tablet 4 mg  4 mg Oral Q8H PRN Standley Brooking, MD      .  pantoprazole (PROTONIX) EC tablet 40 mg  40 mg Oral QAC breakfast Standley Brooking, MD   40 mg at 05/24/15 0903  . sodium chloride 0.9 % injection 3 mL  3 mL Intravenous Q12H Standley Brooking, MD   3 mL at 05/23/15 1533  . sodium chloride 0.9 % injection 3 mL  3 mL Intravenous Q12H Standley Brooking, MD   3 mL at 05/24/15 0903  . sodium chloride 0.9 % injection 3 mL  3 mL Intravenous PRN Standley Brooking, MD      . ursodiol (ACTIGALL) capsule 300 mg  300 mg Oral TID Standley Brooking, MD   300 mg at 05/24/15 0916    Allergies as of 05/23/2015  . (No Known Allergies)    Family History  Problem Relation Age of Onset  . Breast cancer Mother   . Ovarian cancer Mother   . Thyroid cancer Mother   . Diabetes Father   . Heart disease Father   . Skin cancer Father   . Celiac disease Paternal Aunt      History   Social History  . Marital Status: Married    Spouse Name: Loraine Leriche  . Number of Children: 3  . Years of Education: 12   Occupational History  .  Other    [employer]   Social History Main Topics  . Smoking status: Never Smoker   . Smokeless tobacco: Never Used  . Alcohol Use: No  . Drug Use: No  . Sexual Activity: Not on file   Other Topics Concern  . Not on file   Social History Narrative   Lives at home with Husband    caffeine use: yes    Review of Systems: PER HPI OTHERWISE ALL SYSTEMS ARE NEGATIVE.   Vitals: Blood pressure 117/47, pulse 85, temperature 97.4 F (36.3 C), temperature source Oral, resp. rate 22, height 5\' 2"  (1.575 m), weight 236 lb 12.4 oz (107.4 kg), SpO2 100 %.  Physical Exam: General:   Alert,  Well-developed, well-nourished, pleasant and cooperative in NAD,  OBESE Head:  Normocephalic and atraumatic. Eyes:  Sclera clear, icterus.   Conjunctiva pink. Neck:  Supple; no masses. Lungs:  Clear throughout to auscultation.   No wheezes. No acute distress. Heart:  Regular rate and rhythm; no  murmurs APPRECIATED. Abdomen:  Soft, nontender and nondistended, OBESE,Normal bowel sounds, without guarding, and without rebound.   Msk:  Symmetrical without gross deformities. Normal posture. Extremities:  With edema. Neurologic:  Alert and  oriented x4;  grossly normal neurologically. Cervical Nodes:  No significant cervical adenopathy. Psych:  Alert and cooperative. Normal mood and FLAT Affect. Lab Results:  Recent Labs  05/23/15 0656 05/24/15 0605  WBC 9.6 7.3  HGB 10.6* 10.2*  HCT 29.7*  29.6*  PLT 133* 116*   BMET  Recent Labs  05/23/15 0656 05/24/15 0605  NA 125* 127*  K 5.0 4.6  CL 96* 95*  CO2 21* 24  GLUCOSE 95 100*  BUN 29* 25*  CREATININE 0.79 0.68  CALCIUM 8.5* 8.6*   LFT  Recent Labs  05/24/15 0605  PROT 6.5  ALBUMIN 3.3*  AST 74*  ALT 46  ALKPHOS 134*  BILITOT 8.5*   Studies/Results: CXR JUN 11-[facility]   LOS: 1 day   Azani Brogdon  05/24/2015, 1:50 PM

## 2015-05-25 DIAGNOSIS — R609 Edema, unspecified: Secondary | ICD-10-CM

## 2015-05-25 DIAGNOSIS — E871 Hypo-osmolality and hyponatremia: Secondary | ICD-10-CM

## 2015-05-25 DIAGNOSIS — K7290 Hepatic failure, unspecified without coma: Secondary | ICD-10-CM

## 2015-05-25 LAB — BASIC METABOLIC PANEL
Anion gap: 9 (ref 5–15)
BUN: 27 mg/dL — AB (ref 6–20)
CALCIUM: 8.7 mg/dL — AB (ref 8.9–10.3)
CO2: 24 mmol/L (ref 22–32)
CREATININE: 0.7 mg/dL (ref 0.44–1.00)
Chloride: 96 mmol/L — ABNORMAL LOW (ref 101–111)
GFR calc Af Amer: >60 mL/min (ref >60)
GFR calc non Af Amer: >60 mL/min (ref >60)
Glucose, Bld: 82 mg/dL (ref 65–99)
Potassium: 4.6 mmol/L (ref 3.5–5.1)
Sodium: 129 mmol/L — ABNORMAL LOW (ref 135–145)

## 2015-05-25 MED ORDER — TORSEMIDE 20 MG PO TABS: 20.0000 mg | ORAL_TABLET | Freq: Two times a day (BID) | ORAL | Status: DC

## 2015-05-25 MED ORDER — SPIRONOLACTONE 100 MG PO TABS: 100.0000 mg | ORAL_TABLET | Freq: Every day | ORAL | Status: DC

## 2015-05-25 MED ORDER — NYSTATIN 100000 UNIT/ML MT SUSP: 5.0000 mL | Freq: Four times a day (QID) | OROMUCOSAL | Status: AC

## 2015-05-25 MED ORDER — ALBUMIN HUMAN 25 % IV SOLN
25.0000 g | INTRAVENOUS | Status: AC
  Administered 2015-05-25: 25 g via INTRAVENOUS
  Filled 2015-05-25: qty 100

## 2015-05-25 MED ORDER — FUROSEMIDE 10 MG/ML IJ SOLN
40.0000 mg | Freq: Once | INTRAMUSCULAR | Status: AC
  Administered 2015-05-25: 40 mg via INTRAVENOUS
  Filled 2015-05-25: qty 4

## 2015-05-25 NOTE — Progress Notes (Signed)
UR chart review completed.  

## 2015-05-25 NOTE — Progress Notes (Addendum)
Patient ID: Cynthia Stephenson, female   DOB: 09/05/1966, 49 y.o.   MRN: [id number]    Assessment/Plan: ADMITTED WITH FLUID OVERLOAD MELD SCORE 28, CHILD PUGH B. CLINICALLY IMPROVED.  PLAN: 1. DISCUSSED WITH DR. Yolanda Stephenson. D/C TODAY AND HEART CATH JUN 14. 2. CONTINUE DEMADEX 40 MG DAILY WITH ALDACTONE 100 MG DAILY. 3. OPV W/ DR. Karilyn Stephenson IN 4-6 WEEKS. 4. DISCUSSED WITH PT THE BENEFITS OF THE HEART CATH OUTWEIGH THE RISKS. SHE WILL NOT GET A LIVER W/O THE HEART CATH. IF PT CANNOT TOLERTAE ORAL DIURETIC, COULD ADMIT ONCE A MONTH FOR IV ALBUMIN/LASIX DRIP FOR 3 DAYS TO IMPROVE FLUID MANAGEMENT. WILL DISCUSS WITH DR. Karilyn Stephenson WHEN HE RETURNS ON WED JUN 15. 5. Albumin 25 gms iv X 1 now then LASIX 40 MG IV x1.  Subjective: Since I last evaluated the patient SHE IS CONCERNED ABOUT HER FLUID OVERLOAD. SHE IS SCARED ABOUT HAVING HEART CATH. CONCERNED ABOUT WEIGHT & FATIGUE.  Objective: Vital signs in last 24 hours: Filed Vitals:   05/25/15 1322  BP: 136/65  Pulse: 93  Temp: 98.4 F (36.9 C)  Resp: 16   General appearance, alert, cooperative and no distress Resp: clear to auscultation bilaterally Cardio: regular rate and rhythm GI: soft, non-tender; bowel sounds normal;   Extremities: edema TRACE-1+  Lab Results:  Na 129, Cr 0.7 Studies/Results: No results found.  Medications: I have reviewed the patient's current medications.   LOS: 5 days   Cynthia Stephenson 05/22/2014, 2:23 PM

## 2015-05-25 NOTE — Progress Notes (Signed)
Patient discharged home with family.  IV removed - WNL.  Medications reviewed.  Instructed to follow up with PCP and GI.  Patient to follow with present plans for heart cath tomorrow.  Verbalizes understanding of DC instructions.  No questions at this time, stable to DC home.  Assisted off unit via WC with NT assist.

## 2015-05-25 NOTE — Care Management Note (Signed)
Case Management Note  Patient Details  Name: Cynthia Stephenson MRN: [id number] Date of Birth: 07/30/1966  Subjective/Objective:                  Pt admitted from home with liver failure. Pt lives with her husband and will return home at discharge. Pt is independent with ADL's. Pt is currently undergoing testing to be placed on the liver transplant list.  Action/Plan: No CM needs noted.  Expected Discharge Date:                  Expected Discharge Plan:  Home/Self Care  In-House Referral:  NA  Discharge planning Services  CM Consult  Post Acute Care Choice:  NA Choice offered to:  NA  DME Arranged:    DME Agency:     HH Arranged:    HH Agency:     Status of Service:  Completed, signed off  Medicare Important Message Given:    Date Medicare IM Given:    Medicare IM give by:    Date Additional Medicare IM Given:    Additional Medicare Important Message give by:     If discussed at Long Length of Stay Meetings, dates discussed:    Additional Comments:  Cheryl Flash, RN 05/25/2015, 12:51 PM

## 2015-05-25 NOTE — Discharge Summary (Addendum)
Physician Discharge Summary  Shuron Cassity OZY:[id number] DOB: 1966/08/17 DOA: 05/23/2015  PCP: [name], MD  Admit date: 05/23/2015 Discharge date: 05/25/2015  Time spent: 35 minutes  Recommendations for Outpatient Follow-up:  1. Discharge home with outpatient PCP follow-up in 1 week. Needs to check sodium and creatinine. 2. Follow-up with Dr. Karilyn Cota  in 4 weeks. 3. Patient has elective cardiac cath tomorrow at [facility].  Discharge Diagnoses:  Principal Problem:   Decompensation of cirrhosis of liver  Active Problems:   Hypothyroidism   Seizure disorder   Dyspnea   Peripheral edema   Volume overload   Hyponatremia   Anemia of chronic disease   Discharge Condition: Fair  Diet recommendation: Regular  Filed Weights   05/23/15 1408 05/24/15 0556 05/25/15 0500  Weight: 111.1 kg (244 lb 14.9 oz) 107.4 kg (236 lb 12.4 oz) 106.867 kg (235 lb 9.6 oz)    History of present illness:  Please refer to admission H&P for details, in brief, 49 year old woman with NAFLD with cirrhosis currently following a [facility] undergoing preparation to become a liver transplant candidate and who presents with increasing shortness of breath, fatigue and dramatic weight gain over the last 2 weeks.initial evaluation revealed volume overload with decompensated cirrhosis.   Hospital Course:  Acute on chronic Decompensated cirrhosis with volume overload Secondary to decompensated nonalcoholic fatty liver disease. Patient presented with volume overload, hyponatremia, hyperbilirubinemia, coagulopathy.. Her MELD score is 28 and is being evaluated at [facility] (hepatologist Dr. Jason Coop) for liver transplant. Patient placed on IV Lasix and diuresed about 5 L since admission (discharge weight of 235 pounds).  -Patient had been advised to stop taking Demadex and Aldactone one week prior to admission as she appeared increasingly dehydrated. -Patient appears clinically stable although still has some volume overload.  Received one dose IV albumin and will receive another dose prior to discharge. -GI consult Dr. Darrick Penna evaluated the patient and discussed about her hospitalization with patient's hepatologist at [facility]. Dr Julieta Gutting recommended that patient is stable be discharged to get her elective cardiac cath prior to her liver transplant. -After discussing with Dr. fields patient instructed to resume Demadex 40 mg once a day and Aldactone 100 mg daily. (Her home dose is Demadex 40 mg twice a day and Aldactone 100 mg twice daily) -Patient should follow-up with Dr. Loreta Ave in 4 weeks. -Dr. Darrick Penna also recommend that if patient not tolerating oral diabetic, she may need monthly hospitalization for IV Lasix drip with IV albumin .  -Patient should follow-up with her PCP to have her sodium and renal function checked by the end of the week. She has been instructed that if she has worsening leg swellings abdominal distention or shortness of breath she should return to the ED. -Continue PPI, ursodiol and lactulose.   Hyponatremia Secondary to decompensated liver cirrhosis. Improved since admission.  Anemia of chronic disease Stable  Hypothyroidism Continue Synthroid.  Seizure disorder Continue Keppra    Procedures:  none  Consultations:  GI ( Dr fields)  Discharge Exam: Filed Vitals:   05/25/15 1322  BP: 136/65  Pulse: 93  Temp: 98.4 F (36.9 C)  Resp: 16    General: middle aged female in NAD HEENT: no pallor, ectatic, moist oral mucosa, supple neck  Cardiovascular: S1 and S2, no murmurs rub or gallop  Respiratory: Diminished bibasilar breath sounds GI: Soft, distended, nontender, Musculoskeletal: Warm, 1+ pitting edema bilaterally CNS: Alert and oriented, nonfocal  Discharge Instructions    Current Discharge Medication List    START  taking these medications   Details  spironolactone (ALDACTONE) 100 MG tablet Take 1 tablet (100 mg total) by mouth daily. Qty: 30 tablet, Refills: 0     torsemide (DEMADEX) 20 MG tablet Take 1 tablet (20 mg total) by mouth 2 (two) times daily. Qty: 30 tablet, Refills: 0      CONTINUE these medications which have NOT CHANGED   Details  lactulose (CHRONULAC) 10 GM/15ML solution Take 30 mLs (20 g total) by mouth 3 (three) times daily. Qty: 240 mL, Refills: 0    levETIRAcetam (KEPPRA) 500 MG tablet Take 1 tablet (500 mg total) by mouth 2 (two) times daily. Qty: 180 tablet, Refills: 4    levothyroxine (SYNTHROID, LEVOTHROID) 75 MCG tablet Take 75 mcg by mouth daily before breakfast.    LORazepam (ATIVAN) 0.5 MG tablet Take 1 tablet (0.5 mg total) by mouth as needed for anxiety. Qty: 30 tablet, Refills: 0   Associated Diagnoses: NAFLD (nonalcoholic fatty liver disease)    Multiple Vitamin (MULTIVITAMIN WITH MINERALS) TABS tablet Take 1 tablet by mouth daily.    ondansetron (ZOFRAN) 4 MG tablet Take 1 tablet (4 mg total) by mouth every 8 (eight) hours as needed for nausea or vomiting. Qty: 30 tablet, Refills: 1    pantoprazole (PROTONIX) 40 MG tablet Take 1 tablet (40 mg total) by mouth daily before breakfast. Qty: 30 tablet, Refills: 1    traMADol (ULTRAM) 50 MG tablet Take 50 mg by mouth every 6 (six) hours as needed. pain Refills: 0    triamcinolone cream (KENALOG) 0.1 % Apply 1 application topically 2 (two) times daily as needed. itching Qty: 30 g, Refills: 0    ursodiol (ACTIGALL) 300 MG capsule Take 1 capsule (300 mg total) by mouth 3 (three) times daily. Qty: 90 capsule, Refills: 4       No Known Allergies Follow-up Information    Follow up with REHMAN,NAJEEB U, MD In 4 weeks.   Specialty:  Gastroenterology   Contact information:   [address], [address] [place] [address] [phone]       Follow up with [name], MD. Schedule an appointment as soon as possible for a visit in 1 week.   Specialty:  Internal Medicine   Contact information:    [address]  [place] [address] [phone]        The  results of significant diagnostics from this hospitalization (including imaging, microbiology, ancillary and laboratory) are listed below for reference.    Significant Diagnostic Studies: Dg Chest 2 View  05/23/2015   CLINICAL DATA:  Worsening dyspnea over the past 2 days.  EXAM: CHEST  2 VIEW  COMPARISON:  03/19/2015  FINDINGS: The heart size and mediastinal contours are within normal limits. Both lungs are clear. The visualized skeletal structures are unremarkable.  IMPRESSION: No active cardiopulmonary disease.   Electronically Signed   By: Ellery Plunk M.D.   On: 05/23/2015 06:13    Microbiology: No results found for this or any previous visit (from the past 240 hour(s)).   Labs: Basic Metabolic Panel:  Recent Labs Lab 05/23/15 0656 05/24/15 0605 05/25/15 0616  NA 125* 127* 129*  K 5.0 4.6 4.6  CL 96* 95* 96*  CO2 21* 24 24  GLUCOSE 95 100* 82  BUN 29* 25* 27*  CREATININE 0.79 0.68 0.70  CALCIUM 8.5* 8.6* 8.7*   Liver Function Tests:  Recent Labs Lab 05/23/15 0656 05/24/15 0605  AST 77* 74*  ALT 48 46  ALKPHOS 143* 134*  BILITOT 8.3* 8.5*  PROT 6.1* 6.5  ALBUMIN 2.8* 3.3*   No results for input(s): LIPASE, AMYLASE in the last 168 hours. No results for input(s): AMMONIA in the last 168 hours. CBC:  Recent Labs Lab 05/23/15 0656 05/24/15 0605  WBC 9.6 7.3  NEUTROABS 5.9  --   HGB 10.6* 10.2*  HCT 29.7* 29.6*  MCV 96.7 98.3  PLT 133* 116*   Cardiac Enzymes:  Recent Labs Lab 05/23/15 0656  TROPONINI 0.03   BNP: BNP (last 3 results)  Recent Labs  03/19/15 1340 05/23/15 0656  BNP 158.0* 101.0*    ProBNP (last 3 results) No results for input(s): PROBNP in the last 8760 hours.  CBG: No results for input(s): GLUCAP in the last 168 hours.     SignedEddie North  [facility] 05/25/2015, 3:37 PM

## 2015-05-26 ENCOUNTER — Ambulatory Visit: Payer: BLUE CROSS/BLUE SHIELD | Admitting: Diagnostic Neuroimaging

## 2015-05-27 NOTE — Telephone Encounter (Signed)
She started Demadex 20mg  and Aldactone 100mg  today.  Will get an hepatic function on her tomorrow Her weight today 232.

## 2015-05-28 LAB — HEPATIC FUNCTION PANEL
ALT: 39 U/L — AB (ref 0–35)
AST: 67 U/L — ABNORMAL HIGH (ref 0–37)
Albumin: 3.4 g/dL — ABNORMAL LOW (ref 3.5–5.2)
Alkaline Phosphatase: 134 U/L — ABNORMAL HIGH (ref 39–117)
BILIRUBIN INDIRECT: 3.5 mg/dL — AB (ref 0.2–1.2)
Bilirubin, Direct: 5.1 mg/dL — ABNORMAL HIGH (ref 0.0–0.3)
Total Bilirubin: 8.6 mg/dL — ABNORMAL HIGH (ref 0.2–1.2)
Total Protein: 5.9 g/dL — ABNORMAL LOW (ref 6.0–8.3)

## 2015-06-01 ENCOUNTER — Telehealth (INDEPENDENT_AMBULATORY_CARE_PROVIDER_SITE_OTHER): Payer: Self-pay | Admitting: *Deleted

## 2015-06-01 NOTE — Telephone Encounter (Signed)
Per Dr.Rehman - there was not enough fluid present to be drawn off.

## 2015-06-01 NOTE — Telephone Encounter (Signed)
Cynthia Stephenson said she spoke with Dr. Darrick Penna about her weight gained of 5 lbs or more. Dr. Darrick Penna said she would speak with Dr. Karilyn Cota about putting her into the hospital to get the fluid pulled off her. The medication is no longer working. Cynthia Stephenson said she was going back to [facility] tomorrow to redo a test that was done incorrectly. Her return phone number is [phone].

## 2015-06-01 NOTE — Telephone Encounter (Signed)
Per Dr.Rehman the patient needs a office visit with Camelia Eng this week.

## 2015-06-02 ENCOUNTER — Inpatient Hospital Stay (HOSPITAL_COMMUNITY)
Admission: EM | Admit: 2015-06-02 | Discharge: 2015-06-09 | DRG: 433 | Disposition: A | Discharge status: Home / Self Care | Payer: BLUE CROSS/BLUE SHIELD | Attending: Internal Medicine | Admitting: Internal Medicine

## 2015-06-02 ENCOUNTER — Encounter (HOSPITAL_COMMUNITY): Payer: Self-pay | Admitting: *Deleted

## 2015-06-02 ENCOUNTER — Emergency Department (HOSPITAL_COMMUNITY): Payer: BLUE CROSS/BLUE SHIELD

## 2015-06-02 DIAGNOSIS — J811 Chronic pulmonary edema: Secondary | ICD-10-CM | POA: Diagnosis present

## 2015-06-02 DIAGNOSIS — Z8379 Family history of other diseases of the digestive system: Secondary | ICD-10-CM

## 2015-06-02 DIAGNOSIS — K7581 Nonalcoholic steatohepatitis (NASH): Secondary | ICD-10-CM | POA: Diagnosis present

## 2015-06-02 DIAGNOSIS — D6959 Other secondary thrombocytopenia: Secondary | ICD-10-CM | POA: Diagnosis present

## 2015-06-02 DIAGNOSIS — Z803 Family history of malignant neoplasm of breast: Secondary | ICD-10-CM | POA: Diagnosis not present

## 2015-06-02 DIAGNOSIS — E871 Hypo-osmolality and hyponatremia: Secondary | ICD-10-CM | POA: Diagnosis present

## 2015-06-02 DIAGNOSIS — I272 Other secondary pulmonary hypertension: Secondary | ICD-10-CM | POA: Diagnosis present

## 2015-06-02 DIAGNOSIS — D696 Thrombocytopenia, unspecified: Secondary | ICD-10-CM | POA: Diagnosis present

## 2015-06-02 DIAGNOSIS — Z8041 Family history of malignant neoplasm of ovary: Secondary | ICD-10-CM | POA: Diagnosis not present

## 2015-06-02 DIAGNOSIS — R609 Edema, unspecified: Secondary | ICD-10-CM

## 2015-06-02 DIAGNOSIS — G40909 Epilepsy, unspecified, not intractable, without status epilepticus: Secondary | ICD-10-CM | POA: Diagnosis present

## 2015-06-02 DIAGNOSIS — K7469 Other cirrhosis of liver: Secondary | ICD-10-CM | POA: Diagnosis not present

## 2015-06-02 DIAGNOSIS — K7460 Unspecified cirrhosis of liver: Secondary | ICD-10-CM | POA: Diagnosis present

## 2015-06-02 DIAGNOSIS — R601 Generalized edema: Secondary | ICD-10-CM | POA: Diagnosis present

## 2015-06-02 DIAGNOSIS — E8770 Fluid overload, unspecified: Secondary | ICD-10-CM | POA: Diagnosis present

## 2015-06-02 DIAGNOSIS — L299 Pruritus, unspecified: Secondary | ICD-10-CM | POA: Diagnosis present

## 2015-06-02 DIAGNOSIS — Z808 Family history of malignant neoplasm of other organs or systems: Secondary | ICD-10-CM | POA: Diagnosis not present

## 2015-06-02 DIAGNOSIS — Z8249 Family history of ischemic heart disease and other diseases of the circulatory system: Secondary | ICD-10-CM

## 2015-06-02 DIAGNOSIS — K7290 Hepatic failure, unspecified without coma: Secondary | ICD-10-CM | POA: Diagnosis present

## 2015-06-02 DIAGNOSIS — D638 Anemia in other chronic diseases classified elsewhere: Secondary | ICD-10-CM | POA: Diagnosis present

## 2015-06-02 DIAGNOSIS — Z6841 Body Mass Index (BMI) 40.0 and over, adult: Secondary | ICD-10-CM | POA: Diagnosis not present

## 2015-06-02 DIAGNOSIS — D649 Anemia, unspecified: Secondary | ICD-10-CM | POA: Diagnosis not present

## 2015-06-02 DIAGNOSIS — E039 Hypothyroidism, unspecified: Secondary | ICD-10-CM | POA: Diagnosis present

## 2015-06-02 DIAGNOSIS — R252 Cramp and spasm: Secondary | ICD-10-CM | POA: Diagnosis present

## 2015-06-02 DIAGNOSIS — R0600 Dyspnea, unspecified: Secondary | ICD-10-CM | POA: Diagnosis present

## 2015-06-02 DIAGNOSIS — Z7682 Awaiting organ transplant status: Secondary | ICD-10-CM

## 2015-06-02 DIAGNOSIS — Z833 Family history of diabetes mellitus: Secondary | ICD-10-CM

## 2015-06-02 DIAGNOSIS — R55 Syncope and collapse: Secondary | ICD-10-CM | POA: Diagnosis not present

## 2015-06-02 DIAGNOSIS — R188 Other ascites: Secondary | ICD-10-CM | POA: Diagnosis present

## 2015-06-02 LAB — CBC WITH DIFFERENTIAL/PLATELET
BASOS PCT: 1 % (ref 0–1)
Basophils Absolute: 0.1 10*3/uL (ref 0.0–0.1)
EOS ABS: 0.3 10*3/uL (ref 0.0–0.7)
Eosinophils Relative: 3 % (ref 0–5)
HEMATOCRIT: 28.9 % — AB (ref 36.0–46.0)
Hemoglobin: 10.1 g/dL — ABNORMAL LOW (ref 12.0–15.0)
Lymphocytes Relative: 20 % (ref 12–46)
Lymphs Abs: 1.9 10*3/uL (ref 0.7–4.0)
MCH: 34 pg (ref 26.0–34.0)
MCHC: 34.9 g/dL (ref 30.0–36.0)
MCV: 97.3 fL (ref 78.0–100.0)
MONOS PCT: 11 % (ref 3–12)
Monocytes Absolute: 1 10*3/uL (ref 0.1–1.0)
NEUTROS ABS: 6.2 10*3/uL (ref 1.7–7.7)
Neutrophils Relative %: 65 % (ref 43–77)
Platelets: 114 10*3/uL — ABNORMAL LOW (ref 150–400)
RBC: 2.97 MIL/uL — ABNORMAL LOW (ref 3.87–5.11)
RDW: 18.4 % — AB (ref 11.5–15.5)
Smear Review: ADEQUATE
WBC: 9.4 10*3/uL (ref 4.0–10.5)

## 2015-06-02 LAB — COMPREHENSIVE METABOLIC PANEL
ALBUMIN: 3.2 g/dL — AB (ref 3.5–5.0)
ALK PHOS: 117 U/L (ref 38–126)
ALT: 46 U/L (ref 14–54)
AST: 81 U/L — ABNORMAL HIGH (ref 15–41)
Anion gap: 9 (ref 5–15)
BUN: 24 mg/dL — ABNORMAL HIGH (ref 6–20)
CALCIUM: 8.7 mg/dL — AB (ref 8.9–10.3)
CO2: 22 mmol/L (ref 22–32)
Chloride: 94 mmol/L — ABNORMAL LOW (ref 101–111)
Creatinine, Ser: 0.93 mg/dL (ref 0.44–1.00)
GFR calc Af Amer: >60 mL/min (ref >60)
GFR calc non Af Amer: >60 mL/min (ref >60)
Glucose, Bld: 118 mg/dL — ABNORMAL HIGH (ref 65–99)
POTASSIUM: 4.8 mmol/L (ref 3.5–5.1)
SODIUM: 125 mmol/L — AB (ref 135–145)
TOTAL PROTEIN: 6.4 g/dL — AB (ref 6.5–8.1)
Total Bilirubin: 9.8 mg/dL — ABNORMAL HIGH (ref 0.3–1.2)

## 2015-06-02 LAB — BRAIN NATRIURETIC PEPTIDE: B Natriuretic Peptide: 291 pg/mL — ABNORMAL HIGH (ref 0.0–100.0)

## 2015-06-02 MED ORDER — FUROSEMIDE 10 MG/ML IJ SOLN
80.0000 mg | Freq: Once | INTRAMUSCULAR | Status: AC
  Administered 2015-06-02: 80 mg via INTRAVENOUS
  Filled 2015-06-02: qty 8

## 2015-06-02 NOTE — ED Notes (Addendum)
Pt reporting fluid retention in abdomen that is leading to SOB.  Pt reports symptoms worsening over past 3-4 days.  Pt has history of cirrhosis and reports frequent issues with fluid accumulation.  Pt reports she also had a heart cath today at [facility].

## 2015-06-02 NOTE — ED Notes (Signed)
MD at bedside. 

## 2015-06-02 NOTE — ED Provider Notes (Signed)
CSN: [id number]     Arrival date & time 06/02/15  2104 History  This chart was scribed for Bethann Berkshire, MD by Abel Presto, ED Scribe. This patient was seen in room APA09/APA09 and the patient's care was started at 9:16 PM.     Chief Complaint  Patient presents with  . Shortness of Breath     Patient is a 49 y.o. female presenting with shortness of breath. The history is provided by the patient. No language interpreter was used.  Shortness of Breath Severity:  Moderate Onset quality:  Gradual Duration:  4 days Timing:  Constant Progression:  Worsening Chronicity:  Recurrent Associated symptoms: no abdominal pain, no chest pain, no cough, no headaches and no rash    HPI Comments: Cynthia Stephenson is a 49 y.o. female with PMHx of cirrhosis, thrombocytopenia, seizures, and hypothyroidism who presents to the Emergency Department complaining of SOB with onset 3-4 days ago, worsening today. Pt reports fluid retention in lower extremities. Pt reports she had a heart cath done today in [place]. Pt was last seen in ED on 05/23/15 for same and was admitted. She was d/c on 05/25/15. Pt denies fluid retention in chest and abdominal pain.   Past Medical History  Diagnosis Date  . Hypothyroidism   . Seizures   . Cirrhosis   . Thrombocytopenia    Past Surgical History  Procedure Laterality Date  . Abdominal hysterectomy    . Liver biopsy    . Esophagogastroduodenoscopy N/A 05/01/2015    Procedure: ESOPHAGOGASTRODUODENOSCOPY (EGD);  Surgeon: Malissa Hippo, MD;  [facility]: [facility];  Service: Endoscopy;  Laterality: N/A;  . Cesearin section    . Cardiac catheterization     Family History  Problem Relation Age of Onset  . Breast cancer Mother   . Ovarian cancer Mother   . Thyroid cancer Mother   . Diabetes Father   . Heart disease Father   . Skin cancer Father   . Celiac disease Paternal Aunt    History  Substance Use Topics  . Smoking status: Never Smoker   . Smokeless  tobacco: Never Used  . Alcohol Use: No   OB History    No data available     Review of Systems  Constitutional: Negative for appetite change and fatigue.  HENT: Negative for congestion, ear discharge and sinus pressure.   Eyes: Negative for discharge.  Respiratory: Positive for shortness of breath. Negative for cough.   Cardiovascular: Negative for chest pain.  Gastrointestinal: Negative for abdominal pain and diarrhea.  Genitourinary: Negative for frequency and hematuria.  Musculoskeletal: Negative for back pain.  Skin: Negative for rash.  Neurological: Negative for seizures and headaches.  Psychiatric/Behavioral: Negative for hallucinations.      Allergies  Review of patient's allergies indicates no known allergies.  Home Medications   Prior to Admission medications   Medication Sig Start Date End Date Taking? Authorizing Provider  lactulose (CHRONULAC) 10 GM/15ML solution Take 30 mLs (20 g total) by mouth 3 (three) times daily. 05/04/15 06/03/15  Gwenyth Bender, NP  levETIRAcetam (KEPPRA) 500 MG tablet Take 1 tablet (500 mg total) by mouth 2 (two) times daily. 02/20/15   Suanne Marker, MD  levothyroxine (SYNTHROID, LEVOTHROID) 75 MCG tablet Take 75 mcg by mouth daily before breakfast.    Historical Provider, MD  LORazepam (ATIVAN) 0.5 MG tablet Take 1 tablet (0.5 mg total) by mouth as needed for anxiety. 05/18/15   Len Blalock, NP  Multiple Vitamin (  MULTIVITAMIN WITH MINERALS) TABS tablet Take 1 tablet by mouth daily.    Historical Provider, MD  ondansetron (ZOFRAN) 4 MG tablet Take 1 tablet (4 mg total) by mouth every 8 (eight) hours as needed for nausea or vomiting. 05/14/15   Len Blalock, NP  pantoprazole (PROTONIX) 40 MG tablet Take 1 tablet (40 mg total) by mouth daily before breakfast. 05/04/15   Gwenyth Bender, NP  spironolactone (ALDACTONE) 100 MG tablet Take 1 tablet (100 mg total) by mouth daily. 05/25/15   Nishant Dhungel, MD  torsemide (DEMADEX) 20 MG tablet Take  1 tablet (20 mg total) by mouth 2 (two) times daily. 05/25/15   Nishant Dhungel, MD  traMADol (ULTRAM) 50 MG tablet Take 50 mg by mouth every 6 (six) hours as needed. pain 04/08/15   Historical Provider, MD  triamcinolone cream (KENALOG) 0.1 % Apply 1 application topically 2 (two) times daily as needed. itching 05/04/15   Erick Blinks, MD  ursodiol (ACTIGALL) 300 MG capsule Take 1 capsule (300 mg total) by mouth 3 (three) times daily. 04/03/15   Brand Males [name], NP   BP 106/62 mmHg  Pulse 95  Temp(Src) 98.1 F (36.7 C) (Oral)  Resp 24  Ht 5\' 2"  (1.575 m)  Wt 249 lb (112.946 kg)  BMI 45.53 kg/m2  SpO2 98% Physical Exam  Constitutional: She is oriented to person, place, and time. She appears well-developed.  HENT:  Head: Normocephalic.  Eyes: Conjunctivae and EOM are normal. No scleral icterus.  Neck: Neck supple. No thyromegaly present.  Cardiovascular: Normal rate and regular rhythm.  Exam reveals no gallop and no friction rub.   No murmur heard. Pulmonary/Chest: No stridor. She has no wheezes. She has no rales. She exhibits no tenderness.  Abdominal: She exhibits no distension. There is no tenderness. There is no rebound.  edema  Musculoskeletal: Normal range of motion. She exhibits edema. Tenderness: 2+ in ankles.  Lymphadenopathy:    She has no cervical adenopathy.  Neurological: She is alert and oriented to person, place, and time. She exhibits normal muscle tone. Coordination normal.  Skin: No rash noted. No erythema.  Psychiatric: She has a normal mood and affect. Her behavior is normal.  Nursing note and vitals reviewed.   ED Course  Procedures (including critical care time) DIAGNOSTIC STUDIES: Oxygen Saturation is 98% on room air, normal by my interpretation.    COORDINATION OF CARE: 9:19 PM Discussed treatment plan with patient at beside, the patient agrees with the plan and has no further questions at this time.   Labs Review Labs Reviewed - No data to  display  Imaging Review No results found.   EKG Interpretation None      MDM   Final diagnoses:  None    Anasarca,   Admit  The chart was scribed for me under my direct supervision.  I personally performed the history, physical, and medical decision making and all procedures in the evaluation of this patient.Bethann Berkshire, MD 06/02/15 [phone]

## 2015-06-02 NOTE — H&P (Signed)
Cynthia Stephenson is an 49 y.o. female.    Delphina Cahill (pcp)  Chief Complaint: edema HPI: 49 yo female with hx of cirrhosis and ascites ? Due to NASH, apparently c/o increasing edema for the past 1 week and also some slight sob.  Denies fever, chills, cough, orthopnea, pnd.   Pt presented to the ED and CXR showed ? Pulmonary vascular congestion and pt was noted to be anemic.  Pt will be admitted for w/up of edema, secondary to cirrhosis.   Past Medical History  Diagnosis Date  . Hypothyroidism   . Seizures   . Cirrhosis   . Thrombocytopenia     Past Surgical History  Procedure Laterality Date  . Abdominal hysterectomy    . Liver biopsy  02/16/2014  . Esophagogastroduodenoscopy N/A 05/01/2015    Procedure: ESOPHAGOGASTRODUODENOSCOPY (EGD);  Surgeon: Rogene Houston, MD;  Location: [facility];  Service: Endoscopy;  Laterality: N/A;  . Cesearin section    . Cardiac catheterization    . Cesarean section      x2    Family History  Problem Relation Age of Onset  . Breast cancer Mother   . Ovarian cancer Mother   . Thyroid cancer Mother   . Diabetes Father   . Heart disease Father   . Skin cancer Father   . Celiac disease Paternal Aunt    Social History:  reports that she has never smoked. She has never used smokeless tobacco. She reports that she does not drink alcohol or use illicit drugs.  Allergies: No Known Allergies Medications reviewed   Results for orders placed or performed during the hospital encounter of 06/02/15 (from the past 48 hour(s))  Comprehensive metabolic panel     Status: Abnormal   Collection Time: 06/02/15  9:15 PM  Result Value Ref Range   Sodium 125 (L) 135 - 145 mmol/L   Potassium 4.8 3.5 - 5.1 mmol/L   Chloride 94 (L) 101 - 111 mmol/L   CO2 22 22 - 32 mmol/L   Glucose, Bld 118 (H) 65 - 99 mg/dL   BUN 24 (H) 6 - 20 mg/dL   Creatinine, Ser 0.93 0.44 - 1.00 mg/dL   Calcium 8.7 (L) 8.9 - 10.3 mg/dL   Total Protein 6.4 (L) 6.5 - 8.1 g/dL   Albumin 3.2  (L) 3.5 - 5.0 g/dL   AST 81 (H) 15 - 41 U/L   ALT 46 14 - 54 U/L   Alkaline Phosphatase 117 38 - 126 U/L   Total Bilirubin 9.8 (H) 0.3 - 1.2 mg/dL   GFR calc non Af Amer >60 >60 mL/min   GFR calc Af Amer >60 >60 mL/min    Comment: (NOTE) The eGFR has been calculated using the CKD EPI equation. This calculation has not been validated in all clinical situations. eGFR's persistently <60 mL/min signify possible Chronic Kidney Disease.    Anion gap 9 5 - 15  CBC with Differential/Platelet     Status: Abnormal   Collection Time: 06/02/15  9:15 PM  Result Value Ref Range   WBC 9.4 4.0 - 10.5 K/uL   RBC 2.97 (L) 3.87 - 5.11 MIL/uL   Hemoglobin 10.1 (L) 12.0 - 15.0 g/dL   HCT 28.9 (L) 36.0 - 46.0 %   MCV 97.3 78.0 - 100.0 fL   MCH 34.0 26.0 - 34.0 pg   MCHC 34.9 30.0 - 36.0 g/dL   RDW 18.4 (H) 11.5 - 15.5 %   Platelets 114 (L) 150 - 400  K/uL    Comment: RESULT REPEATED AND VERIFIED SPECIMEN CHECKED FOR CLOTS    Neutrophils Relative % 65 43 - 77 %   Neutro Abs 6.2 1.7 - 7.7 K/uL   Lymphocytes Relative 20 12 - 46 %   Lymphs Abs 1.9 0.7 - 4.0 K/uL   Monocytes Relative 11 3 - 12 %   Monocytes Absolute 1.0 0.1 - 1.0 K/uL   Eosinophils Relative 3 0 - 5 %   Eosinophils Absolute 0.3 0.0 - 0.7 K/uL   Basophils Relative 1 0 - 1 %   Basophils Absolute 0.1 0.0 - 0.1 K/uL   WBC Morphology TOXIC GRANULATION    RBC Morphology TARGET CELLS    Smear Review PLATELETS APPEAR ADEQUATE   Brain natriuretic peptide     Status: Abnormal   Collection Time: 06/02/15  9:15 PM  Result Value Ref Range   B Natriuretic Peptide 291.0 (H) 0.0 - 100.0 pg/mL   Dg Chest Portable 1 View  06/02/2015   CLINICAL DATA:  Acute onset of shortness of breath. Lower extremity swelling. Initial encounter.  EXAM: PORTABLE CHEST - 1 VIEW  COMPARISON:  Chest radiograph performed 05/23/2015  FINDINGS: The lungs are well-aerated. Vascular congestion is noted. Mild bibasilar opacities likely reflect mild interstitial edema. There  is no evidence of pleural effusion or pneumothorax.  The cardiomediastinal silhouette is borderline normal in size. No acute osseous abnormalities are seen.  IMPRESSION: Vascular congestion noted. Mild bibasilar opacities likely reflect mild interstitial edema.   Electronically Signed   By: Garald Balding M.D.   On: 06/02/2015 22:13    Review of Systems  Constitutional: Negative.   HENT: Negative.   Eyes: Negative.   Respiratory: Negative.   Cardiovascular: Positive for leg swelling. Negative for chest pain, palpitations, orthopnea and claudication.  Gastrointestinal: Negative.   Genitourinary: Negative.   Musculoskeletal: Negative.   Skin: Negative.   Neurological: Negative.   Endo/Heme/Allergies: Negative.   Psychiatric/Behavioral: Negative.     Blood pressure 120/48, pulse 94, temperature 97.9 F (36.6 C), temperature source Oral, resp. rate 15, height $RemoveBe'5\' 2"'SECUlhtpQ$  (1.575 m), weight 112.946 kg (249 lb), SpO2 100 %. Physical Exam  Constitutional: She is oriented to person, place, and time. She appears well-developed and well-nourished.  HENT:  Head: Normocephalic and atraumatic.  Mouth/Throat: No oropharyngeal exudate.  Eyes: Conjunctivae and EOM are normal. Pupils are equal, round, and reactive to light. No scleral icterus.  Neck: Normal range of motion. Neck supple. No JVD present. No tracheal deviation present. No thyromegaly present.  Cardiovascular: Normal rate and regular rhythm.  Exam reveals no gallop and no friction rub.   No murmur heard. Respiratory: Effort normal. No respiratory distress. She has no wheezes. She has rales. She exhibits no tenderness.  GI: Soft. Bowel sounds are normal. She exhibits distension. There is no tenderness. There is no rebound and no guarding.  Musculoskeletal: Normal range of motion. She exhibits edema. She exhibits no tenderness.  Neurological: She is alert and oriented to person, place, and time. She has normal reflexes. She displays normal  reflexes. No cranial nerve deficit. She exhibits normal muscle tone. Coordination normal.  Skin: Skin is warm and dry. No rash noted. No erythema. No pallor.  Psychiatric: She has a normal mood and affect. Her behavior is normal. Judgment and thought content normal.     Assessment/Plan Edema Check cardiac echo Check tsh Lasix $RemoveB'40mg'akyTdScb$  iv bid  Hyponatremia Check serum osm, tsh, cortisol Check urine sodium, urine osm Check cm in am  Fluid restriction 1.5L  Anemia Check cbc in am Hypothyroidism Cont levothyroxine  Thrombocytopenia Check cbc in am  DVT prophylaxis:  scd   Jani Gravel 06/02/2015, 11:01 PM

## 2015-06-02 NOTE — Telephone Encounter (Signed)
Apt has been scheduled for 06/03/15 with Dorene Ar, NP.

## 2015-06-03 ENCOUNTER — Ambulatory Visit (INDEPENDENT_AMBULATORY_CARE_PROVIDER_SITE_OTHER): Payer: BLUE CROSS/BLUE SHIELD | Admitting: Internal Medicine

## 2015-06-03 ENCOUNTER — Inpatient Hospital Stay (HOSPITAL_COMMUNITY): Payer: BLUE CROSS/BLUE SHIELD

## 2015-06-03 ENCOUNTER — Encounter (HOSPITAL_COMMUNITY): Payer: Self-pay

## 2015-06-03 DIAGNOSIS — E871 Hypo-osmolality and hyponatremia: Secondary | ICD-10-CM

## 2015-06-03 DIAGNOSIS — J811 Chronic pulmonary edema: Secondary | ICD-10-CM

## 2015-06-03 DIAGNOSIS — R601 Generalized edema: Secondary | ICD-10-CM

## 2015-06-03 DIAGNOSIS — K7469 Other cirrhosis of liver: Secondary | ICD-10-CM

## 2015-06-03 DIAGNOSIS — K7290 Hepatic failure, unspecified without coma: Secondary | ICD-10-CM

## 2015-06-03 DIAGNOSIS — D696 Thrombocytopenia, unspecified: Secondary | ICD-10-CM

## 2015-06-03 DIAGNOSIS — R55 Syncope and collapse: Secondary | ICD-10-CM

## 2015-06-03 LAB — COMPREHENSIVE METABOLIC PANEL
ALBUMIN: 3 g/dL — AB (ref 3.5–5.0)
ALT: 43 U/L (ref 14–54)
ANION GAP: 9 (ref 5–15)
AST: 74 U/L — AB (ref 15–41)
Alkaline Phosphatase: 109 U/L (ref 38–126)
BILIRUBIN TOTAL: 9 mg/dL — AB (ref 0.3–1.2)
BUN: 24 mg/dL — ABNORMAL HIGH (ref 6–20)
CALCIUM: 8.5 mg/dL — AB (ref 8.9–10.3)
CHLORIDE: 95 mmol/L — AB (ref 101–111)
CO2: 22 mmol/L (ref 22–32)
CREATININE: 0.82 mg/dL (ref 0.44–1.00)
GFR calc Af Amer: >60 mL/min (ref >60)
Glucose, Bld: 85 mg/dL (ref 65–99)
Potassium: 4.7 mmol/L (ref 3.5–5.1)
Sodium: 126 mmol/L — ABNORMAL LOW (ref 135–145)
Total Protein: 6 g/dL — ABNORMAL LOW (ref 6.5–8.1)

## 2015-06-03 LAB — CBC
HCT: 27 % — ABNORMAL LOW (ref 36.0–46.0)
Hemoglobin: 9.5 g/dL — ABNORMAL LOW (ref 12.0–15.0)
MCH: 34.1 pg — AB (ref 26.0–34.0)
MCHC: 35.2 g/dL (ref 30.0–36.0)
MCV: 96.8 fL (ref 78.0–100.0)
PLATELETS: 99 10*3/uL — AB (ref 150–400)
RBC: 2.79 MIL/uL — AB (ref 3.87–5.11)
RDW: 18.2 % — AB (ref 11.5–15.5)
WBC: 7.6 10*3/uL (ref 4.0–10.5)

## 2015-06-03 LAB — OSMOLALITY, URINE: OSMOLALITY UR: 272 mosm/kg — AB (ref 390–1090)

## 2015-06-03 LAB — CORTISOL: CORTISOL PLASMA: 6.1 ug/dL

## 2015-06-03 LAB — SODIUM, URINE, RANDOM: Sodium, Ur: 79 mmol/L

## 2015-06-03 LAB — OSMOLALITY: OSMOLALITY: 266 mosm/kg — AB (ref 275–300)

## 2015-06-03 LAB — TSH: TSH: 3.738 u[IU]/mL (ref 0.350–4.500)

## 2015-06-03 MED ORDER — PANTOPRAZOLE SODIUM 40 MG PO TBEC
40.0000 mg | DELAYED_RELEASE_TABLET | Freq: Every day | ORAL | Status: DC
  Administered 2015-06-03 – 2015-06-09 (×7): 40 mg via ORAL
  Filled 2015-06-03 (×7): qty 1

## 2015-06-03 MED ORDER — SODIUM CHLORIDE 0.9 % IJ SOLN
3.0000 mL | Freq: Two times a day (BID) | INTRAMUSCULAR | Status: DC
  Administered 2015-06-03 – 2015-06-07 (×5): 3 mL via INTRAVENOUS

## 2015-06-03 MED ORDER — ZOLPIDEM TARTRATE 5 MG PO TABS
5.0000 mg | ORAL_TABLET | Freq: Every day | ORAL | Status: DC
  Administered 2015-06-03 – 2015-06-08 (×7): 5 mg via ORAL
  Filled 2015-06-03 (×7): qty 1

## 2015-06-03 MED ORDER — ONDANSETRON HCL 4 MG PO TABS
4.0000 mg | ORAL_TABLET | Freq: Three times a day (TID) | ORAL | Status: DC | PRN
Start: 2015-06-03 — End: 2015-06-05
  Administered 2015-06-05: 4 mg via ORAL
  Filled 2015-06-03: qty 1

## 2015-06-03 MED ORDER — URSODIOL 300 MG PO CAPS
300.0000 mg | ORAL_CAPSULE | Freq: Three times a day (TID) | ORAL | Status: DC
  Administered 2015-06-03 – 2015-06-09 (×17): 300 mg via ORAL
  Filled 2015-06-03 (×22): qty 1

## 2015-06-03 MED ORDER — ALBUMIN HUMAN 25 % IV SOLN
12.5000 g | Freq: Once | INTRAVENOUS | Status: AC
Start: 2015-06-03 — End: 2015-06-03
  Administered 2015-06-03: 12.5 g via INTRAVENOUS
  Filled 2015-06-03: qty 50

## 2015-06-03 MED ORDER — SODIUM CHLORIDE 0.9 % IV SOLN: 250.0000 mL | INTRAVENOUS | Status: DC | PRN

## 2015-06-03 MED ORDER — LEVOTHYROXINE SODIUM 75 MCG PO TABS
75.0000 ug | ORAL_TABLET | Freq: Every day | ORAL | Status: DC
  Administered 2015-06-03 – 2015-06-09 (×7): 75 ug via ORAL
  Filled 2015-06-03 (×7): qty 1

## 2015-06-03 MED ORDER — ADULT MULTIVITAMIN W/MINERALS CH
1.0000 | ORAL_TABLET | Freq: Every day | ORAL | Status: DC
  Administered 2015-06-03 – 2015-06-09 (×7): 1 via ORAL
  Filled 2015-06-03 (×7): qty 1

## 2015-06-03 MED ORDER — SODIUM CHLORIDE 0.9 % IJ SOLN: 3.0000 mL | INTRAMUSCULAR | Status: DC | PRN

## 2015-06-03 MED ORDER — FUROSEMIDE 10 MG/ML IJ SOLN
40.0000 mg | Freq: Two times a day (BID) | INTRAMUSCULAR | Status: DC
  Administered 2015-06-03 – 2015-06-06 (×8): 40 mg via INTRAVENOUS
  Filled 2015-06-03 (×8): qty 4

## 2015-06-03 MED ORDER — TRAMADOL HCL 50 MG PO TABS
50.0000 mg | ORAL_TABLET | Freq: Four times a day (QID) | ORAL | Status: DC | PRN
  Administered 2015-06-03 – 2015-06-09 (×8): 50 mg via ORAL
  Filled 2015-06-03 (×8): qty 1

## 2015-06-03 MED ORDER — SPIRONOLACTONE 25 MG PO TABS
100.0000 mg | ORAL_TABLET | Freq: Every day | ORAL | Status: DC
  Administered 2015-06-03 – 2015-06-09 (×7): 100 mg via ORAL
  Filled 2015-06-03 (×7): qty 4

## 2015-06-03 MED ORDER — LORAZEPAM 0.5 MG PO TABS
0.5000 mg | ORAL_TABLET | ORAL | Status: DC | PRN
  Administered 2015-06-03 – 2015-06-07 (×3): 0.5 mg via ORAL
  Filled 2015-06-03 (×4): qty 1

## 2015-06-03 MED ORDER — NYSTATIN 100000 UNIT/ML MT SUSP
5.0000 mL | Freq: Every day | OROMUCOSAL | Status: DC
  Administered 2015-06-03 – 2015-06-09 (×7): 500000 [IU] via ORAL
  Filled 2015-06-03 (×7): qty 5

## 2015-06-03 MED ORDER — LEVETIRACETAM 500 MG PO TABS
500.0000 mg | ORAL_TABLET | Freq: Two times a day (BID) | ORAL | Status: DC
  Administered 2015-06-03 – 2015-06-09 (×14): 500 mg via ORAL
  Filled 2015-06-03 (×14): qty 1

## 2015-06-03 MED ORDER — ALBUMIN HUMAN 25 % IV SOLN
50.0000 g | Freq: Every day | INTRAVENOUS | Status: AC
  Administered 2015-06-04 – 2015-06-06 (×3): 50 g via INTRAVENOUS
  Filled 2015-06-03 (×5): qty 200

## 2015-06-03 MED ORDER — LACTULOSE 10 GM/15ML PO SOLN
20.0000 g | Freq: Three times a day (TID) | ORAL | Status: DC
  Administered 2015-06-03 – 2015-06-06 (×6): 20 g via ORAL
  Filled 2015-06-03 (×11): qty 30

## 2015-06-03 MED ORDER — SODIUM CHLORIDE 0.9 % IJ SOLN
3.0000 mL | Freq: Two times a day (BID) | INTRAMUSCULAR | Status: DC
  Administered 2015-06-03 – 2015-06-08 (×12): 3 mL via INTRAVENOUS

## 2015-06-03 NOTE — Care Management Note (Signed)
Case Management Note  Patient Details  Name: Cynthia Stephenson MRN: [id number] Date of Birth: Oct 19, 1966  Subjective/Objective:                  Pt admitted from home with cirrhosis and edema. Pt lives with her husband and will return home at discharge. Pt is undergoing testing at [facility] for liver transplant.  Action/Plan: Pt stated that her PCP is scheduling pt for sleep study. No CM needs anticipated.  Expected Discharge Date:                  Expected Discharge Plan:  Home/Self Care  In-House Referral:  NA  Discharge planning Services  CM Consult  Post Acute Care Choice:  NA Choice offered to:  NA  DME Arranged:    DME Agency:     HH Arranged:    HH Agency:     Status of Service:  Completed, signed off  Medicare Important Message Given:    Date Medicare IM Given:    Medicare IM give by:    Date Additional Medicare IM Given:    Additional Medicare Important Message give by:     If discussed at Long Length of Stay Meetings, dates discussed:    Additional Comments:  Cheryl Flash, RN 06/03/2015, 11:10 AM

## 2015-06-03 NOTE — Progress Notes (Signed)
*  PRELIMINARY RESULTS* Echocardiogram 2D Echocardiogram has been performed.  Cynthia Stephenson 06/03/2015, 2:15 PM

## 2015-06-03 NOTE — Progress Notes (Signed)
[facility] PROGRESS NOTE  Cynthia Stephenson EGB:[id number] DOB: [birth year] DOA: 06/02/2015 PCP: [name], MD  Assessment/Plan: Decompensation of cirrhosis of liver with volume overload. Hx of same. Secondary to non-alcoholic fatty liver disease. Chart review indicates MELD score 28 and currently being evaluated at [facility] (DR Jason Coop) for liver transplant.  Reports feeling better now than on admission. AST elevated, albumin low and total bili 9.8. IV lasix started. Will add albumin. Will request GI assistance. Of note. Chart review indicates at last hospitalization it was recommended that if oral diuresis not keeping her compensated monthly hospitalizations may be needed for lasix drip and IV albumin. Continue lactulose Active Problems:   Dyspnea: related to above. Improved this am. Vascular congestion and interstitial edema on xray. Oxygen saturation level 98% on room air.     Edema/Anasarca: reports compliance with medications. Await echo results. Recent right heart cath at [facility] as noted below.  Secondary to above. See plan #1.   Hyponatremia: likely related to volume in setting of #1. Aymptomatic.  Diuresis as noted above with albumin. Serum osmolality pending. Urine osmolality pending. Urine sodium within limits of normal    Thrombocytopenia: related to above. Current level close to baseline. No s/sx bleeding    Anemia of chronic disease: appears stable monitor  Cirrhosis: being evaluated by [facility] for transplant. In that process recently underwent right heart cath wich revealed no angiograpically apparent CAD , elevated L ventricular pressure.      Dr Karilyn Cota Code Status: full Family Communication: none present Disposition Plan: home when ready   Consultants:  GI  Procedures:  echo  Antibiotics:  none  HPI/Subjective: Reports feeling and breathing better  Objective: Filed Vitals:   06/03/15 1318  BP: 120/39  Pulse: 91  Temp: 98.1 F (36.7 C)  Resp: 20     Intake/Output Summary (Last 24 hours) at 06/03/15 1358 Last data filed at 06/03/15 1200  Gross per 24 hour  Intake    600 ml  Output   1900 ml  Net  -1300 ml   Filed Weights   06/02/15 2113 06/03/15 0009  Weight: 112.946 kg (249 lb) 111.857 kg (246 lb 9.6 oz)    Exam:   General:  Obese appears comfortable  Cardiovascular: RRR 1-2+LEedema  Respiratory: mild increased work of breathing faint crackles no wheeze  Abdomen: obese mild distention but not tense no wave +BS  Musculoskeletal: no clubbing or cyanosis   Data Reviewed: Basic Metabolic Panel:  Recent Labs Lab 06/02/15 2115 06/03/15 0606  NA 125* 126*  K 4.8 4.7  CL 94* 95*  CO2 22 22  GLUCOSE 118* 85  BUN 24* 24*  CREATININE 0.93 0.82  CALCIUM 8.7* 8.5*   Liver Function Tests:  Recent Labs Lab 06/02/15 2115 06/03/15 0606  AST 81* 74*  ALT 46 43  ALKPHOS 117 109  BILITOT 9.8* 9.0*  PROT 6.4* 6.0*  ALBUMIN 3.2* 3.0*   No results for input(s): LIPASE, AMYLASE in the last 168 hours. No results for input(s): AMMONIA in the last 168 hours. CBC:  Recent Labs Lab 06/02/15 2115 06/03/15 0606  WBC 9.4 7.6  NEUTROABS 6.2  --   HGB 10.1* 9.5*  HCT 28.9* 27.0*  MCV 97.3 96.8  PLT 114* 99*   Cardiac Enzymes: No results for input(s): CKTOTAL, CKMB, CKMBINDEX, TROPONINI in the last 168 hours. BNP (last 3 results)  Recent Labs  03/19/15 1340 05/23/15 0656 06/02/15 2115  BNP 158.0* 101.0* 291.0*    ProBNP (last 3 results)  No results for input(s): PROBNP in the last 8760 hours.  CBG: No results for input(s): GLUCAP in the last 168 hours.  No results found for this or any previous visit (from the past 240 hour(s)).   Studies: Dg Chest Portable 1 View  06/02/2015   CLINICAL DATA:  Acute onset of shortness of breath. Lower extremity swelling. Initial encounter.  EXAM: PORTABLE CHEST - 1 VIEW  COMPARISON:  Chest radiograph performed 05/23/2015  FINDINGS: The lungs are well-aerated. Vascular  congestion is noted. Mild bibasilar opacities likely reflect mild interstitial edema. There is no evidence of pleural effusion or pneumothorax.  The cardiomediastinal silhouette is borderline normal in size. No acute osseous abnormalities are seen.  IMPRESSION: Vascular congestion noted. Mild bibasilar opacities likely reflect mild interstitial edema.   Electronically Signed   By: Roanna Raider M.D.   On: 06/02/2015 22:13    Scheduled Meds: . furosemide  40 mg Intravenous BID  . lactulose  20 g Oral TID  . levETIRAcetam  500 mg Oral BID  . levothyroxine  75 mcg Oral QAC breakfast  . multivitamin with minerals  1 tablet Oral Daily  . nystatin  5 mL Oral Daily  . pantoprazole  40 mg Oral QAC breakfast  . sodium chloride  3 mL Intravenous Q12H  . sodium chloride  3 mL Intravenous Q12H  . spironolactone  100 mg Oral Daily  . ursodiol  300 mg Oral TID  . zolpidem  5 mg Oral QHS   Continuous Infusions:   Principal Problem:     Time spent: 35 minutes    [facility] M  [facility] Pager [phone]. If 7PM-7AM, please contact night-coverage at [email or web address], password [facility] 06/03/2015, 1:58 PM  LOS: 1 day

## 2015-06-03 NOTE — Progress Notes (Signed)
GI attending note; Patient is 49 year old Caucasian female who has advanced cirrhosis secondary to NAFLD complicated by fluid overload and cholestasis who was hospitalized last month and again last week presents with shortness of breath. Chest film on admission revealed vascular congestion and she is responding to IV diarrhetic. About 4 weeks ago patient was diuresed down to 229 pounds prior to discharge on 05/04/2015 and at home she lost another 14 pounds. She developed extreme weakness and hyponatremia and diarrhetic therapy was dialed down. Patient has gained 15 pounds in less than 2 weeks. Diarrhetic was withheld because she had dye study at [facility]. Exam is pertinent for scleral icteric edema involving upper and lower extremities for but non-tense abdomen.  Assessment; Fluid overload is in the form of anasarca secondary to advanced cirrhosis. She does not have significant ascites that would amenable to paracentesis.  No evidence of cardiac disease based on echo from 03/20/2015 and earlier today. She has responded quite well to IV albumin and diarrhetic therapy in the past. She appears to be compliant with salt restriction but would benefit from reevaluation by dietitian. Patient is in need of liver transplant but she is not listed yet.  Recommendations; Dietary consultation. 2 g sodium diet while hospitalized. Albumin 50 g IV daily 3 doses Continue IV furosemide and by mouth spironolactone. Daily metabolic 7.

## 2015-06-03 NOTE — Progress Notes (Signed)
UR chart review completed.  

## 2015-06-04 LAB — COMPREHENSIVE METABOLIC PANEL
ALBUMIN: 3.5 g/dL (ref 3.5–5.0)
ALK PHOS: 100 U/L (ref 38–126)
ALT: 40 U/L (ref 14–54)
AST: 71 U/L — ABNORMAL HIGH (ref 15–41)
Anion gap: 9 (ref 5–15)
BUN: 26 mg/dL — ABNORMAL HIGH (ref 6–20)
CO2: 23 mmol/L (ref 22–32)
Calcium: 8.5 mg/dL — ABNORMAL LOW (ref 8.9–10.3)
Chloride: 92 mmol/L — ABNORMAL LOW (ref 101–111)
Creatinine, Ser: 0.8 mg/dL (ref 0.44–1.00)
GFR calc non Af Amer: >60 mL/min (ref >60)
Glucose, Bld: 108 mg/dL — ABNORMAL HIGH (ref 65–99)
Potassium: 4.6 mmol/L (ref 3.5–5.1)
SODIUM: 124 mmol/L — AB (ref 135–145)
TOTAL PROTEIN: 6.4 g/dL — AB (ref 6.5–8.1)
Total Bilirubin: 9.3 mg/dL — ABNORMAL HIGH (ref 0.3–1.2)

## 2015-06-04 LAB — CBC
HEMATOCRIT: 25.3 % — AB (ref 36.0–46.0)
Hemoglobin: 9.1 g/dL — ABNORMAL LOW (ref 12.0–15.0)
MCH: 34.7 pg — ABNORMAL HIGH (ref 26.0–34.0)
MCHC: 36 g/dL (ref 30.0–36.0)
MCV: 96.6 fL (ref 78.0–100.0)
Platelets: 89 10*3/uL — ABNORMAL LOW (ref 150–400)
RBC: 2.62 MIL/uL — ABNORMAL LOW (ref 3.87–5.11)
RDW: 18.4 % — ABNORMAL HIGH (ref 11.5–15.5)
WBC: 6.8 10*3/uL (ref 4.0–10.5)

## 2015-06-04 NOTE — Progress Notes (Signed)
Since pt is refusing telemetry, MD states it is fine to D/C.

## 2015-06-04 NOTE — Progress Notes (Signed)
[facility] PROGRESS NOTE  Cynthia Stephenson ZOX:[id number] DOB: December 01, 1966 DOA: 06/02/2015 PCP: [name], MD    Code Status: Full code Family Communication: Discussed with patient; family not available Disposition Plan: Discharge when clinically appropriate   Consultants:  Gastroenterology, Dr. Karilyn Cota  Procedures:  None  Antibiotics:  None  HPI/Subjective: The patient says that she is breathing better. She is complaining about change in her diet to a 2 g sodium diet. She also complains of chronic cramping in her hands and her legs. She says mustard generally helps.  Objective: Filed Vitals:   06/04/15 1025  BP: 113/56  Pulse:   Temp:   Resp:    temperature 97.7. Pulse 92. Respiratory rate 20. Blood pressure 113/56. Oxygen saturation 100% on room air.  Intake/Output Summary (Last 24 hours) at 06/04/15 1228 Last data filed at 06/04/15 0800  Gross per 24 hour  Intake    480 ml  Output   1600 ml  Net  -1120 ml   Filed Weights   06/02/15 2113 06/03/15 0009 06/04/15 0500  Weight: 112.946 kg (249 lb) 111.857 kg (246 lb 9.6 oz) 110.542 kg (243 lb 11.2 oz)    Exam:   General:  Obese 49 year old woman in no acute distress.  Eyes: Positive for scleral icterus.  Cardiovascular: S1, S2, with soft systolic murmur.  Respiratory: Breathing mildly labored at rest (question if this is chronic). Decreased breath sound in the bases, but no audible wheezes or crackles.  Abdomen: Obese, positive bowel sounds, mild fluid wave, nontender, nondistended.  Musculoskeletal/extremities: Trace to 1+ bilateral lower extremity edema, nonpitting.  Neurologic: She is alert and oriented 3. Her speech is clear.   Skin: Mild generalized jaundice.  Data Reviewed: Basic Metabolic Panel:  Recent Labs Lab 06/02/15 2115 06/03/15 0606 06/04/15 1030  NA 125* 126* 124*  K 4.8 4.7 4.6  CL 94* 95* 92*  CO2 GLUCOSE 118* 85 108*  BUN 24* 24* 26*  CREATININE 0.93 0.82  0.80  CALCIUM 8.7* 8.5* 8.5*   Liver Function Tests:  Recent Labs Lab 06/02/15 2115 06/03/15 0606 06/04/15 1030  AST 81* 74* 71*  ALT 46 43 40  ALKPHOS 117 109 100  BILITOT 9.8* 9.0* 9.3*  PROT 6.4* 6.0* 6.4*  ALBUMIN 3.2* 3.0* 3.5   No results for input(s): LIPASE, AMYLASE in the last 168 hours. No results for input(s): AMMONIA in the last 168 hours. CBC:  Recent Labs Lab 06/02/15 2115 06/03/15 0606 06/04/15 1030  WBC 9.4 7.6 6.8  NEUTROABS 6.2  --   --   HGB 10.1* 9.5* 9.1*  HCT 28.9* 27.0* 25.3*  MCV 97.3 96.8 96.6  PLT 114* 99* 89*   Cardiac Enzymes: No results for input(s): CKTOTAL, CKMB, CKMBINDEX, TROPONINI in the last 168 hours. BNP (last 3 results)  Recent Labs  03/19/15 1340 05/23/15 0656 06/02/15 2115  BNP 158.0* 101.0* 291.0*    ProBNP (last 3 results) No results for input(s): PROBNP in the last 8760 hours.  CBG: No results for input(s): GLUCAP in the last 168 hours.  No results found for this or any previous visit (from the past 240 hour(s)).   Studies: Dg Chest Portable 1 View  06/02/2015   CLINICAL DATA:  Acute onset of shortness of breath. Lower extremity swelling. Initial encounter.  EXAM: PORTABLE CHEST - 1 VIEW  COMPARISON:  Chest radiograph performed 05/23/2015  FINDINGS: The lungs are well-aerated. Vascular congestion is noted. Mild bibasilar opacities likely reflect mild interstitial edema. There  is no evidence of pleural effusion or pneumothorax.  The cardiomediastinal silhouette is borderline normal in size. No acute osseous abnormalities are seen.  IMPRESSION: Vascular congestion noted. Mild bibasilar opacities likely reflect mild interstitial edema.   Electronically Signed   By: Roanna Raider M.D.   On: 06/02/2015 22:13    Scheduled Meds: . albumin human  50 g Intravenous Daily  . furosemide  40 mg Intravenous BID  . lactulose  20 g Oral TID  . levETIRAcetam  500 mg Oral BID  . levothyroxine  75 mcg Oral QAC breakfast  .  multivitamin with minerals  1 tablet Oral Daily  . nystatin  5 mL Oral Daily  . pantoprazole  40 mg Oral QAC breakfast  . sodium chloride  3 mL Intravenous Q12H  . sodium chloride  3 mL Intravenous Q12H  . spironolactone  100 mg Oral Daily  . ursodiol  300 mg Oral TID  . zolpidem  5 mg Oral QHS   Continuous Infusions:    Assessment and plan: Principal Problem:   Dyspnea Active Problems:   Decompensation of cirrhosis of liver   Anasarca   Pulmonary edema, noncardiac   Hypothyroidism   Cirrhosis of liver with ascites   Thrombocytopenia   Hyponatremia   Anemia of chronic disease   Obesity, morbid  1. Noncardiogenic pulmonary edema, secondary to decompensated cirrhosis causing dyspnea. On admission, the patient was noted to have more generalized body edema. Her chest x-ray revealed vascular congestion and mild bibasal opacities, likely reflecting mild interstitial edema. Her BNP was only 291. She was started on IV Lasix and continued on spironolactone. Her ins/outs are -2.4 L so far. She does have less referral edema. 2-D echocardiogram was ordered for further evaluation. They revealed mild LVH, EF of 65-70%, and mild pulmonary hypertension. No mention of diastolic dysfunction. We'll continue current treatment.  Decompensated NASH cirrhosis causing anasarca. The patient was just discharged 2 weeks ago for the same. As above, she was started on IV Lasix. Gastronenterologist, Dr. Karilyn Cota was consulted and ordered albumin 50 g daily 3 days. Apparently, a combination of albumin infusion and Lasix helps with better diuresis in this particular patient. Apparently, diuretic therapy was held following the recent cardiac catheterization at [facility]. We'll also continue spironolactone and lactulose. Actigall was added per GI. She does not appear to have significant ascites, so we'll hold off on paracentesis for now. -Her diet was changed to 2 g sodium diet by Dr. Karilyn Cota. The patient was  not happy with this change. -The patient is followed by the transplant team at [facility]. She recently underwent a right heart cardiac catheterization and it revealed no apparent CAD and elevated left ventricular pressure.  Chronic hyponatremia. The patient's serum sodium has ranged from 124-125, secondary to chronic cirrhosis. Her TSH was within normal limits. Her cortisol level was on the low end of normal. Would consider holding the spironolactone or decreasing the Lasix, but will discuss this with GI first. We'll continue to monitor this.  Cholestasis and hyperbilirubinemia, secondary to cirrhosis. The patient's bilirubin was 8.6 on admission, but has been as high as greater than 10 in the recent past. -Actigall added per GI.  Chronic anemia and thrombocytopenia, secondary to cirrhosis and likely splenomegaly; chronic disease. The patient's platelet count has been historically low. It was 114 on admission and has drifted down some. We'll order an anemia panel for tomorrow morning. No active signs of bleeding. This will continue to be monitored closely.  Chronic musculoskeletal cramping. We'll allow no more than 3 packets of mustard protocol request although the patient is on a low-sodium diet.     Time spent: 35 minutes    [facility]  [facility] Pager [phone]. If 7PM-7AM, please contact night-coverage at [email or web address], password [facility] 06/04/2015, 12:28 PM  LOS: 2 days

## 2015-06-04 NOTE — Progress Notes (Signed)
Notified by central telemetry that pt was off the monitor. I went to check on pt and she said she pulled them off because the electrodes were itchy and she refused to put it back on. MD notified, will follow orders as advised.

## 2015-06-04 NOTE — Plan of Care (Signed)
Problem: Food- and Nutrition-Related Knowledge Deficit (NB-1.1) Goal: Nutrition education Formal process to instruct or train a patient/client in a skill or to impart knowledge to help patients/clients voluntarily manage or modify food choices and eating behavior to maintain or improve health. Outcome: Completed/Met Date Met:  06/04/15  RD consulted for nutrition education regarding weight loss and a low sodium diet (2 g Na).  Body mass index is 44.56 kg/(m^2). Pt meets criteria for Morbidly obeses based on current BMI.  Went over pt's dietary recall. She States for breakfast she likes egg on low sodium bread with some low sodium mayo/ketchup. She will drink juice with this. For lunch she will eat something like a grilled chicken sandwich or chicken salad. For dinner, her meals typically contain a meat, starch vegetable. She reports seldom eating out and when she does she will eat a salad or grilled chicken sandwich. She reports drinking 3-4 lemonades a day in addition to the juice in the morning.   RD provided "My Plate" template. Explained that ideally her plate would contain 1/2 vegetable+fruit, 1/4 low sodium protein and 1/4 lower sodium carb/starch.  Emphasized eating correct portions of of the types of foods-which is illustrated in the My Plate Template.   We went over some basic healthy eating tips. Advised her to switch to whole grains/pasta/rice etc. She states she eats some whole grains and is trying to switch completely. Her biggest obstacle is the sugar sweetened beverages. I reccommended drinking water, diet drinks, unsweetened tea in place of her lemonade juice. She did not seem willing in the least to give these up.    Encouraged pt to do what physical activity she is able. Pt reports she has done simple physical activity in the past, but has been unable to recently d/t labored breathing. She also admits that she has been eating excess calories recently because thing have "been hectic  recently" and because of "Father's Day" .    From a sodium standpoint her diet does not seem to bad. She reports avoiding canned items, lunch meats, regular bacon or sausage (does sometimes eat the [place] kinds), frozen meals, salt-containing seasonings.  I advised her to keep a diary of how much sodium she eat. She reports she has in the past and it was usually below 2 grams.   Expect Fair compliance. Her biggest trouble will be the sugar sweetened beverages and, from her reaction when I mentioned cutting back on them, she is not ready to do so at this point.   Current diet order is 2 g sodium, patient is consuming approximately 75-100% of meals at this time. Labs and medications reviewed. No further nutrition interventions warranted at this time.If additional nutrition issues arise, please re-consult RD.   Burtis Junes RD, LDN Nutrition Pager: [phone] 06/04/2015 2:46 PM

## 2015-06-04 NOTE — Progress Notes (Signed)
  Subjective:  Patient complains of feeling weak and nervous. She is not short of breath anymore. She is passing more urine that she has been at home. She is wondering if her ammonia is elevated. Her bowels are moving. She denies melena or rectal bleeding.   Objective: Blood pressure 123/43, pulse 91, temperature 98.2 F (36.8 C), temperature source Oral, resp. rate 20, height 5\' 2"  (1.575 m), weight 243 lb 11.2 oz (110.542 kg), SpO2 97 %. Patient is alert. She has tremors but no asterixis. Conjunctiva is pink and sclera is icteric. Cardiac exam reveals grade 2/6 systolic ejection murmur best heard at left sternal border. Lungs are clear to auscultation. Abdomen is full but not tense.  She has 2+ pitting edema involving both legs.  Labs/studies Results:   Recent Labs  06/02/15 2115 06/03/15 0606 06/04/15 1030  WBC 9.4 7.6 6.8  HGB 10.1* 9.5* 9.1*  HCT 28.9* 27.0* 25.3*  PLT 114* 99* 89*    BMET   Recent Labs  06/02/15 2115 06/03/15 0606 06/04/15 1030  NA 125* 126* 124*  K 4.8 4.7 4.6  CL 94* 95* 92*  CO2 22 22 23   GLUCOSE 118* 85 108*  BUN 24* 24* 26*  CREATININE 0.93 0.82 0.80  CALCIUM 8.7* 8.5* 8.5*    LFT   Recent Labs  06/02/15 2115 06/03/15 0606 06/04/15 1030  PROT 6.4* 6.0* 6.4*  ALBUMIN 3.2* 3.0* 3.5  AST 81* 74* 71*  ALT 46 43 40  ALKPHOS 117 109 100  BILITOT 9.8* 9.0* 9.3*     Assessment:  #1. Fluid overload. She has lost 3 pounds since admission. She is having nice response to IV albumin and diuretic. Dietary consultation by Mr. Vedia Coffer RD appreciated #2. Intrahepatic cholestasis secondary to advanced cirrhosis. #3. Weakness and fatigue is multifactorial. #4. Hyponatremia appears to be dilutional given low serum osmolality. Hyponatremia appears to have been made worse by diuretic therapy and underlying liver disease.  Recommendations;  Continue IV albumin and Lasix for 2 more days. Metabolic 7 and serum ammonia in  a.m.

## 2015-06-05 ENCOUNTER — Inpatient Hospital Stay (HOSPITAL_COMMUNITY): Payer: BLUE CROSS/BLUE SHIELD

## 2015-06-05 DIAGNOSIS — D638 Anemia in other chronic diseases classified elsewhere: Secondary | ICD-10-CM

## 2015-06-05 LAB — CBC
HEMATOCRIT: 25.8 % — AB (ref 36.0–46.0)
HEMOGLOBIN: 9.2 g/dL — AB (ref 12.0–15.0)
MCH: 34.7 pg — ABNORMAL HIGH (ref 26.0–34.0)
MCHC: 35.7 g/dL (ref 30.0–36.0)
MCV: 97.4 fL (ref 78.0–100.0)
PLATELETS: 98 10*3/uL — AB (ref 150–400)
RBC: 2.65 MIL/uL — AB (ref 3.87–5.11)
RDW: 18.6 % — ABNORMAL HIGH (ref 11.5–15.5)
WBC: 6.1 10*3/uL (ref 4.0–10.5)

## 2015-06-05 LAB — BASIC METABOLIC PANEL
ANION GAP: 8 (ref 5–15)
BUN: 26 mg/dL — AB (ref 6–20)
CALCIUM: 8.6 mg/dL — AB (ref 8.9–10.3)
CO2: 24 mmol/L (ref 22–32)
CREATININE: 0.74 mg/dL (ref 0.44–1.00)
Chloride: 93 mmol/L — ABNORMAL LOW (ref 101–111)
Glucose, Bld: 98 mg/dL (ref 65–99)
Potassium: 4.9 mmol/L (ref 3.5–5.1)
Sodium: 125 mmol/L — ABNORMAL LOW (ref 135–145)

## 2015-06-05 LAB — IRON AND TIBC
Iron: 135 ug/dL (ref 28–170)
Saturation Ratios: 62 % — ABNORMAL HIGH (ref 10.4–31.8)
TIBC: 218 ug/dL — AB (ref 250–450)
UIBC: 83 ug/dL

## 2015-06-05 LAB — AMMONIA: AMMONIA: 31 umol/L (ref 9–35)

## 2015-06-05 LAB — FOLATE: Folate: 19.5 ng/mL (ref >5.9)

## 2015-06-05 LAB — VITAMIN B12: Vitamin B-12: 814 pg/mL (ref 180–914)

## 2015-06-05 LAB — FERRITIN: FERRITIN: 423 ng/mL — AB (ref 11–307)

## 2015-06-05 MED ORDER — ONDANSETRON HCL 4 MG/2ML IJ SOLN
4.0000 mg | Freq: Four times a day (QID) | INTRAMUSCULAR | Status: DC | PRN
  Administered 2015-06-05 – 2015-06-09 (×3): 4 mg via INTRAVENOUS
  Filled 2015-06-05 (×3): qty 2

## 2015-06-05 NOTE — Progress Notes (Signed)
Patient request more nausea medication since she continues to be nauseated.  Notified Dr. Sherrie Mustache and recommended IV form since the po was not working well.  We will continue to moniter and call MD back if med does not help.   Also Late Entry:  Dr. Karilyn Cota was notified the the patient has been skipping doses of her Lactulose.  He verbalized understanding.   I talked with the patient about the Lactulose dose and she stated according to her [facility] MD this is the only medication that she can adjust.

## 2015-06-05 NOTE — Care Management Note (Signed)
Case Management Note  Patient Details  Name: Cynthia Stephenson MRN: [id number] Date of Birth: December 30, 1965  Subjective/Objective:                    Action/Plan:   Expected Discharge Date:                  Expected Discharge Plan:  Home/Self Care  In-House Referral:  NA  Discharge planning Services  CM Consult  Post Acute Care Choice:  NA Choice offered to:  NA  DME Arranged:    DME Agency:     HH Arranged:    HH Agency:     Status of Service:  Completed, signed off  Medicare Important Message Given:    Date Medicare IM Given:    Medicare IM give by:    Date Additional Medicare IM Given:    Additional Medicare Important Message give by:     If discussed at Long Length of Stay Meetings, dates discussed:    Additional Comments: Anticipate discharge within 48 hours. No CM needs noted. If any CM needs arise, bedside RN can arrange. Arlyss Queen Weatherford, RN 06/05/2015, 11:17 AM

## 2015-06-05 NOTE — Progress Notes (Addendum)
Patient ID: Cynthia Stephenson, female   DOB: [birth year], 49 y.o.   MRN: [id number] States she did not sleep very well last night. She is not as SOB.  She says her appetite is not good. She ate all of her breakfast.  She has lost one pound since yesterday.  She had 5 stools yesterday and were loose. Ammonia today 21 I/O last 3 completed shifts: In: 720 [P.O.:720] Out: 1200 [Urine:1200]   .   Marland KitchenHepatic Function Panel     Component Value Date/Time   PROT 6.4* 06/04/2015 1030   ALBUMIN 3.5 06/04/2015 1030   AST 71* 06/04/2015 1030   ALT 40 06/04/2015 1030   ALKPHOS 100 06/04/2015 1030   BILITOT 9.3* 06/04/2015 1030   BILIDIR 5.1* 05/27/2015 1133   IBILI 3.5* 05/27/2015 1133    CBC    Component Value Date/Time   WBC 6.1 06/05/2015 0609   RBC 2.65* 06/05/2015 0609   HGB 9.2* 06/05/2015 0609   HCT 25.8* 06/05/2015 0609   PLT 98* 06/05/2015 0609   MCV 97.4 06/05/2015 0609   MCH 34.7* 06/05/2015 0609   MCHC 35.7 06/05/2015 0609   RDW 18.6* 06/05/2015 0609   LYMPHSABS 1.9 06/02/2015 2115   MONOABS 1.0 06/02/2015 2115   EOSABS 0.3 06/02/2015 2115   BASOSABS 0.1 06/02/2015 2115   Filed Vitals:   06/05/15 0634  BP: 110/32  Pulse: 79  Temp: 97.7 F (36.5 C)  Resp: 20   Alert, sclera icteric. HR regular. Murmur heard. Abdomen soft, BS+. 2+ edema to lower extremities. Assessment: End stage liver disease. Will receive Albumin today and Lasix. Dr. Karilyn Cota has call in to [facility].   GI attending note; Patient is getting read to receive IV albumin which was delayed because she had to go down to get chest film. She was complaining of shortness of breath but chest film is negative for infiltrate or effusions. Shows prominent interstitial/reticular pattern. Discussed with Dr. Julieta Gutting, patient's transplant hepatologist. He states all the data has been submitted and she should be listed any movement.  Dr. Jena Gauss will be seeing patient over the weekend for GI issues.

## 2015-06-05 NOTE — Progress Notes (Signed)
[facility] PROGRESS NOTE  Cynthia Stephenson TFT:[id number] DOB: March 07, 1966 DOA: 06/02/2015 PCP: [name], MD    Code Status: Full code Family Communication: Discussed with patient; family not available Disposition Plan: Discharge when clinically appropriate   Consultants:  Gastroenterology, Dr. Karilyn Cota  Procedures:  None  Antibiotics:  None  HPI/Subjective: Patient says that she is breathing about the same, no better or no worse. No complaints of chest pain or abdominal pain.  Objective: Filed Vitals:   06/05/15 0634  BP: 110/32  Pulse: 79  Temp: 97.7 F (36.5 C)  Resp: 20   oxygen saturation 99%.  Intake/Output Summary (Last 24 hours) at 06/05/15 1244 Last data filed at 06/05/15 0823  Gross per 24 hour  Intake    720 ml  Output    400 ml  Net    320 ml   Filed Weights   06/03/15 0009 06/04/15 0500 06/05/15 0634  Weight: 111.857 kg (246 lb 9.6 oz) 110.542 kg (243 lb 11.2 oz) 110.179 kg (242 lb 14.4 oz)    Exam:   General:  Obese 49 year old woman in no acute distress.  Eyes: Positive for scleral icterus.  Cardiovascular: S1, S2, with soft systolic murmur.  Respiratory: Breathing mildly labored at rest (question if this is chronic). Coarse breath sounds, but no audible wheezes or crackles.  Abdomen: Obese, positive bowel sounds, mild fluid wave, nontender, nondistended.  Musculoskeletal/extremities: Trace to 1+ bilateral lower extremity edema, nonpitting.  Neurologic: She is alert and oriented 3. Her speech is clear.   Skin: Mild generalized jaundice.  Neurologic: She is alert and oriented 3. Cranial nerves II through XII are intact.  Data Reviewed: Basic Metabolic Panel:  Recent Labs Lab 06/02/15 2115 06/03/15 0606 06/04/15 1030 06/05/15 0614  NA 125* 126* 124* 125*  K 4.8 4.7 4.6 4.9  CL 94* 95* 92* 93*  CO2 22 22 23 24   GLUCOSE 118* 85 108* 98  BUN 24* 24* 26* 26*  CREATININE 0.93 0.82 0.80 0.74  CALCIUM 8.7* 8.5* 8.5* 8.6*    Liver Function Tests:  Recent Labs Lab 06/02/15 2115 06/03/15 0606 06/04/15 1030  AST 81* 74* 71*  ALT 46 43 40  ALKPHOS 117 109 100  BILITOT 9.8* 9.0* 9.3*  PROT 6.4* 6.0* 6.4*  ALBUMIN 3.2* 3.0* 3.5   No results for input(s): LIPASE, AMYLASE in the last 168 hours.  Recent Labs Lab 06/05/15 0609  AMMONIA 31   CBC:  Recent Labs Lab 06/02/15 2115 06/03/15 0606 06/04/15 1030 06/05/15 0609  WBC 9.4 7.6 6.8 6.1  NEUTROABS 6.2  --   --   --   HGB 10.1* 9.5* 9.1* 9.2*  HCT 28.9* 27.0* 25.3* 25.8*  MCV 97.3 96.8 96.6 97.4  PLT 114* 99* 89* 98*   Cardiac Enzymes: No results for input(s): CKTOTAL, CKMB, CKMBINDEX, TROPONINI in the last 168 hours. BNP (last 3 results)  Recent Labs  03/19/15 1340 05/23/15 0656 06/02/15 2115  BNP 158.0* 101.0* 291.0*    ProBNP (last 3 results) No results for input(s): PROBNP in the last 8760 hours.  CBG: No results for input(s): GLUCAP in the last 168 hours.  No results found for this or any previous visit (from the past 240 hour(s)).   Studies: No results found.  Scheduled Meds: . albumin human  50 g Intravenous Daily  . furosemide  40 mg Intravenous BID  . lactulose  20 g Oral TID  . levETIRAcetam  500 mg Oral BID  . levothyroxine  75 mcg Oral  QAC breakfast  . multivitamin with minerals  1 tablet Oral Daily  . nystatin  5 mL Oral Daily  . pantoprazole  40 mg Oral QAC breakfast  . sodium chloride  3 mL Intravenous Q12H  . sodium chloride  3 mL Intravenous Q12H  . spironolactone  100 mg Oral Daily  . ursodiol  300 mg Oral TID  . zolpidem  5 mg Oral QHS   Continuous Infusions:    Assessment and plan: Principal Problem:   Dyspnea Active Problems:   Decompensation of cirrhosis of liver   Anasarca   Pulmonary edema, noncardiac   Hypothyroidism   Cirrhosis of liver with ascites   Thrombocytopenia   Hyponatremia   Anemia of chronic disease   Obesity, morbid  1. Noncardiogenic pulmonary edema, secondary to  decompensated cirrhosis causing dyspnea. On admission, the patient was noted to have more generalized body edema. Her chest x-ray revealed vascular congestion and mild bibasal opacities, likely reflecting mild interstitial edema. Her BNP was only 291. She was started on IV Lasix and continued on spironolactone. Her ins/outs are -2.6 L so far. She does have less referral edema. 2-D echocardiogram was ordered for further evaluation. They revealed mild LVH, EF of 65-70%, and mild pulmonary hypertension. No mention of diastolic dysfunction. We'll continue current treatment. -We'll order a follow-up chest x-ray to assess for interval improvement.  Decompensated NASH cirrhosis causing anasarca. The patient was just discharged 2 weeks ago for the same. As above, she was started on IV Lasix. Gastronenterologist, Dr. Karilyn Cota was consulted and ordered albumin 50 g daily 3 days. Apparently, a combination of albumin infusion and Lasix helps with better diuresis in this particular patient. Apparently, diuretic therapy was held following the recent cardiac catheterization at [facility]. We'll also continue spironolactone and lactulose. Actigall was added per GI. She does not appear to have significant ascites, so we'll hold off on paracentesis for now. Her diet was changed to 2 g sodium diet by Dr. Karilyn Cota. Patient's ammonia level is within normal limits. The patient is followed by the transplant team at [facility]. She recently underwent a right heart cardiac catheterization and it revealed no apparent CAD and elevated left ventricular pressure. -We will discuss disposition with Dr. Karilyn Cota.  Chronic hyponatremia. The patient's serum sodium has ranged from 124-125, secondary to chronic cirrhosis and total body edema. Her TSH was within normal limits. Her cortisol level was on the low end of normal. Would consider holding the spironolactone or decreasing the Lasix, but will discuss this with GI  first. We'll continue to monitor this.  Cholestasis and hyperbilirubinemia, secondary to cirrhosis. The patient's bilirubin was 8.6 on admission, but has been as high as greater than 10 in the recent past. -Actigall added per GI.  Chronic anemia and thrombocytopenia, secondary to cirrhosis and likely splenomegaly; chronic disease. The patient's platelet count has been historically low. It was 114 on admission and has drifted down some. Anemia panel ordered and pending. No active signs of bleeding. This will continue to be monitored closely.  Chronic musculoskeletal cramping. We'll allow no more than 3 packets of mustard protocol request although the patient is on a low-sodium diet.  Nutrition: Registered dietitian consulted and counseled patient on a low-sodium diet.    Time spent: 25  minutes    [facility]  [facility] Pager [phone]. If 7PM-7AM, please contact night-coverage at [email or web address], password [facility] 06/05/2015, 12:44 PM  LOS: 3 days

## 2015-06-06 DIAGNOSIS — K7460 Unspecified cirrhosis of liver: Principal | ICD-10-CM

## 2015-06-06 DIAGNOSIS — R0600 Dyspnea, unspecified: Secondary | ICD-10-CM

## 2015-06-06 LAB — CBC
HCT: 24.3 % — ABNORMAL LOW (ref 36.0–46.0)
Hemoglobin: 8.7 g/dL — ABNORMAL LOW (ref 12.0–15.0)
MCH: 34.4 pg — ABNORMAL HIGH (ref 26.0–34.0)
MCHC: 35.8 g/dL (ref 30.0–36.0)
MCV: 96 fL (ref 78.0–100.0)
PLATELETS: 92 10*3/uL — AB (ref 150–400)
RBC: 2.53 MIL/uL — AB (ref 3.87–5.11)
RDW: 19 % — ABNORMAL HIGH (ref 11.5–15.5)
WBC: 6.1 10*3/uL (ref 4.0–10.5)

## 2015-06-06 LAB — BASIC METABOLIC PANEL
ANION GAP: 9 (ref 5–15)
BUN: 28 mg/dL — ABNORMAL HIGH (ref 6–20)
CHLORIDE: 95 mmol/L — AB (ref 101–111)
CO2: 24 mmol/L (ref 22–32)
CREATININE: 0.84 mg/dL (ref 0.44–1.00)
Calcium: 8.9 mg/dL (ref 8.9–10.3)
GFR calc non Af Amer: >60 mL/min (ref >60)
Glucose, Bld: 86 mg/dL (ref 65–99)
Potassium: 5 mmol/L (ref 3.5–5.1)
SODIUM: 128 mmol/L — AB (ref 135–145)

## 2015-06-06 NOTE — Progress Notes (Signed)
Patient states breathing a little better today. No BM so far today - multiple yesterday; lactulose given this morning. Weight down 7 pounds since his admission. Completing albumen and Lasix today   Vital signs in last 24 hours: Temp:  [98.2 F (36.8 C)-98.7 F (37.1 C)] 98.3 F (36.8 C) (06/25 1100) Pulse Rate:  [80-93] 80 (06/25 1100) Resp:  [20-21] 21 (06/25 1100) BP: (100-121)/(37-53) 121/42 mmHg (06/25 1100) SpO2:  [99 %-100 %] 100 % (06/25 1100) Weight:  [243 lb 9.6 oz (110.496 kg)] 243 lb 9.6 oz (110.496 kg) (06/25 0500) Last BM Date: 06/05/15 General:   Alert,  pleasant and conversant. No acute distress.  She remains jaundiced. Abdomen:  Obese. Positive bowel sounds soft and non-tender.  Extremities:  2+ LE edema.    Intake/Output from previous day: 06/24 0701 - 06/25 0700 In: 1640 [P.O.:1440; IV Piggyback:200] Out: 500 [Urine:500] Intake/Output this shift: Total I/O In: 483 [P.O.:480; I.V.:3] Out: -   Lab Results:  Recent Labs  06/04/15 1030 06/05/15 0609 06/06/15 0444  WBC 6.8 6.1 6.1  HGB 9.1* 9.2* 8.7*  HCT 25.3* 25.8* 24.3*  PLT 89* 98* 92*   BMET  Recent Labs  06/04/15 1030 06/05/15 0614 06/06/15 0444  NA 124* 125* 128*  K 4.6 4.9 5.0  CL 92* 93* 95*  CO2 23 24 24   GLUCOSE 108* 98 86  BUN 26* 26* 28*  CREATININE 0.80 0.74 0.84  CALCIUM 8.5* 8.6* 8.9   LFT  Recent Labs  06/04/15 1030  PROT 6.4*  ALBUMIN 3.5  AST 71*  ALT 40  ALKPHOS 100  BILITOT 9.3*   PT/INR No results for input(s): LABPROT, INR in the last 72 hours. Hepatitis Panel No results for input(s): HEPBSAG, HCVAB, HEPAIGM, HEPBIGM in the last 72 hours. C-Diff No results for input(s): CDIFFTOX in the last 72 hours.  Studies/Results: Dg Chest 2 View  06/05/2015   CLINICAL DATA:  Pulmonary edema  EXAM: CHEST  2 VIEW  COMPARISON:  06/02/2015  FINDINGS: Cardiomediastinal silhouette is stable. No segmental infiltrate. Mild residual interstitial prominence bilaterally without  pulmonary edema.  IMPRESSION: No segmental infiltrate. Minimal reticular interstitial prominence bilaterally without pulmonary edema.   Electronically Signed   By: Natasha Mead M.D.   On: 06/05/2015 14:07    Assessment: Principal Problem:   Dyspnea Active Problems:   Hypothyroidism   Cirrhosis of liver with ascites   Thrombocytopenia   Decompensation of cirrhosis of liver   Hyponatremia   Anemia of chronic disease   Anasarca   Pulmonary edema, noncardiac   Obesity, morbid   Impression:  Cynthia Stephenson cirrhosis with significant third spacing-slowly improving. Little tick-up in BUN noted. Patient overall modestly improved. Transplant listing is imminent based on information we have received from [facility].  Recommendations:  Continue diuretic therapy wit Aldactone and Lasix.   Continue to watch electrolytes closely. Lactulose to be titrated, ideally, for 2-3 semi-formed stools daily.

## 2015-06-06 NOTE — Progress Notes (Signed)
[facility] PROGRESS NOTE  Cynthia Stephenson NID:[id number] DOB: 05-16-1966 DOA: 06/02/2015 PCP: [name], MD    Code Status: Full code Family Communication: Discussed with patient; family not available Disposition Plan: Discharge when clinically appropriate   Consultants:  Gastroenterology, Dr. Karilyn Cota  Procedures:  None  Antibiotics:  None  HPI/Subjective: Patient says that she is breathing about the same. She is concerned that she gained 1 pound since yesterday.  Objective: Filed Vitals:   06/06/15 1100  BP: 121/42  Pulse: 80  Temp: 98.3 F (36.8 C)  Resp: 21   oxygen saturation 99%.  Intake/Output Summary (Last 24 hours) at 06/06/15 1300 Last data filed at 06/06/15 1253  Gross per 24 hour  Intake   1883 ml  Output    500 ml  Net   1383 ml   Filed Weights   06/04/15 0500 06/05/15 0634 06/06/15 0500  Weight: 110.542 kg (243 lb 11.2 oz) 110.179 kg (242 lb 14.4 oz) 110.496 kg (243 lb 9.6 oz)    Exam:   General:  Obese 49 year old woman in no acute distress.  Eyes: Positive for scleral icterus.  Cardiovascular: S1, S2, with soft systolic murmur.  Respiratory: Breathing mildly labored at rest (question if this is chronic). Coarse breath sounds, but no audible wheezes or crackles.  Abdomen: Obese, positive bowel sounds, mild fluid wave, nontender, nondistended.  Musculoskeletal/extremities: Trace to 1+ bilateral lower extremity edema, nonpitting.  Neurologic: She is alert and oriented 3. Her speech is clear.   Skin: Mild generalized jaundice.  Neurologic: She is alert and oriented 3. Cranial nerves II through XII are intact.  Data Reviewed: Basic Metabolic Panel:  Recent Labs Lab 06/02/15 2115 06/03/15 0606 06/04/15 1030 06/05/15 0614 06/06/15 0444  NA 125* 126* 124* 125* 128*  K 4.8 4.7 4.6 4.9 5.0  CL 94* 95* 92* 93* 95*  CO2 22 22 23 24 24   GLUCOSE 118* 85 108* 98 86  BUN 24* 24* 26* 26* 28*  CREATININE 0.93 0.82 0.80 0.74 0.84   CALCIUM 8.7* 8.5* 8.5* 8.6* 8.9   Liver Function Tests:  Recent Labs Lab 06/02/15 2115 06/03/15 0606 06/04/15 1030  AST 81* 74* 71*  ALT 46 43 40  ALKPHOS 117 109 100  BILITOT 9.8* 9.0* 9.3*  PROT 6.4* 6.0* 6.4*  ALBUMIN 3.2* 3.0* 3.5   No results for input(s): LIPASE, AMYLASE in the last 168 hours.  Recent Labs Lab 06/05/15 0609  AMMONIA 31   CBC:  Recent Labs Lab 06/02/15 2115 06/03/15 0606 06/04/15 1030 06/05/15 0609 06/06/15 0444  WBC 9.4 7.6 6.8 6.1 6.1  NEUTROABS 6.2  --   --   --   --   HGB 10.1* 9.5* 9.1* 9.2* 8.7*  HCT 28.9* 27.0* 25.3* 25.8* 24.3*  MCV 97.3 96.8 96.6 97.4 96.0  PLT 114* 99* 89* 98* 92*   Cardiac Enzymes: No results for input(s): CKTOTAL, CKMB, CKMBINDEX, TROPONINI in the last 168 hours. BNP (last 3 results)  Recent Labs  03/19/15 1340 05/23/15 0656 06/02/15 2115  BNP 158.0* 101.0* 291.0*    ProBNP (last 3 results) No results for input(s): PROBNP in the last 8760 hours.  CBG: No results for input(s): GLUCAP in the last 168 hours.  No results found for this or any previous visit (from the past 240 hour(s)).   Studies: Dg Chest 2 View  06/05/2015   CLINICAL DATA:  Pulmonary edema  EXAM: CHEST  2 VIEW  COMPARISON:  06/02/2015  FINDINGS: Cardiomediastinal silhouette is stable. No  segmental infiltrate. Mild residual interstitial prominence bilaterally without pulmonary edema.  IMPRESSION: No segmental infiltrate. Minimal reticular interstitial prominence bilaterally without pulmonary edema.   Electronically Signed   By: Natasha Mead M.D.   On: 06/05/2015 14:07    Scheduled Meds: . furosemide  40 mg Intravenous BID  . lactulose  20 g Oral TID  . levETIRAcetam  500 mg Oral BID  . levothyroxine  75 mcg Oral QAC breakfast  . multivitamin with minerals  1 tablet Oral Daily  . nystatin  5 mL Oral Daily  . pantoprazole  40 mg Oral QAC breakfast  . sodium chloride  3 mL Intravenous Q12H  . sodium chloride  3 mL Intravenous Q12H  .  spironolactone  100 mg Oral Daily  . ursodiol  300 mg Oral TID  . zolpidem  5 mg Oral QHS   Continuous Infusions:    Assessment and plan: Principal Problem:   Dyspnea Active Problems:   Decompensation of cirrhosis of liver   Anasarca   Pulmonary edema, noncardiac   Hypothyroidism   Cirrhosis of liver with ascites   Thrombocytopenia   Hyponatremia   Anemia of chronic disease   Obesity, morbid  1. Noncardiogenic pulmonary edema, secondary to decompensated cirrhosis causing dyspnea. On admission, the patient was noted to have more generalized body edema. Her chest x-ray revealed vascular congestion and mild bibasal opacities, likely reflecting mild interstitial edema. Her BNP was only 291. She was started on IV Lasix and continued on spironolactone. Her ins/outs are -2.6 L so far. She does have less referral edema. 2-D echocardiogram was ordered for further evaluation. They revealed mild LVH, EF of 65-70%, and mild pulmonary hypertension. No mention of diastolic dysfunction. Chest x-ray ordered 6/24 for further evaluation and revealed no evidence of edema. We'll continue current treatment.  Decompensated NASH cirrhosis causing anasarca. The patient was just discharged 2 weeks ago for the same. As above, she was started on IV Lasix. Gastronenterologist, Dr. Karilyn Cota was consulted and ordered albumin 50 g daily 3 days. Apparently, a combination of albumin infusion and Lasix helps with better diuresis in this particular patient. Apparently, diuretic therapy was held following the recent cardiac catheterization at [facility]. She has been continued on spironolactone and lactulose. Actigall was added per GI. She does not appear to have significant ascites, so we'll hold off on paracentesis for now. Her diet was changed to 2 g sodium diet by Dr. Karilyn Cota. Patient's ammonia level is within normal limits. The patient has been evaluated by transplant team at [facility] and apparently, she  is listed. She recently underwent a right heart cardiac catheterization and it revealed no apparent CAD and elevated left ventricular pressure. -We'll watch her renal function/BUN on continued IV Lasix.  Chronic hyponatremia. The patient's serum sodium has ranged from 124-125, secondary to chronic cirrhosis and total body edema. Her serum sodium has improved some today. Her TSH was within normal limits. Her cortisol level was on the low end of normal.  Cholestasis and hyperbilirubinemia, secondary to cirrhosis. The patient's bilirubin was 8.6 on admission, but has been as high as greater than 10 in the recent past. -Actigall added per GI.  Chronic anemia and thrombocytopenia, secondary to cirrhosis and likely splenomegaly; chronic disease. The patient's platelet count has been historically low. It was 114 on admission and has drifted down some. Anemia panel revealed a normal total iron, ferritin of 423, folate of 19.5, and vitamin B12 of 814. No active signs of bleeding. This  will continue to be monitored closely.  Chronic musculoskeletal cramping. We'll allow no more than 3 packets of mustard protocol request although the patient is on a low-sodium diet.  Nutrition: Registered dietitian consulted and counseled patient on a low-sodium diet.    Time spent: 25  minutes    [facility]  [facility] Pager [phone]. If 7PM-7AM, please contact night-coverage at [email or web address], password [facility] 06/06/2015, 1:00 PM  LOS: 4 days

## 2015-06-07 DIAGNOSIS — R601 Generalized edema: Secondary | ICD-10-CM

## 2015-06-07 DIAGNOSIS — K7290 Hepatic failure, unspecified without coma: Secondary | ICD-10-CM

## 2015-06-07 LAB — CBC
HCT: 25.7 % — ABNORMAL LOW (ref 36.0–46.0)
Hemoglobin: 8.9 g/dL — ABNORMAL LOW (ref 12.0–15.0)
MCH: 34.1 pg — ABNORMAL HIGH (ref 26.0–34.0)
MCHC: 34.6 g/dL (ref 30.0–36.0)
MCV: 98.5 fL (ref 78.0–100.0)
Platelets: 98 10*3/uL — ABNORMAL LOW (ref 150–400)
RBC: 2.61 MIL/uL — ABNORMAL LOW (ref 3.87–5.11)
RDW: 18.8 % — AB (ref 11.5–15.5)
WBC: 5.8 10*3/uL (ref 4.0–10.5)

## 2015-06-07 LAB — BASIC METABOLIC PANEL
ANION GAP: 9 (ref 5–15)
BUN: 30 mg/dL — AB (ref 6–20)
CALCIUM: 9 mg/dL (ref 8.9–10.3)
CHLORIDE: 95 mmol/L — AB (ref 101–111)
CO2: 23 mmol/L (ref 22–32)
CREATININE: 0.93 mg/dL (ref 0.44–1.00)
GFR calc Af Amer: >60 mL/min (ref >60)
Glucose, Bld: 93 mg/dL (ref 65–99)
POTASSIUM: 4.6 mmol/L (ref 3.5–5.1)
Sodium: 127 mmol/L — ABNORMAL LOW (ref 135–145)

## 2015-06-07 MED ORDER — TORSEMIDE 20 MG PO TABS
20.0000 mg | ORAL_TABLET | Freq: Two times a day (BID) | ORAL | Status: DC
  Administered 2015-06-07: 20 mg via ORAL
  Filled 2015-06-07: qty 1

## 2015-06-07 MED ORDER — DIPHENHYDRAMINE HCL 25 MG PO CAPS: 25.0000 mg | ORAL_CAPSULE | Freq: Four times a day (QID) | ORAL | Status: DC | PRN

## 2015-06-07 MED ORDER — DIPHENHYDRAMINE HCL 12.5 MG/5ML PO ELIX
12.5000 mg | ORAL_SOLUTION | Freq: Four times a day (QID) | ORAL | Status: DC | PRN
  Administered 2015-06-07 – 2015-06-08 (×3): 12.5 mg via ORAL
  Filled 2015-06-07 (×4): qty 5

## 2015-06-07 NOTE — Progress Notes (Addendum)
[facility] PROGRESS NOTE  Cynthia Stephenson DXA:[id number] DOB: 12-Nov-1966 DOA: 06/02/2015 PCP: [name], MD    Code Status: Full code Family Communication: Discussed with patient; family not available Disposition Plan: Discharge when clinically appropriate   Consultants:  Gastroenterology, Dr. Karilyn Cota  Procedures:  None  Antibiotics:  None  HPI/Subjective: Patient complains of itching on her legs which she says may have been attributed to a shower gel. She complains of multiple loose bowel movements overnight.  Objective: Filed Vitals:   06/07/15 1459  BP: 111/45  Pulse: 84  Temp: 98.5 F (36.9 C)  Resp: 18   oxygen saturation 98%.  Intake/Output Summary (Last 24 hours) at 06/07/15 1756 Last data filed at 06/07/15 1300  Gross per 24 hour  Intake    963 ml  Output   2200 ml  Net  -1237 ml   Filed Weights   06/05/15 0634 06/06/15 0500 06/07/15 0634  Weight: 110.179 kg (242 lb 14.4 oz) 110.496 kg (243 lb 9.6 oz) 108.682 kg (239 lb 9.6 oz)    Exam:   General:  Obese 49 year old woman in no acute distress.  Eyes: Positive for scleral icterus.  Cardiovascular: S1, S2, with soft systolic murmur.  Respiratory: Breathing mildly labored at rest (question if this is chronic). No audible crackles or wheezes.  Abdomen: Obese, positive bowel sounds, mild fluid wave, nontender, nondistended.  Musculoskeletal/extremities: Decreasing bilateral lower extremity edema.   Neurologic: She is alert and oriented 3. Her speech is clear.   Skin: Mild generalized jaundice. Chronic ecchymosis on both arms; no urticaria.  Neurologic: She is alert and oriented 3. Cranial nerves II through XII are intact.  Data Reviewed: Basic Metabolic Panel:  Recent Labs Lab 06/03/15 0606 06/04/15 1030 06/05/15 0614 06/06/15 0444 06/07/15 0616  NA 126* 124* 125* 128* 127*  K 4.7 4.6 4.9 5.0 4.6  CL 95* 92* 93* 95* 95*  CO2 22 23 24 24 23   GLUCOSE 85 108* 98 86 93  BUN 24*  26* 26* 28* 30*  CREATININE 0.82 0.80 0.74 0.84 0.93  CALCIUM 8.5* 8.5* 8.6* 8.9 9.0   Liver Function Tests:  Recent Labs Lab 06/02/15 2115 06/03/15 0606 06/04/15 1030  AST 81* 74* 71*  ALT 46 43 40  ALKPHOS 117 109 100  BILITOT 9.8* 9.0* 9.3*  PROT 6.4* 6.0* 6.4*  ALBUMIN 3.2* 3.0* 3.5   No results for input(s): LIPASE, AMYLASE in the last 168 hours.  Recent Labs Lab 06/05/15 0609  AMMONIA 31   CBC:  Recent Labs Lab 06/02/15 2115 06/03/15 0606 06/04/15 1030 06/05/15 0609 06/06/15 0444 06/07/15 0616  WBC 9.4 7.6 6.8 6.1 6.1 5.8  NEUTROABS 6.2  --   --   --   --   --   HGB 10.1* 9.5* 9.1* 9.2* 8.7* 8.9*  HCT 28.9* 27.0* 25.3* 25.8* 24.3* 25.7*  MCV 97.3 96.8 96.6 97.4 96.0 98.5  PLT 114* 99* 89* 98* 92* 98*   Cardiac Enzymes: No results for input(s): CKTOTAL, CKMB, CKMBINDEX, TROPONINI in the last 168 hours. BNP (last 3 results)  Recent Labs  03/19/15 1340 05/23/15 0656 06/02/15 2115  BNP 158.0* 101.0* 291.0*    ProBNP (last 3 results) No results for input(s): PROBNP in the last 8760 hours.  CBG: No results for input(s): GLUCAP in the last 168 hours.  No results found for this or any previous visit (from the past 240 hour(s)).   Studies: No results found.  Scheduled Meds: . lactulose  20 g Oral TID  .  levETIRAcetam  500 mg Oral BID  . levothyroxine  75 mcg Oral QAC breakfast  . multivitamin with minerals  1 tablet Oral Daily  . nystatin  5 mL Oral Daily  . pantoprazole  40 mg Oral QAC breakfast  . sodium chloride  3 mL Intravenous Q12H  . sodium chloride  3 mL Intravenous Q12H  . spironolactone  100 mg Oral Daily  . ursodiol  300 mg Oral TID  . zolpidem  5 mg Oral QHS   Continuous Infusions:    Assessment and plan: Principal Problem:   Dyspnea Active Problems:   Decompensation of cirrhosis of liver   Anasarca   Pulmonary edema, noncardiac   Hypothyroidism   Cirrhosis of liver with ascites   Thrombocytopenia   Hyponatremia    Anemia of chronic disease   Obesity, morbid  1. Noncardiogenic pulmonary edema, secondary to decompensated cirrhosis causing dyspnea. On admission, the patient was noted to have more generalized body edema. Her chest x-ray revealed vascular congestion and mild bibasal opacities, likely reflecting mild interstitial edema. Her BNP was only 291. She was started on IV Lasix and continued on spironolactone. Her ins/outs are -5.4 L so far. She does have less body edema. 2-D echocardiogram was ordered for further evaluation. They revealed mild LVH, EF of 65-70%, and mild pulmonary hypertension. No mention of diastolic dysfunction. Chest x-ray ordered 6/24 for further evaluation and revealed no evidence of edema. -Given the increase in her BUN, Lasix was discontinued and she was restarted her home dose of torsemide.   Decompensated NASH cirrhosis causing anasarca. The patient was just discharged 2 weeks ago for the same. As above, she was started on IV Lasix. Gastronenterologist, Dr. Karilyn Cota was consulted and ordered albumin 50 g daily 3 days. It was completed. Apparently, a combination of albumin infusion and Lasix helps with better diuresis in this particular patient. Apparently, diuretic therapy was held following the recent cardiac catheterization at [facility]. She has been continued on spironolactone and lactulose. Actigall was added per GI. She does not appear to have significant ascites, so paracentesis was not ordered.. Her diet was changed to 2 g sodium diet by Dr. Karilyn Cota. Patient's ammonia level is within normal limits. The patient has been evaluated by transplant team at [facility] and apparently, she is listed. She recently underwent a right heart cardiac catheterization and it revealed no apparent CAD and elevated left ventricular pressure. -Because of her increasing BUN, IV Lasix was discontinued today. She was restarted on torsemide. -Agree that increase stooling may be attributing  to some of her intravascular volume depletion. -P.m. dose of Demadex held per GI. Will defer lactulose dosing to GI.  Chronic hyponatremia. The patient's serum sodium had been ranging from 124-125, secondary to chronic cirrhosis and total body edema. Her serum sodium has improved over the last couple days ranging from 127-128. Her TSH was within normal limits. Her cortisol level was on the low end of normal.  Cholestasis and hyperbilirubinemia, secondary to cirrhosis. The patient's bilirubin was 8.6 on admission, but has been as high as greater than 10 in the recent past. -Actigall added per GI.  Chronic anemia and thrombocytopenia, secondary to cirrhosis and likely splenomegaly; chronic disease. The patient's platelet count has been historically low. It was 114 on admission and has drifted down some. Her hemoglobin has also drifted down slowly to 8.9. Anemia panel revealed a normal total iron, ferritin of 423, folate of 19.5, and vitamin B12 of 814. No active signs  of bleeding. This will continue to be monitored closely.  Pruritus. The patient attributed the itching today to a new shower gel. As needed Benadryl was ordered. We'll give also give prednisone when necessary is needed.  Chronic musculoskeletal cramping. We'll allow no more than 3 packets of mustard protocol request although the patient is on a low-sodium diet.  Nutrition: Registered dietitian consulted and counseled patient on a low-sodium diet.    Time spent: 25  minutes    [facility]  [facility] Pager [phone]. If 7PM-7AM, please contact night-coverage at [email or web address], password [facility] 06/07/2015, 5:56 PM  LOS: 5 days

## 2015-06-07 NOTE — Progress Notes (Addendum)
Patient complains of mild headache and rash over lower extremities this morning (daughter-in-law brought in over-the-counter lotion for her which she applied in this area yesterday). Also, Complains of a couple loose nonbloody stools overnight.  Weight down by another 1.8 kg per nursing staff. Intake and output not completely documented.  Vital signs in last 24 hours: Temp:  [97.7 F (36.5 C)-98.7 F (37.1 C)] 98.3 F (36.8 C) (06/26 0634) Pulse Rate:  [80-88] 84 (06/26 0634) Resp:  [20] 20 (06/26 0634) BP: (106-117)/(42-59) 114/46 mmHg (06/26 0634) SpO2:  [97 %-100 %] 99 % (06/26 0634) Weight:  [239 lb 9.6 oz (108.682 kg)] 239 lb 9.6 oz (108.682 kg) (06/26 0634) Last BM Date: 06/06/15 General:   Alert,  conversant and anxious appearing Abdomen:  Obese. Soft and nontender.  Extremities:  1+ lower extremity edema. I see no rash.   Intake/Output from previous day: 06/25 0701 - 06/26 0700 In: 1206 [P.O.:1200; I.V.:6] Out: 5350 [Urine:5350] Intake/Output this shift: Total I/O In: 240 [P.O.:240] Out: -   Lab Results:  Recent Labs  06/05/15 0609 06/06/15 0444 06/07/15 0616  WBC 6.1 6.1 5.8  HGB 9.2* 8.7* 8.9*  HCT 25.8* 24.3* 25.7*  PLT 98* 92* 98*   BMET  Recent Labs  06/05/15 0614 06/06/15 0444 06/07/15 0616  NA 125* 128* 127*  K 4.9 5.0 4.6  CL 93* 95* 95*  CO2 24 24 23   GLUCOSE 98 86 93  BUN 26* 28* 30*  CREATININE 0.74 0.84 0.93  CALCIUM 8.6* 8.9 9.0   LFT No results for input(s): PROT, ALBUMIN, AST, ALT, ALKPHOS, BILITOT, BILIDIR, IBILI in the last 72 hours. PT/INR No results for input(s): LABPROT, INR in the last 72 hours. Hepatitis Panel No results for input(s): HEPBSAG, HCVAB, HEPAIGM, HEPBIGM in the last 72 hours. C-Diff No results for input(s): CDIFFTOX in the last 72 hours.  Studies/Results: Dg Chest 2 View  06/05/2015   CLINICAL DATA:  Pulmonary edema  EXAM: CHEST  2 VIEW  COMPARISON:  06/02/2015  FINDINGS: Cardiomediastinal silhouette is  stable. No segmental infiltrate. Mild residual interstitial prominence bilaterally without pulmonary edema.  IMPRESSION: No segmental infiltrate. Minimal reticular interstitial prominence bilaterally without pulmonary edema.   Electronically Signed   By: Natasha Mead M.D.   On: 06/05/2015 14:07    Assessment: Principal Problem:   Dyspnea Active Problems:   Hypothyroidism   Cirrhosis of liver with ascites   Thrombocytopenia   Decompensation of cirrhosis of liver   Hyponatremia   Anemia of chronic disease   Anasarca   Pulmonary edema, noncardiac   Obesity, morbid  LOS: 5 days    Impression:   Nash/cirrhosis-decompensated with third spacing. She has diuresed modestly based on weights. BUN creeping upward. Loose stools may be contributing intravascular volume depletion. Anemia-stable.     Recommendations:  Will hold this evening's dose of Demadex.  Recheck metabolic profile tomorrow morning. She may benefit from periodic outpatient IV albumin therapy. Benadryl for itching per attending. Reassess loose stools in the a.m. Further recommendations to follow up tomorrow by Dr. Karilyn Cota.      Eula Listen  06/07/2015, 12:00 PM

## 2015-06-07 NOTE — Progress Notes (Signed)
Weight 06/06/15 110.496 kg, this AM 108.682 kg.  Loss of 1.814 kg.

## 2015-06-08 LAB — BASIC METABOLIC PANEL
ANION GAP: 9 (ref 5–15)
BUN: 27 mg/dL — ABNORMAL HIGH (ref 6–20)
CHLORIDE: 96 mmol/L — AB (ref 101–111)
CO2: 24 mmol/L (ref 22–32)
Calcium: 9.1 mg/dL (ref 8.9–10.3)
Creatinine, Ser: 0.79 mg/dL (ref 0.44–1.00)
GFR calc Af Amer: >60 mL/min (ref >60)
GFR calc non Af Amer: >60 mL/min (ref >60)
Glucose, Bld: 87 mg/dL (ref 65–99)
Potassium: 5.1 mmol/L (ref 3.5–5.1)
Sodium: 129 mmol/L — ABNORMAL LOW (ref 135–145)

## 2015-06-08 MED ORDER — LACTULOSE 10 GM/15ML PO SOLN
10.0000 g | Freq: Three times a day (TID) | ORAL | Status: DC
  Administered 2015-06-08: 10 g via ORAL
  Filled 2015-06-08 (×2): qty 30

## 2015-06-08 MED ORDER — METOLAZONE 5 MG PO TABS
5.0000 mg | ORAL_TABLET | Freq: Once | ORAL | Status: AC
  Administered 2015-06-08: 5 mg via ORAL
  Filled 2015-06-08: qty 1

## 2015-06-08 MED ORDER — TORSEMIDE 20 MG PO TABS
20.0000 mg | ORAL_TABLET | Freq: Every day | ORAL | Status: DC
  Administered 2015-06-08 – 2015-06-09 (×2): 20 mg via ORAL
  Filled 2015-06-08 (×2): qty 1

## 2015-06-08 MED ORDER — RIFAXIMIN 550 MG PO TABS
550.0000 mg | ORAL_TABLET | Freq: Two times a day (BID) | ORAL | Status: DC
  Administered 2015-06-08 – 2015-06-09 (×2): 550 mg via ORAL
  Filled 2015-06-08 (×2): qty 1

## 2015-06-08 NOTE — Progress Notes (Signed)
  Subjective:  Patient has multiple complaints. She complains of not feeling well and feels very weak. She also complains of shortness of breath when she walks to the bathroom. According to her husband she has a good appetite. She denies melena or rectal bleeding.   Objective: Blood pressure 112/41, pulse 89, temperature 97.6 F (36.4 C), temperature source Oral, resp. rate 20, height 5\' 2"  (1.575 m), weight 241 lb 4.8 oz (109.453 kg), SpO2 100 %. Patient is alert. She does not have asterixis but has tremors involving both hands. She has fruity odor to her breath. Conjunctiva is pink. Sclera is icteric Oropharyngeal mucosa is normal. No neck masses or thyromegaly noted. Cardiac exam with regular rhythm normal S1 and S2. No murmur or gallop noted. Lungs are clear to auscultation. Abdomen is full but soft and nontender without organomegaly or masses. Trace edema noted around ankles. No sacral edema present.  Urine output 2000 ml last 24 hours  Labs/studies Results:   Recent Labs  06/06/15 0444 06/07/15 0616  WBC 6.1 5.8  HGB 8.7* 8.9*  HCT 24.3* 25.7*  PLT 92* 98*    BMET   Recent Labs  06/06/15 0444 06/07/15 0616 06/08/15 0553  NA 128* 127* 129*  K 5.0 4.6 5.1  CL 95* 95* 96*  CO2 24 23 24   GLUCOSE 86 93 87  BUN 28* 30* 27*  CREATININE 0.84 0.93 0.79  CALCIUM 8.9 9.0 9.1     Assessment:  #1. Fluid overload. Diarrhetic therapy was held because of mild bump in BUN. She has gained 2 pounds in the last 24 hours presuming weights are a great. She does not have significant pitting edema to her lower extremities like she had before and she is not losing weight like she did before. Some of her weight gain may be real because she is eating and not able to exercise. Hyponatremia is gradually improving. #2. 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 Per her transplant hepatologist [facility] could be listed  for liver transplant at any time. He told me that all the data had been submitted. #3. Anemia due to chronic disease. #4. Thrombocytopenia secondary to cirrhosis and splenomegaly. #5. Progressive weakness. Felt to be multifactorial. Luisa HartPatrick and cephalopathy may also be contributing. #6. Exertional dyspnea. She has [facility] and she underwent echo at this facility last week revealing elevated right-sided pressures felt to be due to high output state associated with end-stage liver disease.    Recommendations:  Resume torsemide 20 mg by mouth every morning. Metolazone 5 mg by mouth 1 hour after torsemide given. Decrease lactulose to 10 g by mouth twice a day. Xifaxan 550 mg by mouth twice a day. Metabolic 7 serum albumin and ammonia level in am.

## 2015-06-08 NOTE — Progress Notes (Signed)
[facility] PROGRESS NOTE  Cynthia Stephenson ZOX:[id number] DOB: 1966/03/01 DOA: 06/02/2015 PCP: [name], MD    Code Status: Full code Family Communication: Discussed with patient's husband Disposition Plan: Discharge when clinically appropriate, likely in the next day or 2.   Consultants:  Gastroenterology, Dr. Karilyn Cota  Procedures:  None  Antibiotics:  None  HPI/Subjective: Patient has no new complaints. She continues to be short of breath with minimal activity, but she denies chest congestion.  Objective: Filed Vitals:   06/08/15 1407  BP: 125/48  Pulse: 87  Temp: 97.5 F (36.4 C)  Resp: 20   oxygen saturation 100%.  Intake/Output Summary (Last 24 hours) at 06/08/15 1626 Last data filed at 06/08/15 1408  Gross per 24 hour  Intake    900 ml  Output   3500 ml  Net  -2600 ml   Filed Weights   06/06/15 0500 06/07/15 0634 06/08/15 0510  Weight: 110.496 kg (243 lb 9.6 oz) 108.682 kg (239 lb 9.6 oz) 109.453 kg (241 lb 4.8 oz)    Exam:   General:  Obese 49 year old woman in no acute distress.  Eyes: Positive for scleral icterus.  Cardiovascular: S1, S2, with soft systolic murmur.  Respiratory: Breathing mildly labored at rest (question if this is chronic). No audible crackles or wheezes.  Abdomen: Obese, positive bowel sounds, mild fluid wave, nontender, nondistended.  Musculoskeletal/extremities: Trace to no bilateral lower extremity edema.   Neurologic: She is alert and oriented 3. Her speech is clear.   Skin: Mild generalized jaundice. Chronic ecchymosis on both arms; no urticaria.  Neurologic: She is alert and oriented 3. Cranial nerves II through XII are intact.  Data Reviewed: Basic Metabolic Panel:  Recent Labs Lab 06/04/15 1030 06/05/15 0614 06/06/15 0444 06/07/15 0616 06/08/15 0553  NA 124* 125* 128* 127* 129*  K 4.6 4.9 5.0 4.6 5.1  CL 92* 93* 95* 95* 96*  CO2 GLUCOSE 108* 98 86 93 87  BUN 26* 26* 28* 30* 27*   CREATININE 0.80 0.74 0.84 0.93 0.79  CALCIUM 8.5* 8.6* 8.9 9.0 9.1   Liver Function Tests:  Recent Labs Lab 06/02/15 2115 06/03/15 0606 06/04/15 1030  AST 81* 74* 71*  ALT 46 43 40  ALKPHOS 117 109 100  BILITOT 9.8* 9.0* 9.3*  PROT 6.4* 6.0* 6.4*  ALBUMIN 3.2* 3.0* 3.5   No results for input(s): LIPASE, AMYLASE in the last 168 hours.  Recent Labs Lab 06/05/15 0609  AMMONIA 31   CBC:  Recent Labs Lab 06/02/15 2115 06/03/15 0606 06/04/15 1030 06/05/15 0609 06/06/15 0444 06/07/15 0616  WBC 9.4 7.6 6.8 6.1 6.1 5.8  NEUTROABS 6.2  --   --   --   --   --   HGB 10.1* 9.5* 9.1* 9.2* 8.7* 8.9*  HCT 28.9* 27.0* 25.3* 25.8* 24.3* 25.7*  MCV 97.3 96.8 96.6 97.4 96.0 98.5  PLT 114* 99* 89* 98* 92* 98*   Cardiac Enzymes: No results for input(s): CKTOTAL, CKMB, CKMBINDEX, TROPONINI in the last 168 hours. BNP (last 3 results)  Recent Labs  03/19/15 1340 05/23/15 0656 06/02/15 2115  BNP 158.0* 101.0* 291.0*    ProBNP (last 3 results) No results for input(s): PROBNP in the last 8760 hours.  CBG: No results for input(s): GLUCAP in the last 168 hours.  No results found for this or any previous visit (from the past 240 hour(s)).   Studies: No results found.  Scheduled Meds: . lactulose  10 g Oral  TID  . levETIRAcetam  500 mg Oral BID  . levothyroxine  75 mcg Oral QAC breakfast  . multivitamin with minerals  1 tablet Oral Daily  . nystatin  5 mL Oral Daily  . pantoprazole  40 mg Oral QAC breakfast  . rifaximin  550 mg Oral BID PC  . sodium chloride  3 mL Intravenous Q12H  . sodium chloride  3 mL Intravenous Q12H  . spironolactone  100 mg Oral Daily  . torsemide  20 mg Oral Daily  . ursodiol  300 mg Oral TID  . zolpidem  5 mg Oral QHS   Continuous Infusions:    Assessment and plan: Principal Problem:   Dyspnea Active Problems:   Decompensation of cirrhosis of liver   Anasarca   Pulmonary edema, noncardiac   Hypothyroidism   Cirrhosis of liver with  ascites   Thrombocytopenia   Hyponatremia   Anemia of chronic disease   Obesity, morbid  1. Noncardiogenic pulmonary edema, secondary to decompensated cirrhosis causing dyspnea. On admission, the patient was noted to have more generalized body edema. Her chest x-ray revealed vascular congestion and mild bibasal opacities, likely reflecting mild interstitial edema. Her BNP was only 291. She was started on IV Lasix and continued on spironolactone. 2-D echocardiogram was ordered for further evaluation. They revealed mild LVH, EF of 65-70%, and mild pulmonary hypertension. No mention of diastolic dysfunction. Chest x-ray ordered 6/24 for further evaluation and revealed no evidence of edema. -Her ins/outs are -8.0 L so far. -Given the increase in her BUN, Lasix was discontinued and she was restarted her home dose of torsemide. -Dr. Karilyn Cota ordered Zaroxolyn 1. Two pound weight gain was recorded since yesterday. The patient is -8 L and I doubt that the 2 pound weight gain was accurate. -I believe her dyspnea is secondary to global deconditioning.   Decompensated NASH cirrhosis causing anasarca. The patient was just discharged 2 weeks ago for the same. As above, she was started on IV Lasix. Gastronenterologist, Dr. Karilyn Cota was consulted and ordered albumin 50 g daily 3 days. It was completed. Apparently, a combination of albumin infusion and Lasix helps with better diuresis in this particular patient. Apparently, diuretic therapy was held following the recent cardiac catheterization at [facility]. She has been continued on spironolactone and lactulose. Actigall was added per GI. She does not appear to have significant ascites, so paracentesis was not ordered.. Her diet was changed to 2 g sodium diet by Dr. Karilyn Cota. Patient's ammonia level is within normal limits. The patient has been evaluated by transplant team at [facility] and apparently, she is pending being listed. She recently underwent  a right heart cardiac catheterization and it revealed no apparent CAD and elevated left ventricular pressure. -Because of her increasing BUN, IV Lasix was discontinued on 6/26 and she was restarted on torsemide. -Dr. Karilyn Cota added rifaximin. -Her serum potassium is trending up, would hold Aldactone if it continues to increase.  Chronic hyponatremia. The patient's serum sodium had been ranging from 124-125, secondary to chronic cirrhosis and total body edema. Her serum sodium has improved over the last couple days ranging from 127-129. Her TSH was within normal limits. Her cortisol level was on the low end of normal.  Cholestasis and hyperbilirubinemia, secondary to cirrhosis. The patient's bilirubin was 8.6 on admission, but has been as high as greater than 10 in the recent past. -Actigall added per GI.  Chronic anemia and thrombocytopenia, secondary to cirrhosis and likely splenomegaly; chronic disease. The  patient's platelet count has been historically low. It was 114 on admission and has drifted down some. Her hemoglobin has also drifted down slowly to 8.9. Anemia panel revealed a normal total iron, ferritin of 423, folate of 19.5, and vitamin B12 of 814. No active signs of bleeding. This will continue to be monitored closely.  Chronic musculoskeletal cramping. We'll allow no more than 3 packets of mustard protocol request although the patient is on a low-sodium diet.  Nutrition: Registered dietitian consulted and counseled patient on a low-sodium diet.    Time spent: 25  minutes    [facility]  [facility] Pager [phone]. If 7PM-7AM, please contact night-coverage at [email or web address], password [facility] 06/08/2015, 4:26 PM  LOS: 6 days

## 2015-06-08 NOTE — Progress Notes (Signed)
Took patients rifaximin and lactulose dose to her.  She stated that she had not eaten yet and wanted to wait for the meds.  I told her to call when she was ready for them.

## 2015-06-09 ENCOUNTER — Other Ambulatory Visit (INDEPENDENT_AMBULATORY_CARE_PROVIDER_SITE_OTHER): Payer: Self-pay | Admitting: Internal Medicine

## 2015-06-09 ENCOUNTER — Other Ambulatory Visit: Payer: Self-pay | Admitting: Internal Medicine

## 2015-06-09 LAB — BILIRUBIN, TOTAL: BILIRUBIN TOTAL: 11.6 mg/dL — AB (ref 0.3–1.2)

## 2015-06-09 LAB — BASIC METABOLIC PANEL
ANION GAP: 8 (ref 5–15)
BUN: 26 mg/dL — AB (ref 6–20)
CALCIUM: 9.1 mg/dL (ref 8.9–10.3)
CHLORIDE: 94 mmol/L — AB (ref 101–111)
CO2: 25 mmol/L (ref 22–32)
CREATININE: 0.7 mg/dL (ref 0.44–1.00)
Glucose, Bld: 87 mg/dL (ref 65–99)
Potassium: 4.6 mmol/L (ref 3.5–5.1)
Sodium: 127 mmol/L — ABNORMAL LOW (ref 135–145)

## 2015-06-09 LAB — ALBUMIN: ALBUMIN: 3.8 g/dL (ref 3.5–5.0)

## 2015-06-09 LAB — PROTIME-INR
INR: 1.68 — ABNORMAL HIGH (ref 0.00–1.49)
Prothrombin Time: 19.8 seconds — ABNORMAL HIGH (ref 11.6–15.2)

## 2015-06-09 LAB — AMMONIA: Ammonia: 33 umol/L (ref 9–35)

## 2015-06-09 MED ORDER — TORSEMIDE 20 MG PO TABS: 20.0000 mg | ORAL_TABLET | Freq: Every day | ORAL | Status: DC

## 2015-06-09 MED ORDER — LACTULOSE 10 GM/15ML PO SOLN: 10.0000 g | Freq: Three times a day (TID) | ORAL | Status: DC

## 2015-06-09 MED ORDER — PREDNISONE 20 MG PO TABS: 30.0000 mg | ORAL_TABLET | Freq: Every day | ORAL | Status: DC

## 2015-06-09 MED ORDER — METOLAZONE 2.5 MG PO TABS: 2.5000 mg | ORAL_TABLET | ORAL | Status: DC | PRN

## 2015-06-09 MED ORDER — RIFAXIMIN 550 MG PO TABS: 550.0000 mg | ORAL_TABLET | Freq: Two times a day (BID) | ORAL | Status: DC

## 2015-06-09 NOTE — Progress Notes (Signed)
  Subjective:  Patient denies shortness of breath. She continues to complain of feeling weak and nervous. She ate most of her breakfast. He feels thirsty and is using ice all the time.   Objective: Blood pressure 116/40, pulse 86, temperature 98.1 F (36.7 C), temperature source Oral, resp. rate 18, height 5\' 2"  (1.575 m), weight 237 lb 7 oz (107.7 kg), SpO2 97 %. Patient is alert and does not have asterixis. She has tremors involving both hands. Conjunctiva is pink. Sclera is icteric Lungs are clear to auscultation. Abdomen is full but soft and nontender without organomegaly or masses. She has trace edema around ankles slightly more on the left side. She has multiple ecchymosis involving both forearms.  Urine output 3800 ml os 24 hours  Labs/studies Results:   Recent Labs  06/07/15 0616  WBC 5.8  HGB 8.9*  HCT 25.7*  PLT 98*    BMET   Recent Labs  06/07/15 0616 06/08/15 0553 06/09/15 0647  NA 127* 129* 127*  K 4.6 5.1 4.6  CL 95* 96* 94*  CO2 23 24 25   GLUCOSE 93 87 87  BUN 30* 27* 26*  CREATININE 0.93 0.79 0.70  CALCIUM 9.0 9.1 9.1    LFT   Recent Labs  06/09/15 0647  ALBUMIN 3.8     Assessment:  #1. Fluid overload. He had good response to combination of torsemide and metolazone. If weights are correct she has lost 4 pounds in the last 2 days. By mouth diarrhetic therapy should work as long as albumin is close to normal. #2. 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 #3. Anemia of chronic disease. #4. Thrombocytopenia agree to chronic liver disease #5. Hyponatremia is stable secondary to diarrhetic therapy.  Recommendations:  Continue torsemide 20 mg by mouth every morning. Continue spironolactone and milligrams by mouth daily She should use metolazone 2.5 mg daily when necessary if she has gained 3 pounds or more. Patient advised to check weight every morning. She should limit by mouth fluid intake to  50 or 60 ounces per day. Patient will monitor by mouth intake of fluids. Continue lactulose Xifaxan but discontinue zolpidem. Office visit within 2 weeks.

## 2015-06-09 NOTE — Discharge Summary (Signed)
Physician Discharge Summary  Cynthia Stephenson ZOX:[id number] DOB: 1966-11-17 DOA: 06/02/2015  PCP: Cynthia Pizza, MD  Admit date: 06/02/2015 Discharge date: 06/09/2015  Time spent: greater than minutes  Recommendations for Outpatient Follow-up:  1. Recommend follow up of the patient's metabolic panel.  Discharge Diagnoses:    1.Pulmonary edema, noncardiac from decompensation of cirrhosis of liver causing dyspnea.   2..Anasarca -- I/O was -10.4 liters. --weight at discharge 237.5 pounds   3. Cirrhosis of liver with ascites   4. Hypothyroidism   5.Thrombocytopenia and coagulopathy secondary to cirrhosis   6.Hyponatremia   7..Anemia of chronic disease   8.Obesity, morbid   9. Seizure disorder   10. Global deconditioning   Discharge Condition: Improved  Diet recommendation: Low sodium  Filed Weights   06/07/15 0634 06/08/15 0510 06/09/15 0450  Weight: 108.682 kg (239 lb 9.6 oz) 109.453 kg (241 lb 4.8 oz) 107.7 kg (237 lb 7 oz)    History of present illness:  49 year old woman with NAFLD with cirrhosis and who is being followed by GI, Dr. Karilyn Stephenson and [name] undergoing preparation to become a liver transplant candidate, hypothyroidism, and seizure disorder who presented with increasing shortness of breath, fatigue and dramatic weight gain over the last 2 weeks. In the ED, her 02 saturations were in the upper 90s on room air. Her chest x-ray revealed vascular congestion and bibasilar opacities likely reflecting mild interstitial edema. She was admitted for further evaluation and management.   Hospital Course:  1. Noncardiogenic pulmonary edema, secondary to decompensated cirrhosis causing dyspnea. On admission, the patient was noted to have generalized body edema, but no significant ascites.. Her chest x-ray revealed vascular congestion and mild bibasal opacities, likely reflecting mild interstitial edema. Her BNP was only 291. She was started on IV Lasix and continued on  spironolactone. 2-D echocardiogram was ordered for further evaluation. It revealed mild LVH, EF of 65-70%, and mild pulmonary hypertension. No mention of diastolic dysfunction. Dr. Karilyn Stephenson ordered Zaroxolyn 1 for a supposed 2 pound weight gain.  Follow up chest x-ray ordered 06/05/15 and revealed no evidence of edema. At the time of hospital discharge, her weight decreased 12 pounds to 237.5 pounds and she was negative 10 liters. She was discharged on her usual dose of torsemide and spironolactone. Dr.Rehman added prn Zaroxolyn for a weight gain of 3 or more pounds.   Decompensated NASH cirrhosis causing anasarca. The patient was just discharged 2 weeks ago for the same. As above, she was started on IV Lasix. Gastronenterologist, Dr. Karilyn Stephenson was consulted and ordered albumin 50 g daily 3 days. It was completed. Apparently, a combination of albumin infusion and Lasix helps with better diuresis in this particular patient. Apparently, diuretic therapy was held following the recent cardiac catheterization at [name]. She was also restarted on spironolactone and lactulose. Actigall was restarted per GI. She did not appear to have significant ascites, so paracentesis was not ordered. Her diet was changed to 2 g sodium diet by Dr. Karilyn Stephenson. Patient's ammonia level was within normal limits. The patient has been evaluated by the transplant team at [name] and had become listed. She recently underwent a right heart cardiac catheterization at [name] and it revealed no apparent CAD and elevated left ventricular pressure. She was continued on her chronic cirrhosis medications. Treatment for anasarca as above in #1.   Chronic hyponatremia. The patient's serum sodium ranged from 124 to127 at discharge, secondary to chronic cirrhosis and total body edema. Her TSH was within normal limits.  Her cortisol level was on the low end of normal.  Cholestasis and hyperbilirubinemia, secondary to cirrhosis. The  patient's bilirubin was 8.6 on admission, but has been as high as greater than 10 in the recent past. -Actigall was restarted per GI.  Chronic anemia and thrombocytopenia, secondary to cirrhosis and likely splenomegaly; chronic disease. The patient's platelet count has been historically low. It was 114 on admission and has drifted down some. Her hemoglobin has also drifted down slowly to 8.9. Anemia panel revealed a normal total iron, ferritin of 423, folate of 19.5, and vitamin B12 of 814. There were no active signs of bleeding.  Nutrition: Registered dietitian consulted and counseled patient on a low-sodium diet.    Procedures:  Echo 06/03/2015: Study Conclusions - Left ventricle: The cavity size was normal. There was mild concentric hypertrophy. Systolic function was vigorous. The estimated ejection fraction was in the range of 65% to 70%. Wall motion was normal; there were no regional wall motion abnormalities. - Mitral valve: There was mild regurgitation. - Left atrium: The atrium was severely dilated. - Right ventricle: The cavity size was moderately dilated. - Right atrium: The atrium was severely dilated. - Tricuspid valve: There was mild regurgitation. - Pulmonary arteries: Mildly elevated pulmonary pressures (41 mmHg).  Consultations:  GI, Dr. Karilyn Stephenson  Discharge Exam: Filed Vitals:   06/09/15 0514  BP: 116/40  Pulse: 86  Temp: 98.1 F (36.7 C)  Resp: 18  02 sat 97 % on room air.  General: Obese 49 year old woman in no acute distress.  Eyes: Positive for scleral icterus.  Cardiovascular: S1, S2, with soft systolic murmur.  Respiratory: Breathing mildly labored at rest (question if this is chronic). No audible crackles or wheezes.  Abdomen: Obese, positive bowel sounds, mild fluid wave, nontender, nondistended.  Musculoskeletal/extremities: Resolved bilateral lower extremity edema.   Neurologic: She is alert and oriented 3. Her speech is clear.    Skin: Mild generalized jaundice. Chronic ecchymosis on both arms; no urticaria.  Neurologic: She is alert and oriented 3. Cranial nerves II through XII are intact.   Discharge Instructions   Discharge Instructions    Diet - low sodium heart healthy    Complete by:  As directed      Discharge instructions    Complete by:  As directed   Weight is up daily. Limit your oral fluids to 50-60 ounces daily.     Increase activity slowly    Complete by:  As directed           Current Discharge Medication List    START taking these medications   Details  metolazone (ZAROXOLYN) 2.5 MG tablet Take 1 tablet (2.5 mg total) by mouth as needed. For weight gain of 3 pounds or more Qty: 30 tablet, Refills: 2    rifaximin (XIFAXAN) 550 MG TABS tablet Take 1 tablet (550 mg total) by mouth 2 (two) times daily after a meal. Qty: 60 tablet, Refills: 2      CONTINUE these medications which have CHANGED   Details  lactulose (CHRONULAC) 10 GM/15ML solution Take 15 mLs (10 g total) by mouth 3 (three) times daily. Qty: 240 mL, Refills: 0    torsemide (DEMADEX) 20 MG tablet Take 1 tablet (20 mg total) by mouth daily.      CONTINUE these medications which have NOT CHANGED   Details  levETIRAcetam (KEPPRA) 500 MG tablet Take 1 tablet (500 mg total) by mouth 2 (two) times daily. Qty: 180 tablet, Refills: 4  levothyroxine (SYNTHROID, LEVOTHROID) 75 MCG tablet Take 75 mcg by mouth daily before breakfast.    LORazepam (ATIVAN) 0.5 MG tablet Take 1 tablet (0.5 mg total) by mouth as needed for anxiety. Qty: 30 tablet, Refills: 0   Associated Diagnoses: NAFLD (nonalcoholic fatty liver disease)    Multiple Vitamin (MULTIVITAMIN WITH MINERALS) TABS tablet Take 1 tablet by mouth daily.    nystatin (MYCOSTATIN) 100000 UNIT/ML suspension Take 5 mLs by mouth daily. SWISH AND SPIT ONCE DAILY Refills: 0    ondansetron (ZOFRAN) 4 MG tablet Take 1 tablet (4 mg total) by mouth every 8 (eight) hours as  needed for nausea or vomiting. Qty: 30 tablet, Refills: 1    pantoprazole (PROTONIX) 40 MG tablet Take 1 tablet (40 mg total) by mouth daily before breakfast. Qty: 30 tablet, Refills: 1    spironolactone (ALDACTONE) 100 MG tablet Take 1 tablet (100 mg total) by mouth daily. Qty: 30 tablet, Refills: 0    traMADol (ULTRAM) 50 MG tablet Take 50 mg by mouth every 6 (six) hours as needed. pain Refills: 0    triamcinolone cream (KENALOG) 0.1 % Apply 1 application topically 2 (two) times daily as needed. itching Qty: 30 g, Refills: 0    ursodiol (ACTIGALL) 300 MG capsule Take 1 capsule (300 mg total) by mouth 3 (three) times daily. Qty: 90 capsule, Refills: 4      STOP taking these medications     zolpidem (AMBIEN) 5 MG tablet        No Known Allergies Follow-up Information    Follow up with DR NAJEEB [facility].   Why:  Follow-up as scheduled   Contact information:   [address] [place] [address]        The results of significant diagnostics from this hospitalization (including imaging, microbiology, ancillary and laboratory) are listed below for reference.    Significant Diagnostic Studies: Dg Chest 2 View  06/05/2015   CLINICAL DATA:  Pulmonary edema  EXAM: CHEST  2 VIEW  COMPARISON:  06/02/2015  FINDINGS: Cardiomediastinal silhouette is stable. No segmental infiltrate. Mild residual interstitial prominence bilaterally without pulmonary edema.  IMPRESSION: No segmental infiltrate. Minimal reticular interstitial prominence bilaterally without pulmonary edema.   Electronically Signed   By: Natasha Mead M.D.   On: 06/05/2015 14:07   Dg Chest 2 View  05/23/2015   CLINICAL DATA:  Worsening dyspnea over the past 2 days.  EXAM: CHEST  2 VIEW  COMPARISON:  03/19/2015  FINDINGS: The heart size and mediastinal contours are within normal limits. Both lungs are clear. The visualized skeletal structures are unremarkable.  IMPRESSION: No active cardiopulmonary  disease.   Electronically Signed   By: Ellery Plunk M.D.   On: 05/23/2015 06:13   Dg Chest Portable 1 View  06/02/2015   CLINICAL DATA:  Acute onset of shortness of breath. Lower extremity swelling. Initial encounter.  EXAM: PORTABLE CHEST - 1 VIEW  COMPARISON:  Chest radiograph performed 05/23/2015  FINDINGS: The lungs are well-aerated. Vascular congestion is noted. Mild bibasilar opacities likely reflect mild interstitial edema. There is no evidence of pleural effusion or pneumothorax.  The cardiomediastinal silhouette is borderline normal in size. No acute osseous abnormalities are seen.  IMPRESSION: Vascular congestion noted. Mild bibasilar opacities likely reflect mild interstitial edema.   Electronically Signed   By: Roanna Raider M.D.   On: 06/02/2015 22:13    Microbiology: No results found for this or any previous visit (from the past 240 hour(s)).  Labs: Basic Metabolic Panel:  Recent Labs Lab 06/05/15 0614 06/06/15 0444 06/07/15 0616 06/08/15 0553 06/09/15 0647  NA 125* 128* 127* 129* 127*  K 4.9 5.0 4.6 5.1 4.6  CL 93* 95* 95* 96* 94*  CO2 24 24 23 24 25   GLUCOSE 98 86 93 87 87  BUN 26* 28* 30* 27* 26*  CREATININE 0.74 0.84 0.93 0.79 0.70  CALCIUM 8.6* 8.9 9.0 9.1 9.1   Liver Function Tests:  Recent Labs Lab 06/02/15 2115 06/03/15 0606 06/04/15 1030 06/09/15 0647  AST 81* 74* 71*  --   ALT 46 43 40  --   ALKPHOS 117 109 100  --   BILITOT 9.8* 9.0* 9.3*  --   PROT 6.4* 6.0* 6.4*  --   ALBUMIN 3.2* 3.0* 3.5 3.8   No results for input(s): LIPASE, AMYLASE in the last 168 hours.  Recent Labs Lab 06/05/15 0609 06/09/15 0650  AMMONIA 31 33   CBC:  Recent Labs Lab 06/02/15 2115 06/03/15 0606 06/04/15 1030 06/05/15 0609 06/06/15 0444 06/07/15 0616  WBC 9.4 7.6 6.8 6.1 6.1 5.8  NEUTROABS 6.2  --   --   --   --   --   HGB 10.1* 9.5* 9.1* 9.2* 8.7* 8.9*  HCT 28.9* 27.0* 25.3* 25.8* 24.3* 25.7*  MCV 97.3 96.8 96.6 97.4 96.0 98.5  PLT 114* 99* 89*  98* 92* 98*   Cardiac Enzymes: No results for input(s): CKTOTAL, CKMB, CKMBINDEX, TROPONINI in the last 168 hours. BNP: BNP (last 3 results)  Recent Labs  03/19/15 1340 05/23/15 0656 06/02/15 2115  BNP 158.0* 101.0* 291.0*    ProBNP (last 3 results) No results for input(s): PROBNP in the last 8760 hours.  CBG: No results for input(s): GLUCAP in the last 168 hours.     Signed:  Nyleah Mcginnis  [facility] 06/09/2015, 12:31 PM

## 2015-06-09 NOTE — Progress Notes (Signed)
Patient discharged home.  IV removed - WNL.  Medications reviewed.  Follow up in place with GI. Instructed to limit fluid per MD orders  Verbalizes understanding of all instructions.  No questions at this time.  Stable to DC home, assisted off unit via WC.

## 2015-06-09 NOTE — Progress Notes (Signed)
Dr. Julieta GuttingHayashi, [facility] notify me that patient has been approved for liver transplant and will be listed. He wants her to have total bilirubin and INR today which I have ordered. Patient given the good news.

## 2015-06-09 NOTE — Care Management Note (Signed)
Case Management Note  Patient Details  Name: Cynthia CravenLinda Stephenson MRN: [id number] Date of Birth: 05/10/1966  Subjective/Objective:                    Action/Plan:   Expected Discharge Date:                  Expected Discharge Plan:  Home/Self Care  In-House Referral:  NA  Discharge planning Services  CM Consult  Post Acute Care Choice:  NA Choice offered to:  NA  DME Arranged:    DME Agency:     HH Arranged:    HH Agency:     Status of Service:  Completed, signed off  Medicare Important Message Given:    Date Medicare IM Given:    Medicare IM give by:    Date Additional Medicare IM Given:    Additional Medicare Important Message give by:     If discussed at Long Length of Stay Meetings, dates discussed:    Additional Comments: Pt discharged home today. No CM needs noted. MD giving pt a prescription for cane and pt will obtain the cane herself. Arlyss QueenBlackwell, Javonne Louissaint [place] Villagerowder, RN 06/09/2015, 12:56 PM

## 2015-06-15 ENCOUNTER — Inpatient Hospital Stay (HOSPITAL_COMMUNITY)
Admission: EM | Admit: 2015-06-15 | Discharge: 2015-06-16 | DRG: 641 | Disposition: A | Discharge status: Other Acute Inpatient Hospital | Payer: Medicaid Other | Attending: Internal Medicine | Admitting: Internal Medicine

## 2015-06-15 ENCOUNTER — Emergency Department (HOSPITAL_COMMUNITY): Payer: Medicaid Other

## 2015-06-15 ENCOUNTER — Encounter (HOSPITAL_COMMUNITY): Payer: Self-pay | Admitting: Emergency Medicine

## 2015-06-15 DIAGNOSIS — E039 Hypothyroidism, unspecified: Secondary | ICD-10-CM | POA: Diagnosis present

## 2015-06-15 DIAGNOSIS — Z8249 Family history of ischemic heart disease and other diseases of the circulatory system: Secondary | ICD-10-CM

## 2015-06-15 DIAGNOSIS — Z803 Family history of malignant neoplasm of breast: Secondary | ICD-10-CM | POA: Diagnosis not present

## 2015-06-15 DIAGNOSIS — K7460 Unspecified cirrhosis of liver: Secondary | ICD-10-CM | POA: Diagnosis present

## 2015-06-15 DIAGNOSIS — K7469 Other cirrhosis of liver: Secondary | ICD-10-CM | POA: Diagnosis present

## 2015-06-15 DIAGNOSIS — Z8719 Personal history of other diseases of the digestive system: Secondary | ICD-10-CM

## 2015-06-15 DIAGNOSIS — Z833 Family history of diabetes mellitus: Secondary | ICD-10-CM | POA: Diagnosis not present

## 2015-06-15 DIAGNOSIS — Z8041 Family history of malignant neoplasm of ovary: Secondary | ICD-10-CM

## 2015-06-15 DIAGNOSIS — E861 Hypovolemia: Secondary | ICD-10-CM | POA: Diagnosis present

## 2015-06-15 DIAGNOSIS — Z6841 Body Mass Index (BMI) 40.0 and over, adult: Secondary | ICD-10-CM

## 2015-06-15 DIAGNOSIS — K760 Fatty (change of) liver, not elsewhere classified: Secondary | ICD-10-CM | POA: Diagnosis present

## 2015-06-15 DIAGNOSIS — Z7682 Awaiting organ transplant status: Secondary | ICD-10-CM

## 2015-06-15 DIAGNOSIS — E871 Hypo-osmolality and hyponatremia: Principal | ICD-10-CM | POA: Diagnosis present

## 2015-06-15 DIAGNOSIS — R531 Weakness: Secondary | ICD-10-CM

## 2015-06-15 DIAGNOSIS — K7290 Hepatic failure, unspecified without coma: Secondary | ICD-10-CM

## 2015-06-15 DIAGNOSIS — D696 Thrombocytopenia, unspecified: Secondary | ICD-10-CM | POA: Diagnosis present

## 2015-06-15 DIAGNOSIS — Z808 Family history of malignant neoplasm of other organs or systems: Secondary | ICD-10-CM

## 2015-06-15 DIAGNOSIS — E860 Dehydration: Secondary | ICD-10-CM | POA: Diagnosis present

## 2015-06-15 DIAGNOSIS — Z8379 Family history of other diseases of the digestive system: Secondary | ICD-10-CM

## 2015-06-15 DIAGNOSIS — D638 Anemia in other chronic diseases classified elsewhere: Secondary | ICD-10-CM | POA: Diagnosis present

## 2015-06-15 LAB — CBC WITH DIFFERENTIAL/PLATELET
BASOS PCT: 0 % (ref 0–1)
Basophils Absolute: 0 10*3/uL (ref 0.0–0.1)
Eosinophils Absolute: 0.2 10*3/uL (ref 0.0–0.7)
Eosinophils Relative: 2 % (ref 0–5)
HCT: 30.2 % — ABNORMAL LOW (ref 36.0–46.0)
HEMOGLOBIN: 10.9 g/dL — AB (ref 12.0–15.0)
Lymphocytes Relative: 15 % (ref 12–46)
Lymphs Abs: 1.4 10*3/uL (ref 0.7–4.0)
MCH: 34.4 pg — ABNORMAL HIGH (ref 26.0–34.0)
MCHC: 36.1 g/dL — ABNORMAL HIGH (ref 30.0–36.0)
MCV: 95.3 fL (ref 78.0–100.0)
MONOS PCT: 12 % (ref 3–12)
Monocytes Absolute: 1.1 10*3/uL — ABNORMAL HIGH (ref 0.1–1.0)
Neutro Abs: 6.5 10*3/uL (ref 1.7–7.7)
Neutrophils Relative %: 71 % (ref 43–77)
Platelets: 133 10*3/uL — ABNORMAL LOW (ref 150–400)
RBC: 3.17 MIL/uL — AB (ref 3.87–5.11)
RDW: 19.4 % — AB (ref 11.5–15.5)
WBC: 9.2 10*3/uL (ref 4.0–10.5)

## 2015-06-15 LAB — COMPREHENSIVE METABOLIC PANEL
ALBUMIN: 3.6 g/dL (ref 3.5–5.0)
ALT: 49 U/L (ref 14–54)
AST: 82 U/L — ABNORMAL HIGH (ref 15–41)
Alkaline Phosphatase: 133 U/L — ABNORMAL HIGH (ref 38–126)
Anion gap: 8 (ref 5–15)
BUN: 38 mg/dL — ABNORMAL HIGH (ref 6–20)
CALCIUM: 9.2 mg/dL (ref 8.9–10.3)
CO2: 25 mmol/L (ref 22–32)
Chloride: 90 mmol/L — ABNORMAL LOW (ref 101–111)
Creatinine, Ser: 1.07 mg/dL — ABNORMAL HIGH (ref 0.44–1.00)
GFR calc non Af Amer: 60 mL/min — ABNORMAL LOW (ref >60)
GLUCOSE: 110 mg/dL — AB (ref 65–99)
Potassium: 5.2 mmol/L — ABNORMAL HIGH (ref 3.5–5.1)
SODIUM: 123 mmol/L — AB (ref 135–145)
Total Bilirubin: 11.6 mg/dL — ABNORMAL HIGH (ref 0.3–1.2)
Total Protein: 6.7 g/dL (ref 6.5–8.1)

## 2015-06-15 LAB — URINALYSIS, ROUTINE W REFLEX MICROSCOPIC
Bilirubin Urine: NEGATIVE
Glucose, UA: NEGATIVE mg/dL
Hgb urine dipstick: NEGATIVE
KETONES UR: NEGATIVE mg/dL
Nitrite: NEGATIVE
Protein, ur: NEGATIVE mg/dL
Specific Gravity, Urine: 1.01 (ref 1.005–1.030)
Urobilinogen, UA: 0.2 mg/dL (ref 0.0–1.0)
pH: 5.5 (ref 5.0–8.0)

## 2015-06-15 LAB — AMMONIA

## 2015-06-15 LAB — BRAIN NATRIURETIC PEPTIDE: B Natriuretic Peptide: 154 pg/mL — ABNORMAL HIGH (ref 0.0–100.0)

## 2015-06-15 LAB — URINE MICROSCOPIC-ADD ON

## 2015-06-15 LAB — PROTIME-INR
INR: 1.59 — ABNORMAL HIGH (ref 0.00–1.49)
Prothrombin Time: 19 seconds — ABNORMAL HIGH (ref 11.6–15.2)

## 2015-06-15 MED ORDER — METOLAZONE 2.5 MG PO TABS: 2.5000 mg | ORAL_TABLET | ORAL | Status: DC | PRN

## 2015-06-15 MED ORDER — LEVOTHYROXINE SODIUM 75 MCG PO TABS
75.0000 ug | ORAL_TABLET | Freq: Every day | ORAL | Status: DC
  Administered 2015-06-16: 75 ug via ORAL
  Filled 2015-06-15: qty 1

## 2015-06-15 MED ORDER — ONDANSETRON HCL 4 MG PO TABS: 4.0000 mg | ORAL_TABLET | Freq: Three times a day (TID) | ORAL | Status: DC | PRN

## 2015-06-15 MED ORDER — NYSTATIN 100000 UNIT/ML MT SUSP
5.0000 mL | Freq: Every day | OROMUCOSAL | Status: DC
  Administered 2015-06-15 – 2015-06-16 (×2): 500000 [IU] via ORAL
  Filled 2015-06-15 (×2): qty 5

## 2015-06-15 MED ORDER — TRAMADOL HCL 50 MG PO TABS
50.0000 mg | ORAL_TABLET | Freq: Four times a day (QID) | ORAL | Status: DC | PRN
Start: 2015-06-15 — End: 2015-06-16

## 2015-06-15 MED ORDER — PANTOPRAZOLE SODIUM 40 MG PO TBEC
40.0000 mg | DELAYED_RELEASE_TABLET | Freq: Every day | ORAL | Status: DC
  Administered 2015-06-16: 40 mg via ORAL
  Filled 2015-06-15: qty 1

## 2015-06-15 MED ORDER — LORAZEPAM 0.5 MG PO TABS
0.5000 mg | ORAL_TABLET | ORAL | Status: DC | PRN
  Administered 2015-06-15: 0.5 mg via ORAL
  Filled 2015-06-15: qty 1

## 2015-06-15 MED ORDER — SODIUM CHLORIDE 0.9 % IV SOLN
INTRAVENOUS | Status: AC
  Administered 2015-06-15: 16:00:00 via INTRAVENOUS

## 2015-06-15 MED ORDER — ZOLPIDEM TARTRATE 5 MG PO TABS
5.0000 mg | ORAL_TABLET | Freq: Every day | ORAL | Status: DC
  Administered 2015-06-15: 5 mg via ORAL
  Filled 2015-06-15: qty 1

## 2015-06-15 MED ORDER — URSODIOL 300 MG PO CAPS
ORAL_CAPSULE | ORAL | Status: AC
  Filled 2015-06-15: qty 1

## 2015-06-15 MED ORDER — LACTULOSE 10 GM/15ML PO SOLN
10.0000 g | Freq: Three times a day (TID) | ORAL | Status: DC
  Administered 2015-06-15: 10 g via ORAL
  Filled 2015-06-15: qty 30

## 2015-06-15 MED ORDER — ONDANSETRON HCL 4 MG/2ML IJ SOLN: 4.0000 mg | Freq: Four times a day (QID) | INTRAMUSCULAR | Status: DC | PRN

## 2015-06-15 MED ORDER — TRIAMCINOLONE ACETONIDE 0.1 % EX CREA
1.0000 "application " | TOPICAL_CREAM | Freq: Two times a day (BID) | CUTANEOUS | Status: DC | PRN
  Filled 2015-06-15: qty 15

## 2015-06-15 MED ORDER — SODIUM CHLORIDE 0.9 % IJ SOLN: 3.0000 mL | Freq: Two times a day (BID) | INTRAMUSCULAR | Status: DC

## 2015-06-15 MED ORDER — RIFAXIMIN 550 MG PO TABS
550.0000 mg | ORAL_TABLET | Freq: Two times a day (BID) | ORAL | Status: DC
  Administered 2015-06-15 – 2015-06-16 (×2): 550 mg via ORAL
  Filled 2015-06-15 (×2): qty 1

## 2015-06-15 MED ORDER — ONDANSETRON HCL 4 MG PO TABS
4.0000 mg | ORAL_TABLET | Freq: Four times a day (QID) | ORAL | Status: DC | PRN
  Administered 2015-06-16: 4 mg via ORAL
  Filled 2015-06-15: qty 1

## 2015-06-15 MED ORDER — TORSEMIDE 20 MG PO TABS
20.0000 mg | ORAL_TABLET | Freq: Every day | ORAL | Status: DC
  Administered 2015-06-16: 20 mg via ORAL
  Filled 2015-06-15: qty 1

## 2015-06-15 MED ORDER — ADULT MULTIVITAMIN W/MINERALS CH
1.0000 | ORAL_TABLET | Freq: Every day | ORAL | Status: DC
  Administered 2015-06-15 – 2015-06-16 (×2): 1 via ORAL
  Filled 2015-06-15 (×2): qty 1

## 2015-06-15 MED ORDER — URSODIOL 300 MG PO CAPS
300.0000 mg | ORAL_CAPSULE | Freq: Three times a day (TID) | ORAL | Status: DC
  Administered 2015-06-15 – 2015-06-16 (×2): 300 mg via ORAL
  Filled 2015-06-15 (×5): qty 1

## 2015-06-15 MED ORDER — SODIUM CHLORIDE 0.9 % IV SOLN
INTRAVENOUS | Status: DC
  Administered 2015-06-16: 02:00:00 via INTRAVENOUS

## 2015-06-15 MED ORDER — SPIRONOLACTONE 25 MG PO TABS
50.0000 mg | ORAL_TABLET | Freq: Every day | ORAL | Status: DC
  Administered 2015-06-16: 50 mg via ORAL
  Filled 2015-06-15: qty 2

## 2015-06-15 MED ORDER — LEVETIRACETAM 500 MG PO TABS
500.0000 mg | ORAL_TABLET | Freq: Two times a day (BID) | ORAL | Status: DC
  Administered 2015-06-15 – 2015-06-16 (×2): 500 mg via ORAL
  Filled 2015-06-15 (×2): qty 1

## 2015-06-15 MED ORDER — SPIRONOLACTONE 100 MG PO TABS: 100.0000 mg | ORAL_TABLET | Freq: Every day | ORAL | Status: DC

## 2015-06-15 NOTE — ED Provider Notes (Signed)
CSN: [id number]     Arrival date & time 06/15/15  1301 History   First MD Initiated Contact with Patient 06/15/15 1319     Chief Complaint  Patient presents with  . Shortness of Breath     (Consider location/radiation/quality/duration/timing/severity/associated sxs/prior Treatment) HPI Comments: Patient is a 49 year old female with history of nonalcoholic cirrhosis. She presents for evaluation of "not feeling well". This has been going on for the past 2 days. She reports feeling short of breath, fatigue, and having no energy. She denies any fevers or chills. She denies any productive cough.  Patient is a 49 y.o. female presenting with shortness of breath. The history is provided by the patient.  Shortness of Breath Severity:  Moderate Onset quality:  Sudden Duration:  2 days Timing:  Constant Progression:  Worsening Chronicity:  Recurrent Context: activity   Relieved by:  Nothing Worsened by:  Nothing tried Ineffective treatments:  None tried Associated symptoms: no abdominal pain, no chest pain, no cough and no fever     Past Medical History  Diagnosis Date  . Hypothyroidism   . Cirrhosis   . Thrombocytopenia   . Seizures     x1 in Dec 2015   Past Surgical History  Procedure Laterality Date  . Abdominal hysterectomy    . Liver biopsy  02/16/2014  . Esophagogastroduodenoscopy N/A 05/01/2015    Procedure: ESOPHAGOGASTRODUODENOSCOPY (EGD);  Surgeon: Malissa Hippo, MD;  Location: [facility];  Service: Endoscopy;  Laterality: N/A;  . Cesearin section    . Cardiac catheterization    . Cesarean section      x2  . Cesarean section     Family History  Problem Relation Age of Onset  . Breast cancer Mother   . Ovarian cancer Mother   . Thyroid cancer Mother   . Diabetes Father   . Heart disease Father   . Skin cancer Father   . Celiac disease Paternal Aunt    History  Substance Use Topics  . Smoking status: Never Smoker   . Smokeless tobacco: Never Used  . Alcohol  Use: No   OB History    No data available     Review of Systems  Constitutional: Negative for fever.  Respiratory: Positive for shortness of breath. Negative for cough.   Cardiovascular: Negative for chest pain.  Gastrointestinal: Negative for abdominal pain.  All other systems reviewed and are negative.     Allergies  Review of patient's allergies indicates no known allergies.  Home Medications   Prior to Admission medications   Medication Sig Start Date End Date Taking? Authorizing Provider  lactulose (CHRONULAC) 10 GM/15ML solution Take 15 mLs (10 g total) by mouth 3 (three) times daily. 06/09/15   Elliot Cousin, MD  levETIRAcetam (KEPPRA) 500 MG tablet Take 1 tablet (500 mg total) by mouth 2 (two) times daily. 02/20/15   Suanne Marker, MD  levothyroxine (SYNTHROID, LEVOTHROID) 75 MCG tablet Take 75 mcg by mouth daily before breakfast.    Historical Provider, MD  LORazepam (ATIVAN) 0.5 MG tablet Take 1 tablet (0.5 mg total) by mouth as needed for anxiety. 05/18/15   Len Blalock, NP  metolazone (ZAROXOLYN) 2.5 MG tablet Take 1 tablet (2.5 mg total) by mouth as needed. For weight gain of 3 pounds or more 06/09/15   Elliot Cousin, MD  Multiple Vitamin (MULTIVITAMIN WITH MINERALS) TABS tablet Take 1 tablet by mouth daily.    Historical Provider, MD  nystatin (MYCOSTATIN) 100000 UNIT/ML suspension Take  5 mLs by mouth daily. SWISH AND SPIT ONCE DAILY 05/25/15   Historical Provider, MD  ondansetron (ZOFRAN) 4 MG tablet Take 1 tablet (4 mg total) by mouth every 8 (eight) hours as needed for nausea or vomiting. 05/14/15   Len Blalockerri L [name], NP  pantoprazole (PROTONIX) 40 MG tablet Take 1 tablet (40 mg total) by mouth daily before breakfast. 05/04/15   Gwenyth BenderKaren M Black, NP  rifaximin (XIFAXAN) 550 MG TABS tablet Take 1 tablet (550 mg total) by mouth 2 (two) times daily after a meal. 06/09/15   Elliot Cousinenise Fisher, MD  spironolactone (ALDACTONE) 100 MG tablet Take 1 tablet (100 mg total) by mouth daily.  05/25/15   Nishant Dhungel, MD  torsemide (DEMADEX) 20 MG tablet Take 1 tablet (20 mg total) by mouth daily. 06/09/15   Elliot Cousinenise Fisher, MD  traMADol (ULTRAM) 50 MG tablet Take 50 mg by mouth every 6 (six) hours as needed. pain 04/08/15   Historical Provider, MD  triamcinolone cream (KENALOG) 0.1 % Apply 1 application topically 2 (two) times daily as needed. itching 05/04/15   Erick BlinksJehanzeb Memon, MD  ursodiol (ACTIGALL) 300 MG capsule Take 1 capsule (300 mg total) by mouth 3 (three) times daily. 04/03/15   Brand Maleserri L [name], NP   BP 109/34 mmHg  Pulse 79  Temp(Src) 97.7 F (36.5 C) (Oral)  Resp 20  Ht 5\' 2"  (1.575 m)  Wt 237 lb (107.502 kg)  BMI 43.34 kg/m2  SpO2 100% Physical Exam  Constitutional: She is oriented to person, place, and time. She appears well-developed and well-nourished. No distress.  Patient is a 49 year old female who appears older than stated age and chronically ill.  HENT:  Head: Normocephalic and atraumatic.  Mouth/Throat: Oropharynx is clear and moist.  Eyes: EOM are normal. Pupils are equal, round, and reactive to light. Scleral icterus is present.  Neck: Normal range of motion. Neck supple.  Cardiovascular: Normal rate and regular rhythm.  Exam reveals no gallop and no friction rub.   No murmur heard. Pulmonary/Chest: Effort normal and breath sounds normal. No respiratory distress. She has no wheezes. She has no rales.  Abdominal: Soft. Bowel sounds are normal. She exhibits no distension. There is no tenderness.  Musculoskeletal: Normal range of motion.  Lymphadenopathy:    She has no cervical adenopathy.  Neurological: She is alert and oriented to person, place, and time. No cranial nerve deficit. She exhibits normal muscle tone.  There is some asterixis noted  Skin: Skin is warm and dry. She is not diaphoretic.  Nursing note and vitals reviewed.   ED Course  Procedures (including critical care time) Labs Review Labs Reviewed  COMPREHENSIVE METABOLIC PANEL  CBC  WITH DIFFERENTIAL/PLATELET  AMMONIA  PROTIME-INR  URINALYSIS, ROUTINE W REFLEX MICROSCOPIC (NOT AT [facility])  BRAIN NATRIURETIC PEPTIDE    Imaging Review No results found.   EKG Interpretation None      MDM   Final diagnoses:  None    Patient is a 49 year old female with history of cirrhosis. She presents for evaluation of weakness. Her workup is essentially unremarkable with the exception of a sodium of 123. She was previously discharged with a sodium of 127 approximately 2 weeks ago. I discussed the case with Dr. Karilyn CotaGosrani who will admit the patient for observation and correction of her hyponatremia.    Geoffery Lyonsouglas Janisse Ghan, MD 06/15/15 [phone]

## 2015-06-15 NOTE — ED Notes (Signed)
Pt reports SOB and weakness that became worse yesterday.

## 2015-06-15 NOTE — H&P (Signed)
[facility] History and Physical  Cynthia Stephenson TML:[id number] DOB: 13-Oct-1966 DOA: 06/15/2015  Referring physician: ER, Dr. Judd Lien PCP: [name], MD   Chief Complaint: Weakness  HPI: Cynthia Stephenson is a 49 y.o. female  This is a 49 year old lady, well-known to this hospital in the last 3 months, who presents with 3-4 day history of weakness. She was just discharged approximately 1 week ago having been admitted with fluid overload. She has a history of cirrhosis, decompensated, secondary to Elita Boone and is awaiting liver transplantation. She told me that she was called approximately a week ago by [facility] and she is now forming on the waiting list, apparently #2. She denies any increased edema but says she gets tired on exertion, possibly dyspneic also. During her last admission, she was fluid overloaded and her diuretics were increased. She was also sent home on metolazone to be used if she gained more than 3 pounds. She says she has not used this. She denies any abdominal pain, nausea, vomiting, hematemesis or rectal bleeding. She denies any fever. She is now being admitted for further management.   Review of Systems:  Apart from symptoms above, all systems negative.  Past Medical History  Diagnosis Date  . Hypothyroidism   . Cirrhosis   . Thrombocytopenia   . Seizures     x1 in Dec 2015   Past Surgical History  Procedure Laterality Date  . Abdominal hysterectomy    . Liver biopsy  02/16/2014  . Esophagogastroduodenoscopy N/A 05/01/2015    Procedure: ESOPHAGOGASTRODUODENOSCOPY (EGD);  Surgeon: Malissa Hippo, MD;  Location: [facility];  Service: Endoscopy;  Laterality: N/A;  . Cesearin section    . Cardiac catheterization    . Cesarean section      x2  . Cesarean section     Social History:  reports that she has never smoked. She has never used smokeless tobacco. She reports that she does not drink alcohol or use illicit drugs.  No Known Allergies  Family History  Problem  Relation Age of Onset  . Breast cancer Mother   . Ovarian cancer Mother   . Thyroid cancer Mother   . Diabetes Father   . Heart disease Father   . Skin cancer Father   . Celiac disease Paternal Aunt     Prior to Admission medications   Medication Sig Start Date End Date Taking? Authorizing Provider  lactulose (CHRONULAC) 10 GM/15ML solution Take 15 mLs (10 g total) by mouth 3 (three) times daily. 06/09/15  Yes Elliot Cousin, MD  levETIRAcetam (KEPPRA) 500 MG tablet Take 1 tablet (500 mg total) by mouth 2 (two) times daily. 02/20/15  Yes Suanne Marker, MD  levothyroxine (SYNTHROID, LEVOTHROID) 75 MCG tablet Take 75 mcg by mouth daily before breakfast.   Yes Historical Provider, MD  LORazepam (ATIVAN) 0.5 MG tablet Take 1 tablet (0.5 mg total) by mouth as needed for anxiety. 05/18/15  Yes Len Blalock, NP  metolazone (ZAROXOLYN) 2.5 MG tablet Take 1 tablet (2.5 mg total) by mouth as needed. For weight gain of 3 pounds or more 06/09/15  Yes Elliot Cousin, MD  Multiple Vitamin (MULTIVITAMIN WITH MINERALS) TABS tablet Take 1 tablet by mouth daily.   Yes Historical Provider, MD  pantoprazole (PROTONIX) 40 MG tablet Take 1 tablet (40 mg total) by mouth daily before breakfast. 05/04/15  Yes Lesle Chris Black, NP  rifaximin (XIFAXAN) 550 MG TABS tablet Take 1 tablet (550 mg total) by mouth 2 (two) times daily after  a meal. 06/09/15  Yes Elliot Cousinenise Fisher, MD  spironolactone (ALDACTONE) 100 MG tablet Take 1 tablet (100 mg total) by mouth daily. 05/25/15  Yes Nishant Dhungel, MD  torsemide (DEMADEX) 20 MG tablet Take 1 tablet (20 mg total) by mouth daily. 06/09/15  Yes Elliot Cousinenise Fisher, MD  traMADol (ULTRAM) 50 MG tablet Take 50 mg by mouth every 6 (six) hours as needed. pain 04/08/15  Yes Historical Provider, MD  ursodiol (ACTIGALL) 300 MG capsule Take 1 capsule (300 mg total) by mouth 3 (three) times daily. 04/03/15  Yes Len Blalockerri L [name], NP  zolpidem (AMBIEN) 5 MG tablet Take 5 mg by mouth at bedtime. 05/27/15  Yes  Historical Provider, MD  nystatin (MYCOSTATIN) 100000 UNIT/ML suspension Take 5 mLs by mouth daily. SWISH AND SPIT ONCE DAILY 05/25/15   Historical Provider, MD  ondansetron (ZOFRAN) 4 MG tablet Take 1 tablet (4 mg total) by mouth every 8 (eight) hours as needed for nausea or vomiting. 05/14/15   Len Blalockerri L [name], NP  triamcinolone cream (KENALOG) 0.1 % Apply 1 application topically 2 (two) times daily as needed. itching 05/04/15   Erick BlinksJehanzeb Memon, MD   Physical Exam: Filed Vitals:   06/15/15 1312 06/15/15 1620  BP: 109/34 119/49  Pulse: 79 84  Temp: 97.7 F (36.5 C) 98 F (36.7 C)  TempSrc: Oral Oral  Resp: 20 20  Height: 5\' 2"  (1.575 m)   Weight: 107.502 kg (237 lb)   SpO2: 100% 100%    Wt Readings from Last 3 Encounters:  06/15/15 107.502 kg (237 lb)  06/09/15 107.7 kg (237 lb 7 oz)  05/25/15 106.867 kg (235 lb 9.6 oz)    General:  Appears  jaundiced. She is alert and orientated. She does not have any signs of hepatic encephalopathy. She does look somewhat clinically dehydrated to me. Eyes: PERRL, normal lids, irises & conjunctiva ENT: grossly normal hearing, lips & tongue Neck: no LAD, masses or thyromegaly Cardiovascular: RRR, no m/r/g. No LE edema. Telemetry: SR, no arrhythmias  Respiratory: CTA bilaterally, no w/r/r. Normal respiratory effort. Abdomen: soft, ntnd Skin: no rash or induration seen on limited exam Musculoskeletal: grossly normal tone BUE/BLE Psychiatric: grossly normal mood and affect, speech fluent and appropriate Neurologic: grossly non-focal.          Labs on Admission:  Basic Metabolic Panel:  Recent Labs Lab 06/09/15 0647 06/15/15 1343  NA 127* 123*  K 4.6 5.2*  CL 94* 90*  CO2 25 25  GLUCOSE 87 110*  BUN 26* 38*  CREATININE 0.70 1.07*  CALCIUM 9.1 9.2   Liver Function Tests:  Recent Labs Lab 06/09/15 0647 06/09/15 1202 06/15/15 1343  AST  --   --  82*  ALT  --   --  49  ALKPHOS  --   --  133*  BILITOT  --  11.6* 11.6*  PROT  --    --  6.7  ALBUMIN 3.8  --  3.6   No results for input(s): LIPASE, AMYLASE in the last 168 hours.  Recent Labs Lab 06/09/15 0650 06/15/15 1343  AMMONIA 33 <9*   CBC:  Recent Labs Lab 06/15/15 1339  WBC 9.2  NEUTROABS 6.5  HGB 10.9*  HCT 30.2*  MCV 95.3  PLT 133*   Cardiac Enzymes: No results for input(s): CKTOTAL, CKMB, CKMBINDEX, TROPONINI in the last 168 hours.  BNP (last 3 results)  Recent Labs  05/23/15 0656 06/02/15 2115 06/15/15 1343  BNP 101.0* 291.0* 154.0*    ProBNP (last 3  results) No results for input(s): PROBNP in the last 8760 hours.  CBG: No results for input(s): GLUCAP in the last 168 hours.  Radiological Exams on Admission: Dg Chest 2 View  06/15/2015   CLINICAL DATA:  Shortness of breath and fever for 3 days.  EXAM: CHEST  2 VIEW  COMPARISON:  June 05, 2015  FINDINGS: The heart size and mediastinal contours are within normal limits. There is mild prominence of pulmonary interstitial unchanged compared to prior exam. There is no frank pulmonary edema. There is no focal pneumonia or pleural effusion. The visualized skeletal structures are unremarkable.  IMPRESSION: Stable mild prominence of pulmonary interstitium bilaterally. No frank pulmonary edema.   Electronically Signed   By: Sherian Rein M.D.   On: 06/15/2015 14:14      Assessment/Plan   1. Weakness. I think this is multifactorial but mostly in this instance secondary to hypovolemia/dehydration. 2. Hyponatremia. This is probably on the basis of hypovolemia/dehydration. She will be given gentle IV fluids. I will reduce her spironolactone to 50 mg daily. Monitor electrolytes closely. 3. Decompensated liver disease. I will ask gastroenterology for any further recommendations. She is now thankfully on the waiting list for liver transplantation and is apparently #2.   Further recommendations will depend on patient's hospital progress.   Code Status: full code.    DVT Prophylaxis:  SCDs.  Family Communication: I discussed the plan with the patient and patient's family at the bedside.    Disposition Plan: home when medically stable.    Time spent: 60 minutes.   Wilson Singer [facility] Pager [phone].

## 2015-06-16 DIAGNOSIS — R531 Weakness: Secondary | ICD-10-CM

## 2015-06-16 LAB — COMPREHENSIVE METABOLIC PANEL
ALBUMIN: 3.4 g/dL — AB (ref 3.5–5.0)
ALT: 48 U/L (ref 14–54)
AST: 78 U/L — ABNORMAL HIGH (ref 15–41)
Alkaline Phosphatase: 121 U/L (ref 38–126)
Anion gap: 10 (ref 5–15)
BILIRUBIN TOTAL: 10.8 mg/dL — AB (ref 0.3–1.2)
BUN: 42 mg/dL — AB (ref 6–20)
CHLORIDE: 89 mmol/L — AB (ref 101–111)
CO2: 21 mmol/L — ABNORMAL LOW (ref 22–32)
Calcium: 8.7 mg/dL — ABNORMAL LOW (ref 8.9–10.3)
Creatinine, Ser: 1.05 mg/dL — ABNORMAL HIGH (ref 0.44–1.00)
GFR calc Af Amer: >60 mL/min (ref >60)
GFR calc non Af Amer: >60 mL/min (ref >60)
Glucose, Bld: 94 mg/dL (ref 65–99)
Potassium: 5.5 mmol/L — ABNORMAL HIGH (ref 3.5–5.1)
SODIUM: 120 mmol/L — AB (ref 135–145)
Total Protein: 6.5 g/dL (ref 6.5–8.1)

## 2015-06-16 LAB — CBC
HCT: 28.4 % — ABNORMAL LOW (ref 36.0–46.0)
Hemoglobin: 10.2 g/dL — ABNORMAL LOW (ref 12.0–15.0)
MCH: 34.1 pg — AB (ref 26.0–34.0)
MCHC: 35.9 g/dL (ref 30.0–36.0)
MCV: 95 fL (ref 78.0–100.0)
Platelets: 146 10*3/uL — ABNORMAL LOW (ref 150–400)
RBC: 2.99 MIL/uL — AB (ref 3.87–5.11)
RDW: 19.8 % — AB (ref 11.5–15.5)
WBC: 11.4 10*3/uL — ABNORMAL HIGH (ref 4.0–10.5)

## 2015-06-16 LAB — PROTIME-INR
INR: 1.66 — ABNORMAL HIGH (ref 0.00–1.49)
PROTHROMBIN TIME: 19.6 s — AB (ref 11.6–15.2)

## 2015-06-16 NOTE — Care Management Note (Signed)
Case Management Note  Patient Details  Name: Cynthia CravenLinda Leap MRN: [id number] Date of Birth: 03-11-1966  Expected Discharge Date:                  Expected Discharge Plan:  Home/Self Care  In-House Referral:  NA  Discharge planning Services  CM Consult  Post Acute Care Choice:  NA Choice offered to:  NA  DME Arranged:    DME Agency:     HH Arranged:    HH Agency:     Status of Service:  Completed, signed off  Medicare Important Message Given:    Date Medicare IM Given:    Medicare IM give by:    Date Additional Medicare IM Given:    Additional Medicare Important Message give by:     If discussed at Long Length of Stay Meetings, dates discussed:    Additional Comments: Pt is from home and independent at baseline. [facility] she [facility] on [facility] transplant list there. No CM needs at this time.  Malcolm Metrohildress, Ashara Lounsbury Demske, RN 06/16/2015, 11:00 AM

## 2015-06-16 NOTE — Plan of Care (Signed)
Problem: Phase I Progression Outcomes Goal: Pain controlled with appropriate interventions Outcome: Progressing Pt denies pain this shift Goal: OOB as tolerated unless otherwise ordered Outcome: Progressing Pt ambulates to bathroom.  Goal: Voiding-avoid urinary catheter unless indicated Outcome: Progressing Pt does not have a foley catheter Goal: Hemodynamically stable Outcome: Progressing Pt blood pressure was running 96/36

## 2015-06-16 NOTE — Discharge Summary (Signed)
Physician Discharge Summary  Olene CravenLinda Yokoyama ZOX:[id number] DOB: 1966/02/02 DOA: 06/15/2015  PCP: Catalina PizzaHALL, ZACH, MD  Admit date: 06/15/2015 Discharge date: 06/16/2015  Time spent: 40 minutes  Recommendations for Outpatient Follow-up:  1. [facility] .   Discharge Diagnoses:  Active Problems:   Hypothyroidism   Other cirrhosis of liver   Decompensation of cirrhosis of liver   Hyponatremia   Weakness   Discharge Condition: stable  Diet recommendation: 2gm sodium  Filed Weights   06/15/15 1312 06/15/15 1654  Weight: 107.502 kg (237 lb) 106.595 kg (235 lb)    History of present illness:  This is a 49 year old lady, well-known to this hospital who presented on 06/15/15 with 3-4 day history of weakness. She was just discharged approximately 1 week prior having been admitted with fluid overload. She has a history of cirrhosis, decompensated, secondary to Elita Booneash and is awaiting liver transplantation. She reported that she was [facility] approximately a [facility] and she is now  on the waiting list, apparently #2. She denied any increased edema but said she gets tired on exertion, possibly dyspneic also. During her last admission, she was fluid overloaded and her diuretics were increased. She was also sent home on metolazone to be used if she gained more than 3 pounds. She said she had not used this. She denied any abdominal pain, nausea, vomiting, hematemesis or rectal bleeding. She denied any fever.   Hospital Course:  1. Weakness. Likely multifactorial but mostly in this instance secondary to hypovolemia/dehydration. She was admitted and provided with IV fluids. Initially home meds continued then diuretics stopped [facility]. [facility] team.  2. Hyponatremia. This is probably on the basis of hypovolemia/dehydration. See above. Of note  her spironolactone was reduced to  to 50 mg daily. Baseline appears to be  124-127. Sodium 120 at discharge 3. Decompensated liver disease. She is now on the waiting list for liver transplantation and is apparently #2. [facility]   Procedures:  none  Consultations:  none  Discharge Exam: Filed Vitals:   06/16/15 0602  BP: 98/42  Pulse: 96  Temp: 98.1 F (36.7 C)  Resp: 20    General: obese slightly jaundiced appears chronically ill Cardiovascular: RRR no MGR No LE edema Respiratory: mild increased work of breathing with conversation. BS slightly distant but clear.   Discharge Instructions    Current Discharge Medication List    CONTINUE these medications which have NOT CHANGED   Details  lactulose (CHRONULAC) 10 GM/15ML solution Take 15 mLs (10 g total) by mouth 3 (three) times daily. Qty: 240 mL, Refills: 0    levETIRAcetam (KEPPRA) 500 MG tablet Take 1 tablet (500 mg total) by mouth 2 (two) times daily. Qty: 180 tablet, Refills: 4    levothyroxine (SYNTHROID, LEVOTHROID) 75 MCG tablet Take 75 mcg by mouth daily before breakfast.    LORazepam (ATIVAN) 0.5 MG tablet Take 1 tablet (0.5 mg total) by mouth as needed for anxiety. Qty: 30 tablet, Refills: 0   Associated Diagnoses: NAFLD (nonalcoholic fatty liver disease)    metolazone (ZAROXOLYN) 2.5 MG tablet Take 1 tablet (2.5 mg total) by mouth as needed. For weight gain of 3 pounds or more Qty: 30 tablet, Refills: 2    Multiple Vitamin (MULTIVITAMIN WITH MINERALS) TABS tablet Take 1 tablet by mouth daily.    pantoprazole (PROTONIX) 40 MG tablet Take 1  tablet (40 mg total) by mouth daily before breakfast. Qty: 30 tablet, Refills: 1    rifaximin (XIFAXAN) 550 MG TABS tablet Take 1 tablet (550 mg total) by mouth 2 (two) times daily after a meal. Qty: 60 tablet, Refills: 2    spironolactone (ALDACTONE) 100 MG tablet Take 1 tablet (100 mg total) by mouth daily. Qty: 30 tablet, Refills: 0    torsemide (DEMADEX) 20 MG tablet Take 1 tablet  (20 mg total) by mouth daily.    traMADol (ULTRAM) 50 MG tablet Take 50 mg by mouth every 6 (six) hours as needed. pain Refills: 0    ursodiol (ACTIGALL) 300 MG capsule Take 1 capsule (300 mg total) by mouth 3 (three) times daily. Qty: 90 capsule, Refills: 4    zolpidem (AMBIEN) 5 MG tablet Take 5 mg by mouth at bedtime. Refills: 0    nystatin (MYCOSTATIN) 100000 UNIT/ML suspension Take 5 mLs by mouth daily. SWISH AND SPIT ONCE DAILY Refills: 0    ondansetron (ZOFRAN) 4 MG tablet Take 1 tablet (4 mg total) by mouth every 8 (eight) hours as needed for nausea or vomiting. Qty: 30 tablet, Refills: 1    triamcinolone cream (KENALOG) 0.1 % Apply 1 application topically 2 (two) times daily as needed. itching Qty: 30 g, Refills: 0       No Known Allergies    The results of significant diagnostics from this hospitalization (including imaging, microbiology, ancillary and laboratory) are listed below for reference.    Significant Diagnostic Studies: Dg Chest 2 View  06/15/2015   CLINICAL DATA:  Shortness of breath and fever for 3 days.  EXAM: CHEST  2 VIEW  COMPARISON:  June 05, 2015  FINDINGS: The heart size and mediastinal contours are within normal limits. There is mild prominence of pulmonary interstitial unchanged compared to prior exam. There is no frank pulmonary edema. There is no focal pneumonia or pleural effusion. The visualized skeletal structures are unremarkable.  IMPRESSION: Stable mild prominence of pulmonary interstitium bilaterally. No frank pulmonary edema.   Electronically Signed   By: Sherian Rein M.D.   On: 06/15/2015 14:14   Dg Chest 2 View  06/05/2015   CLINICAL DATA:  Pulmonary edema  EXAM: CHEST  2 VIEW  COMPARISON:  06/02/2015  FINDINGS: Cardiomediastinal silhouette is stable. No segmental infiltrate. Mild residual interstitial prominence bilaterally without pulmonary edema.  IMPRESSION: No segmental infiltrate. Minimal reticular interstitial prominence bilaterally  without pulmonary edema.   Electronically Signed   By: Natasha Mead M.D.   On: 06/05/2015 14:07   Dg Chest 2 View  05/23/2015   CLINICAL DATA:  Worsening dyspnea over the past 2 days.  EXAM: CHEST  2 VIEW  COMPARISON:  03/19/2015  FINDINGS: The heart size and mediastinal contours are within normal limits. Both lungs are clear. The visualized skeletal structures are unremarkable.  IMPRESSION: No active cardiopulmonary disease.   Electronically Signed   By: Ellery Plunk M.D.   On: 05/23/2015 06:13   Dg Chest Portable 1 View  06/02/2015   CLINICAL DATA:  Acute onset of shortness of breath. Lower extremity swelling. Initial encounter.  EXAM: PORTABLE CHEST - 1 VIEW  COMPARISON:  Chest radiograph performed 05/23/2015  FINDINGS: The lungs are well-aerated. Vascular congestion is noted. Mild bibasilar opacities likely reflect mild interstitial edema. There is no evidence of pleural effusion or pneumothorax.  The cardiomediastinal silhouette is borderline normal in size. No acute osseous abnormalities are seen.  IMPRESSION: Vascular congestion noted. Mild bibasilar opacities likely  reflect mild interstitial edema.   Electronically Signed   By: Roanna Raider M.D.   On: 06/02/2015 22:13    Microbiology: No results found for this or any previous visit (from the past 240 hour(s)).   Labs: Basic Metabolic Panel:  Recent Labs Lab 06/15/15 1343 06/16/15 0556  NA 123* 120*  K 5.2* 5.5*  CL 90* 89*  CO2 25 21*  GLUCOSE 110* 94  BUN 38* 42*  CREATININE 1.07* 1.05*  CALCIUM 9.2 8.7*   Liver Function Tests:  Recent Labs Lab 06/09/15 1202 06/15/15 1343 06/16/15 0556  AST  --  82* 78*  ALT  --  49 48  ALKPHOS  --  133* 121  BILITOT 11.6* 11.6* 10.8*  PROT  --  6.7 6.5  ALBUMIN  --  3.6 3.4*   No results for input(s): LIPASE, AMYLASE in the last 168 hours.  Recent Labs Lab 06/15/15 1343  AMMONIA <9*   CBC:  Recent Labs Lab 06/15/15 1339 06/16/15 0556  WBC 9.2 11.4*  NEUTROABS 6.5   --   HGB 10.9* 10.2*  HCT 30.2* 28.4*  MCV 95.3 95.0  PLT 133* 146*   Cardiac Enzymes: No results for input(s): CKTOTAL, CKMB, CKMBINDEX, TROPONINI in the last 168 hours. BNP: BNP (last 3 results)  Recent Labs  05/23/15 0656 06/02/15 2115 06/15/15 1343  BNP 101.0* 291.0* 154.0*    ProBNP (last 3 results) No results for input(s): PROBNP in the last 8760 hours.  CBG: No results for input(s): GLUCAP in the last 168 hours.     SignedGwenyth Bender  [facility] 06/16/2015, 11:56 AM

## 2015-06-16 NOTE — Progress Notes (Signed)
[facility] to [facility] and [facility]. Patient being transferred to another facility for further evaluation.

## 2015-06-17 ENCOUNTER — Ambulatory Visit (INDEPENDENT_AMBULATORY_CARE_PROVIDER_SITE_OTHER): Payer: BLUE CROSS/BLUE SHIELD | Admitting: Internal Medicine

## 2015-06-19 MED FILL — Ondansetron HCl Inj 4 MG/2ML (2 MG/ML): INTRAMUSCULAR | Qty: 2 | Status: AC

## 2015-06-24 HISTORY — PX: LIVER TRANSPLANT: SHX410

## 2015-08-12 ENCOUNTER — Encounter (INDEPENDENT_AMBULATORY_CARE_PROVIDER_SITE_OTHER): Payer: Self-pay | Admitting: *Deleted

## 2015-10-05 ENCOUNTER — Encounter: Payer: Self-pay | Admitting: Diagnostic Neuroimaging

## 2015-10-05 ENCOUNTER — Ambulatory Visit (INDEPENDENT_AMBULATORY_CARE_PROVIDER_SITE_OTHER): Payer: Medicaid Other | Admitting: Diagnostic Neuroimaging

## 2015-10-05 VITALS — BP 125/83 | HR 91 | Ht 62.0 in | Wt 197.0 lb

## 2015-10-05 DIAGNOSIS — R569 Unspecified convulsions: Secondary | ICD-10-CM | POA: Diagnosis not present

## 2015-10-05 DIAGNOSIS — G40909 Epilepsy, unspecified, not intractable, without status epilepticus: Secondary | ICD-10-CM

## 2015-10-05 NOTE — Patient Instructions (Signed)
[facility] you for [facility] to see [facility] at [facility]. I hope we have been able to provide you high quality care today.  You may receive a patient satisfaction survey over the next few weeks. We would appreciate your feedback and comments so that we may continue to improve ourselves and the health of our patients.  - continue levetiracetam 500mg  twice a day

## 2015-10-05 NOTE — Progress Notes (Signed)
[facility]  PATIENT: Cynthia Stephenson DOB: [birth year]  REFERRING CLINICIAN: Z Hall HISTORY FROM: patient REASON FOR VISIT: follow up  HISTORICAL  CHIEF COMPLAINT:  Chief Complaint  Patient presents with  . Convulsions    rm 7, dgtr-in-law- Kristen  . 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 No seizures or syncope.   UPDATE 02/20/15 (VRP): Since last visit, had 1 more event on Jan 12, 2015, was at home in recliner folding laundry, and then woke up on the ground without realizing or knowing how that happened. She had no significant injuries, tongue biting or incontinence. She thinks she may have had a seizure or some passing out episode. She did not tell anyone about this event. Patient continues on levetiracetam 500 mg at bedtime. She is also had liver biopsy couple days ago but does not have the results. Continues to have fatigue. Does not think she can work anymore.  PRIOR HPI (12/26/14): 49 year old right-handed female here for evaluation of possible seizure. 12/02/2014, patient was feeling badly, had previous day of diarrhea. Patient left work early. After her husband came home they went shopping. While at the store patient had episode of staring, eyes rolling back, falling to the ground, convulsions for 1 minute with tongue biting and incontinence. This was witnessed by patient's husband. Bystanders also witnessed. Apparently a nurse was a bystander and.patient was having a seizure. Per medics were called to scene evaluated patient and took her to the local hospital. Patient apparently was talking, confused, slow to respond after the event. Patient has no memory until arriving in the emergency room. Patient was admitted overnight, had CT scan the blood testing, diagnosed with urinary tract infection, and discharged on level at levetiracetam 500 mg at  bedtime. Patient has family history of seizure in maternal grandfather and maternal uncle. No history of head trauma, encephalitis or meningitis. Also of note patient has been having some abdominal pain over past 1-2 years. Patient has had elevated LFTs in the past. Recently blood work demonstrates elevated bilirubin, low platelet levels, elevated alkaline phosphatase. Patient has noted easy bruising and bleeding lately.   REVIEW OF SYSTEMS: Full 14 system review of systems performed and notable only for tremor.   ALLERGIES: No Known Allergies  HOME MEDICATIONS: Outpatient Prescriptions Prior to Visit  Medication Sig Dispense Refill  . levETIRAcetam (KEPPRA) 500 MG tablet Take 1 tablet (500 mg total) by mouth 2 (two) times daily. 180 tablet 4  . nystatin (MYCOSTATIN) 100000 UNIT/ML suspension Take 5 mLs by mouth daily. SWISH AND SPIT ONCE DAILY  0  . ondansetron (ZOFRAN) 4 MG tablet Take 1 tablet (4 mg total) by mouth every 8 (eight) hours as needed for nausea or vomiting. 30 tablet 1  . ursodiol (ACTIGALL) 300 MG capsule Take 1 capsule (300 mg total) by mouth 3 (three) times daily. 90 capsule 4  . lactulose (CHRONULAC) 10 GM/15ML solution Take 15 mLs (10 g total) by mouth 3 (three) times daily. 240 mL 0  . levothyroxine (SYNTHROID, LEVOTHROID) 75 MCG tablet Take 75 mcg by mouth daily before breakfast.    . LORazepam (ATIVAN) 0.5 MG tablet Take 1 tablet (0.5 mg total) by mouth as needed for anxiety. 30 tablet 0  . pantoprazole (PROTONIX) 40 MG tablet Take 1 tablet (40 mg total) by mouth daily before breakfast. 30  tablet 1  . rifaximin (XIFAXAN) 550 MG TABS tablet Take 1 tablet (550 mg total) by mouth 2 (two) times daily after a meal. 60 tablet 2  . traMADol (ULTRAM) 50 MG tablet Take 50 mg by mouth every 6 (six) hours as needed. pain  0  . triamcinolone cream (KENALOG) 0.1 % Apply 1 application topically 2 (two) times daily as needed. itching 30 g 0  . zolpidem (AMBIEN) 5 MG tablet Take 5 mg  by mouth at bedtime.  0   No facility-administered medications prior to visit.    PAST MEDICAL HISTORY: Past Medical History  Diagnosis Date  . Hypothyroidism   . Cirrhosis (HCC)   . Thrombocytopenia (HCC)   . Seizures (HCC)     x1 in Dec 2015    PAST SURGICAL HISTORY: Past Surgical History  Procedure Laterality Date  . Abdominal hysterectomy    . Liver biopsy  02/16/2014  . Esophagogastroduodenoscopy N/A 05/01/2015    Procedure: ESOPHAGOGASTRODUODENOSCOPY (EGD);  Surgeon: Malissa Hippo, MD;  Location: [facility];  Service: Endoscopy;  Laterality: N/A;  . Cesearin section    . Cardiac catheterization    . Cesarean section      x2  . Cesarean section    . Liver transplant  06/24/15    [facility]    FAMILY HISTORY: Family History  Problem Relation Age of Onset  . Breast cancer Mother   . Ovarian cancer Mother   . Thyroid cancer Mother   . Diabetes Father   . Heart disease Father   . Skin cancer Father   . Celiac disease Paternal Aunt     SOCIAL HISTORY:  Social History   Social History  . Marital Status: Married    Spouse Name: Loraine Leriche  . Number of Children: 3  . Years of Education: 12   Occupational History  .  Other    [employer]   Social History Main Topics  . Smoking status: Never Smoker   . Smokeless tobacco: Never Used  . Alcohol Use: No  . Drug Use: No  . Sexual Activity: Not on file   Other Topics Concern  . Not on file   Social History Narrative   Lives at home with Husband    caffeine use: yes     PHYSICAL EXAM  Filed Vitals:   10/05/15 1349  BP: 125/83  Pulse: 91  Height:  (1.575 m)  Weight: 197 lb (89.359 kg)    Body mass index is 36.02 kg/(m^2).  No exam data present  No flowsheet data found.  GENERAL EXAM: Patient is in no distress; well developed, nourished and groomed; neck is supple  CARDIOVASCULAR: Regular rate and rhythm, no murmurs, no carotid  bruits  NEUROLOGIC: MENTAL STATUS: awake, alert, language fluent, comprehension intact, naming intact, fund of knowledge appropriate CRANIAL NERVE: no papilledema on fundoscopic exam, pupils equal and reactive to light, visual fields full to confrontation, extraocular muscles intact, no nystagmus, facial sensation and strength symmetric, hearing intact, palate elevates symmetrically, uvula midline, shoulder shrug symmetric, tongue midline. MOTOR: normal bulk and tone, full strength in the BUE, BLE; POSTURAL AND ACTION TREMOR IN BUE SENSORY: normal and symmetric to light touch, temperature, vibration COORDINATION: finger-nose-finger, fine finger movements normal REFLEXES: deep tendon reflexes present and symmetric GAIT/STATION: narrow based gait; able to walk on toes, heels and tandem; romberg is negative    DIAGNOSTIC DATA (LABS, IMAGING, TESTING) - I reviewed patient records, labs, notes,  testing and imaging myself where available.  Lab Results  Component Value Date   WBC 11.4* 06/16/2015   HGB 10.2* 06/16/2015   HCT 28.4* 06/16/2015   MCV 95.0 06/16/2015   PLT 146* 06/16/2015      Component Value Date/Time   NA 120* 06/16/2015 0556   K 5.5* 06/16/2015 0556   CL 89* 06/16/2015 0556   CO2 21* 06/16/2015 0556   GLUCOSE 94 06/16/2015 0556   BUN 42* 06/16/2015 0556   CREATININE 1.05* 06/16/2015 0556   CREATININE 0.96 05/18/2015 0947   CALCIUM 8.7* 06/16/2015 0556   PROT 6.5 06/16/2015 0556   ALBUMIN 3.4* 06/16/2015 0556   AST 78* 06/16/2015 0556   ALT 48 06/16/2015 0556   ALKPHOS 121 06/16/2015 0556   BILITOT 10.8* 06/16/2015 0556   GFRNONAA >60 06/16/2015 0556   GFRAA >60 06/16/2015 0556   No results found for: CHOL, HDL, LDLCALC, LDLDIRECT, TRIG, CHOLHDL No results found for: FAOZ3Y Lab Results  Component Value Date   VITAMINB12 814 06/05/2015   Lab Results  Component Value Date   TSH 3.738 06/02/2015    I reviewed images myself and agree with interpretation.  -VRP  01/21/15 MRI brain  1. Few scattered punctate nonspecific foci of gliosis in the periventricular and some cortical white matter. No abnormal lesions are seen on post contrast views.  2. On coronal views no mesial temporal sclerosis or hippocampal atrophy.  3. Possible small (3mm) choroidal fissure cyst on the right. 4. No acute findings.  12/29/14 EEG - normal     ASSESSMENT AND PLAN  49 y.o. year old female here with new onset seizure 12/02/2014, in setting of urinary tract infection, diarrheal illness. Then with second event of syncope vs seizure on 01/12/15. Now stable on levetiracetam  BID and no seizures. Now s/p liver transplant 06/24/15 and doing well. I would recommend continuing anti-seizure medication for now. After 2-3 years of seizure freedom, may consider tapering off.    PLAN: - continue LEV to  BID  Return in about 6 months (around 04/04/2016).    Suanne Marker, MD 10/05/2015, 2:39 PM Certified in Neurology, Neurophysiology and Neuroimaging  [facility] [address] [place], [place] [address] [phone]

## 2016-07-18 ENCOUNTER — Ambulatory Visit (INDEPENDENT_AMBULATORY_CARE_PROVIDER_SITE_OTHER): Payer: Medicaid Other | Admitting: Diagnostic Neuroimaging

## 2016-07-18 ENCOUNTER — Encounter: Payer: Self-pay | Admitting: Diagnostic Neuroimaging

## 2016-07-18 VITALS — BP 116/73 | HR 68 | Wt 231.2 lb

## 2016-07-18 DIAGNOSIS — R569 Unspecified convulsions: Secondary | ICD-10-CM

## 2016-07-18 DIAGNOSIS — G40909 Epilepsy, unspecified, not intractable, without status epilepticus: Secondary | ICD-10-CM

## 2016-07-18 MED ORDER — LEVETIRACETAM 250 MG PO TABS: 250.0000 mg | ORAL_TABLET | Freq: Two times a day (BID) | ORAL | 4 refills | Status: AC

## 2016-07-18 NOTE — Progress Notes (Signed)
[facility]  PATIENT: Cynthia CravenLinda Stephenson DOB: [birth year]  REFERRING CLINICIAN: Z Hall HISTORY FROM: patient REASON FOR VISIT: follow up  HISTORICAL  CHIEF COMPLAINT:  Chief Complaint  Patient presents with  . Other    rm 7, husband -Loraine LericheMark, "no seizures since last office visit"  . Follow-up    HISTORY OF PRESENT ILLNESS:   UPDATE 07/18/16: Since last visit, doing well. On LEV 250mg  BID (which has been this dose since May 2017; not 500mg  BID). Overall doing well.   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 No seizures or syncope.   UPDATE 02/20/15 (VRP): Since last visit, had 1 more event on Jan 12, 2015, was at home in recliner folding laundry, and then woke up on the ground without realizing or knowing how that happened. She had no significant injuries, tongue biting or incontinence. She thinks she may have had a seizure or some passing out episode. She did not tell anyone about this event. Patient continues on levetiracetam 500 mg at bedtime. She is also had liver biopsy couple days ago but does not have the results. Continues to have fatigue. Does not think she can work anymore.  PRIOR HPI (12/26/14): 50 year old right-handed female here for evaluation of possible seizure. 12/02/2014, patient was feeling badly, had previous day of diarrhea. Patient left work early. After her husband came home they went shopping. While at the store patient had episode of staring, eyes rolling back, falling to the ground, convulsions for 1 minute with tongue biting and incontinence. This was witnessed by patient's husband. Bystanders also witnessed. Apparently a nurse was a bystander and.patient was having a seizure. Per medics were called to scene evaluated patient and took her to the local hospital. Patient apparently was talking, confused, slow to respond after the event. Patient has no memory until arriving in the emergency  room. Patient was admitted overnight, had CT scan the blood testing, diagnosed with urinary tract infection, and discharged on level at levetiracetam 500 mg at bedtime. Patient has family history of seizure in maternal grandfather and maternal uncle. No history of head trauma, encephalitis or meningitis. Also of note patient has been having some abdominal pain over past 1-2 years. Patient has had elevated LFTs in the past. Recently blood work demonstrates elevated bilirubin, low platelet levels, elevated alkaline phosphatase. Patient has noted easy bruising and bleeding lately.   REVIEW OF SYSTEMS: Full 14 system review of systems performed and notable only for tremor memory loss depression anxiety fatigue insomnia snoring.    ALLERGIES: No Known Allergies  HOME MEDICATIONS: Outpatient Medications Prior to Visit  Medication Sig Dispense Refill  . Cholecalciferol (VITAMIN D3) 2000 UNITS capsule Take 2,000 Units by mouth.    . magnesium oxide (MAG-OX) 400 MG tablet Take 400 mg by mouth.    . mycophenolate (MYFORTIC) 180 MG EC tablet Take 180 mg by mouth.    . nystatin (MYCOSTATIN) 100000 UNIT/ML suspension Take 5 mLs by mouth daily. SWISH AND SPIT ONCE DAILY  0  . ondansetron (ZOFRAN) 4 MG tablet Take 1 tablet (4 mg total) by mouth every 8 (eight) hours as needed for nausea or vomiting. 30 tablet 1  . tacrolimus (PROGRAF) 1 MG capsule Take 7 mg by mouth.    . loperamide (IMODIUM A-D) 2 MG tablet Take 2 mg by mouth.    . Melatonin 3 MG TABS 3 mg. Take 1-2 30 min before sleep    .  ursodiol (ACTIGALL) 300 MG capsule Take 300 mg by mouth.    . gabapentin (NEURONTIN) 100 MG capsule Take 100 mg by mouth 3 (three) times daily. 10/05/15 DO NOT START YET    . levETIRAcetam (KEPPRA) 500 MG tablet Take 1 tablet (500 mg total) by mouth 2 (two) times daily. 180 tablet 4   No facility-administered medications prior to visit.     PAST MEDICAL HISTORY: Past Medical History:  Diagnosis Date  . Cirrhosis  (HCC)   . Hypothyroidism   . Seizures (HCC)    x1 in Dec 2015  . Thrombocytopenia (HCC)     PAST SURGICAL HISTORY: Past Surgical History:  Procedure Laterality Date  . ABDOMINAL HYSTERECTOMY    . CARDIAC CATHETERIZATION    . CESAREAN SECTION     x2  . CESAREAN SECTION    . cesearin section    . ESOPHAGOGASTRODUODENOSCOPY N/A 05/01/2015   Procedure: ESOPHAGOGASTRODUODENOSCOPY (EGD);  Surgeon: Malissa Hippo, MD;  Location: [facility];  Service: Endoscopy;  Laterality: N/A;  . LIVER BIOPSY  02/16/2014  . LIVER TRANSPLANT  06/24/15   [facility]    FAMILY HISTORY: Family History  Problem Relation Age of Onset  . Breast cancer Mother   . Ovarian cancer Mother   . Thyroid cancer Mother   . Diabetes Father   . Heart disease Father   . Skin cancer Father   . Celiac disease Paternal Aunt     SOCIAL HISTORY:  Social History   Social History  . Marital status: Married    Spouse name: Loraine Leriche  . Number of children: 3  . Years of education: 12   Occupational History  .  Other    [employer]   Social History Main Topics  . Smoking status: Never Smoker  . Smokeless tobacco: Never Used  . Alcohol use No  . Drug use: No  . Sexual activity: Not on file   Other Topics Concern  . Not on file   Social History Narrative   Lives at home with Husband    caffeine use: yes     PHYSICAL EXAM  Vitals:   07/18/16 1357  BP: 116/73  Pulse: 68  Weight: 231 lb 3.2 oz (104.9 kg)   Wt Readings from Last 3 Encounters:  07/18/16 231 lb 3.2 oz (104.9 kg)  10/05/15 197 lb (89.4 kg)  06/15/15 235 lb (106.6 kg)   Body mass index is 42.29 kg/m.  No exam data present  No flowsheet data found.  GENERAL EXAM: Patient is in no distress; well developed, nourished and groomed; neck is supple  CARDIOVASCULAR: Regular rate and rhythm, no murmurs, no carotid bruits  NEUROLOGIC: MENTAL STATUS: awake, alert, language fluent, comprehension  intact, naming intact, fund of knowledge appropriate CRANIAL NERVE: no papilledema on fundoscopic exam, pupils equal and reactive to light, visual fields full to confrontation, extraocular muscles intact, no nystagmus, facial sensation and strength symmetric, hearing intact, palate elevates symmetrically, uvula midline, shoulder shrug symmetric, tongue midline. MOTOR: normal bulk and tone, full strength in the BUE, BLE; POSTURAL AND ACTION TREMOR IN BUE SENSORY: normal and symmetric to light touch, temperature, vibration COORDINATION: finger-nose-finger, fine finger movements normal REFLEXES: deep tendon reflexes present and symmetric GAIT/STATION: narrow based gait; able to walk tandem    DIAGNOSTIC DATA (LABS, IMAGING, TESTING) - I reviewed patient records, labs, notes, testing and imaging myself where available.  Lab Results  Component Value Date   WBC 11.4 (H)  06/16/2015   HGB 10.2 (L) 06/16/2015   HCT 28.4 (L) 06/16/2015   MCV 95.0 06/16/2015   PLT 146 (L) 06/16/2015      Component Value Date/Time   NA 120 (L) 06/16/2015 0556   K 5.5 (H) 06/16/2015 0556   CL 89 (L) 06/16/2015 0556   CO2 21 (L) 06/16/2015 0556   GLUCOSE 94 06/16/2015 0556   BUN 42 (H) 06/16/2015 0556   CREATININE 1.05 (H) 06/16/2015 0556   CREATININE 0.96 05/18/2015 0947   CALCIUM 8.7 (L) 06/16/2015 0556   PROT 6.5 06/16/2015 0556   ALBUMIN 3.4 (L) 06/16/2015 0556   AST 78 (H) 06/16/2015 0556   ALT 48 06/16/2015 0556   ALKPHOS 121 06/16/2015 0556   BILITOT 10.8 (H) 06/16/2015 0556   GFRNONAA >60 06/16/2015 0556   GFRAA >60 06/16/2015 0556   No results found for: CHOL, HDL, LDLCALC, LDLDIRECT, TRIG, CHOLHDL No results found for: ZOXW9U Lab Results  Component Value Date   VITAMINB12 814 06/05/2015   Lab Results  Component Value Date   TSH 3.738 06/02/2015    I reviewed images myself and agree with interpretation. -VRP  01/21/15 MRI brain  1. Few scattered punctate nonspecific foci of gliosis in  the periventricular and some cortical white matter. No abnormal lesions are seen on post contrast views.  2. On coronal views no mesial temporal sclerosis or hippocampal atrophy.  3. Possible small (3mm) choroidal fissure cyst on the right. 4. No acute findings.  12/29/14 EEG - normal     ASSESSMENT AND PLAN  50 y.o. year old female here with new onset seizure 12/02/2014, in setting of urinary tract infection, diarrheal illness. Then with second event of syncope vs seizure on 01/12/15. Now stable on levetiracetam  BID and no seizures. Now s/p liver transplant 06/24/15 and doing well. I would recommend continuing anti-seizure medication for now. After 2-3 years of seizure freedom, may consider tapering off.   Dx:  Convulsions, unspecified convulsion type (HCC)  Seizure disorder (HCC)    PLAN: I spent 15 minutes of face to face time with patient. Greater than 50% of time was spent in counseling and coordination of care with patient. In summary we discussed:  - continue LEV to  BID - consider sleep study (snoring, insomnia, fatigue)  Meds ordered this encounter  Medications  . levETIRAcetam (KEPPRA) 250 MG tablet    Sig: Take 1 tablet (250 mg total) by mouth 2 (two) times daily.    Dispense:  180 tablet    Refill:  4   Return in about 1 year (around 07/18/2017).    Suanne Marker, MD 07/18/2016, 2:27 PM Certified in Neurology, Neurophysiology and Neuroimaging  [facility] [address], [address] [place], [place] [address] [phone]

## 2016-07-18 NOTE — Patient Instructions (Signed)
-  continue current medications

## 2017-01-10 ENCOUNTER — Telehealth (INDEPENDENT_AMBULATORY_CARE_PROVIDER_SITE_OTHER): Payer: Self-pay | Admitting: Internal Medicine

## 2017-01-10 NOTE — Telephone Encounter (Signed)
Patient would like Dr. Karilyn Cotaehman to recommend a surgeon for her hernia.  [phone]

## 2017-01-10 NOTE — Telephone Encounter (Signed)
This will be reviewed with Dr.Rehman .

## 2017-01-12 NOTE — Telephone Encounter (Signed)
Left detailed message for patient that [facility] would be ok

## 2017-01-12 NOTE — Telephone Encounter (Signed)
Sent to [place] to  Call patient

## 2017-01-18 IMAGING — US US ABDOMEN LIMITED
1 series · 14 of 25 positions shown · non-contrast
Comparison: MRCP 03/16/2015.  Abdominal CT 02/11/2015.

CLINICAL DATA: The shortness of breath and right upper quadrant
abdominal pain. History of liver biopsy. Evaluate for ascites.

EXAM:
US ABDOMEN LIMITED - RIGHT UPPER QUADRANT

[Series 1: us abdomen limited · 0.24mm/px · 14 of 47 slices shown]
[im 1/47]
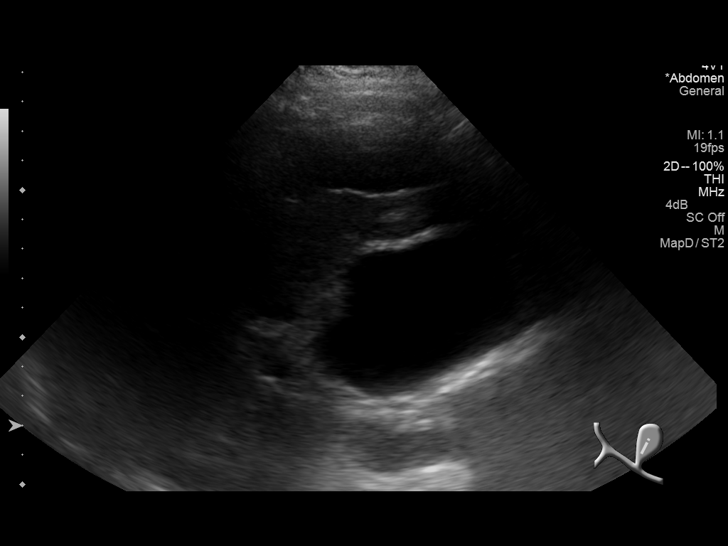
[im 4/47]
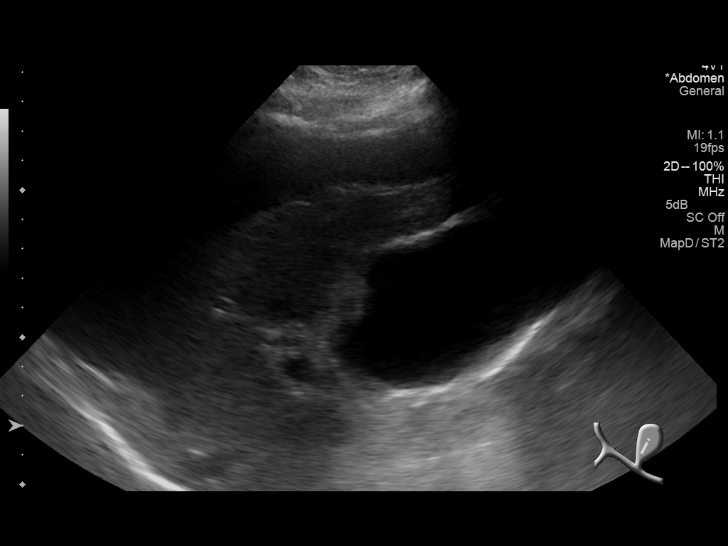
[im 8/47]
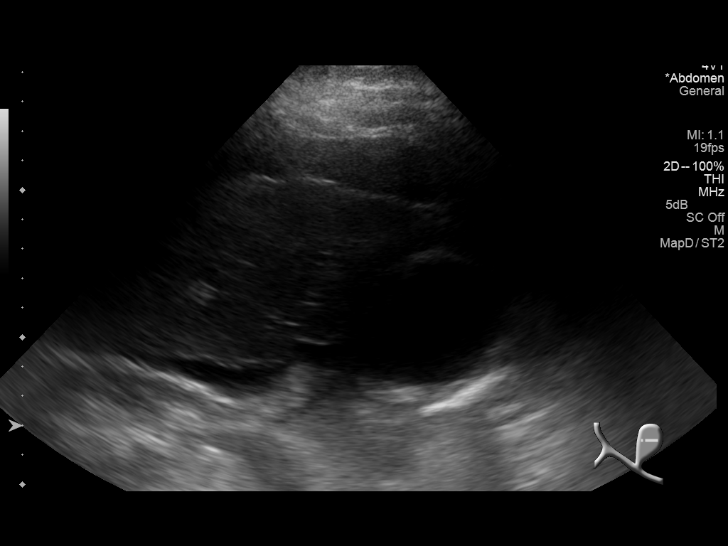
[im 12/47]
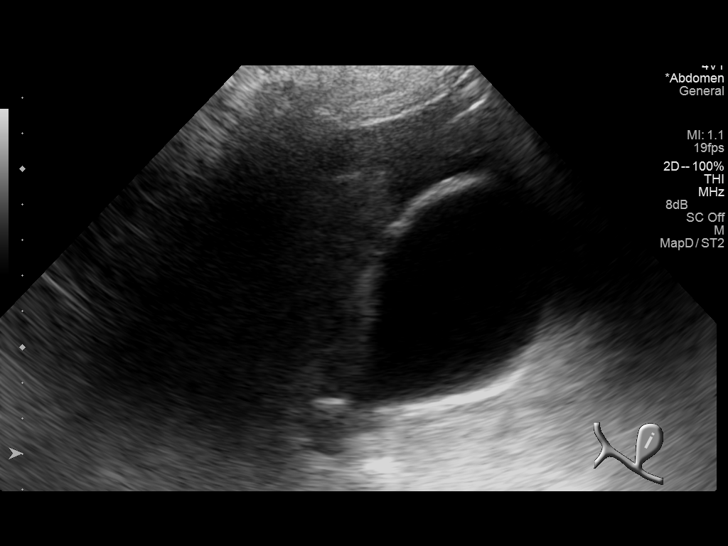
[im 16/47]
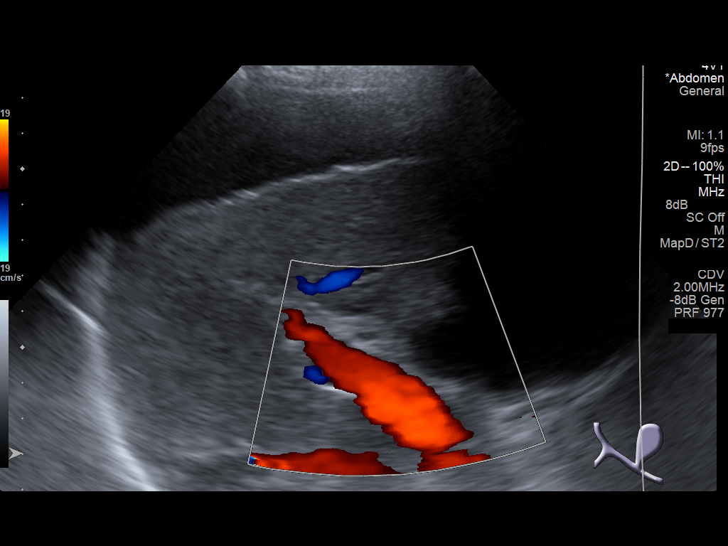
[im 18/47]
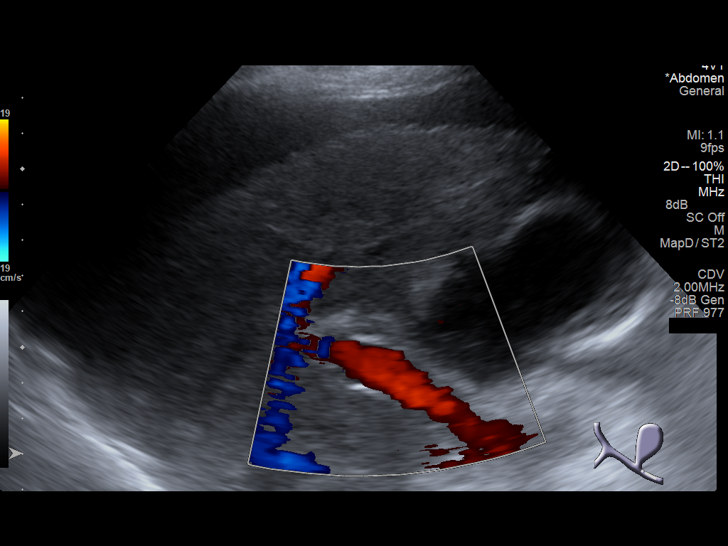
[im 22/47]
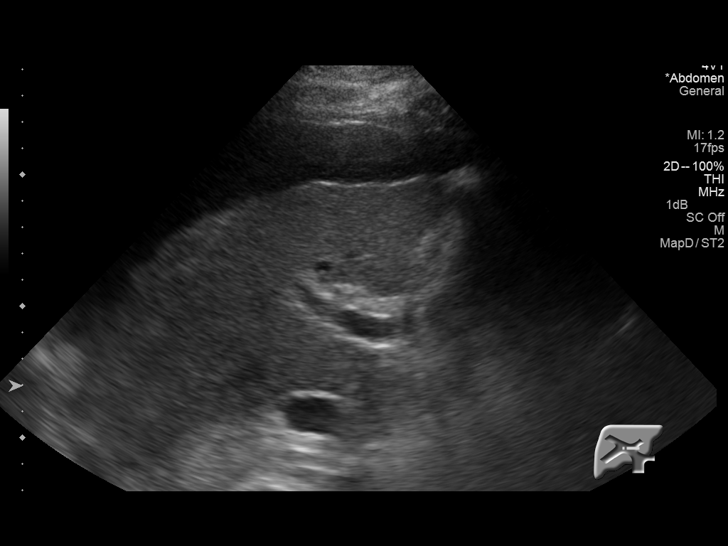
[im 25/47]
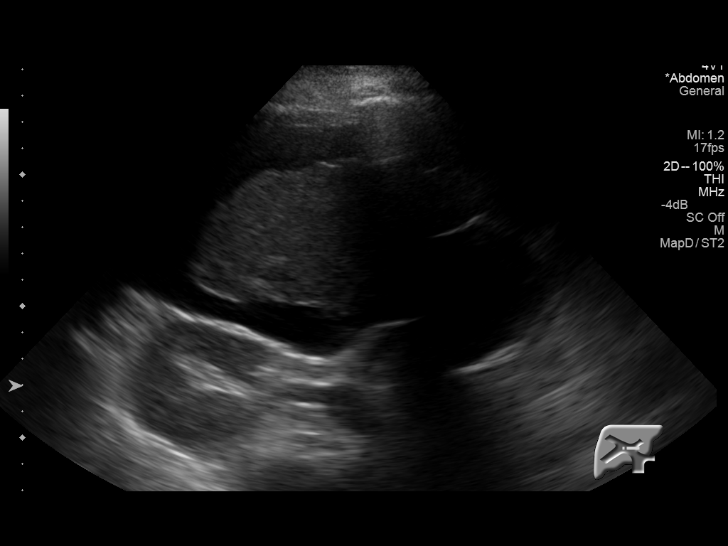
[im 29/47]
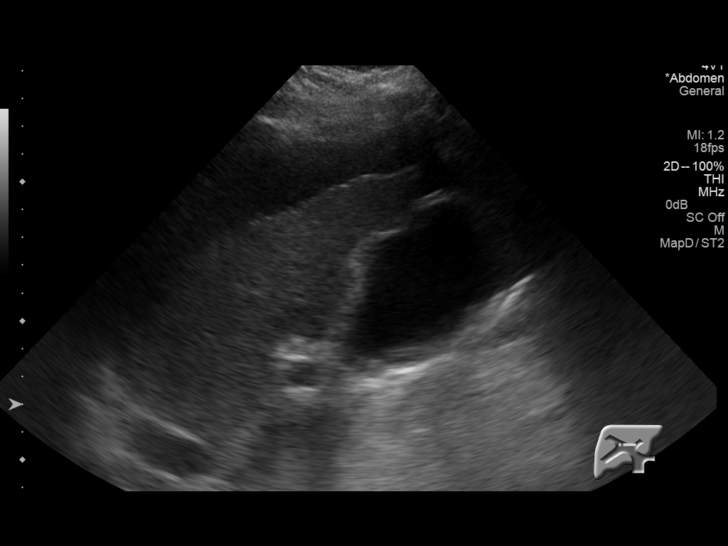
[im 31/47]
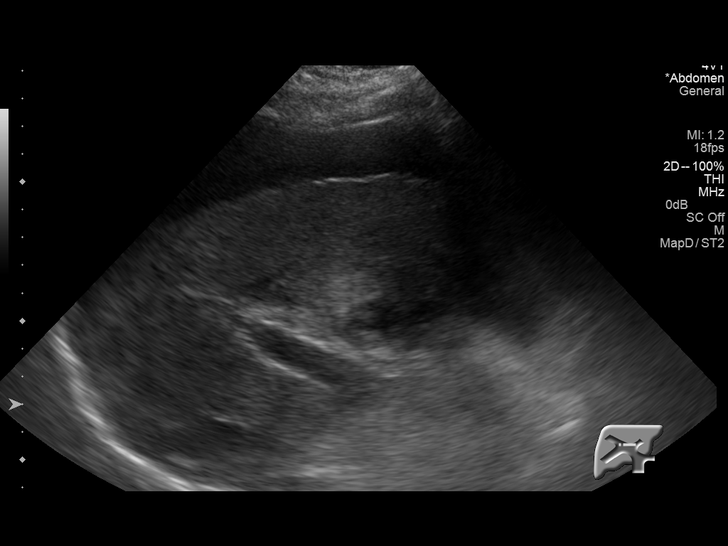
[im 35/47]
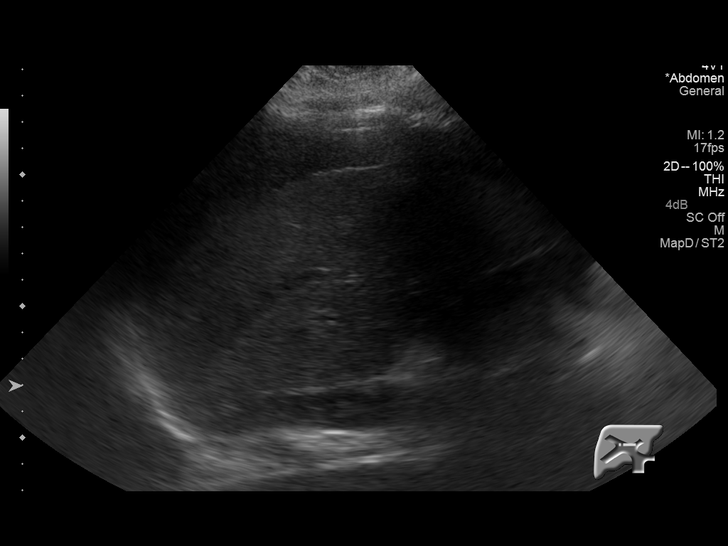
[im 39/47]
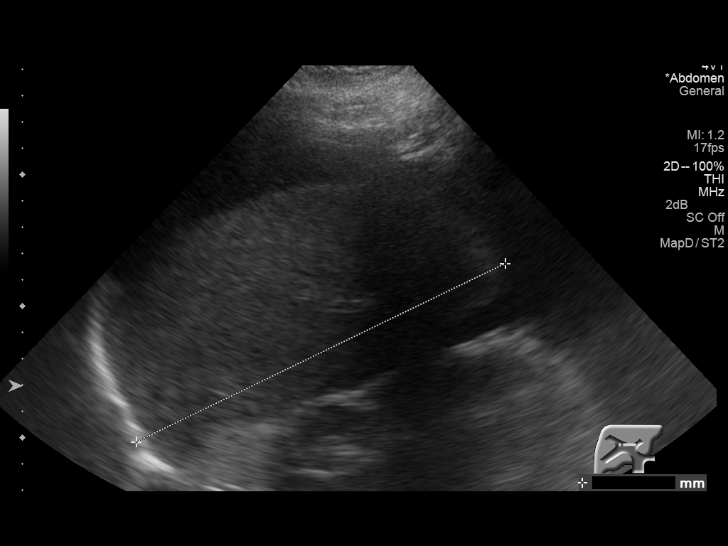
[im 43/47]
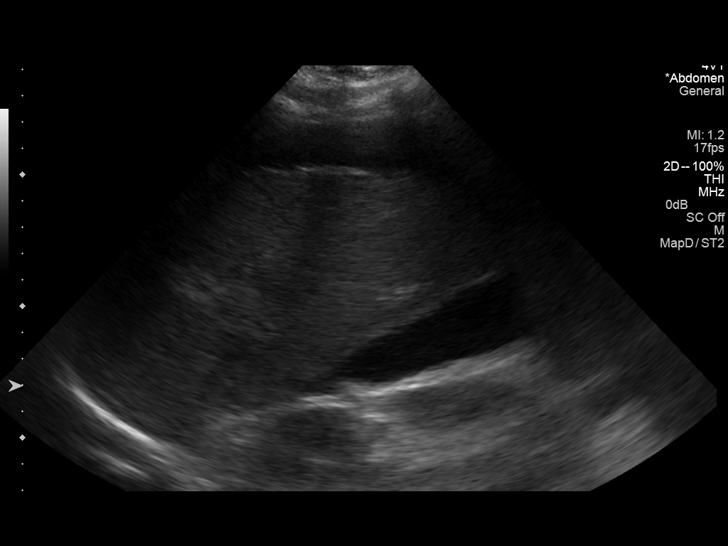
[im 47/47]
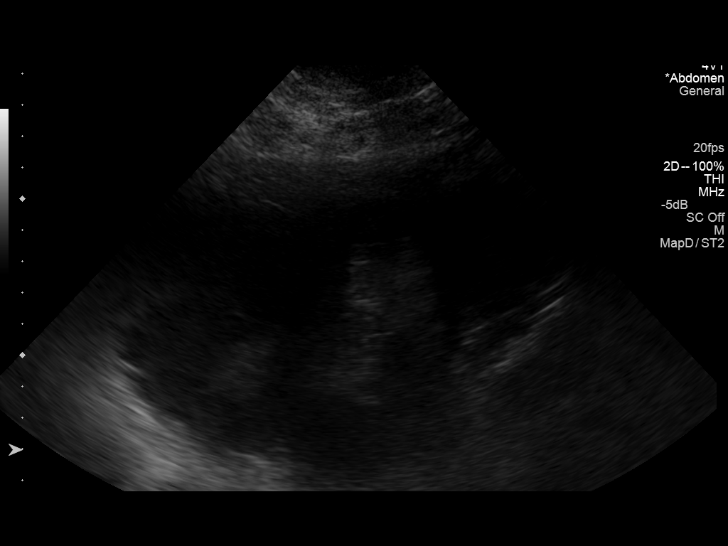

[14 of 25 positions shown; findings below may reference images not displayed]

FINDINGS: Gallbladder:

There is mild nonspecific gallbladder wall thickening to 4 mm. No
evidence of gallstones, focal pericholecystic fluid or sonographic
Murphy's sign.

Common bile duct:

Diameter: 2.4 mm

Liver:

The liver is shrunken with contour irregularity and increased
echogenicity, corresponding with cirrhosis on prior studies. No
focal lesions identified.

Moderate ascites identified in all 4 quadrants of the abdomen.
IMPRESSION: 1. Moderate ascites as demonstrated on recent prior studies.
2. Cirrhosis.
3. Mild nonspecific gallbladder wall thickening attributed to liver
disease and ascites.

## 2017-02-10 ENCOUNTER — Other Ambulatory Visit (HOSPITAL_COMMUNITY): Payer: Self-pay | Admitting: General Surgery

## 2017-02-10 DIAGNOSIS — Z944 Liver transplant status: Secondary | ICD-10-CM

## 2017-02-10 DIAGNOSIS — K432 Incisional hernia without obstruction or gangrene: Secondary | ICD-10-CM

## 2017-02-15 ENCOUNTER — Ambulatory Visit (HOSPITAL_COMMUNITY)
Admission: RE | Admit: 2017-02-15 | Discharge: 2017-02-15 | Disposition: A | Discharge status: Home / Self Care | Payer: Medicaid Other | Source: Ambulatory Visit | Attending: General Surgery | Admitting: General Surgery

## 2017-02-15 DIAGNOSIS — E279 Disorder of adrenal gland, unspecified: Secondary | ICD-10-CM | POA: Diagnosis not present

## 2017-02-15 DIAGNOSIS — Z944 Liver transplant status: Secondary | ICD-10-CM | POA: Diagnosis present

## 2017-02-15 DIAGNOSIS — K432 Incisional hernia without obstruction or gangrene: Secondary | ICD-10-CM

## 2017-02-15 DIAGNOSIS — K439 Ventral hernia without obstruction or gangrene: Secondary | ICD-10-CM | POA: Diagnosis not present

## 2017-02-15 MED ORDER — IOPAMIDOL (ISOVUE-300) INJECTION 61%
INTRAVENOUS | Status: AC
  Administered 2017-02-15: 100 mL
  Filled 2017-02-15: qty 100

## 2017-02-20 ENCOUNTER — Ambulatory Visit (HOSPITAL_COMMUNITY): Payer: Medicaid Other

## 2017-03-24 IMAGING — DX DG CHEST 2V
2 series · 2 of 2 positions shown · non-contrast
Comparison: 03/19/2015

CLINICAL DATA: Worsening dyspnea over the past 2 days.

EXAM:
CHEST  2 VIEW

[chest pa]
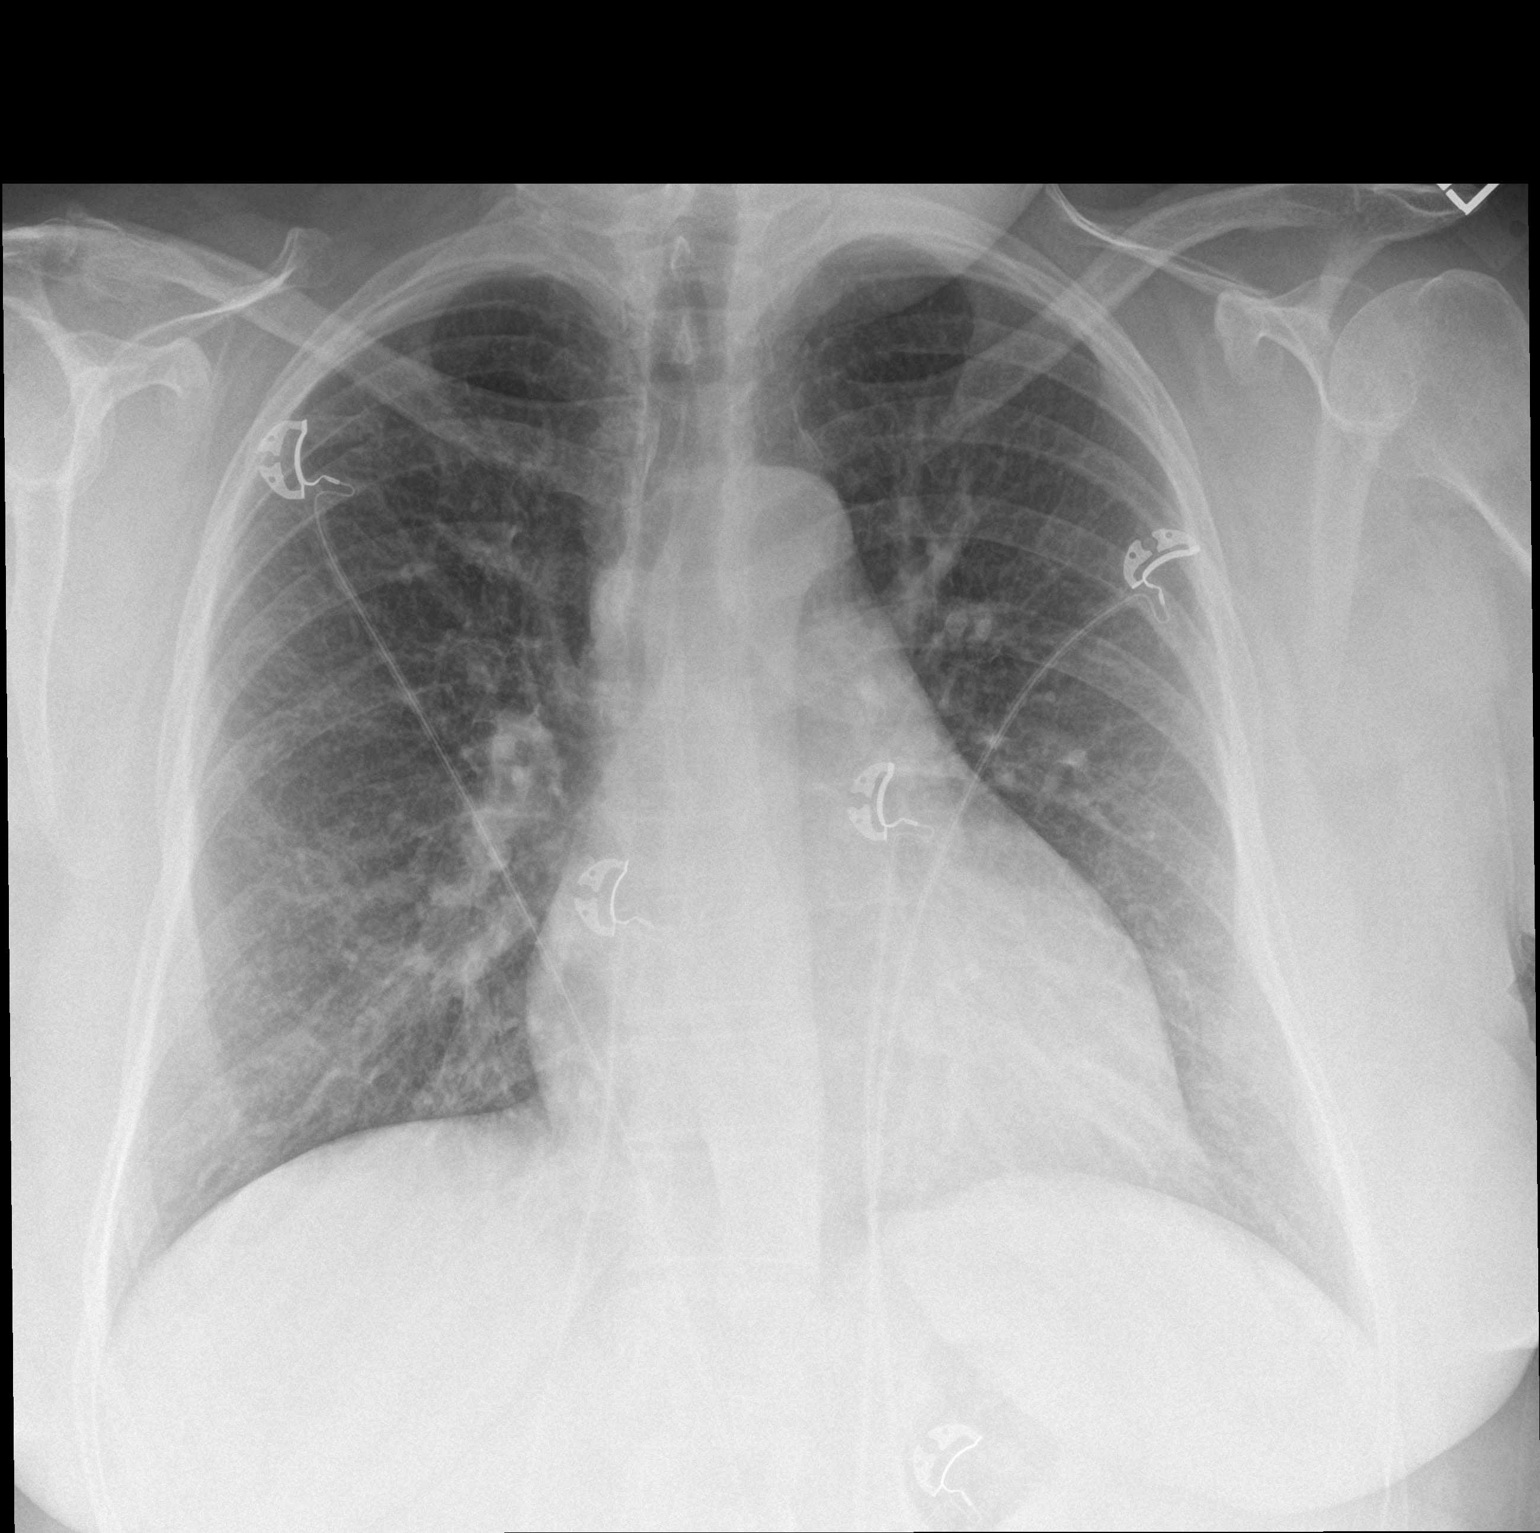

[chest lat]
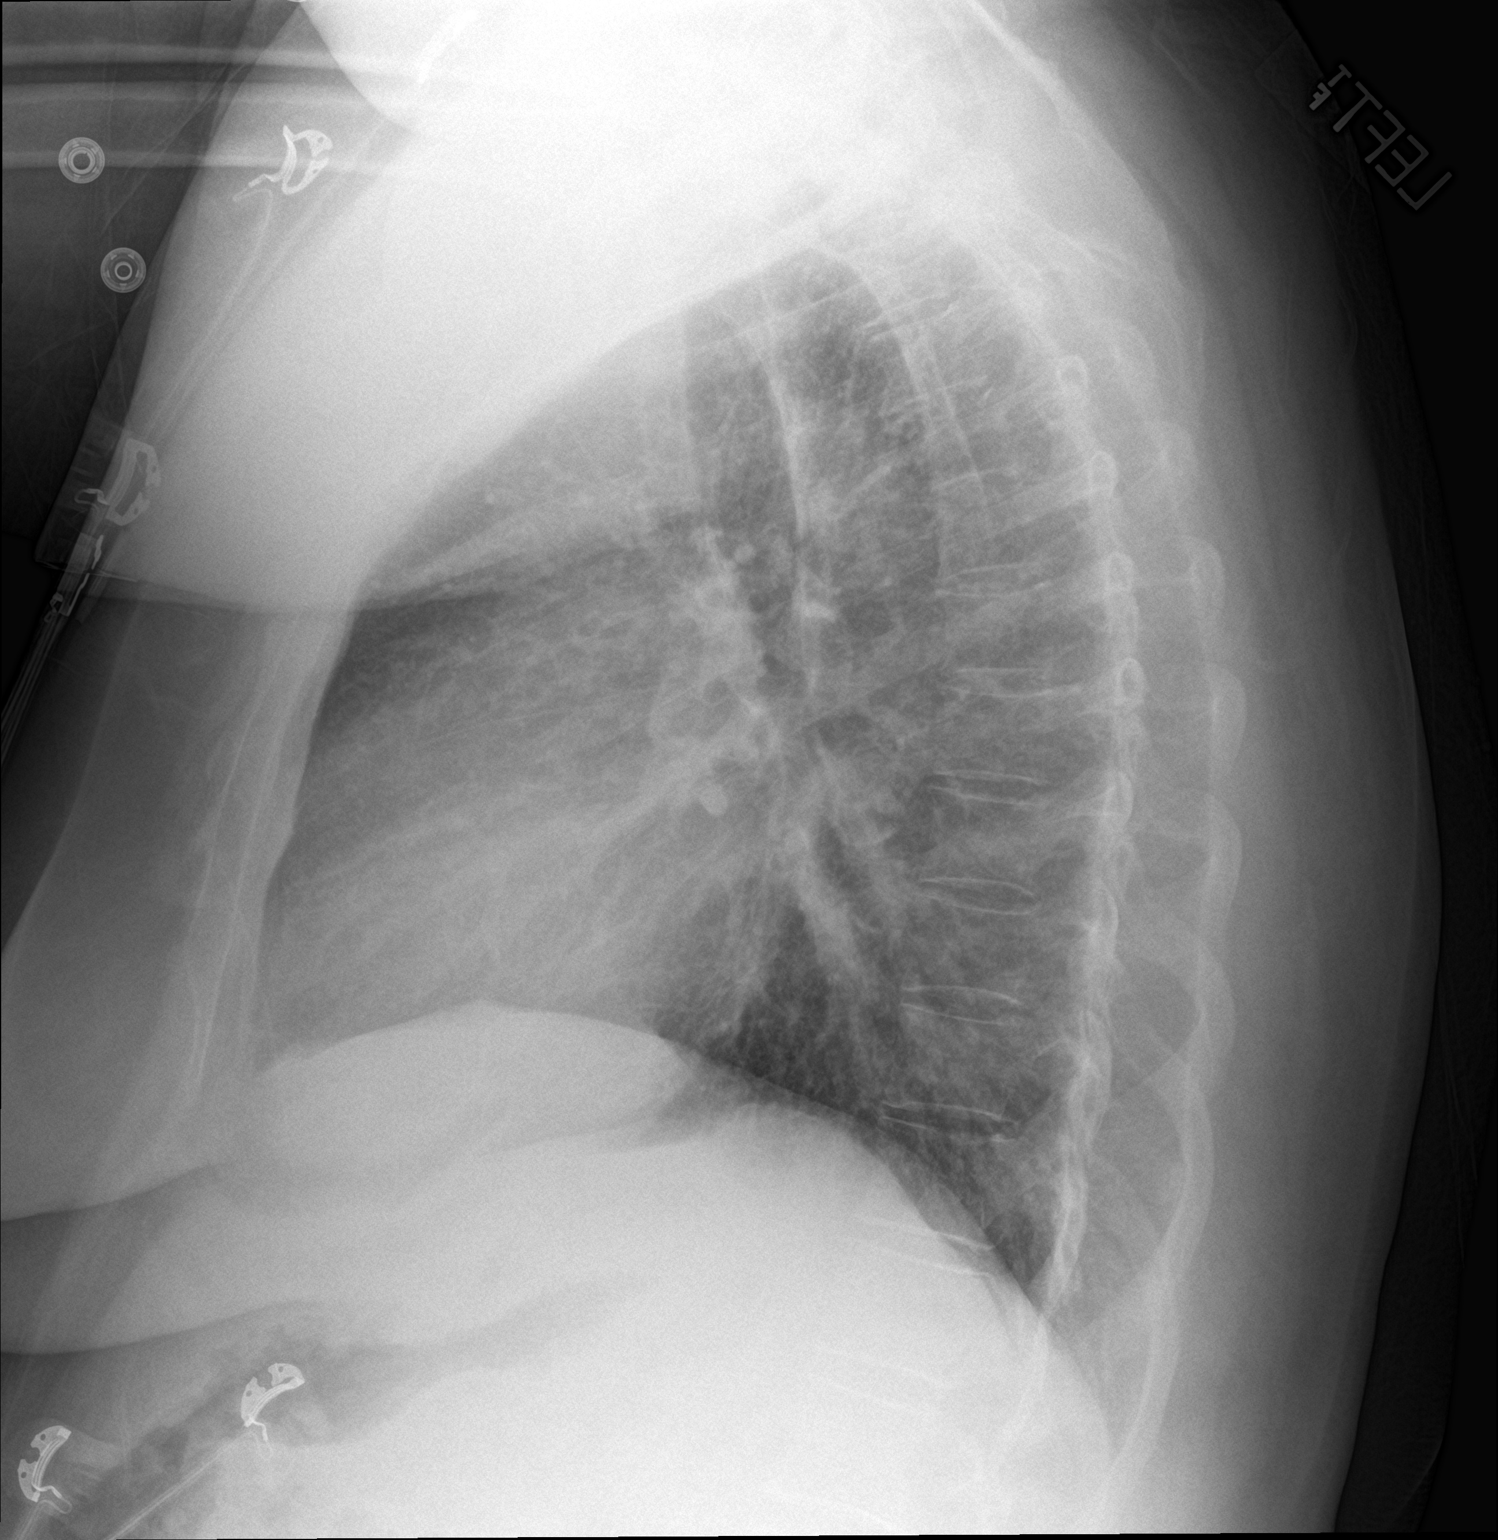

[2 of 2 positions shown; findings below may reference images not displayed]

FINDINGS: The heart size and mediastinal contours are within normal limits.
Both lungs are clear. The visualized skeletal structures are
unremarkable.
IMPRESSION: No active cardiopulmonary disease.

## 2017-03-28 ENCOUNTER — Ambulatory Visit (INDEPENDENT_AMBULATORY_CARE_PROVIDER_SITE_OTHER): Payer: Medicaid Other | Admitting: Internal Medicine

## 2017-03-28 ENCOUNTER — Encounter (INDEPENDENT_AMBULATORY_CARE_PROVIDER_SITE_OTHER): Payer: Self-pay | Admitting: Internal Medicine

## 2017-03-28 VITALS — BP 126/74 | HR 72 | Temp 98.4°F | Resp 18 | Ht 62.0 in | Wt 240.0 lb

## 2017-03-28 DIAGNOSIS — Z944 Liver transplant status: Secondary | ICD-10-CM

## 2017-03-28 DIAGNOSIS — K432 Incisional hernia without obstruction or gangrene: Secondary | ICD-10-CM

## 2017-03-28 NOTE — Progress Notes (Signed)
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 Patient had transplant in July 2016.  Subjective:  Cynthia Stephenson is 51 year old Caucasian female who is here for scheduled visit. 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 Her condition rapidly deteriorated and she was sent to [facility]. She underwent liver transplant on 06/24/2015. Since then she has been followed closely at [facility]. She wanted to be seen today to bring [place] up-to-date on her clinical course. She is getting blood work on schedule and immunosuppressants are being managed by [facility]. She developed incisional hernia about 3 months ago. She was seen by Dr. Meriam Sprague who opted not to do her surgery. She is scheduled to undergo surgery at [facility]. Denies abdominal pain. She also denies nausea vomiting and pruritus melena or rectal bleeding. She did have screening colonoscopy at [facility] She is trying to increase her physical activity but has been delayed because of hernia.     Current Medications: Outpatient Encounter Prescriptions as of 03/28/2017  Medication Sig  . acetaminophen (TYLENOL) 325 MG tablet Take by mouth. As needed  . amLODipine (NORVASC) 5 MG tablet Take 5 mg by mouth daily.   Marland Kitchen aspirin EC 81 MG tablet Take 81 mg by mouth daily.  . Cholecalciferol (VITAMIN D3) 2000 UNITS capsule Take 2,000 Units by mouth.  . escitalopram (LEXAPRO) 10 MG tablet 10 mg daily.  Marland Kitchen levETIRAcetam (KEPPRA) 250 MG tablet Take 1 tablet (250 mg total) by mouth 2 (two) times daily.  Marland Kitchen levothyroxine (SYNTHROID, LEVOTHROID) 112 MCG tablet TAKE ONE TABLET BY MOUTH ONCE DAILY  . LORazepam (ATIVAN) 0.5 MG tablet Take 0.5 mg by mouth 2 (two) times daily. Take up to two a day as needed  . magnesium oxide (MAG-OX) 400 MG tablet Take 400 mg by mouth daily.  . mycophenolate (MYFORTIC) 180 MG EC tablet Take 180 mg by mouth. Patient states that she takes  2 in the morning and 2 at night.  . pravastatin (PRAVACHOL) 10 MG tablet Take 10 mg by mouth daily.  . tacrolimus (PROGRAF) 1 MG capsule Take 5 mg by mouth. Patient states that she is taking 3 in the morning and 2 at night.  . [DISCONTINUED] fenofibrate 160 MG tablet Take by mouth.  . [DISCONTINUED] nystatin (MYCOSTATIN) 100000 UNIT/ML suspension Take 5 mLs by mouth daily. SWISH AND SPIT ONCE DAILY  . [DISCONTINUED] ondansetron (ZOFRAN) 4 MG tablet Take 1 tablet (4 mg total) by mouth every 8 (eight) hours as needed for nausea or vomiting. (Patient not taking: Reported on 03/28/2017)   No facility-administered encounter medications on file as of 03/28/2017.      Objective: Blood pressure 126/74, pulse 72, temperature 98.4 F (36.9 C), temperature source Oral, resp. rate 18, height  (1.575 m), weight 240 lb (108.9 kg). Patient is alert and in no acute distress. Conjunctiva is pink. Sclera is nonicteric Oropharyngeal mucosa is normal. No neck masses or thyromegaly noted. Cardiac exam with regular rhythm normal S1 and S2. No murmur or gallop noted. Lungs are clear to auscultation. Abdomen is full. She has a long scar over her abdomen with large incisional hernia is completely reducible. No organomegaly or masses.  No LE edema or clubbing noted.  Labs/studies Results: Lab data from 03/23/2017  WBC 5.9, H&H 12 and 36.9 and platelet count 204K.   BUN 20 and creatinine 1.10. Bilirubin 0.4, AP 100 AST 29 & ALT 57 Total protein  6.4 with albumin of 4.3. Serum calcium is 9.5 and magnesium 1.7.  Assessment:  #1. Status post liver transplant in July 2016 for cirrhosis secondary to NASH. ALT is mildly elevated and it may be due to fatty liver. She is being closely followed at [facility] and we will therefore follow her peripherally. #2. Large incisional hernia. She is to have herniorrhaphy at [facility] near future.   Plan:  Patient will continue with scheduled blood work as  ordered by a Sports administrator at [facility]. Patient must increase physical activity once she has fully recovered from hernia repair. Office visit in one year or on as-needed basis.

## 2017-03-28 NOTE — Patient Instructions (Signed)
Resume physical activity when cleared following repair of ventral/incisional hernia.

## 2017-05-30 ENCOUNTER — Encounter (HOSPITAL_BASED_OUTPATIENT_CLINIC_OR_DEPARTMENT_OTHER): Payer: Self-pay

## 2017-05-30 DIAGNOSIS — G4733 Obstructive sleep apnea (adult) (pediatric): Secondary | ICD-10-CM

## 2017-06-16 ENCOUNTER — Ambulatory Visit: Payer: Medicaid Other | Attending: Pulmonary Disease | Admitting: Neurology

## 2017-06-16 DIAGNOSIS — Z79899 Other long term (current) drug therapy: Secondary | ICD-10-CM | POA: Insufficient documentation

## 2017-06-16 DIAGNOSIS — Z7982 Long term (current) use of aspirin: Secondary | ICD-10-CM | POA: Diagnosis not present

## 2017-06-16 DIAGNOSIS — G4733 Obstructive sleep apnea (adult) (pediatric): Secondary | ICD-10-CM | POA: Insufficient documentation

## 2017-06-28 NOTE — Unmapped (Signed)
Specialty Pharmacy Refill Coordination Note     Adalay Azucena is a 51 y.o. female contacted today regarding refills of her specialty medication(s).    Reviewed and verified with patient:     [address]  [place] [address]     Specialty medication(s) and dose(s) confirmed: yes  Changes to medications: no  Changes to insurance: no    Medication Adherence    Patient reported X missed doses in the last month:  0  Specialty Medication:  MYFORTIC 180 MG  Patient is on additional specialty medications:  Yes  Additional Specialty Medications:  PROGRAF 1 MG  Medication Assistance Program  Refill Coordination  Has the Patient's Contact Information Changed:  No  Is the Shipping Address Different:  No  Shipping Information  Delivery Scheduled:  Yes  Delivery Date:  06/30/17  Medications to be Shipped:  MYFORTIC 180 MG  PROGRAF 1 MG          Myfortic 180 mg   Quantity filled last month: 120   # of tablets left on hand: 35  Prograf 1 mg   Quantity filled last month: 120   # of tablets left on hand: 35         Ardyth Man  Pension scheme manager

## 2017-06-29 MED ORDER — MYFORTIC 180 MG TABLET,DELAYED RELEASE
ORAL_TABLET | Freq: Two times a day (BID) | ORAL | 3 refills | 0 days | Status: CP
Start: 2017-06-29 — End: 2017-08-11

## 2017-06-29 MED FILL — PROGRAF/1MG/CAP: PROGRAF/1MG/CAP | 30 days supply | Qty: 120 | Fill #2

## 2017-06-29 MED FILL — MYFORTIC/180MG/TAB: MYFORTIC/180MG/TAB | 30 days supply | Qty: 120 | Fill #0

## 2017-06-29 NOTE — Unmapped (Signed)
FOLLOW UP ANNUAL LIVER CLINIC NOTE     Patient Name: Evelyn Rollins  Medical Record Number: [id number]  Date of Service: 06/29/2017    Referring Physician: Jacinto Halim [facility]*   Current complaint: Follow up Annual Liver    Assessment/Plan:     Callee Rohrig is a 51 y.o. 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  Routine studies today are stable. She has had mild elevation in ALT over the last few months but this remains stable and the only abnormal LFT. Will continue to monitor with monthly labs.     Encouraged her to continue to lose and maintain weight loss. She feels she is best suited for hernia repair after she loses a bit more weight. Congratulated her on her 15lb weight loss since evaluation with Dr. Carlynn Purl (BMI 45 to 41) and the hernia team and supported her continued weight loss efforts. She is followed by [facility] obesity medicine program.    Wt Readings from Last 3 Encounters:   07/03/17 (!) 102.6 kg (226 lb 1.6 oz)   05/31/17 (!) 104.6 kg (230 lb 9.6 oz)   05/17/17 (!) 106.6 kg (235 lb 1.6 oz)     She reported to myself and Selena Batten, Statistician that she recently lost her medicaid (due to expire in 1 week)and is investigating transition to [employer]. She reports calling the new Child psychotherapist who did not have any information to share with her and also discussed options with who she believed was Artist, without clairification. Will follow up with Selena Batten and social work Acupuncturist for options and education on pharmacy coverage. She has a 1 month supply she just filled; encouraged her that we can safely transition to generic if necessary and that this would require additional laboratory testing - she was agreeable to this plan.     HEALTH MAINTENANCE:   - Dermatology: will schedule - educated on skin safety post-translant  - Mammogram: due, will schedule  - Colonoscopy: Fecal occult completed recently, no need for re-scope  - Dental: will schedule   - Eye exam: due for exam - Bone density: n/a  - Mental health: no concerns   Immunization History   Administered Date(s) Administered   ??? Hepatitis B, Adult 05/01/2015   ??? Influenza Vaccine Quad (IIV4 PF) 45mo+ injectable 08/20/2015   ??? PPD Test 04/29/2015   ??? Pneumococcal Polysaccharide 23 04/29/2015     Received Shingrix #1 today, will receive #2 with PCP 08/03/17-01/03/18  Reports that she received her pneumonia vaccine locally, will retreive records to share with Selena Batten, RN coordinator.    Return to clinic: 1 year, sooner PRN  Labs: monthly    I spent a total of 25 minutes of which 50% was spent in counseling and coordination of care.    Subjective:     HPI: Evelyn Rollins is a 51 y.o. 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 She has had minimal changes to health since previous visit. Recent testing/images and immunosuppressive levels were within goal range (4-6).  Over the last year, she was hospitalized for fever at [facility], worked up for CMV, but negative and spontaneously cleared fever. She is functionally very well and active with her 7 grandchildren.     Denies fever, chills, arthralgias, weight loss/gain, and fatigue. Denies chest pain, SOB, N/V/D, constipation or abdominal pain. She denies acute complaint today..    Past Medical History:   Diagnosis Date   ???  Cirrhosis (CMS-HCC)    ??? Hypothyroidism    ??? NAFLD (nonalcoholic fatty liver disease)    ??? Obesity    ??? Seizure (CMS-HCC) Dec 2015       Past Surgical History:   Procedure Laterality Date   ??? CESAREAN SECTION  1987, 1989    2   ??? ESOPHAGOGASTRODUODENOSCOPY  recently   ??? HYSTERECTOMY  2012   ??? PR ERCP BALLOON DILATE BILIARY/PANC DUCT/AMPULLA EA N/A 09/02/2015    Procedure: ERCP;WITH TRANS-ENDOSCOPIC BALLOON DILATION OF BILIARY/PANCREATIC DUCT(S) OR OF AMPULLA, INCLUDING SPHINCTERECTOMY, WHEN PERFOREMD,EACH DUCT ([address]);  Surgeon: Malcolm Metro, MD;  Location: [facility];  Service: Gastroenterology   ??? PR ERCP REMOVE FOREIGN BODY/STENT BILIARY/PANC DUCT N/A 09/02/2015    Procedure: ENDOSCOPIC RETROGRADE CHOLANGIOPANCREATOGRAPHY (ERCP); W/ REMOVAL OF FOREIGN BODY/STENT FROM BILIARY/PANCREATIC DUCT(S);  Surgeon: Malcolm Metro, MD;  Location: [facility];  Service: Gastroenterology   ??? PR ERCP STENT PLACEMENT BILIARY/PANCREATIC DUCT N/A 07/07/2015    Procedure: ENDOSCOPIC RETROGRADE CHOLANGIOPANCREATOGRAPHY (ERCP); WITH PLACEMENT OF ENDOSCOPIC STENT INTO BILIARY OR PANCREATIC DUCT;  Surgeon: Malcolm Metro, MD;  Location: [facility];  Service: Gastroenterology   ??? PR ERCP,W/REMOVAL STONE,BIL/PANCR DUCTS N/A 09/02/2015    Procedure: ERCP; W/ENDOSCOPIC RETROGRADE REMOVAL OF CALCULUS/CALCULI FROM BILIARY &/OR PANCREATIC DUCTS;  Surgeon: Malcolm Metro, MD;  [facility]  Service: Gastroenterology   ??? PR TRANSPLANT LIVER,ALLOTRANSPLANT N/A 06/24/2015    Procedure: LIVER ALLOTRANSPLANTATION; ORTHOTOPIC, PARTIAL OR WHOLE, FROM CADAVER OR LIVING DONOR, ANY AGE;  Surgeon: Vivi Barrack, MD;  Location: MAIN OR [facility];  Service: Transplant   ??? PR TRANSPLANT,PREP DONOR LIVER, WHOLE N/A 06/24/2015    Procedure: [facility] STD PREP CAD DONOR WHOLE LIVER GFT PRIOR TNSPLNT,INC CHOLE,DISS/REM SURR TISSU WO TRISEG/LOBE SPLT;  Surgeon: Vivi Barrack, MD;  Location: MAIN OR [facility];  Service: Transplant       Family History   Problem Relation Age of Onset   ??? Breast cancer Mother    ??? Thyroid cancer Mother    ??? Ovarian cancer Mother    ??? Cancer Mother    ??? COPD Father    ??? Diabetes Father    ??? Melanoma Father    ??? Other Father         pacemaker       Social History     Social History   ??? Marital status: Married     Spouse name: N/A   ??? Number of children: N/A   ??? Years of education: N/A     Occupational History   ??? Not on file.     Social History Main Topics   ??? Smoking status: Never Smoker   ??? Smokeless tobacco: Never Used   ??? Alcohol use No   ??? Drug use: No   ??? Sexual activity: Not on file     Other Topics Concern   ??? Not on file     Social History Narrative    Living situation: the patient lives with her husband.    Address [place], [place], [place]): [place], [place], [place]    Guardian/Payee: None        Family Contact: none provided    Outpatient Providers: no psychiatric providers    Relationship Status: Married     Children: Yes; two children and one stepchild    Education: HS graduate    Income/Employment/Disability: Doesn't currently work. 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 Informant: the patient     Domestic Violence: None known. Informant: the patient     Exposure/Witness to Violence: None known    Protective Services Involvement: None known    Current/Prior Legal: None known    Physical Aggression/Violence: None known    Access to Firearms: Yes, Unsecured     Gang Involvement: None known        Psychiatric History:    Diagnoses:    None        Inpatient hospitalizations:    None        Outpatient treatment:    None        Medication trials:    None per patient. Per medical chart, she received Valium for a brief period of time for some anxiety.        Suicide attempts/Self-injury:    None        Substance Use:    None        Family Psychiatric History:    None                       REVIEW OF SYSTEMS:   The balance of 10/12 systems is negative with the exception of HPI.    Objective:     MEDICATIONS:  No Known Allergies    Current Outpatient Prescriptions   Medication Sig Dispense Refill   ??? amLODIPine (NORVASC) 5 MG tablet Take 5 mg by mouth daily.     ??? aspirin (ECOTRIN) 81 MG tablet Take 1 tablet (81 mg total) by mouth daily. 30 tablet 11   ??? cholecalciferol, vitamin D3, (VITAMIN D3) 2,000 unit cap Take 1 capsule by mouth daily at 0600.     ??? escitalopram oxalate (LEXAPRO) 10 MG tablet      ??? levETIRAcetam (KEPPRA) 250 MG tablet Take 1 tablet (250 mg total) by mouth Two (2) times a day. 120 tablet 0   ??? levothyroxine (SYNTHROID, LEVOTHROID) 112 MCG tablet TAKE ONE TABLET BY MOUTH ONCE DAILY     ??? levothyroxine (SYNTHROID, LEVOTHROID) 125 MCG tablet Take 1 tablet (125 mcg total) by mouth daily at 0600. 30 tablet 3   ??? LORazepam (ATIVAN) 0.5 MG tablet Take 1 mg by mouth two (2) times a day as needed for anxiety.      ??? magnesium oxide (MAG-OX) 400 mg tablet Take 400 mg by mouth.     ??? MYFORTIC 180 mg EC tablet Take 2 tablets (360 mg total) by mouth Two (2) times a day. 360 tablet 3   ??? pravastatin (PRAVACHOL) 40 MG tablet Take 40 mg by mouth daily. PCP started and managing     ??? PROGRAF 1 mg capsule Take 2 capsules (2 mg total) by mouth Two (2) times a day. 360 capsule 3   ??? triamcinolone (KENALOG) 0.5 % ointment Apply topically.       No current facility-administered medications for this visit.        PHYSICAL EXAM:  BP 138/90 (BP Site: L Arm, BP Position: Sitting, BP Cuff Size: Medium)  - Pulse 66  - Temp 37.1 ??C (98.8 ??F) (Tympanic)  - Ht 157.5 cm (5' 2)  - Wt (!) 102.6 kg (226 lb 1.6 oz)  - BMI 41.35 kg/m??     General Appearance:  NAD, well appearing and well nourished.   HEENT:  [place]/AT. Well hydrated moist mucous membranes of the oral cavity. No scleral icterus. Neck supple; no  JVD. No cervical lymphadenopathy.   Pulmonary:    Normal respiratory effort. CTAB, without wheezes/crackles/rhonchi. Good air movement.    Cardiovascular:  Regular rate and rhythm, no murmur noted.   Extremities No edema. No rash, lesions or petiche.   Abdomen:   Normoactive bowel sounds, abdomen soft, non-tender and not distended, no Hepatosplenomegaly or masses. Abdominal scar well healed with reducible, ventral incisional hernia.    Musculoskeletal: No joint tenderness, full ROM. Normal gait.    Skin: Skin color, texture, turgor normal, no rashes or lesions.   Neurologic: Alert and oriented to person, place, and time. No motor abnormalities noted.  Sensation grossly intact.   Psychiatric: Judgement and insight appropriate. LAB RESULTS:  All lab results last 24 hours:    Recent Results (from the past 48 hour(s))   Comprehensive Metabolic Panel    Collection Time: 07/03/17  9:01 AM   Result Value Ref Range    Sodium 142 135 - 145 mmol/L    Potassium 4.6 3.5 - 5.0 mmol/L    Chloride 107 98 - 107 mmol/L    CO2 25.0 22.0 - 30.0 mmol/L    BUN 24 (H) 7 - 21 mg/dL    Creatinine 1.61 (H) 0.60 - 1.00 mg/dL    BUN/Creatinine Ratio 23     EGFR MDRD Non Af Amer 56 (L) >=60 mL/min/1.14m2    EGFR MDRD Af Amer >=60 >=60 mL/min/1.2m2    Anion Gap 10 9 - 15 mmol/L    Glucose 102 (H) 65 - 99 mg/dL    Calcium 9.4 8.5 - 09.6 mg/dL    Albumin 4.2 3.5 - 5.0 g/dL    Total Protein 6.9 6.5 - 8.3 g/dL    Total Bilirubin 0.5 0.0 - 1.2 mg/dL    AST 35 14 - 38 U/L    ALT 75 (H) 15 - 48 U/L    Alkaline Phosphatase 78 38 - 126 U/L   Bilirubin, Direct    Collection Time: 07/03/17  9:01 AM   Result Value Ref Range    Bilirubin, Direct 0.20 0.00 - 0.40 mg/dL   Phosphorus Level    Collection Time: 07/03/17  9:01 AM   Result Value Ref Range    Phosphorus 4.0 2.9 - 4.7 mg/dL   Magnesium Level    Collection Time: 07/03/17  9:01 AM   Result Value Ref Range    Magnesium 1.9 1.6 - 2.2 mg/dL   Gamma GT    Collection Time: 07/03/17  9:01 AM   Result Value Ref Range    GGT 18 11 - 48 U/L   Tacrolimus Level, Trough    Collection Time: 07/03/17  9:01 AM   Result Value Ref Range    Tacrolimus, Trough 4.9 <=20.0 ng/mL   CBC w/ Differential    Collection Time: 07/03/17  9:01 AM   Result Value Ref Range    WBC 6.7 4.5 - 11.0 10*9/L    RBC 4.53 4.00 - 5.20 10*12/L    HGB 12.3 12.0 - 16.0 g/dL    HCT 04.5 40.9 - 81.1 %    MCV 86.5 80.0 - 100.0 fL    MCH 27.0 26.0 - 34.0 pg    MCHC 31.3 31.0 - 37.0 g/dL    RDW 91.4 78.2 - 95.6 %    MPV 7.2 7.0 - 10.0 fL    Platelet 255 150 - 440 10*9/L    Absolute Neutrophils 3.4 2.0 - 7.5 10*9/L    Absolute Lymphocytes 2.6 1.5 - 5.0 10*9/L  Absolute Monocytes 0.4 0.2 - 0.8 10*9/L    Absolute Eosinophils 0.2 0.0 - 0.4 10*9/L    Absolute Basophils 0.0 0.0 - 0.1 10*9/L    Large Unstained Cells 2 0 - 4 %    Hypochromasia Slight (A) Not Present     IMAGING:  Chest X Ray 07/03/2017  Radiographically clear lungs.  No pleural effusion or pneumothorax.  Unremarkable cardiomediastinal silhouette.   Sequelae of liver transplant with surgical clips in the right upper quadrant.    Liver US 07/03/2017  -- Patent hepatic transplant vasculature with appropriate flow direction. Improved velocity in the main portal vein preanastomosis, now within normal limits.  -- Similar resistive indices in the hepatic transplant arteries, within normal limits.  -- Hepatic steatosis.  _______________________________________________        Ermalinda Barrios, DNP, APRN, FNP-C  [facility]  [address]  [place], [place]  [address]

## 2017-06-29 NOTE — Unmapped (Signed)
TFC received e-mail from Justice Britain (Child psychotherapist) who stated patient had questions about medication now that she will have Medicare. TFC called patient but no answer, TFC left voicemail for patient.

## 2017-06-29 NOTE — Unmapped (Signed)
UPDATING REFILL DATE

## 2017-06-30 NOTE — Unmapped (Signed)
Patient request for RX refill.

## 2017-07-01 NOTE — Procedures (Signed)
[facility] Jauna Raczynski A. Gerilyn Pilgrim, MD     [email or web address]             NOCTURNAL POLYSOMNOGRAPHY   LOCATION: [facility]  Patient Name: Cynthia Stephenson, Cynthia Stephenson Date: 06/16/2017 Gender: Female D.O.B: February 26, 1966 Age (years): 51 Referring Provider: Mikey Bussing Height (inches): 62 Interpreting Physician: Beryle Beams MD, ABSM Weight (lbs): 226 RPSGT: Peak, Robert BMI: 41 MRN: [id number] Neck Size: 17.00 CLINICAL INFORMATION Sleep Study Type: NPSG  Indication for sleep study: OSA  Epworth Sleepiness Score:  SLEEP STUDY TECHNIQUE As per the AASM Manual for the Scoring of Sleep and Associated Events v2.3 (April 2016) with a hypopnea requiring 4% desaturations.  The channels recorded and monitored were frontal, central and occipital EEG, electrooculogram (EOG), submentalis EMG (chin), nasal and oral airflow, thoracic and abdominal wall motion, anterior tibialis EMG, snore microphone, electrocardiogram, and pulse oximetry.  MEDICATIONS Medications self-administered by patient taken the night of the study : N/A  Current Outpatient Prescriptions:  .  acetaminophen (TYLENOL) 325 MG tablet, Take by mouth. As needed, Disp: , Rfl:  .  amLODipine (NORVASC) 5 MG tablet, Take 5 mg by mouth daily. , Disp: , Rfl:  .  aspirin EC 81 MG tablet, Take 81 mg by mouth daily., Disp: , Rfl:  .  Cholecalciferol (VITAMIN D3) 2000 UNITS capsule, Take 2,000 Units by mouth., Disp: , Rfl:  .  levETIRAcetam (KEPPRA) 250 MG tablet, Take 1 tablet (250 mg total) by mouth 2 (two) times daily., Disp: 180 tablet, Rfl: 4 .  levothyroxine (SYNTHROID, LEVOTHROID) 112 MCG tablet, TAKE ONE TABLET BY MOUTH ONCE DAILY, Disp: , Rfl:  .  LORazepam (ATIVAN) 0.5 MG tablet, Take 0.5 mg by mouth 2 (two) times daily. Take up to two a day as needed, Disp: , Rfl:  .  magnesium oxide (MAG-OX) 400 MG tablet, Take 400 mg by mouth daily., Disp: , Rfl:  .  mycophenolate (MYFORTIC) 180 MG EC tablet, Take 180 mg by mouth.  Patient states that she takes 2 in the morning and 2 at night., Disp: , Rfl:  .  pravastatin (PRAVACHOL) 10 MG tablet, Take 10 mg by mouth daily., Disp: , Rfl:  .  tacrolimus (PROGRAF) 1 MG capsule, Take 5 mg by mouth. Patient states that she is taking 3 in the morning and 2 at night., Disp: , Rfl:    SLEEP ARCHITECTURE The study was initiated at 10:30:57 PM and ended at 4:48:04 AM.  Sleep onset time was 11.1 minutes and the sleep efficiency was 59.1%. The total sleep time was 223.0 minutes.  Stage REM latency was 224.0 minutes.  The patient spent 2.02% of the night in stage N1 sleep, 62.78% in stage N2 sleep, 28.48% in stage N3 and 6.73% in REM.  Alpha intrusion was absent.  Supine sleep was 20.63%.  RESPIRATORY PARAMETERS The overall apnea/hypopnea index (AHI) was 4.0 per hour. There were 6 total apneas, including 6 obstructive, 0 central and 0 mixed apneas. There were 9 hypopneas and 6 RERAs.  The AHI during Stage REM sleep was 48.0 per hour.  AHI while supine was 18.3 per hour.  The mean oxygen saturation was 92.71%. The minimum SpO2 during sleep was 83.00%.  Soft snoring was noted during this study.  CARDIAC DATA The 2 lead EKG demonstrated sinus rhythm. The mean heart rate was 66.02 beats per minute. Other EKG findings include: None. LEG MOVEMENT DATA The total PLMS were 0 with a resulting PLMS index of 0.00. Associated arousal with leg movement index was 0.0.  IMPRESSIONS - No significant obstructive sleep apnea occurred during this study. - No significant central sleep apnea occurred during this study. - Clinically significant periodic limb movements did not occur during sleep. No significant associated arousals.   Argie RammingKofi A Modene Andy, MD Diplomate, American Board of Sleep Medicine.  ELECTRONICALLY SIGNED ON:  07/01/2017, 11:47 AM [facility] PH: [phone]   FX: [phone] ACCREDITED BY THE AMERICAN ACADEMY OF SLEEP MEDICINE

## 2017-07-03 ENCOUNTER — Ambulatory Visit
Admission: RE | Admit: 2017-07-03 | Discharge: 2017-07-03 | Disposition: A | Discharge status: Home / Self Care | Payer: MEDICAID

## 2017-07-03 ENCOUNTER — Ambulatory Visit
Admission: RE | Admit: 2017-07-03 | Discharge: 2017-07-03 | Disposition: A | Discharge status: Home / Self Care | Payer: MEDICAID | Attending: Obesity Medicine | Admitting: Obesity Medicine

## 2017-07-03 ENCOUNTER — Ambulatory Visit
Admission: RE | Admit: 2017-07-03 | Discharge: 2017-07-03 | Disposition: A | Discharge status: Home / Self Care | Attending: Surgery | Admitting: Surgery

## 2017-07-03 ENCOUNTER — Ambulatory Visit: Admission: RE | Admit: 2017-07-03 | Discharge: 2017-07-03 | Disposition: A | Discharge status: Home / Self Care

## 2017-07-03 DIAGNOSIS — Z944 Liver transplant status: Secondary | ICD-10-CM

## 2017-07-03 DIAGNOSIS — Z23 Encounter for immunization: Secondary | ICD-10-CM

## 2017-07-03 DIAGNOSIS — Z6841 Body Mass Index (BMI) 40.0 and over, adult: Secondary | ICD-10-CM

## 2017-07-03 DIAGNOSIS — Z79899 Other long term (current) drug therapy: Secondary | ICD-10-CM

## 2017-07-03 DIAGNOSIS — D899 Disorder involving the immune mechanism, unspecified: Secondary | ICD-10-CM

## 2017-07-03 DIAGNOSIS — K439 Ventral hernia without obstruction or gangrene: Principal | ICD-10-CM

## 2017-07-03 LAB — PHOSPHORUS: Phosphate:MCnc:Pt:Ser/Plas:Qn:: 4

## 2017-07-03 LAB — CBC W/ AUTO DIFF
BASOPHILS ABSOLUTE COUNT: 0 10*9/L (ref 0.0–0.1)
EOSINOPHILS ABSOLUTE COUNT: 0.2 10*9/L (ref 0.0–0.4)
HEMATOCRIT: 39.2 % (ref 36.0–46.0)
HEMOGLOBIN: 12.3 g/dL (ref 12.0–16.0)
LARGE UNSTAINED CELLS: 2 % (ref 0–4)
LYMPHOCYTES ABSOLUTE COUNT: 2.6 10*9/L (ref 1.5–5.0)
MEAN CORPUSCULAR HEMOGLOBIN CONC: 31.3 g/dL (ref 31.0–37.0)
MEAN CORPUSCULAR HEMOGLOBIN: 27 pg (ref 26.0–34.0)
MEAN CORPUSCULAR VOLUME: 86.5 fL (ref 80.0–100.0)
MEAN PLATELET VOLUME: 7.2 fL (ref 7.0–10.0)
MONOCYTES ABSOLUTE COUNT: 0.4 10*9/L (ref 0.2–0.8)
NEUTROPHILS ABSOLUTE COUNT: 3.4 10*9/L (ref 2.0–7.5)
RED BLOOD CELL COUNT: 4.53 10*12/L (ref 4.00–5.20)
RED CELL DISTRIBUTION WIDTH: 14 % (ref 12.0–15.0)

## 2017-07-03 LAB — GAMMA GLUTAMYL TRANSFERASE: Gamma glutamyl transferase:CCnc:Pt:Ser/Plas:Qn:: 18

## 2017-07-03 LAB — COMPREHENSIVE METABOLIC PANEL
ALBUMIN: 4.2 g/dL (ref 3.5–5.0)
ALKALINE PHOSPHATASE: 78 U/L (ref 38–126)
ALT (SGPT): 75 U/L — ABNORMAL HIGH (ref 15–48)
ANION GAP: 10 mmol/L (ref 9–15)
AST (SGOT): 35 U/L (ref 14–38)
BLOOD UREA NITROGEN: 24 mg/dL — ABNORMAL HIGH (ref 7–21)
BUN / CREAT RATIO: 23
CALCIUM: 9.4 mg/dL (ref 8.5–10.2)
CHLORIDE: 107 mmol/L (ref 98–107)
CO2: 25 mmol/L (ref 22.0–30.0)
CREATININE: 1.03 mg/dL — ABNORMAL HIGH (ref 0.60–1.00)
EGFR MDRD NON AF AMER: 56 mL/min/{1.73_m2} — ABNORMAL LOW (ref >=60)
GLUCOSE RANDOM: 102 mg/dL — ABNORMAL HIGH (ref 65–99)
POTASSIUM: 4.6 mmol/L (ref 3.5–5.0)
PROTEIN TOTAL: 6.9 g/dL (ref 6.5–8.3)
SODIUM: 142 mmol/L (ref 135–145)

## 2017-07-03 LAB — BASOPHILS ABSOLUTE COUNT: Lab: 0

## 2017-07-03 LAB — GAMMA GT: GAMMA GLUTAMYL TRANSFERASE: 18 U/L (ref 11–48)

## 2017-07-03 LAB — PROTEIN TOTAL: Protein:MCnc:Pt:Ser/Plas:Qn:: 6.9

## 2017-07-03 LAB — TACROLIMUS, TROUGH: Lab: 4.9

## 2017-07-03 LAB — BILIRUBIN DIRECT: Bilirubin.glucuronidated:MCnc:Pt:Ser/Plas:Qn:: 0.2

## 2017-07-03 LAB — MAGNESIUM: Magnesium:MCnc:Pt:Ser/Plas:Qn:: 1.9

## 2017-07-03 NOTE — Unmapped (Signed)
Summary at initial presentation  Evelyn Rollins is a 51 y.o. female and Body mass index is 44.74 kg/m??..  She presents to the [facility] interested in lifestyle , pharmacologic and surgical treatments for her obesity with her motivation for weight loss being improvement in health of her transplanted liver as well as to be able to undergo abdominal reconstruction surgery for which she needs 20 pound weight loss..The causes of her obesity are multifactorial and include strong genetic predisposition , metabolic effects of consumption of processed food, irregular eating patterns causing circadian disruption, inadequate sleep duration, poor sleep quality, increased life stress and weight gain promoting medication Lexapro .  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 She has no disordered eating patterns which may be barriers to a good treatment response. Clearly her overall quality of life is compromised by her weight and she is motivated to be a part of our medical weight program.    Treatment course  Starting weight at presentation:236.6 lb  Weight today: 227 lbs  Total weight loss since presentation:10 lbs  Percent weight loss since presentation:4%  Percent weight loss with low carb diet : 4 %        Previous use of anti-obesity medications:  None    Narrative history  ????No c/o   ??  Diet: Has been following the low carb diet. Not much processed foods  ??  ??Exercise:No regular exercise  ????  Sleep: Had a sleep study , results not available. Getting 5-6 hrs sleep and waking up twice a night.  ????  Stress: 5/10  ??????  Medications: No medication changes.    Eating disorders:    Patient Active Problem List    Diagnosis Date Noted   ??? 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 Bile leak, postoperative 07/06/2015   ??? Liver transplanted (CMS-HCC) 07/05/2015   ??? Hyperkalemia 06/17/2015   ??? Acute kidney injury (CMS-HCC) 06/17/2015   ??? Hyponatremia 06/16/2015   ??? Fatigue 06/16/2015   ??? Anxiety 06/16/2015   ??? Shortness of breath 03/20/2015   ??? Thrombocytopenia (CMS-HCC) 03/20/2015   ??? Ascites 03/19/2015   ??? Depression 02/10/2015       Current Outpatient Prescriptions on File Prior to Visit   Medication Sig Dispense Refill   ??? acetaminophen (TYLENOL) 325 MG tablet Take 650 mg by mouth daily as needed.     ??? amLODIPine (NORVASC) 5 MG tablet Take 5 mg by mouth daily.     ??? aspirin (ECOTRIN) 81 MG tablet Take 1 tablet (81 mg total) by mouth daily. 30 tablet 11   ??? cholecalciferol, vitamin D3, (VITAMIN D3) 2,000 unit cap Take 1 capsule by mouth daily at 0600.     ??? escitalopram oxalate (LEXAPRO) 10 MG tablet Take 10 mg by mouth daily.      ??? levETIRAcetam (KEPPRA) 250 MG tablet Take 1 tablet (250 mg total) by mouth Two (2) times a day. 120 tablet 0   ??? levothyroxine (SYNTHROID, LEVOTHROID) 112 MCG tablet TAKE ONE TABLET BY MOUTH ONCE DAILY     ??? levothyroxine (SYNTHROID, LEVOTHROID) 125 MCG tablet Take 1 tablet (125 mcg total) by mouth daily at 0600. 30 tablet 3   ??? LORazepam (ATIVAN) 0.5 MG tablet Take 1 mg by mouth two (2) times a day as needed for anxiety.      ??? magnesium oxide (MAG-OX) 400 mg  tablet Take 400 mg by mouth.     ??? MYFORTIC 180 mg EC tablet Take 2 tablets (360 mg total) by mouth Two (2) times a day. 360 tablet 3   ??? pravastatin (PRAVACHOL) 40 MG tablet Take 40 mg by mouth daily. PCP started and managing     ??? PROGRAF 1 mg capsule Take 2 capsules (2 mg total) by mouth Two (2) times a day. 360 capsule 3   ??? triamcinolone (KENALOG) 0.5 % ointment Apply 1 application topically daily as needed.        No current facility-administered medications on file prior to visit.        Blood pressure 148/85, pulse 84, height 157.5 cm (5' 2.01), weight (!) 103 kg (227 lb), not currently breastfeeding.  Body mass index is 41.51 kg/m??.      Exam:  AAO x3  Chest: clear to auscultation  Abdomen: NAD  Ext: No pedal edema      Plan on 05/01/2017  Based on the possible etiologies for the patients obesity , my recommendations include the following :  ????????Diet quality and patterning: Based on her 24-hour dietary recall, patient tends to eat processed foods frequently and has a high intake of carbohydrates including processed carbohydrates. Based on increasing liver enzymes and a possibility of recurrence of fatty liver disease, she may benefit from low carbohydrate diet. I have discussed the principles of the diet, but she will return to follow-up with the medical weight program dietitian for details.  ??  ????????Exercise:????I reviewed the importance of exercise for ongoing weight loss and long term weight maintenance with the patient.??  ??      Sleep: Patient has poor sleep hygiene, says that she has difficulty falling asleep even though she is in bed and drinks 16 ounces of water before going to bed which makes her wake up 3 times at night to use the bathroom. We talked about reducing fluid intake 2 hours before bedtime. I also directed her to the website to help improve her sleep hygiene. She will start melatonin 5 mg at night 1 hour before bedtime regularly. Patient has a STOP-BA NG score of 6 out of 8. She will discuss with her primary care physician regarding getting a sleep study closer to home.  ??  ???? Stress management: We talked about strategies for stress management including meditation, yoga, prayer and going to church.  ????   ???? Weight gain causing medications: Patient is on Lexapro which could contribute to her weight gain. Other options such as fluoxetine and sertraline may be better from the weight gain perspective.  ??  ???? Anti Obesity Medications: At this time I would avoid using any anti-obesity medications as her liver enzymes are increasing. After we are able to stabilize the liver enzymes, and a trial of lifestyle changes including a low carbohydrate diet, we would consider adding anti-obesity medications. Anti-obesity medications that may be appropriate for patient include metformin, topiramate. I would not use Wellbutrin or phentermine due to history of seizures. Liraglutide may be tried but it is unlikely that her insurance would cover the prescription.  ??  ??  Obesity Surgery: Obesity surgery is an appropriate choice for the patient due to her multiple comorbidities and a BMI greater than 44. She is however unwilling to consider surgery at this time but willing to revisit the idea if medical therapies do not work.    Plan on 07/03/2017  Has been following the low carb diet. Doing well with consistent  weight loss.  Continue with low carb diet and follow up with me in 2 months.    Counselling  Total visit duration 25 min. >50% of the visit was used to counsel patient.

## 2017-07-03 NOTE — Unmapped (Signed)
Pt ID verified with Name and Date of birth. All screening questions answered.   Vaccine(s) administered as ordered. See Immunization history for documentation. Pt tolerated the injection(s) well with no issues noted. Vaccine Information Sheet given to patient.

## 2017-07-04 NOTE — Unmapped (Signed)
Discussed patient with Ermalinda Barrios, NP who had reviewed pt's annual scans specifically liver US; Amil Amen mentioned it continues to show fatty liver and for her to continue to eat healthy and exercise like she is currently doing as patient reported losing 15lbs since May. Amil Amen fine with patient getting labs monthly.     Also had left a VM with txp social worker while patient was in clinic. Received email back with resources for patient from social worker in regards to supplemental medicare insurance since patient is converting from [place] to Medicare.     Talked with patient letting her know that Amil Amen reviewed her liver US that continues to show fatty liver and to continue with eating healthy and exercising for improvement; to do labs monthly.   Mentioned hearing back from [place] with txp social work and gave her this information since patient does not have computer:   call [facility] (main office in [place]) for written material and verbal information about [employer], [phone].    Also: to utilize the [facility] volunteer at her local county Department on Aging:   [facility],  [address], [place], [place] [address], [phone]    contact:  June Carlson  Patient verbalized understanding.

## 2017-07-07 NOTE — Unmapped (Signed)
Patient seen in clinic for annual with Ermalinda Barrios, NP. Pt's husband accompanied patient in clinic. Denies N/V/D/Fever. Lost 15lbs since May. Took tacrolimus at 10pm last night.   Per Amil Amen, patient can do monthly labs if liver US is stable.     Patient mentioned she was being transitioned from [facility] to Medicare and needs to know what supplemental plan to choose to cover her medications. She mentioned talking with social worker who put her in contact with financial staff who mentioned they could let her know what her coverage is once a plan is chosen. While with patient, left VM with txp Child psychotherapist. Patient aware we will call her with the resources the txp social worker passes on once we hear back. Had liver US and CXR done.   Gave a copy of the [facility] order to her to have in case the [facility] cannot find the order in the computer.   Amil Amen ordered shingrix for her to receive today.   Patient has f/u with Dr. Carlynn Purl related to hernia repair next month.

## 2017-07-07 NOTE — Unmapped (Signed)
Pt's annual 07/03/17 liver US and CXR were reviewed by Dr. Celine Mans as noted in [facility] on 07/03/17; no interventions needed.     Amil Amen had discussed that pt's liver US mentioned fatty liver, which has already been discussed with patient (refer to 07/04/17 telephone encounter); Amil Amen was fine with patient getting monthly labs.   Amil Amen noted reviewing 07/03/17 CXR and liver US in her clinic note.

## 2017-07-18 ENCOUNTER — Ambulatory Visit: Payer: Medicaid Other | Admitting: Diagnostic Neuroimaging

## 2017-07-19 ENCOUNTER — Encounter: Payer: Self-pay | Admitting: Diagnostic Neuroimaging

## 2017-07-25 LAB — CBC W/ DIFFERENTIAL
BASOPHILS ABSOLUTE COUNT: 0 10*3/uL (ref 0.0–0.2)
BASOPHILS RELATIVE PERCENT: 1 %
EOSINOPHILS ABSOLUTE COUNT: 0.2 10*3/uL (ref 0.0–0.4)
EOSINOPHILS RELATIVE PERCENT: 3 %
HEMATOCRIT: 40.8 % (ref 34.0–46.6)
HEMOGLOBIN: 12.8 g/dL (ref 11.1–15.9)
IMMATURE GRANULOCYTES: 0 %
LYMPHOCYTES ABSOLUTE COUNT: 2.4 10*3/uL (ref 0.7–3.1)
LYMPHOCYTES RELATIVE PERCENT: 41 %
MEAN CORPUSCULAR HEMOGLOBIN CONC: 31.4 g/dL — ABNORMAL LOW (ref 31.5–35.7)
MEAN CORPUSCULAR HEMOGLOBIN: 27.6 pg (ref 26.6–33.0)
MEAN CORPUSCULAR VOLUME: 88 fL (ref 79–97)
MONOCYTES ABSOLUTE COUNT: 0.3 10*3/uL (ref 0.1–0.9)
MONOCYTES RELATIVE PERCENT: 5 %
NEUTROPHILS ABSOLUTE COUNT: 3 10*3/uL (ref 1.4–7.0)
NEUTROPHILS RELATIVE PERCENT: 50 %
RED BLOOD CELL COUNT: 4.64 x10E6/uL (ref 3.77–5.28)
RED CELL DISTRIBUTION WIDTH: 14 % (ref 12.3–15.4)

## 2017-07-25 LAB — COMPREHENSIVE METABOLIC PANEL
A/G RATIO: 2 (ref 1.2–2.2)
ALBUMIN: 4.5 g/dL (ref 3.5–5.5)
ALKALINE PHOSPHATASE: 83 IU/L (ref 39–117)
ALT (SGPT): 55 IU/L — ABNORMAL HIGH (ref 0–32)
BILIRUBIN TOTAL: 0.4 mg/dL (ref 0.0–1.2)
BLOOD UREA NITROGEN: 23 mg/dL (ref 6–24)
BUN / CREAT RATIO: 22 (ref 9–23)
CALCIUM: 9.6 mg/dL (ref 8.7–10.2)
CHLORIDE: 108 mmol/L — ABNORMAL HIGH (ref 96–106)
CREATININE: 1.06 mg/dL — ABNORMAL HIGH (ref 0.57–1.00)
GLOBULIN, TOTAL: 2.3 g/dL (ref 1.5–4.5)
GLUCOSE: 102 mg/dL — ABNORMAL HIGH (ref 65–99)
POTASSIUM: 4.9 mmol/L (ref 3.5–5.2)
SODIUM: 142 mmol/L (ref 134–144)
TOTAL PROTEIN: 6.8 g/dL (ref 6.0–8.5)

## 2017-07-25 LAB — BANDED NEUTROPHILS ABSOLUTE COUNT: Lab: 0

## 2017-07-25 LAB — MAGNESIUM: Lab: 2

## 2017-07-25 LAB — BILIRUBIN DIRECT: Lab: 0.12

## 2017-07-25 LAB — BILIRUBIN TOTAL: Lab: 0.4

## 2017-07-25 LAB — PHOSPHORUS, SERUM: Lab: 3.2

## 2017-07-25 LAB — GAMMA GLUTAMYL TRANSFERASE: Lab: 15

## 2017-07-25 NOTE — Unmapped (Addendum)
*   addendum - patient switched to new partd plan, required PA. Both meds go through today for $8.35 copays. Patient requests generic meds now that copays are higher. I've copied coordinator on this note to ask if appropriate. Sending meds today for delivery tomorrow, Aug 17. - Meghan @ [facility]    [facility] Refill Coordination Note  Specialty Medication(s): MYFORTIC 180MG  AND PROGRAF 1MG   Additional Medications shipped: NONE    [name], DOB: Nov 12, 1966  Phone: [phone] (home) , Alternate phone contact: N/A  Phone or address changes today?: No  All above HIPAA information was verified with patient.  Shipping Address: [address]  [place] [address]   Insurance changes? No    Completed refill call assessment today to schedule patient's medication shipment from the [facility] [phone]).      Confirmed the medication and dosage are correct and have not changed: Yes, regimen is correct and unchanged.    Confirmed patient started or stopped the following medications in the past month:  No, there are no changes reported at this time.    Are you tolerating your medication?:  Markayla reports tolerating the medication.    ADHERENCE    Myfortic 180 mg   Quantity filled last month: 120   # of tablets left on hand: 12  Prograf 1 mg   Quantity filled last month: 120   # of tablets left on hand: 12      Did you miss any doses in the past 4 weeks? No missed doses reported.    FINANCIAL/SHIPPING    Delivery Scheduled: Yes, Expected medication delivery date: 07/27/17     [place] did not have any additional questions at this time.    Delivery address validated in [facility]SI scheduling system: Yes, address listed in FSI is correct.    We will follow up with patient monthly for standard refill processing and delivery.      Thank you,  Marletta Lor   [facility] Shared [facility] Specialty Pharmacist

## 2017-07-26 LAB — TACROLIMUS BLOOD: Lab: 4.7

## 2017-07-27 MED FILL — PROGRAF/1MG/CAP: PROGRAF/1MG/CAP | 30 days supply | Qty: 120 | Fill #3

## 2017-07-27 MED FILL — MYFORTIC/180MG/TAB: MYFORTIC/180MG/TAB | 30 days supply | Qty: 120 | Fill #1

## 2017-08-11 MED ORDER — MYCOPHENOLATE SODIUM 180 MG TABLET,DELAYED RELEASE: tablet | 9 refills | 0 days

## 2017-08-11 MED ORDER — MYCOPHENOLATE SODIUM 180 MG TABLET,DELAYED RELEASE
ORAL_TABLET | Freq: Two times a day (BID) | ORAL | 3 refills | 0.00000 days | Status: CP
Start: 2017-08-11 — End: 2017-08-11

## 2017-08-11 MED ORDER — TACROLIMUS 1 MG CAPSULE: 2 mg | capsule | Freq: Two times a day (BID) | 3 refills | 0 days | Status: AC

## 2017-08-11 MED ORDER — MYCOPHENOLATE SODIUM 180 MG TABLET,DELAYED RELEASE: 360 mg | tablet | Freq: Two times a day (BID) | 3 refills | 0 days | Status: AC

## 2017-08-11 MED ORDER — TACROLIMUS 1 MG CAPSULE
ORAL_CAPSULE | Freq: Two times a day (BID) | ORAL | 3 refills | 0.00000 days | Status: CP
Start: 2017-08-11 — End: 2017-08-11

## 2017-08-11 NOTE — Unmapped (Signed)
Had received a message from [facility] pharmacy in the last 2 weeks about patient's brand prograf and myfortic copays being around $8.35 and patient requesting generic in the future for affordability. Also they had just sent a refill out of the brand.     Talked with patient about the above and patient confirmed she is requesting to change to generic prograf and myfortic due to costs. Mentioned we would speak with provider about this transition and verbalized understanding she will need to get more frequent labs for a short time by changing to generic to ensure labs are stable.     Talked with Ermalinda Barrios, NP about the above and fine for patient to change to generic tacrolimus and myfortic and to get labs 2 weeks after she starts and 2 weeks after this.     Talked with patient who verbalized understanding:   1) Fine to change to generic tac and mycophenolate    2) Call us when she starts; patient thinks it will be at the end of the month    3) Get labs 2x a week after she starts generic medications and 2 weeks after this.     Originally scripts sent to her [facility] but called and let them know to cancel these scripts since patient getting at [facility] shared service pharmacy. Escripted to [facility] shared service and sending inbasket message to [facility] that these scripts sent.

## 2017-08-17 LAB — COMPREHENSIVE METABOLIC PANEL
A/G RATIO: 1.8 (ref 1.2–2.2)
ALBUMIN: 4.3 g/dL (ref 3.5–5.5)
ALKALINE PHOSPHATASE: 79 IU/L (ref 39–117)
ALT (SGPT): 47 IU/L — ABNORMAL HIGH (ref 0–32)
AST (SGOT): 25 IU/L (ref 0–40)
BILIRUBIN TOTAL: 0.3 mg/dL (ref 0.0–1.2)
BLOOD UREA NITROGEN: 22 mg/dL (ref 6–24)
BUN / CREAT RATIO: 22 (ref 9–23)
CALCIUM: 9.5 mg/dL (ref 8.7–10.2)
CHLORIDE: 105 mmol/L (ref 96–106)
CREATININE: 1.01 mg/dL — ABNORMAL HIGH (ref 0.57–1.00)
GLUCOSE: 100 mg/dL — ABNORMAL HIGH (ref 65–99)
POTASSIUM: 5.2 mmol/L (ref 3.5–5.2)
SODIUM: 140 mmol/L (ref 134–144)

## 2017-08-17 LAB — CBC W/ DIFFERENTIAL
BASOPHILS ABSOLUTE COUNT: 0 10*3/uL (ref 0.0–0.2)
BASOPHILS RELATIVE PERCENT: 1 %
EOSINOPHILS ABSOLUTE COUNT: 0.2 10*3/uL (ref 0.0–0.4)
EOSINOPHILS RELATIVE PERCENT: 3 %
HEMATOCRIT: 38.2 % (ref 34.0–46.6)
HEMOGLOBIN: 12.8 g/dL (ref 11.1–15.9)
IMMATURE GRANULOCYTES: 1 %
LYMPHOCYTES ABSOLUTE COUNT: 2.2 10*3/uL (ref 0.7–3.1)
LYMPHOCYTES RELATIVE PERCENT: 37 %
MEAN CORPUSCULAR HEMOGLOBIN CONC: 33.5 g/dL (ref 31.5–35.7)
MEAN CORPUSCULAR HEMOGLOBIN: 28.1 pg (ref 26.6–33.0)
MEAN CORPUSCULAR VOLUME: 84 fL (ref 79–97)
MONOCYTES RELATIVE PERCENT: 7 %
NEUTROPHILS ABSOLUTE COUNT: 3 10*3/uL (ref 1.4–7.0)
NEUTROPHILS RELATIVE PERCENT: 51 %
PLATELET COUNT: 207 10*3/uL (ref 150–379)
RED BLOOD CELL COUNT: 4.55 x10E6/uL (ref 3.77–5.28)
RED CELL DISTRIBUTION WIDTH: 14.3 % (ref 12.3–15.4)
WHITE BLOOD CELL COUNT: 5.9 10*3/uL (ref 3.4–10.8)

## 2017-08-17 LAB — MONOCYTES RELATIVE PERCENT: Lab: 7

## 2017-08-17 LAB — SODIUM: Lab: 140

## 2017-08-17 LAB — MAGNESIUM
Lab: 2
MAGNESIUM: 2 mg/dL (ref 1.6–2.3)

## 2017-08-17 LAB — GAMMA GLUTAMYL TRANSFERASE: Lab: 15

## 2017-08-17 LAB — PHOSPHORUS, SERUM: Lab: 3.1

## 2017-08-17 LAB — BILIRUBIN DIRECT: Lab: 0.12

## 2017-08-17 NOTE — Unmapped (Signed)
[facility] Refill Coordination Note  Specialty Medication(s): prograf 1mg , myfortic 180mg   Additional Medications shipped: none    Evelyn Rollins, DOB: 08-11-1966  Phone: [phone] (home) , Alternate phone contact: N/A  Phone or address changes today?: No  All above HIPAA information was verified with patient.  Shipping Address: [address]  [place] [address]   Insurance changes? No    Completed refill call assessment today to schedule patient's medication shipment from the [facility] [phone]).      Confirmed the medication and dosage are correct and have not changed: Yes, regimen is correct and unchanged.    Confirmed patient started or stopped the following medications in the past month:  No, there are no changes reported at this time.    Are you tolerating your medication?:  Evelyn Rollins reports tolerating the medication.    ADHERENCE    (Below is required for Medicare Part B or Transplant patients only - per drug):     Prograf 1 mg   Quantity filled last month: 120   # of tablets left on hand: 36      Myfortic 180 mg   Quantity filled last month: 120   # of tablets left on hand: 36      Did you miss any doses in the past 4 weeks? Yes.  Evelyn Rollins reports missing 1 days of medication therapy in the last 4 weeks.  Evelyn Rollins reports forgot to take it with her when went to appointment, and when got home was too close to next dose to take it as the cause of their non-adherance. Counseled patient on importance of adherence and using calendars, pill boxes, and alarms.    FINANCIAL/SHIPPING    Delivery Scheduled: Yes, Expected medication delivery date: 08/24/2017     Evelyn Rollins did not have any additional questions at this time.    Delivery address validated in [facility]SI scheduling system: Yes, address listed in FSI is correct.    We will follow up with patient monthly for standard refill processing and delivery.      Thank you,  Thad Ranger   [facility] Shared [facility] Specialty Pharmacist

## 2017-08-17 NOTE — Unmapped (Signed)
[facility] Special[facility] Pharmacy Refill and Clinical Coordination Note  Medication(s): PROGRAF 1MG , MYFORTIC 180MG     Evelyn Rollins, DOB: 1966-09-10  Phone: [phone] (home) , Alternate phone contact: N/A  Shipping address: [address]  [place] [address]  Phone or address changes today?: No  All above HIPAA information verified.  Insurance changes? No    Completed refill and clinical call assessment today to schedule patient's medication shipment from the [facility] [phone]).      MEDICATION RECONCILIATION    Confirmed the medication and dosage are correct and have not changed: Yes, regimen is correct and unchanged.    Were there any changes to your medication(s) in the past month:  No, there are no changes reported at this time.    ADHERENCE    Is this medicine transplant or covered by Medicare Part B? Yes.    Prograf 1 mg   Quantity filled last month: 120   # of tablets left on hand: 36      Myfortic 180 mg   Quantity filled last month: 120   # of tablets left on hand: 36      Did you miss any doses in the past 4 weeks? Yes.  Evelyn Rollins reports missing 1 days of medication therapy in the last 4 weeks.  Evelyn Rollins reports forgot it when left house for appointment and when get home was too close to next dose to take it as the cause of their non-adherance.  Adherence counseling provided? Yes, discussed the following ways to help with adherence: pill box, calendar, alarm on phone.Marland Kitchen     SIDE EFFECT MANAGEMENT    Are you tolerating your medication?:  Evelyn Rollins reports tolerating the medication.  Side effect management discussed: None      Therapy is appropriate and should be continued.    Evidence of clinical benefit: See Epic note from 07/03/2017      FINANCIAL/SHIPPING    Delivery Scheduled: Yes, Expected medication delivery date: 08/24/2017   Additional medications refilled: No additional medications/refills needed at this time.    Evelyn Rollins did not have any additional questions at this time. Delivery address validated in FSI scheduling system: Yes, address listed above is correct.      We will follow up with patient monthly for standard refill processing and delivery.      Thank you,  Thad Ranger   [facility] Shared [facility] Specialty Pharmacist

## 2017-08-18 LAB — TACROLIMUS BLOOD: Lab: 4.1

## 2017-08-18 NOTE — Unmapped (Signed)
Called and left a VM for patient cautioning her that the K+ is 5.2 but other labs are stable. Mentioned moderation with foods high in K+ such as potatoes, tomatoes, oranges, bananas, coconut, peanuts and peanut butter.

## 2017-08-21 MED FILL — MYFORTIC/180MG/TAB: MYFORTIC/180MG/TAB | 30 days supply | Qty: 120 | Fill #2

## 2017-08-21 MED FILL — PROGRAF/1MG/CAP: PROGRAF/1MG/CAP | 30 days supply | Qty: 120 | Fill #4

## 2017-09-04 ENCOUNTER — Ambulatory Visit
Admission: RE | Admit: 2017-09-04 | Discharge: 2017-09-04 | Disposition: A | Discharge status: Home / Self Care | Payer: MEDICARE | Attending: Obesity Medicine | Admitting: Obesity Medicine

## 2017-09-04 DIAGNOSIS — Z6841 Body Mass Index (BMI) 40.0 and over, adult: Secondary | ICD-10-CM

## 2017-09-04 DIAGNOSIS — Z944 Liver transplant status: Principal | ICD-10-CM

## 2017-09-04 DIAGNOSIS — K439 Ventral hernia without obstruction or gangrene: Secondary | ICD-10-CM

## 2017-09-04 NOTE — Unmapped (Signed)
Summary at initial presentation  Evelyn Rollins is a 51 y.o. female and Body mass index is 44.74 kg/m??..  She presents to the [facility] interested in lifestyle , pharmacologic and surgical treatments for her obesity with her motivation for weight loss being improvement in health of her transplanted liver as well as to be able to undergo abdominal reconstruction surgery for which she needs 20 pound weight loss..The causes of her obesity are multifactorial and include strong genetic predisposition , metabolic effects of consumption of processed food, irregular eating patterns causing circadian disruption, inadequate sleep duration, poor sleep quality, increased life stress and weight gain promoting medication Lexapro .  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 She has no disordered eating patterns which may be barriers to a good treatment response. Clearly her overall quality of life is compromised by her weight and she is motivated to be a part of our medical weight program.    Treatment course  Starting weight at presentation:236.6 lb  Weight today: 219 lbs  Total weight loss since presentation:17.6 lbs  Percent weight loss since presentation:7.4 %  Percent weight loss with low carb diet : 7.4 %        Previous use of anti-obesity medications:  None    Narrative history  ????No c/o   ??  Diet: Has been following the low carb diet. Not much processed foods  ??  ??Exercise:No regular exercise  ????  Sleep: Had a sleep study , results not available. Getting 5-6 hrs sleep and waking up twice a night.  ????  Stress: 5/10  ??????  Medications: No medication changes.      Patient Active Problem List    Diagnosis Date Noted   ??? 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 Bile leak, postoperative 07/06/2015   ??? Liver transplanted (CMS-HCC) 07/05/2015   ??? Hyperkalemia 06/17/2015   ??? Acute kidney injury (CMS-HCC) 06/17/2015   ??? Hyponatremia 06/16/2015   ??? Fatigue 06/16/2015   ??? Anxiety 06/16/2015   ??? Shortness of breath 03/20/2015   ??? Thrombocytopenia (CMS-HCC) 03/20/2015   ??? Ascites 03/19/2015   ??? Depression 02/10/2015       Current Outpatient Prescriptions on File Prior to Visit   Medication Sig Dispense Refill   ??? acetaminophen (TYLENOL) 325 MG tablet Take 650 mg by mouth daily as needed.     ??? amLODIPine (NORVASC) 5 MG tablet Take 5 mg by mouth daily.     ??? aspirin (ECOTRIN) 81 MG tablet Take 1 tablet (81 mg total) by mouth daily. 30 tablet 11   ??? cholecalciferol, vitamin D3, (VITAMIN D3) 2,000 unit cap Take 1 capsule by mouth daily at 0600.     ??? escitalopram oxalate (LEXAPRO) 10 MG tablet Take 10 mg by mouth daily.      ??? levETIRAcetam (KEPPRA) 250 MG tablet Take 1 tablet (250 mg total) by mouth Two (2) times a day. 120 tablet 0   ??? levothyroxine (SYNTHROID, LEVOTHROID) 112 MCG tablet TAKE ONE TABLET BY MOUTH ONCE DAILY     ??? levothyroxine (SYNTHROID, LEVOTHROID) 125 MCG tablet Take 1 tablet (125 mcg total) by mouth daily at 0600. 30 tablet 3   ??? LORazepam (ATIVAN) 0.5 MG tablet Take 1 mg by mouth two (2) times a day as needed for anxiety.      ??? magnesium oxide (MAG-OX) 400 mg tablet Take  400 mg by mouth.     ??? mycophenolate (MYFORTIC) 180 MG EC tablet Take 2 tablets (360 mg total) by mouth Two (2) times a day. 360 tablet 3   ??? pravastatin (PRAVACHOL) 40 MG tablet Take 40 mg by mouth daily. PCP started and managing     ??? tacrolimus (PROGRAF) 1 MG capsule Take 2 capsules (2 mg total) by mouth Two (2) times a day. 360 capsule 3   ??? triamcinolone (KENALOG) 0.5 % ointment Apply 1 application topically daily as needed.        No current facility-administered medications on file prior to visit.        Blood pressure 125/74, pulse 73, height 157.5 cm (5' 2.01), weight 99.6 kg (219 lb 8 oz), not currently breastfeeding.  Body mass index is 40.14 kg/m??.      Exam:  AAO x3  Chest: clear to auscultation  Abdomen: NAD  Ext: No pedal edema      Plan on 05/01/2017  Based on the possible etiologies for the patients obesity , my recommendations include the following :  ????????Diet quality and patterning: Based on her 24-hour dietary recall, patient tends to eat processed foods frequently and has a high intake of carbohydrates including processed carbohydrates. Based on increasing liver enzymes and a possibility of recurrence of fatty liver disease, she may benefit from low carbohydrate diet. I have discussed the principles of the diet, but she will return to follow-up with the medical weight program dietitian for details.  ??  ????????Exercise:????I reviewed the importance of exercise for ongoing weight loss and long term weight maintenance with the patient.??  ??      Sleep: Patient has poor sleep hygiene, says that she has difficulty falling asleep even though she is in bed and drinks 16 ounces of water before going to bed which makes her wake up 3 times at night to use the bathroom. We talked about reducing fluid intake 2 hours before bedtime. I also directed her to the website to help improve her sleep hygiene. She will start melatonin 5 mg at night 1 hour before bedtime regularly. Patient has a STOP-BA NG score of 6 out of 8. She will discuss with her primary care physician regarding getting a sleep study closer to home.  ??  ???? Stress management: We talked about strategies for stress management including meditation, yoga, prayer and going to church.  ????   ???? Weight gain causing medications: Patient is on Lexapro which could contribute to her weight gain. Other options such as fluoxetine and sertraline may be better from the weight gain perspective.  ??  ???? Anti Obesity Medications: At this time I would avoid using any anti-obesity medications as her liver enzymes are increasing. After we are able to stabilize the liver enzymes, and a trial of lifestyle changes including a low carbohydrate diet, we would consider adding anti-obesity medications. Anti-obesity medications that may be appropriate for patient include metformin, topiramate. I would not use Wellbutrin or phentermine due to history of seizures. Liraglutide may be tried but it is unlikely that her insurance would cover the prescription.  ??  ??  Obesity Surgery: Obesity surgery is an appropriate choice for the patient due to her multiple comorbidities and a BMI greater than 44. She is however unwilling to consider surgery at this time but willing to revisit the idea if medical therapies do not work.    Plan on 07/03/2017  Has been following the low carb diet. Doing well with  consistent weight loss.  Continue with low carb diet and follow up with me in 2 months.    Plan on 09/04/2017  Doing well with the low carb diet.  No need for medications at this time.   F/u with me in 2 months.      Counselling  Total visit duration 25 min. >50% of the visit was used to counsel patient.

## 2017-09-12 LAB — CBC W/ DIFFERENTIAL
BANDED NEUTROPHILS ABSOLUTE COUNT: 0 10*3/uL (ref 0.0–0.1)
BASOPHILS ABSOLUTE COUNT: 0 10*3/uL (ref 0.0–0.2)
BASOPHILS RELATIVE PERCENT: 1 %
EOSINOPHILS ABSOLUTE COUNT: 0.1 10*3/uL (ref 0.0–0.4)
EOSINOPHILS RELATIVE PERCENT: 2 %
HEMATOCRIT: 42 % (ref 34.0–46.6)
HEMOGLOBIN: 13.1 g/dL (ref 11.1–15.9)
IMMATURE GRANULOCYTES: 1 %
LYMPHOCYTES ABSOLUTE COUNT: 2.3 10*3/uL (ref 0.7–3.1)
LYMPHOCYTES RELATIVE PERCENT: 37 %
MEAN CORPUSCULAR HEMOGLOBIN CONC: 31.2 g/dL — ABNORMAL LOW (ref 31.5–35.7)
MEAN CORPUSCULAR HEMOGLOBIN: 27.2 pg (ref 26.6–33.0)
MONOCYTES ABSOLUTE COUNT: 0.4 10*3/uL (ref 0.1–0.9)
MONOCYTES RELATIVE PERCENT: 7 %
NEUTROPHILS ABSOLUTE COUNT: 3.3 10*3/uL (ref 1.4–7.0)
NEUTROPHILS RELATIVE PERCENT: 52 %
PLATELET COUNT: 214 10*3/uL (ref 150–379)
RED BLOOD CELL COUNT: 4.81 x10E6/uL (ref 3.77–5.28)
RED CELL DISTRIBUTION WIDTH: 14.9 % (ref 12.3–15.4)

## 2017-09-12 LAB — COMPREHENSIVE METABOLIC PANEL
A/G RATIO: 2 (ref 1.2–2.2)
ALBUMIN: 4.4 g/dL (ref 3.5–5.5)
ALKALINE PHOSPHATASE: 84 IU/L (ref 39–117)
ALT (SGPT): 43 IU/L — ABNORMAL HIGH (ref 0–32)
AST (SGOT): 24 IU/L (ref 0–40)
BILIRUBIN TOTAL: 0.3 mg/dL (ref 0.0–1.2)
BUN / CREAT RATIO: 21 (ref 9–23)
CALCIUM: 9.3 mg/dL (ref 8.7–10.2)
CHLORIDE: 107 mmol/L — ABNORMAL HIGH (ref 96–106)
CREATININE: 0.87 mg/dL (ref 0.57–1.00)
GLUCOSE: 95 mg/dL (ref 65–99)
SODIUM: 144 mmol/L (ref 134–144)
TOTAL PROTEIN: 6.6 g/dL (ref 6.0–8.5)

## 2017-09-12 LAB — PHOSPHORUS: PHOSPHORUS, SERUM: 3.4 mg/dL (ref 2.5–4.5)

## 2017-09-12 LAB — MAGNESIUM
Lab: 1.9
MAGNESIUM: 1.9 mg/dL (ref 1.6–2.3)

## 2017-09-12 LAB — GLUCOSE: Lab: 95

## 2017-09-12 LAB — BILIRUBIN DIRECT: Lab: 0.1

## 2017-09-12 LAB — PHOSPHORUS, SERUM: Lab: 3.4

## 2017-09-12 LAB — BANDED NEUTROPHILS ABSOLUTE COUNT: Lab: 0

## 2017-09-12 LAB — GAMMA GLUTAMYL TRANSFERASE: Lab: 13

## 2017-09-13 LAB — TACROLIMUS BLOOD: Lab: 5.4

## 2017-09-13 NOTE — Unmapped (Signed)
[facility] Refill Coordination Note  Specialty Medication(s): MYCOPHENOLIC ACID 180MG , TACROLIMUS 1MG   Additional Medications shipped: NONE    Evelyn Rollins, DOB: 1966/11/30  Phone: [phone] (home) , Alternate phone contact: N/A  Phone or address changes today?: No  All above HIPAA information was verified with patient.  Shipping Address: [address]  [place] [address]   Insurance changes? No    Completed refill call assessment today to schedule patient's medication shipment from the [facility] [phone]).      Confirmed the medication and dosage are correct and have not changed: regimen has not changed, however physician has now allowed generic filling. Patient got brand last month but has decided she wants to try generics this month - so filling on hold rxs for generics    Confirmed patient started or stopped the following medications in the past month:  No, there are no changes reported at this time.    Are you tolerating your medication?:  Evelyn Rollins reports tolerating the medication.    ADHERENCE    Myfortic 180 mg   Quantity filled last month: 120   # of tablets left on hand: 32    Prograf 1 mg   Quantity filled last month: 120   # of tablets left on hand: 32          Did you miss any doses in the past 4 weeks? Yes.  Evelyn Rollins reports missing 1 days of medication therapy in the last 4 weeks.  Evelyn Rollins reports forgot to take it with her and got home too late (didn't want to double up) as the cause of their non-adherance. we talked about importance of adherence and about using calendars and alarms to help remember.    FINANCIAL/SHIPPING    Delivery Scheduled: Yes, Expected medication delivery date: 09/19/2017     Evelyn Rollins did not have any additional questions at this time.    Delivery address validated in FSI scheduling system: Yes, address listed in FSI is correct.    We will follow up with patient monthly for standard refill processing and delivery.      Thank you,  Thad Ranger   [facility] Specialty Pharmacist

## 2017-09-18 MED FILL — MYCOPHENOLIC ACID DR/180MG/TABS: MYCOPHENOLIC ACID DR/180MG/TABS | 30 days supply | Qty: 120 | Fill #0

## 2017-09-18 MED FILL — TACROLIMUS/1MG/CAPS: TACROLIMUS/1MG/CAPS | 30 days supply | Qty: 120 | Fill #0

## 2017-09-19 NOTE — Unmapped (Signed)
Return Visit Exam   51 y.o. female PMHx notable for cirrhosis, liver disease, obesity, sx notable for with liver transplant,C-Section x2, ERCP  with a reducible, minimally symptomatic, nonobstructive ventral incisional hernia. She is up to date on her screening tests including colonoscopy and mammography. She would benefit from eventual hernia repair but given minimal symptoms, this is non-urgent.     PT BMI down to 40.14  Obtained 02/2017 CT A/P images from [facility]           Assessment:             Plan:             Subjective:       Evelyn Rollins is seen in followup for the evaluation of .  She was last seen in the clinic on 05/03     History of Present Illness:  Evelyn Rollins is a 51 y.o. year old female      Objective:   Objective   Past Medical History:  Past Medical History:   Diagnosis Date   ??? Cirrhosis (CMS-HCC)    ??? Hypothyroidism    ??? NAFLD (nonalcoholic fatty liver disease)    ??? Obesity    ??? Seizure (CMS-HCC) Dec 2015       Past Surgical History:  Past Surgical History:   Procedure Laterality Date   ??? CESAREAN SECTION  1987, 1989    2   ??? ESOPHAGOGASTRODUODENOSCOPY  recently   ??? HYSTERECTOMY  2012   ??? PR ERCP BALLOON DILATE BILIARY/PANC DUCT/AMPULLA EA N/A 09/02/2015    Procedure: ERCP;WITH TRANS-ENDOSCOPIC BALLOON DILATION OF BILIARY/PANCREATIC DUCT(S) OR OF AMPULLA, INCLUDING SPHINCTERECTOMY, WHEN PERFOREMD,EACH DUCT ([address]11);  Surgeon: Malcolm Metro, MD;  Location: [facility];  Service: Gastroenterology   ??? PR ERCP REMOVE FOREIGN BODY/STENT BILIARY/PANC DUCT N/A 09/02/2015    Procedure: ENDOSCOPIC RETROGRADE CHOLANGIOPANCREATOGRAPHY (ERCP); W/ REMOVAL OF FOREIGN BODY/STENT FROM BILIARY/PANCREATIC DUCT(S);  Surgeon: Malcolm Metro, MD;  Location: [facility];  Service: Gastroenterology   ??? PR ERCP STENT PLACEMENT BILIARY/PANCREATIC DUCT N/A 07/07/2015    Procedure: ENDOSCOPIC RETROGRADE CHOLANGIOPANCREATOGRAPHY (ERCP); WITH PLACEMENT OF ENDOSCOPIC STENT INTO BILIARY OR PANCREATIC DUCT;  Surgeon: Malcolm Metro, MD;  Location: [facility]URES [facility];  Service: Gastroenterology   ??? PR ERCP,W/REMOVAL STONE,BIL/PANCR DUCTS N/A 09/02/2015    Procedure: ERCP; W/ENDOSCOPIC RETROGRADE REMOVAL OF CALCULUS/CALCULI FROM BILIARY &/OR PANCREATIC DUCTS;  Surgeon: Malcolm Metro, MD;  Location: GI [facility]URES [facility];  Service: Gastroenterology   ??? PR TRANSPLANT LIVER,ALLOTRANSPLANT N/A 06/24/2015    Procedure: LIVER ALLOTRANSPLANTATION; ORTHOTOPIC, PARTIAL OR WHOLE, FROM CADAVER OR LIVING DONOR, ANY AGE;  Surgeon: Vivi Barrack, MD;  Location: MAIN OR [facility];  Service: Transplant   ??? PR TRANSPLANT,PREP DONOR LIVER, WHOLE N/A 06/24/2015    Procedure: [facility] STD PREP CAD DONOR WHOLE LIVER GFT PRIOR TNSPLNT,INC CHOLE,DISS/REM SURR TISSU WO TRISEG/LOBE SPLT;  Surgeon: Vivi Barrack, MD;  Location: MAIN OR [place];  Service: Transplant       Medications:  @ACTMED @    Allergies:  No Known Allergies    Family History:  Family History   Problem Relation Age of Onset   ??? Breast cancer Mother    ??? Thyroid cancer Mother    ??? Ovarian cancer Mother    ??? Cancer Mother    ??? COPD Father    ??? Diabetes Father    ??? Melanoma Father    ??? Other Father         pacemaker  Social History:  Social History   Substance Use Topics   ??? Smoking status: Never Smoker   ??? Smokeless tobacco: Never Used   ??? Alcohol use No       Review of Systems:        Physical Exam:   VITAL SIGNS: There were no vitals filed for this visit.

## 2017-09-21 ENCOUNTER — Ambulatory Visit: Admission: RE | Admit: 2017-09-21 | Discharge: 2017-09-21 | Payer: MEDICARE | Attending: Surgery | Admitting: Surgery

## 2017-09-21 DIAGNOSIS — Z944 Liver transplant status: Principal | ICD-10-CM

## 2017-09-21 DIAGNOSIS — Z6841 Body Mass Index (BMI) 40.0 and over, adult: Secondary | ICD-10-CM

## 2017-09-21 DIAGNOSIS — K432 Incisional hernia without obstruction or gangrene: Secondary | ICD-10-CM

## 2017-09-21 NOTE — Unmapped (Signed)
[facility] HERNIA C[facility] FOLLOW-UP APPOINTMENT  09/21/2017    Patient: Evelyn Rollins  DOB: 23-Apr-1966  Previously seen in clinic on: 04/13/17  Primary Care Provider:  Rebecka Apley, NP      Subjective:   51 y.o. female with h/o liver transplant and a body mass index 45.35 kg/m??. with a reducible, minimally symptomatic, nonobstructive ventral incisional hernia last seen by [place] on 04/13/2017. Her  goal was a 20 pound weight loss (Had BMI 45, 240 pounds) for 3 months. She is being followed by Dr. Conception Chancy who she last saw on 9/24 for help with weight loss. She endorses an 18 pound weight loss over the past 4-5 months with a low carb diet. She continues to endorse mild discomfort with her hernia, mostly aggravated by movement. She denies any changes in bowel/urinary habits, new smoking, or inability to tolerate po intake.        Objective:     Vitals:    09/21/17 0938   BP: 127/80   Pulse: 80   Temp: 36.8 ??C (98.2 ??F)   SpO2: 95%        Wt Readings from Last 12 Encounters:   09/21/17 (!) 100.9 kg (222 lb 6.4 oz)   09/04/17 99.6 kg (219 lb 8 oz)   07/03/17 (!) 103 kg (227 lb)   07/03/17 (!) 102.6 kg (226 lb 1.6 oz)   05/31/17 (!) 104.6 kg (230 lb 9.6 oz)   05/17/17 (!) 106.6 kg (235 lb 1.6 oz)   05/01/17 (!) 107.4 kg (236 lb 12.8 oz)   04/13/17 (!) 108.9 kg (240 lb)   06/30/16 (!) 103.2 kg (227 lb 8 oz)   01/11/16 95.8 kg (211 lb 1.6 oz)   01/11/16 95.8 kg (211 lb 1.6 oz)   10/05/15 89.8 kg (198 lb)         Physical Exam:  General: No acute distress  Resp: normal work of breathing  CV: RRR  Abdomen: soft, obese, reducible incisional ventral hernia, no definite fascial defect appreciate    Imaging Results:  Personally reviewed      Assessment & Plan:    51 yo F with h/o liver transplant, obesity (BMI 40) who presents as follow up for her ventral incisional hernia of 1 year duration. Patient will benefit from eventual hernia repair, however repair is non urgent in nature.    -Recommend continued weight loss with goal BMI of < 35 to decrease susceptibility to perioperative complications including; hernia recurrence, wound dehiscence, SSI, etc.  -Plan for follow up in 3 months for further evaluation           Note initiated by:  Ala Dach, MD - surgical resident    ATTESTATION signed by Kristopher Oppenheim, MD, MPH at 09/21/17 6:07 PM   I saw and evaluated the patient with the resident after the initial interview.   I reviewed the resident???s note, have edited where appropriate, and agree with the resident???s findings and plan.  -- Foster Simpson, MD, MPH    Pt with BMI of 40.67 and has been working with [employer].  Now down 18 pounds since last clinic visit.  Pt with pain and discomfort from hernia, but no obstructive symptoms.  We discussed the risk/benefit of pursuing surgery at this time.  Given her continued high BMI any surgical operation will be much more difficult and have higher risk of complications.      Because of her prior operations and size of hernia, she would need an  extensive surgery - Large open ventral hernia repair with vertical midline incision from xiphoid to pubis.  I would need to lyse all adhesions and then given her chevron incision, rectus atrophy, and size of hernia she will likely require a posterior component separation.    I would like her to continue to optimize her weight to decrease risk of infection, recurrence, and other perioperative complications.  Ideally, she would have BMI<35.      ?? Pt to continue to abstain from smoking.  ?? We set a weight loss goal of BMI of 35 for surgery.  We did discuss that excess weight can increase risk of hernia recurrence, wound infection, poor wound healing, and other perioperative complications.  ?? We did discuss reducible versus incarcerated hernias.  We also discussed signs and symptoms of obstruction and strangulation.  If pt develops any of these concerning signs or symptoms, pt should call my office and/or go to the nearest emergency room for evaluation.  ?? If pt does require urgent/emergent surgery before the scheduled elective surgery, I will make every effort to be available for her surgical care.  ?? I will see the patient back in clinic in 3 months for evaluation of goals, assessment and monitoring of symptoms and possible discussion of surgery.

## 2017-09-21 NOTE — Unmapped (Addendum)
You were seen at the [facility] by Dr. Thomes Cake for evaluation of ventral hernia.    ?? Please continue to abstain from smoking.  ?? We set a weight loss goal of BMI of 35 by the next clinic visit. Continue working with Dr. Conception Chancy. We did discuss that excess weight can increase risk of hernia recurrence, wound infection, poor wound healing, and other perioperative complications.  ?? If you do require urgent/emergent surgery before the scheduled elective surgery, I will make every effort to be available for your surgical care.  ?? I will see the you back in clinic in 3 months for evaluation of goals, assessment and monitoring of symptoms and possible discussion of surgery.            If you have any questions, please don't hesitate to call our office:  [facility] Department of Surgery, Division of General and Acute Care Surgery  [address]  [place], [place]  [address]  Phone: [phone]

## 2017-10-13 NOTE — Unmapped (Signed)
[facility] Refill and Clinical Coordination Note  Medication(s): TACROLIMUS 1 AND MYCOPHENOLATE 180    Evelyn Rollins, DOB: 1966/08/02  Phone: [phone] (home) , Alternate phone contact: N/A  Shipping address: [address]  [place] [address]  Phone or address changes today?: No  All above HIPAA information verified.  Insurance changes? No    Completed refill and clinical call assessment today to schedule patient's medication shipment from the [facility] [phone]).      MEDICATION RECONCILIATION    Confirmed the medication and dosage are correct and have not changed: Yes, regimen is correct and unchanged.    Were there any changes to your medication(s) in the past month:  No, there are no changes reported at this time.    ADHERENCE    Is this medicine transplant or covered by Medicare Part B? Yes.    Mycophenolic Acid 180 mg   Quantity filled last month: 120   # of tablets left on hand: 40  Tacrolimus 1 mg   Quantity filled last month: 120   # of tablets left on hand: 40      Did you miss any doses in the past 4 weeks? No missed doses reported.  Adherence counseling provided? Not needed     SIDE EFFECT MANAGEMENT    Are you tolerating your medication?:  Evelyn Rollins reports tolerating the medication.  Side effect management discussed: None      Therapy is appropriate and should be continued.    Evidence of clinical benefit: See Epic note from 09/04/17      FINANCIAL/SHIPPING    Delivery Scheduled: Yes, Expected medication delivery date: 10/17/17   Additional medications refilled: No additional medications/refills needed at this time.    Evelyn Rollins did not have any additional questions at this time.    Delivery address validated in FSI scheduling system: Yes, address listed above is correct.      We will follow up with patient monthly for standard refill processing and delivery.      Thank you,  Mickle Mallory   [facility] Specialty Pharmacist

## 2017-10-15 MED FILL — TACROLIMUS/1MG/CAPS: TACROLIMUS/1MG/CAPS | 30 days supply | Qty: 120 | Fill #1

## 2017-10-15 MED FILL — MYCOPHENOLIC ACID DR/180MG/TABS: MYCOPHENOLIC ACID DR/180MG/TABS | 30 days supply | Qty: 120 | Fill #1

## 2017-10-21 LAB — COMPREHENSIVE METABOLIC PANEL
ALBUMIN: 4.4 g/dL (ref 3.5–5.5)
ALKALINE PHOSPHATASE: 78 IU/L (ref 39–117)
ALT (SGPT): 54 IU/L — ABNORMAL HIGH (ref 0–32)
AST (SGOT): 32 IU/L (ref 0–40)
BILIRUBIN TOTAL: 0.4 mg/dL (ref 0.0–1.2)
BLOOD UREA NITROGEN: 22 mg/dL (ref 6–24)
BUN / CREAT RATIO: 21 (ref 9–23)
CALCIUM: 9.6 mg/dL (ref 8.7–10.2)
CHLORIDE: 107 mmol/L — ABNORMAL HIGH (ref 96–106)
CO2: 23 mmol/L (ref 20–29)
CREATININE: 1.06 mg/dL — ABNORMAL HIGH (ref 0.57–1.00)
GLOBULIN, TOTAL: 2.1 g/dL (ref 1.5–4.5)
POTASSIUM: 5.2 mmol/L (ref 3.5–5.2)
SODIUM: 143 mmol/L (ref 134–144)
TOTAL PROTEIN: 6.5 g/dL (ref 6.0–8.5)

## 2017-10-21 LAB — CBC W/ DIFFERENTIAL
BANDED NEUTROPHILS ABSOLUTE COUNT: 0 10*3/uL (ref 0.0–0.1)
BASOPHILS RELATIVE PERCENT: 0 %
HEMATOCRIT: 39.8 % (ref 34.0–46.6)
HEMOGLOBIN: 13 g/dL (ref 11.1–15.9)
IMMATURE GRANULOCYTES: 0 %
LYMPHOCYTES ABSOLUTE COUNT: 2.5 10*3/uL (ref 0.7–3.1)
LYMPHOCYTES RELATIVE PERCENT: 37 %
MEAN CORPUSCULAR HEMOGLOBIN CONC: 32.7 g/dL (ref 31.5–35.7)
MEAN CORPUSCULAR HEMOGLOBIN: 27.3 pg (ref 26.6–33.0)
MEAN CORPUSCULAR VOLUME: 84 fL (ref 79–97)
MONOCYTES ABSOLUTE COUNT: 0.5 10*3/uL (ref 0.1–0.9)
MONOCYTES RELATIVE PERCENT: 7 %
NEUTROPHILS ABSOLUTE COUNT: 3.5 10*3/uL (ref 1.4–7.0)
NEUTROPHILS RELATIVE PERCENT: 53 %
PLATELET COUNT: 224 10*3/uL (ref 150–379)
RED CELL DISTRIBUTION WIDTH: 14.7 % (ref 12.3–15.4)
WHITE BLOOD CELL COUNT: 6.7 10*3/uL (ref 3.4–10.8)

## 2017-10-21 LAB — MAGNESIUM: Lab: 1.7

## 2017-10-21 LAB — BILIRUBIN DIRECT: Lab: 0.14

## 2017-10-21 LAB — GAMMA GLUTAMYL TRANSFERASE: Lab: 16

## 2017-10-21 LAB — BASOPHILS RELATIVE PERCENT: Lab: 0

## 2017-10-21 LAB — PHOSPHORUS, SERUM: Lab: 3.3

## 2017-10-21 LAB — BILIRUBIN TOTAL: Lab: 0.4

## 2017-10-22 LAB — TACROLIMUS BLOOD: Lab: 5.1

## 2017-10-23 NOTE — Unmapped (Signed)
Patient's labs from 11/9 continue with elevated K. Left VM for patient to alert her and remind her to decrease potassium rich foods in her diet, as her primary coordinator already discussed with her in September. Also, encouraged her to drink at least 2 liters of water daily to assist with this.

## 2017-11-08 LAB — CBC W/ DIFFERENTIAL
BANDED NEUTROPHILS ABSOLUTE COUNT: 0 10*3/uL (ref 0.0–0.1)
BASOPHILS ABSOLUTE COUNT: 0 10*3/uL (ref 0.0–0.2)
BASOPHILS RELATIVE PERCENT: 0 %
EOSINOPHILS ABSOLUTE COUNT: 0.1 10*3/uL (ref 0.0–0.4)
EOSINOPHILS RELATIVE PERCENT: 2 %
HEMATOCRIT: 40.5 % (ref 34.0–46.6)
HEMOGLOBIN: 12.9 g/dL (ref 11.1–15.9)
IMMATURE GRANULOCYTES: 0 %
LYMPHOCYTES ABSOLUTE COUNT: 2.3 10*3/uL (ref 0.7–3.1)
LYMPHOCYTES RELATIVE PERCENT: 35 %
MEAN CORPUSCULAR HEMOGLOBIN CONC: 31.9 g/dL (ref 31.5–35.7)
MEAN CORPUSCULAR HEMOGLOBIN: 27.7 pg (ref 26.6–33.0)
MEAN CORPUSCULAR VOLUME: 87 fL (ref 79–97)
MONOCYTES RELATIVE PERCENT: 7 %
NEUTROPHILS ABSOLUTE COUNT: 3.8 10*3/uL (ref 1.4–7.0)
NEUTROPHILS RELATIVE PERCENT: 56 %
PLATELET COUNT: 232 10*3/uL (ref 150–379)
RED CELL DISTRIBUTION WIDTH: 14.9 % (ref 12.3–15.4)
WHITE BLOOD CELL COUNT: 6.8 10*3/uL (ref 3.4–10.8)

## 2017-11-08 LAB — COMPREHENSIVE METABOLIC PANEL
A/G RATIO: 2.1 (ref 1.2–2.2)
ALBUMIN: 4.4 g/dL (ref 3.5–5.5)
ALKALINE PHOSPHATASE: 81 IU/L (ref 39–117)
ALT (SGPT): 39 IU/L — ABNORMAL HIGH (ref 0–32)
AST (SGOT): 22 IU/L (ref 0–40)
BILIRUBIN TOTAL: 0.4 mg/dL (ref 0.0–1.2)
BLOOD UREA NITROGEN: 22 mg/dL (ref 6–24)
BUN / CREAT RATIO: 24 — ABNORMAL HIGH (ref 9–23)
CALCIUM: 9.4 mg/dL (ref 8.7–10.2)
CHLORIDE: 106 mmol/L (ref 96–106)
CO2: 23 mmol/L (ref 20–29)
CREATININE: 0.9 mg/dL (ref 0.57–1.00)
GLOBULIN, TOTAL: 2.1 g/dL (ref 1.5–4.5)
POTASSIUM: 5 mmol/L (ref 3.5–5.2)
SODIUM: 142 mmol/L (ref 134–144)
TOTAL PROTEIN: 6.5 g/dL (ref 6.0–8.5)

## 2017-11-08 LAB — PHOSPHORUS, SERUM: Lab: 3.2

## 2017-11-08 LAB — BILIRUBIN DIRECT: Lab: 0.13

## 2017-11-08 LAB — MAGNESIUM: Lab: 1.9

## 2017-11-08 LAB — GAMMA GLUTAMYL TRANSFERASE: Lab: 14

## 2017-11-08 LAB — PLATELET COUNT: Lab: 232

## 2017-11-08 LAB — CALCIUM: Lab: 9.4

## 2017-11-09 LAB — TACROLIMUS BLOOD: Lab: 4.8

## 2017-11-09 MED FILL — MYCOPHENOLIC ACID DR/180MG/TABS: MYCOPHENOLIC ACID DR/180MG/TABS | 30 days supply | Qty: 120 | Fill #2

## 2017-11-09 MED FILL — TACROLIMUS/1MG/CAPS: TACROLIMUS/1MG/CAPS | 30 days supply | Qty: 120 | Fill #2

## 2017-11-09 NOTE — Unmapped (Signed)
[facility] Refill Coordination Note  Specialty Medication(s): Tacrolimus 1mg  & Mycophenolic 180mg     Evelyn Rollins, DOB: [birth year]  Phone: [phone] (home) , Alternate phone contact: N/A  Phone or address changes today?: No  All above HIPAA information was verified with patient.  Shipping Address: [address]  [place] [address]   Insurance changes? No    Completed refill call assessment today to schedule patient's medication shipment from the [facility] [phone]).      Confirmed the medication and dosage are correct and have not changed: Yes, regimen is correct and unchanged.    Confirmed patient started or stopped the following medications in the past month:  No, there are no changes reported at this time.    Are you tolerating your medication?:  Evelyn Rollins reports tolerating the medication.    ADHERENCE    (Below is required for Medicare Part B or Transplant patients only - per drug):   How many tablets were dispensed last month:   Tacrolimus 1 mg   Quantity filled last month: 120   # of tablets left on hand: 6 days  Myfortic 180 mg   Quantity filled last month: 120   # of tablets left on hand: 6 days    Did you miss any doses in the past 4 weeks? No missed doses reported.    FINANCIAL/SHIPPING    Delivery Scheduled: Yes, Expected medication delivery date: 11/13/2017     Evelyn Rollins did not have any additional questions at this time.    Delivery address validated in FSI scheduling system: Yes, address listed in FSI is correct.    We will follow up with patient monthly for standard refill processing and delivery.      Thank you,  Evelyn Rollins   [facility] Specialty Technician

## 2017-11-16 NOTE — Unmapped (Signed)
Returned pt's call.   Patient asked what was the second injection she needed from her July appointment. Mentioned it was Shingrix and needs it within the 2-6 months; to have it by end of January. Mentioned there has been a shortage of shingrix and to check with her PCP, health dept., and local pharmacies. Mentioned if they do not have it to keep checking.     Mentioned she changed to generic mycophenolate and tacrolimus on Nov 20.   Reviewed her 11/07/17 labs with her that are stable. Patient getting labs again this week for weekly x2 and mentioned to skip a week and do labs again.     Patient mentioned she has lost 20lbs and went to surgeon who is encouraging her to lose more weight. Patient been exercising and taking zumba classes with family member.     Patient also mentioned her insurance told her they would not cover her IS medications next year unless provider approved it. Verbalized understanding to call her insurance to determine what needs to be done and either they need to fax [place] forms or give a website we are to go on and fill out a form since they will need to provider coverage for her IS medications.   Patient gets her IS medications at [facility] shared service pharmacy.

## 2017-12-01 LAB — CBC W/ DIFFERENTIAL
BANDED NEUTROPHILS ABSOLUTE COUNT: 0 10*3/uL (ref 0.0–0.1)
BASOPHILS ABSOLUTE COUNT: 0 10*3/uL (ref 0.0–0.2)
BASOPHILS RELATIVE PERCENT: 0 %
EOSINOPHILS ABSOLUTE COUNT: 0.2 10*3/uL (ref 0.0–0.4)
EOSINOPHILS RELATIVE PERCENT: 2 %
HEMATOCRIT: 39.5 % (ref 34.0–46.6)
HEMOGLOBIN: 13.1 g/dL (ref 11.1–15.9)
IMMATURE GRANULOCYTES: 0 %
LYMPHOCYTES RELATIVE PERCENT: 28 %
MEAN CORPUSCULAR HEMOGLOBIN CONC: 33.2 g/dL (ref 31.5–35.7)
MEAN CORPUSCULAR HEMOGLOBIN: 27.9 pg (ref 26.6–33.0)
MEAN CORPUSCULAR VOLUME: 84 fL (ref 79–97)
MONOCYTES RELATIVE PERCENT: 7 %
NEUTROPHILS ABSOLUTE COUNT: 5.6 10*3/uL (ref 1.4–7.0)
PLATELET COUNT: 231 10*3/uL (ref 150–379)
RED BLOOD CELL COUNT: 4.69 x10E6/uL (ref 3.77–5.28)
RED CELL DISTRIBUTION WIDTH: 15 % (ref 12.3–15.4)
WHITE BLOOD CELL COUNT: 9.1 10*3/uL (ref 3.4–10.8)

## 2017-12-01 LAB — BASOPHILS RELATIVE PERCENT: Lab: 0

## 2017-12-01 LAB — COMPREHENSIVE METABOLIC PANEL
A/G RATIO: 1.8 (ref 1.2–2.2)
ALBUMIN: 4.3 g/dL (ref 3.5–5.5)
ALKALINE PHOSPHATASE: 91 IU/L (ref 39–117)
ALT (SGPT): 38 IU/L — ABNORMAL HIGH (ref 0–32)
AST (SGOT): 22 IU/L (ref 0–40)
BILIRUBIN TOTAL: 0.4 mg/dL (ref 0.0–1.2)
BLOOD UREA NITROGEN: 21 mg/dL (ref 6–24)
BUN / CREAT RATIO: 23 (ref 9–23)
CHLORIDE: 107 mmol/L — ABNORMAL HIGH (ref 96–106)
CO2: 23 mmol/L (ref 20–29)
CREATININE: 0.93 mg/dL (ref 0.57–1.00)
GLOBULIN, TOTAL: 2.4 g/dL (ref 1.5–4.5)
POTASSIUM: 4.8 mmol/L (ref 3.5–5.2)
SODIUM: 143 mmol/L (ref 134–144)
TOTAL PROTEIN: 6.7 g/dL (ref 6.0–8.5)

## 2017-12-01 LAB — BILIRUBIN DIRECT: Lab: 0.1

## 2017-12-01 LAB — PHOSPHORUS, SERUM: Lab: 3.7

## 2017-12-01 LAB — MAGNESIUM: Lab: 1.8

## 2017-12-01 LAB — BUN / CREAT RATIO: Lab: 23

## 2017-12-01 LAB — GAMMA GLUTAMYL TRANSFERASE: Lab: 15

## 2017-12-01 NOTE — Unmapped (Signed)
[facility] Refill Coordination Note  Specialty Medication(s): Tacrolimus 1mg  & Mycophenolic 180mg     Evelyn Rollins, DOB: 16-Apr-1966  Phone: [phone] (home) , Alternate phone contact: N/A  Phone or address changes today?: No  All above HIPAA information was verified with patient.  Shipping Address: [address]  [place] [address]   Insurance changes? No    Completed refill call assessment today to schedule patient's medication shipment from the [facility] [phone]).      Confirmed the medication and dosage are correct and have not changed: Yes, regimen is correct and unchanged.    Confirmed patient started or stopped the following medications in the past month:  No, there are no changes reported at this time.    Are you tolerating your medication?:  Evelyn Rollins reports tolerating the medication.    ADHERENCE    (Below is required for Medicare Part B or Transplant patients only - per drug):   How many tablets were dispensed last month:     Tacrolimus 1 mg   Quantity filled last month: 120   # of tablets left on hand: 6 days    Mycophenolic Acid 180 mg   Quantity filled last month: 120   # of tablets left on hand: 6 days    Did you miss any doses in the past 4 weeks? No missed doses reported.    FINANCIAL/SHIPPING    Delivery Scheduled: Yes, Expected medication delivery date: 12/07/2017     Evelyn Rollins did not have any additional questions at this time.    Delivery address validated in FSI scheduling system: Yes, address listed in FSI is correct.    We will follow up with patient monthly for standard refill processing and delivery.      Thank you,  Tamala Fothergill   [facility] Shared [facility] Specialty Technician

## 2017-12-02 LAB — TACROLIMUS LEVEL: TACROLIMUS BLOOD: 4.5 ng/mL (ref 2.0–20.0)

## 2017-12-02 LAB — TACROLIMUS BLOOD: Lab: 4.5

## 2017-12-03 MED FILL — TACROLIMUS/1MG/CAPS: TACROLIMUS/1MG/CAPS | 30 days supply | Qty: 120 | Fill #3

## 2017-12-03 MED FILL — MYCOPHENOLIC ACID DR/180MG/TABS: MYCOPHENOLIC ACID DR/180MG/TABS | 30 days supply | Qty: 120 | Fill #3

## 2017-12-19 NOTE — Unmapped (Signed)
Standing lab orders for [facility] entered into EPIC.

## 2017-12-28 NOTE — Unmapped (Signed)
[facility] Pharm[facility] Refill Coordination Note  Specialty Medication(s): Tacrolimus 1mg  & Mycophenolic 180mg     [name], DOB: 07-Mar-1966  Phone: [phone] (home) , Alternate phone contact: N/A  Phone or address changes today?: No  All above HIPAA information was verified with patient.  Shipping Address: [address]  [place] [address]   Insurance changes? No    Completed refill call assessment today to schedule patient's medication shipment from the [facility] [phone]).      Confirmed the medication and dosage are correct and have not changed: Yes, regimen is correct and unchanged.    Confirmed patient started or stopped the following medications in the past month:  No, there are no changes reported at this time.    Are you tolerating your medication?:  Yoneko reports tolerating the medication.    ADHERENCE    (Below is required for Medicare Part B or Transplant patients only - per drug):   How many tablets were dispensed last month:     Tacrolimus 1 mg   Quantity filled last month: 120   # of tablets left on hand: 10 Days    Mycophenolic Acid 180 mg   Quantity filled last month: 120   # of tablets left on hand: 10 Days    Did you miss any doses in the past 4 weeks? No missed doses reported.    FINANCIAL/SHIPPING    Delivery Scheduled: Yes, Expected medication delivery date: 01/05/2018     Hiawatha did not have any additional questions at this time.    Delivery address validated in FSI scheduling system: Yes, address listed in FSI is correct.    We will follow up with patient monthly for standard refill processing and delivery.      Thank you,  Tamala Fothergill   [facility] Specialty Technician

## 2018-01-04 MED FILL — TACROLIMUS/1MG/CAPS: TACROLIMUS/1MG/CAPS | 30 days supply | Qty: 120 | Fill #4

## 2018-01-05 MED FILL — MYCOPHENOLIC ACID DR/180MG/TABS: MYCOPHENOLIC ACID DR/180MG/TABS | 30 days supply | Qty: 120 | Fill #4

## 2018-01-05 NOTE — Unmapped (Signed)
Per test claim for MYCOPHENOLIC ACID at the [facility], patient needs Medication Assistance Program for Prior Authorization.

## 2018-01-17 LAB — CBC W/ DIFFERENTIAL
BANDED NEUTROPHILS ABSOLUTE COUNT: 0 10*3/uL (ref 0.0–0.1)
BASOPHILS ABSOLUTE COUNT: 0 10*3/uL (ref 0.0–0.2)
BASOPHILS RELATIVE PERCENT: 0 %
EOSINOPHILS RELATIVE PERCENT: 3 %
HEMATOCRIT: 40.4 % (ref 34.0–46.6)
HEMOGLOBIN: 12.8 g/dL (ref 11.1–15.9)
IMMATURE GRANULOCYTES: 0 %
LYMPHOCYTES ABSOLUTE COUNT: 2.6 10*3/uL (ref 0.7–3.1)
LYMPHOCYTES RELATIVE PERCENT: 38 %
MEAN CORPUSCULAR HEMOGLOBIN CONC: 31.7 g/dL (ref 31.5–35.7)
MEAN CORPUSCULAR HEMOGLOBIN: 27.7 pg (ref 26.6–33.0)
MONOCYTES ABSOLUTE COUNT: 0.5 10*3/uL (ref 0.1–0.9)
MONOCYTES RELATIVE PERCENT: 7 %
NEUTROPHILS ABSOLUTE COUNT: 3.5 10*3/uL (ref 1.4–7.0)
NEUTROPHILS RELATIVE PERCENT: 52 %
PLATELET COUNT: 220 10*3/uL (ref 150–379)
RED BLOOD CELL COUNT: 4.62 x10E6/uL (ref 3.77–5.28)
WHITE BLOOD CELL COUNT: 6.8 10*3/uL (ref 3.4–10.8)

## 2018-01-17 LAB — COMPREHENSIVE METABOLIC PANEL
A/G RATIO: 1.7 (ref 1.2–2.2)
ALBUMIN: 4.2 g/dL (ref 3.5–5.5)
ALKALINE PHOSPHATASE: 93 IU/L (ref 39–117)
ALT (SGPT): 35 IU/L — ABNORMAL HIGH (ref 0–32)
AST (SGOT): 22 IU/L (ref 0–40)
BILIRUBIN TOTAL: 0.4 mg/dL (ref 0.0–1.2)
BLOOD UREA NITROGEN: 19 mg/dL (ref 6–24)
BUN / CREAT RATIO: 20 (ref 9–23)
CALCIUM: 9.4 mg/dL (ref 8.7–10.2)
CO2: 21 mmol/L (ref 20–29)
CREATININE: 0.96 mg/dL (ref 0.57–1.00)
GLUCOSE: 99 mg/dL (ref 65–99)
SODIUM: 140 mmol/L (ref 134–144)
TOTAL PROTEIN: 6.7 g/dL (ref 6.0–8.5)

## 2018-01-17 LAB — GAMMA GLUTAMYL TRANSFERASE: Lab: 18

## 2018-01-17 LAB — BILIRUBIN DIRECT: Lab: 0.12

## 2018-01-17 LAB — MONOCYTES RELATIVE PERCENT: Lab: 7

## 2018-01-17 LAB — MAGNESIUM: Lab: 2

## 2018-01-17 LAB — AST (SGOT): Lab: 22

## 2018-01-17 LAB — PHOSPHORUS, SERUM: Lab: 3.1

## 2018-01-18 LAB — TACROLIMUS BLOOD: Lab: 6.5

## 2018-02-02 NOTE — Unmapped (Signed)
[facility] Pharmacy Refill and Clinical Coordination Note  Medication(s): Mycophenolic acid and tacrolimus    Evelyn Rollins, DOB: 1966/05/05  Phone: [phone] (home) , Alternate phone contact: N/A  Shipping address: [address]  [place]  [address]  Phone or address changes today?: No  All above HIPAA information verified.  Insurance changes? No    Completed refill and clinical call assessment today to schedule patient's medication shipment from the [facility] [phone]).      MEDICATION RECONCILIATION    Confirmed the medication and dosage are correct and have not changed: Yes, regimen is correct and unchanged.    Were there any changes to your medication(s) in the past month:  No, there are no changes reported at this time.    ADHERENCE    Is this medicine transplant or covered by Medicare Part B? No.        Did you miss any doses in the past 4 weeks? No missed doses reported.  Adherence counseling provided? Not needed     SIDE EFFECT MANAGEMENT    Are you tolerating your medication?:  Evelyn Rollins reports tolerating the medication.  Side effect management discussed: None      Therapy is appropriate and should be continued.          FINANCIAL/SHIPPING    Delivery Scheduled: Yes, Expected medication delivery date: 02/06/18   Additional medications refilled: No additional medications/refills needed at this time.    The patient will receive an FSI print out for each medication shipped and additional FDA Medication Guides as required.  Patient education from [place] or Robet Leu may also be included in the shipment.    Deborah did not have any additional questions at this time.    Delivery address validated in FSI scheduling system: Yes, address listed above is correct.      We will follow up with patient monthly for standard refill processing and delivery.      Thank you,  Rollen Sox   [facility] Shared [facility] Specialty Pharmacist

## 2018-02-02 NOTE — Unmapped (Signed)
7

## 2018-02-04 MED FILL — MYCOPHENOLIC ACID DR/180MG/TABS: MYCOPHENOLIC ACID DR/180MG/TABS | 30 days supply | Qty: 120 | Fill #5

## 2018-02-04 MED FILL — TACROLIMUS/1MG/CAPS: TACROLIMUS/1MG/CAPS | 30 days supply | Qty: 120 | Fill #5

## 2018-02-22 NOTE — Unmapped (Signed)
[facility] Pharmacy Refill and Clinical Coordination Note  Medication(s): TACROLIMUS, MYCOPHENOLATE    Evelyn Rollins, DOB: [birth year]  Phone: [phone] (home) , Alternate phone contact: N/A  Shipping address: [address]  [place] [address]  Phone or address changes today?: No  All above HIPAA information verified.  Insurance changes? No    Completed refill and clinical call assessment today to schedule patient's medication shipment from the [facility] [phone]).      MEDICATION RECONCILIATION    Confirmed the medication and dosage are correct and have not changed: Yes, regimen is correct and unchanged.    Were there any changes to your medication(s) in the past month:  No, there are no changes reported at this time.    ADHERENCE    Is this medicine transplant or covered by Medicare Part B? Yes.    Tacrolimus 1 mg   Quantity filled last month: 120   # of tablets left on hand: 13 DAYS LEFT  Mycophenolic Acid 180 mg   Quantity filled last month: 120   # of tablets left on hand: 13 DAYS LEFT    Did you miss any doses in the past 4 weeks? Yes.  Evelyn Rollins reports missing 2 DOSES days of medication therapy in the last 4 weeks.  Evelyn Rollins reports father passed away, mind was elsewhere as the cause of their non-adherance.  Adherence counseling provided? Yes, discussed the following ways to help with adherence: pillbox, alarm.     SIDE EFFECT MANAGEMENT    Are you tolerating your medication?:  Evelyn Rollins reports tolerating the medication.  Side effect management discussed: None      Therapy is appropriate and should be continued.    Evidence of clinical benefit: See Epic note from 07/03/17      FINANCIAL/SHIPPING    Delivery Scheduled: Yes, Expected medication delivery date: 03/02/18   Additional medications refilled: No additional medications/refills needed at this time.    The patient will receive an FSI print out for each medication shipped and additional FDA Medication Guides as required. Patient education from [place] or Robet Leu may also be included in the shipment.    Evelyn Rollins did not have any additional questions at this time.    Delivery address validated in FSI scheduling system: Yes, address listed above is correct.      We will follow up with patient monthly for standard refill processing and delivery.      Thank you,  Thad Ranger   [facility] Shared [facility] Specialty Pharmacist

## 2018-03-01 MED FILL — TACROLIMUS/1MG/CAPS: TACROLIMUS/1MG/CAPS | 30 days supply | Qty: 120 | Fill #6

## 2018-03-01 MED FILL — MYCOPHENOLIC ACID DR/180MG DR/TBEC: MYCOPHENOLIC ACID DR/180MG DR/TBEC | 30 days supply | Qty: 120 | Fill #6

## 2018-03-11 LAB — BILIRUBIN DIRECT: Lab: 0.1

## 2018-03-11 LAB — CBC W/ DIFFERENTIAL
BANDED NEUTROPHILS ABSOLUTE COUNT: 0 10*3/uL (ref 0.0–0.1)
BASOPHILS RELATIVE PERCENT: 1 %
EOSINOPHILS ABSOLUTE COUNT: 0.2 10*3/uL (ref 0.0–0.4)
EOSINOPHILS RELATIVE PERCENT: 2 %
HEMATOCRIT: 39.5 % (ref 34.0–46.6)
HEMOGLOBIN: 12.7 g/dL (ref 11.1–15.9)
IMMATURE GRANULOCYTES: 1 %
LYMPHOCYTES ABSOLUTE COUNT: 2.6 10*3/uL (ref 0.7–3.1)
LYMPHOCYTES RELATIVE PERCENT: 40 %
MEAN CORPUSCULAR HEMOGLOBIN CONC: 32.2 g/dL (ref 31.5–35.7)
MEAN CORPUSCULAR HEMOGLOBIN: 27.6 pg (ref 26.6–33.0)
MEAN CORPUSCULAR VOLUME: 86 fL (ref 79–97)
MONOCYTES ABSOLUTE COUNT: 0.4 10*3/uL (ref 0.1–0.9)
MONOCYTES RELATIVE PERCENT: 7 %
NEUTROPHILS ABSOLUTE COUNT: 3.3 10*3/uL (ref 1.4–7.0)
PLATELET COUNT: 218 10*3/uL (ref 150–379)
RED BLOOD CELL COUNT: 4.6 x10E6/uL (ref 3.77–5.28)
WHITE BLOOD CELL COUNT: 6.5 10*3/uL (ref 3.4–10.8)

## 2018-03-11 LAB — COMPREHENSIVE METABOLIC PANEL
ALKALINE PHOSPHATASE: 87 IU/L (ref 39–117)
ALT (SGPT): 40 IU/L — ABNORMAL HIGH (ref 0–32)
AST (SGOT): 28 IU/L (ref 0–40)
BILIRUBIN TOTAL: 0.3 mg/dL (ref 0.0–1.2)
BLOOD UREA NITROGEN: 22 mg/dL (ref 6–24)
BUN / CREAT RATIO: 20 (ref 9–23)
CALCIUM: 9.4 mg/dL (ref 8.7–10.2)
CHLORIDE: 108 mmol/L — ABNORMAL HIGH (ref 96–106)
CREATININE: 1.08 mg/dL — ABNORMAL HIGH (ref 0.57–1.00)
GLOBULIN, TOTAL: 2.3 g/dL (ref 1.5–4.5)
GLUCOSE: 98 mg/dL (ref 65–99)
POTASSIUM: 5.2 mmol/L (ref 3.5–5.2)
SODIUM: 143 mmol/L (ref 134–144)
TOTAL PROTEIN: 6.3 g/dL (ref 6.0–8.5)

## 2018-03-11 LAB — PHOSPHORUS, SERUM: Lab: 3.3

## 2018-03-11 LAB — GLOBULIN, TOTAL: Lab: 2.3

## 2018-03-11 LAB — MAGNESIUM: Lab: 1.8

## 2018-03-11 LAB — BASOPHILS ABSOLUTE COUNT: Lab: 0

## 2018-03-11 LAB — GAMMA GLUTAMYL TRANSFERASE: Lab: 14

## 2018-03-13 LAB — TACROLIMUS BLOOD: Lab: 5.6

## 2018-03-17 NOTE — Unmapped (Signed)
Called patient back. She was concerned about her ALT.    Pt's dad passed away and went to ED with heart condition. CPR done and brought back and went to rehab and doing well. His dad's dog passed away 3 days before the pt's dad. Then her dad passed away at the end of 01/14/23.     She mentioned she was not sticking to her diet during this. She is only child and trying to help her mom who lives 45 minutes away.     Reviewed 03/10/18 labs with her letting her know that her labs are stable. Mentioned her K+ was slightly up but not treatable.     Patient has hernia repair appointment in the near future. Encouraged her to continue exercising and eating healthy since she has worked so hard to improve her liver (fat within liver) and to be able to have her hernia repaired. Discussed trying to have her mom walk with her.

## 2018-03-21 ENCOUNTER — Ambulatory Visit: Admit: 2018-03-21 | Discharge: 2018-03-22 | Payer: MEDICARE | Attending: Obesity Medicine | Primary: Obesity Medicine

## 2018-03-21 DIAGNOSIS — K439 Ventral hernia without obstruction or gangrene: Secondary | ICD-10-CM

## 2018-03-21 DIAGNOSIS — Z6841 Body Mass Index (BMI) 40.0 and over, adult: Secondary | ICD-10-CM

## 2018-03-21 DIAGNOSIS — Z944 Liver transplant status: Principal | ICD-10-CM

## 2018-03-21 MED ORDER — METFORMIN ER 500 MG TABLET,EXTENDED RELEASE 24 HR
ORAL_TABLET | Freq: Every day | ORAL | 3 refills | 0.00000 days | Status: CP
Start: 2018-03-21 — End: 2018-03-21

## 2018-03-21 MED ORDER — METFORMIN ER 500 MG TABLET,EXTENDED RELEASE 24 HR: 500 mg | tablet | Freq: Every day | 3 refills | 0 days | Status: AC

## 2018-03-21 NOTE — Unmapped (Signed)
Summary at initial presentation  Evelyn Rollins is a 52 y.o. female and Body mass index is 44.74 kg/m??..  She presents to the [facility] interested in lifestyle , pharmacologic and surgical treatments for her obesity with her motivation for weight loss being improvement in health of her transplanted liver as well as to be able to undergo abdominal reconstruction surgery for which she needs 20 pound weight loss..The causes of her obesity are multifactorial and include strong genetic predisposition , metabolic effects of consumption of processed food, irregular eating patterns causing circadian disruption, inadequate sleep duration, poor sleep quality, increased life stress and weight gain promoting medication Lexapro .  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 She has no disordered eating patterns which may be barriers to a good treatment response. Clearly her overall quality of life is compromised by her weight and she is motivated to be a part of our medical weight program.    Treatment course  Starting weight at presentation:236.6 lb  Weight today: 227 lbs  Weight loss since last visit: 8 lbs weight gain.  Total weight loss since presentation:10 lbs  Percent weight loss since presentation:3.4 %  Percent weight loss with low carb diet : 3.4 %        Previous use of anti-obesity medications:  None    Narrative history  ????No c/o   ??  Diet:  Has been drinking a protein shake in the morning. Has chicken strips for lunch, popcorn for a snack. Has not been eating much fruits or  vegetables.   ??  ??Exercise: Was doing zumba 1 hr once a week. Will start back.  ????  Sleep: Had a sleep study ,was told that she does not have OSA. Getting 5-6 hrs sleep and waking up multiple times at night with mind racing.  ????  Stress: 7/10  ??????  Medications: No medication changes.      Patient Active Problem List Diagnosis Date Noted   ??? Ventral incisional hernia 09/21/2017   ??? Morbid obesity with BMI of 40.0-44.9, adult (CMS-HCC) 09/21/2017   ??? 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 Bile leak, postoperative 07/06/2015   ??? Liver transplanted (CMS-HCC) 07/05/2015   ??? Hyperkalemia 06/17/2015   ??? Acute kidney injury (CMS-HCC) 06/17/2015   ??? Hyponatremia 06/16/2015   ??? Fatigue 06/16/2015   ??? Anxiety 06/16/2015   ??? Shortness of breath 03/20/2015   ??? Thrombocytopenia (CMS-HCC) 03/20/2015   ??? Ascites 03/19/2015   ??? Depression 02/10/2015       Current Outpatient Medications on File Prior to Visit   Medication Sig Dispense Refill   ??? acetaminophen (TYLENOL) 325 MG tablet Take 650 mg by mouth daily as needed.     ??? amLODIPine (NORVASC) 5 MG tablet Take 5 mg by mouth daily.     ??? aspirin (ECOTRIN) 81 MG tablet Take 1 tablet (81 mg total) by mouth daily. 30 tablet 11   ??? cholecalciferol, vitamin D3, (VITAMIN D3) 2,000 unit cap Take 1 capsule by mouth daily at 0600.     ??? escitalopram oxalate (LEXAPRO) 10 MG tablet Take 10 mg by mouth daily.      ??? levETIRAcetam (KEPPRA) 250 MG tablet Take 1 tablet (250 mg total) by mouth Two (2) times a day. 120 tablet 0   ??? levothyroxine (SYNTHROID, LEVOTHROID) 112 MCG tablet TAKE ONE TABLET BY MOUTH ONCE  DAILY     ??? levothyroxine (SYNTHROID, LEVOTHROID) 125 MCG tablet Take 1 tablet (125 mcg total) by mouth daily at 0600. 30 tablet 3   ??? LORazepam (ATIVAN) 0.5 MG tablet Take 1 mg by mouth two (2) times a day as needed for anxiety.      ??? magnesium oxide (MAG-OX) 400 mg tablet Take 400 mg by mouth.     ??? mycophenolate (MYFORTIC) 180 MG EC tablet Take 2 tablets (360 mg total) by mouth Two (2) times a day. (Patient taking differently: Take 360 mg by mouth Two (2) times a day. Started generic on Nov 20.) 360 tablet 3   ??? pravastatin (PRAVACHOL) 40 MG tablet Take 40 mg by mouth daily. PCP started and managing     ??? tacrolimus (PROGRAF) 1 MG capsule Take 2 capsules (2 mg total) by mouth Two (2) times a day. (Patient taking differently: Take 2 mg by mouth Two (2) times a day. Started on generic on Nov 20.) 360 capsule 3   ??? triamcinolone (KENALOG) 0.5 % ointment Apply 1 application topically daily as needed.        No current facility-administered medications on file prior to visit.        Blood pressure 133/68, pulse 79, height 157.5 cm (5' 2.01), weight (!) 103 kg (227 lb), not currently breastfeeding.  Body mass index is 41.51 kg/m??.      Exam:  AAO x3  Chest: clear to auscultation  Abdomen: NAD  Ext: No pedal edema      Plan on 05/01/2017  Based on the possible etiologies for the patients obesity , my recommendations include the following :  ????????Diet quality and patterning: Based on her 24-hour dietary recall, patient tends to eat processed foods frequently and has a high intake of carbohydrates including processed carbohydrates. Based on increasing liver enzymes and a possibility of recurrence of fatty liver disease, she may benefit from low carbohydrate diet. I have discussed the principles of the diet, but she will return to follow-up with the medical weight program dietitian for details.  ??  ????????Exercise:????I reviewed the importance of exercise for ongoing weight loss and long term weight maintenance with the patient.??  ??      Sleep: Patient has poor sleep hygiene, says that she has difficulty falling asleep even though she is in bed and drinks 16 ounces of water before going to bed which makes her wake up 3 times at night to use the bathroom. We talked about reducing fluid intake 2 hours before bedtime. I also directed her to the website to help improve her sleep hygiene. She will start melatonin 5 mg at night 1 hour before bedtime regularly. Patient has a STOP-BA NG score of 6 out of 8. She will discuss with her primary care physician regarding getting a sleep study closer to home.  ??  ???? Stress management: We talked about strategies for stress management including meditation, yoga, prayer and going to church.  ????   ???? Weight gain causing medications: Patient is on Lexapro which could contribute to her weight gain. Other options such as fluoxetine and sertraline may be better from the weight gain perspective.  ??  ???? Anti Obesity Medications: At this time I would avoid using any anti-obesity medications as her liver enzymes are increasing. After we are able to stabilize the liver enzymes, and a trial of lifestyle changes including a low carbohydrate diet, we would consider adding anti-obesity medications. Anti-obesity medications that may be appropriate for  patient include metformin, topiramate. I would not use Wellbutrin or phentermine due to history of seizures. Liraglutide may be tried but it is unlikely that her insurance would cover the prescription.  ??  ??  Obesity Surgery: Obesity surgery is an appropriate choice for the patient due to her multiple comorbidities and a BMI greater than 44. She is however unwilling to consider surgery at this time but willing to revisit the idea if medical therapies do not work.    Plan on 07/03/2017  Has been following the low carb diet. Doing well with consistent weight loss.  Continue with low carb diet and follow up with me in 2 months.    Plan on 09/04/2017  Doing well with the low carb diet.  No need for medications at this time.   F/u with me in 2 months.    Plan on 03/21/2018  Patient informed about the [facility] healthy plate. Start metformin XR 500 mg daily.  Will start back on Zumba for PA.    Counselling  Total visit duration 25 min. >50% of the visit was used to counsel patient.

## 2018-03-30 NOTE — Unmapped (Signed)
[facility] Pharmacy Refill Coordination Note    Specialty Medication(s) to be Shipped:   Mycophenolic 180mg  & Tacrolimus 1mg      [name], DOB: Oct 02, 1966  Phone: [phone] (home)   Shipping Address: [address]  [place] [address]    All above HIPAA information was verified with patient.     Completed refill call assessment today to schedule patient's medication shipment from the [facility] [phone]).       Specialty medication(s) and dose(s) confirmed: Regimen is correct and unchanged.   Changes to medications: Brynlynn reports no changes reported at this time.  Changes to insurance: No  Questions for the pharmacist: No    The patient will receive an FSI print out for each medication shipped and additional FDA Medication Guides as required.  Patient education from [place] or Robet Leu may also be included in the shipment.    DISEASE-SPECIFIC INFORMATION        N/A    ADHERENCE     Medication Adherence    Patient reported X missed doses in the last month:  2     Patient forgot to get her medications (Mycophenolic 180mg  & Tacrolimus 1mg )      MEDICARE PART B DOCUMENTATION     Mycophenolic acid 180mg : 120 tablets filled last month, patient has 6 day worth of  tablets on hand.   Tacrolimus 1mg : 120 capsules filled last moth, patient has 6 days worth of capsules on hand.    SHIPPING     Shipping address confirmed in FSI.     Delivery Scheduled: Yes, Expected medication delivery date: 04/04/2018 via UPS or courier.     Trigo Winterbottom Leodis Binet   [facility] Shared [facility] Specialty Technician

## 2018-04-03 ENCOUNTER — Ambulatory Visit (INDEPENDENT_AMBULATORY_CARE_PROVIDER_SITE_OTHER): Payer: Medicare Other | Admitting: Internal Medicine

## 2018-04-03 ENCOUNTER — Encounter (INDEPENDENT_AMBULATORY_CARE_PROVIDER_SITE_OTHER): Payer: Self-pay | Admitting: Internal Medicine

## 2018-04-03 VITALS — BP 124/90 | HR 72 | Temp 98.2°F | Resp 18 | Ht 62.0 in | Wt 228.6 lb

## 2018-04-03 DIAGNOSIS — K760 Fatty (change of) liver, not elsewhere classified: Secondary | ICD-10-CM | POA: Diagnosis not present

## 2018-04-03 DIAGNOSIS — Z944 Liver transplant status: Secondary | ICD-10-CM | POA: Diagnosis not present

## 2018-04-03 MED FILL — MYCOPHENOLIC ACID DR/180MG DR/TBEC: MYCOPHENOLIC ACID DR/180MG DR/TBEC | 30 days supply | Qty: 120 | Fill #7

## 2018-04-03 MED FILL — TACROLIMUS/1MG/CAPS: TACROLIMUS/1MG/CAPS | 30 days supply | Qty: 120 | Fill #7

## 2018-04-03 NOTE — Progress Notes (Signed)
Presenting complaint;  History of liver transplant. Yearly follow-up.  Database and subjective:  Cynthia Stephenson is a 52 year old Caucasian female who is here for yearly visit.  She has a history of cirrhosis secondary to [place] with a rapid deterioration.  She underwent liver transplant in July 2016.  She is getting periodic blood work via [facility].  She says 1 of her liver enzymes have been elevated and she is trying to lose weight. On her last visit she was noted to have incisional hernia.  She says it has gotten very large and bothersome to her.  She saw Dr. Claud KelpHaywood Ingram last year but he felt Cynthia Stephenson should [facility] given history of liver issues.  She has seen Dr. Paulla ForeAriel Perez and is planning to have surgery in the near future.  She also has been to clinic for weight loss.  She is trying her best to lose weight.  She states she was doing Zumba at least once a week until her father got sick and passed away in January this year.  She is trying to get back to routine.  She has lost 12 pounds since her last visit to our office 1 year ago.  She remains with good appetite.  She denies nausea vomiting heartburn dysphagia melena or rectal bleeding.  Her bowels move daily.  She does complain of right flank pain which occurs intermittently and is not severe.  She also has back pain.  She is also interested in having screening colonoscopy once she has recovered from hernia repair.    Current Medications: Outpatient Encounter Medications as of 04/03/2018  Medication Sig  . acetaminophen (TYLENOL) 325 MG tablet Take by mouth. As needed  . amLODipine (NORVASC) 5 MG tablet Take 5 mg by mouth daily.  Marland Kitchen. aspirin EC 81 MG tablet Take 81 mg by mouth daily.  . Cholecalciferol (VITAMIN D3) 2000 UNITS capsule Take 2,000 Units by mouth.  . levETIRAcetam (KEPPRA) 250 MG tablet Take 1 tablet (250 mg total) by mouth 2 (two) times daily.  Marland Kitchen. levothyroxine (SYNTHROID, LEVOTHROID) 137 MCG tablet TAKE  ONE TABLET BY MOUTH ONCE DAILY  . LORazepam (ATIVAN) 0.5 MG tablet Take 0.5 mg by mouth 2 (two) times daily. Take up to two a day as needed  . magnesium oxide (MAG-OX) 400 MG tablet Take 400 mg by mouth daily.  . metFORMIN (GLUCOPHAGE-XR) 500 MG 24 hr tablet Take 250 mg by mouth every evening. Cynthia Stephenson states that she takes at 7 pm.  . mycophenolate (MYFORTIC) 180 MG EC tablet Take 180 mg by mouth. Cynthia Stephenson states that she takes 2 in the morning and 2 at night.  . pravastatin (PRAVACHOL) 10 MG tablet Take 10 mg by mouth daily.  . tacrolimus (PROGRAF) 1 MG capsule Take 5 mg by mouth. Cynthia Stephenson states that she is taking 3 in the morning and 2 at night.  Marland Kitchen. amLODipine (NORVASC) 5 MG tablet Take 5 mg by mouth daily.    No facility-administered encounter medications on file as of 04/03/2018.      Objective: Blood pressure 124/90, pulse 72, temperature 98.2 F (36.8 C), temperature source Oral, resp. rate 18, height 5\' 2"  (1.575 m), weight 228 lb 9.6 oz (103.7 kg). Cynthia Stephenson is alert and in no acute distress. She does not have asterixis. Conjunctiva is pink. Sclera is nonicteric Oropharyngeal mucosa is normal. No neck masses or thyromegaly noted. Cardiac exam with regular rhythm normal S1 and S2. No murmur or gallop noted. Lungs are clear  to auscultation. Abdomen abdomen is full.  She has large incisional hernia in upper abdomen.  It is soft and partially reducible.  No organomegaly or masses. No LE edema or clubbing noted.  Labs/studies Results: Lab data from 03/10/2018([facility]). WBC 6.5, H&H 12.7 and 39.5 and platelet count 218K.  Serum sodium 143, potassium 5.2, chloride 108, CO2 21, BUN 22, creatinine 1.08  Bilirubin 0.3, AP 87, AST 28, ALT 40, total protein 6.3 and albumin 4.0.  Serum calcium 9.4.   Tacrolimus level 5.6.  Assessment:  #1.  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  She has mildly elevated ALT felt to be most likely due to fatty liver.  She she must modify  her lifestyle and do regular exercise so that she would not end up with recurrent disease in transplanted liver. She will continue to get periodic labs by [facility].  #2.  Incisional hernia.  It is quite large and bothersome to her.  She is to have hernia repaired in [facility] in near future.  #3.  Cynthia Stephenson is average risk for CRC.  She is interested in colonoscopy which will be scheduled once she has recovered from hernia repair.   Plan:  Screening colonoscopy to be scheduled later this year when she has recovered from hernia repair. She will continue her blood work via [facility]. She will return for office visit in 1 year.

## 2018-04-03 NOTE — Patient Instructions (Signed)
Will schedule screening colonoscopy following hernia surgery.

## 2018-04-04 LAB — CBC W/ DIFFERENTIAL
BANDED NEUTROPHILS ABSOLUTE COUNT: 0 10*3/uL (ref 0.0–0.1)
BASOPHILS ABSOLUTE COUNT: 0 10*3/uL (ref 0.0–0.2)
EOSINOPHILS ABSOLUTE COUNT: 0.2 10*3/uL (ref 0.0–0.4)
EOSINOPHILS RELATIVE PERCENT: 3 %
HEMATOCRIT: 39.2 % (ref 34.0–46.6)
HEMOGLOBIN: 13.1 g/dL (ref 11.1–15.9)
IMMATURE GRANULOCYTES: 1 %
LYMPHOCYTES ABSOLUTE COUNT: 2.5 10*3/uL (ref 0.7–3.1)
LYMPHOCYTES RELATIVE PERCENT: 36 %
MEAN CORPUSCULAR HEMOGLOBIN CONC: 33.4 g/dL (ref 31.5–35.7)
MEAN CORPUSCULAR HEMOGLOBIN: 28.1 pg (ref 26.6–33.0)
MEAN CORPUSCULAR VOLUME: 84 fL (ref 79–97)
MONOCYTES ABSOLUTE COUNT: 0.5 10*3/uL (ref 0.1–0.9)
MONOCYTES RELATIVE PERCENT: 7 %
NEUTROPHILS ABSOLUTE COUNT: 3.6 10*3/uL (ref 1.4–7.0)
NEUTROPHILS RELATIVE PERCENT: 53 %
PLATELET COUNT: 231 10*3/uL (ref 150–379)
RED BLOOD CELL COUNT: 4.66 x10E6/uL (ref 3.77–5.28)
RED CELL DISTRIBUTION WIDTH: 15.3 % (ref 12.3–15.4)

## 2018-04-04 LAB — PHOSPHORUS, SERUM: Lab: 3

## 2018-04-04 LAB — COMPREHENSIVE METABOLIC PANEL
ALBUMIN: 4.3 g/dL (ref 3.5–5.5)
ALKALINE PHOSPHATASE: 90 IU/L (ref 39–117)
ALT (SGPT): 38 IU/L — ABNORMAL HIGH (ref 0–32)
AST (SGOT): 22 IU/L (ref 0–40)
BLOOD UREA NITROGEN: 18 mg/dL (ref 6–24)
BUN / CREAT RATIO: 19 (ref 9–23)
CALCIUM: 9.4 mg/dL (ref 8.7–10.2)
CHLORIDE: 107 mmol/L — ABNORMAL HIGH (ref 96–106)
CO2: 21 mmol/L (ref 20–29)
CREATININE: 0.93 mg/dL (ref 0.57–1.00)
GLOBULIN, TOTAL: 2.3 g/dL (ref 1.5–4.5)
POTASSIUM: 5.1 mmol/L (ref 3.5–5.2)
SODIUM: 143 mmol/L (ref 134–144)
TOTAL PROTEIN: 6.6 g/dL (ref 6.0–8.5)

## 2018-04-04 LAB — MAGNESIUM: Lab: 1.8

## 2018-04-04 LAB — CALCIUM: Lab: 9.4

## 2018-04-04 LAB — GAMMA GLUTAMYL TRANSFERASE: Lab: 16

## 2018-04-04 LAB — WHITE BLOOD CELL COUNT: Lab: 6.7

## 2018-04-04 LAB — BILIRUBIN DIRECT: Lab: 0.1

## 2018-04-05 LAB — TACROLIMUS BLOOD: Lab: 5.6

## 2018-04-15 LAB — CBC W/ DIFFERENTIAL
BANDED NEUTROPHILS ABSOLUTE COUNT: 0 10*3/uL (ref 0.0–0.1)
BASOPHILS RELATIVE PERCENT: 1 %
EOSINOPHILS ABSOLUTE COUNT: 0.2 10*3/uL (ref 0.0–0.4)
EOSINOPHILS RELATIVE PERCENT: 3 %
HEMATOCRIT: 39.9 % (ref 34.0–46.6)
HEMOGLOBIN: 12.6 g/dL (ref 11.1–15.9)
IMMATURE GRANULOCYTES: 1 %
LYMPHOCYTES ABSOLUTE COUNT: 2.5 10*3/uL (ref 0.7–3.1)
LYMPHOCYTES RELATIVE PERCENT: 42 %
MEAN CORPUSCULAR HEMOGLOBIN CONC: 31.6 g/dL (ref 31.5–35.7)
MEAN CORPUSCULAR HEMOGLOBIN: 27.6 pg (ref 26.6–33.0)
MEAN CORPUSCULAR VOLUME: 88 fL (ref 79–97)
MONOCYTES ABSOLUTE COUNT: 0.6 10*3/uL (ref 0.1–0.9)
MONOCYTES RELATIVE PERCENT: 9 %
NEUTROPHILS ABSOLUTE COUNT: 2.6 10*3/uL (ref 1.4–7.0)
NEUTROPHILS RELATIVE PERCENT: 44 %
RED BLOOD CELL COUNT: 4.56 x10E6/uL (ref 3.77–5.28)
RED CELL DISTRIBUTION WIDTH: 14.8 % (ref 12.3–15.4)

## 2018-04-15 LAB — COMPREHENSIVE METABOLIC PANEL
A/G RATIO: 1.7 (ref 1.2–2.2)
ALBUMIN: 4 g/dL (ref 3.5–5.5)
ALKALINE PHOSPHATASE: 87 IU/L (ref 39–117)
AST (SGOT): 22 IU/L (ref 0–40)
BILIRUBIN TOTAL: 0.3 mg/dL (ref 0.0–1.2)
BLOOD UREA NITROGEN: 24 mg/dL (ref 6–24)
BUN / CREAT RATIO: 26 — ABNORMAL HIGH (ref 9–23)
CALCIUM: 9.1 mg/dL (ref 8.7–10.2)
CHLORIDE: 107 mmol/L — ABNORMAL HIGH (ref 96–106)
CO2: 19 mmol/L — ABNORMAL LOW (ref 20–29)
CREATININE: 0.92 mg/dL (ref 0.57–1.00)
GLUCOSE: 103 mg/dL — ABNORMAL HIGH (ref 65–99)
POTASSIUM: 4.6 mmol/L (ref 3.5–5.2)
SODIUM: 142 mmol/L (ref 134–144)
TOTAL PROTEIN: 6.4 g/dL (ref 6.0–8.5)

## 2018-04-15 LAB — AST (SGOT): Lab: 22

## 2018-04-15 LAB — PHOSPHORUS, SERUM: Lab: 3.3

## 2018-04-15 LAB — GAMMA GLUTAMYL TRANSFERASE: Lab: 17

## 2018-04-15 LAB — LYMPHOCYTES RELATIVE PERCENT: Lab: 42

## 2018-04-15 LAB — MAGNESIUM: Lab: 1.8

## 2018-04-15 LAB — GAMMA GT: GAMMA GLUTAMYL TRANSFERASE: 17 IU/L (ref 0–60)

## 2018-04-15 LAB — BILIRUBIN DIRECT: Lab: 0.09

## 2018-04-17 LAB — TACROLIMUS BLOOD: Lab: 6.4

## 2018-04-24 NOTE — Unmapped (Signed)
[facility] Pharmacy Refill Coordination Note  Specialty Medication(s): Mycophenolate 180 mg, Tacrolimus 1 mg  Additional Medications shipped: None    Evelyn Rollins, DOB: [birth year]  Phone: [phone] (home) , Alternate phone contact: N/A  Phone or address changes today?: No  All above HIPAA information was verified with patient.  Shipping Address: [address]  [place] [address]   Insurance changes? No    Completed refill call assessment today to schedule patient's medication shipment from the [facility] [phone]).      Confirmed the medication and dosage are correct and have not changed: Yes, regimen is correct and unchanged.    Confirmed patient started or stopped the following medications in the past month:  No, there are no changes reported at this time.    Are you tolerating your medication?:  Evelyn Rollins reports tolerating the medication.    ADHERENCE    (Below is required for Medicare Part B or Transplant patients only - per drug):   Mycophenolate 180 mg  How many tablets were dispensed last month: 120  Patient currently has 5 DAYS remaining.    Tacrolimus 1 mg   Quantity filled last month: 120   # of tablets left on hand: 5 DAYS      Did you miss any doses in the past 4 weeks? No missed doses reported.    FINANCIAL/SHIPPING    Delivery Scheduled: Yes, Expected medication delivery date: 04/26/18     The patient will receive an FSI print out for each medication shipped and additional FDA Medication Guides as required.  Patient education from [facility] or Robet Leu may also be included in the shipment    Lester did not have any additional questions at this time.    Delivery address validated in FSI scheduling system: Yes, address listed in FSI is correct.    We will follow up with patient monthly for standard refill processing and delivery.      Thank you,  Burnett Corrente   [facility] Specialty PharmD Candidate

## 2018-04-25 NOTE — Unmapped (Signed)
Return Visit Exam   52 y.o.??female??PMHx notable for cirrhosis, liver disease, obesity, sx notable for with liver transplant,C-Section x2, ERCP ??with a reducible, minimally symptomatic, nonobstructive ventral incisional hernia. She is up to date on her screening tests including colonoscopy and mammography. She would benefit from eventual hernia repair but given minimal symptoms, this is non-urgent.  Pt BMI down to 40.14- but has regained weight per Dr. Luciano Cutter last app 4/10. Not interested in weight loss surgery.    02/2017 CT A/P images from [facility] in Epic   Last seen in October 2018

## 2018-04-26 ENCOUNTER — Ambulatory Visit: Admit: 2018-04-26 | Discharge: 2018-04-27 | Payer: MEDICARE

## 2018-04-26 DIAGNOSIS — Z6841 Body Mass Index (BMI) 40.0 and over, adult: Secondary | ICD-10-CM

## 2018-04-26 DIAGNOSIS — K432 Incisional hernia without obstruction or gangrene: Secondary | ICD-10-CM

## 2018-04-26 MED FILL — TACROLIMUS/1MG/CAPS: TACROLIMUS/1MG/CAPS | 30 days supply | Qty: 120 | Fill #8

## 2018-04-26 MED FILL — MYCOPHENOLIC ACID DR/180MG DR/TBEC: MYCOPHENOLIC ACID DR/180MG DR/TBEC | 30 days supply | Qty: 120 | Fill #8

## 2018-04-26 NOTE — Unmapped (Signed)
[facility] HERNIA CENTER FOLLOW-UP APPOINTMENT  04/26/2018    Patient: Evelyn Rollins  DOB: 10-Mar-1966  Previously seen in clinic on: 09/21/2017  Primary Care Provider:  Rebecka Apley, NP      Subjective:    No obstructive symptoms.  No changes except for has gone up in weight - she has been taking care of her sick father.  Was going well with Dr. Conception Chancy, but it is a far drive.        BP 137/80  - Pulse 84  - Temp 36.7 ??C (98 ??F) (Oral)  - Ht 157.5 cm (5' 2.01)  - Wt (!) 105 kg (231 lb 6.4 oz)  - SpO2 95%  - BMI 42.31 kg/m??       Wt Readings from Last 12 Encounters:   04/26/18 (!) 105 kg (231 lb 6.4 oz)   03/21/18 (!) 103 kg (227 lb)   09/21/17 (!) 100.9 kg (222 lb 6.4 oz)   09/04/17 99.6 kg (219 lb 8 oz)   07/03/17 (!) 103 kg (227 lb)   07/03/17 (!) 102.6 kg (226 lb 1.6 oz)   05/31/17 (!) 104.6 kg (230 lb 9.6 oz)   05/17/17 (!) 106.6 kg (235 lb 1.6 oz)   05/01/17 (!) 107.4 kg (236 lb 12.8 oz)   04/13/17 (!) 108.9 kg (240 lb)   06/30/16 (!) 103.2 kg (227 lb 8 oz)   01/11/16 95.8 kg (211 lb 1.6 oz)       Physical Exam:  Abd soft, obese          Assessment & Plan:    52 y.o.??female??with h/o liver transplant and a body mass index 42.31 kg/m????with a reducible, minimally symptomatic, nonobstructive ventral incisional hernia.  Pt has gained weight since last seen - has been dealing with sick father.    Continue weight loss efforts        ? Continue to abstain from smoking.  Smoking increases the possibility of complications after surgery.  Abstain from use of tobacco products (including cigarettes, cigars, chewing tobacco, pipe tobacco, and e-cigarettes) - it can increase your risk of hernia recurrence, wound infection, poor wound healing, and other perioperative complications  ? Weight plays a role in surgical recovery and outcomes.  We did not set a weight loss goal of any pounds by the next clinic visit.  We did discuss that excess weight can increase risk of hernia recurrence, wound infection, poor wound healing, and other perioperative complications  ? If urgent/emergent surgery is required before scheduled elective surgery, I will make every effort to be available for the hernia repair  ? Wear an abdominal binder or compression garment for comfort  ? Return to clinic in 3 months for evaluation of goals and assessment and monitoring of symptoms      Travian Kerner Nathanial Rancher, MD, MPH

## 2018-04-26 NOTE — Unmapped (Signed)
You were seen at the [facility] by Dr. Thomes Cake for evaluation of hernia.    ? Continue to abstain from smoking.  Smoking increases the possibility of complications after surgery.  Abstain from use of tobacco products (including cigarettes, cigars, chewing tobacco, pipe tobacco, and e-cigarettes) - it can increase your risk of hernia recurrence, wound infection, poor wound healing, and other perioperative complications  ? Weight plays a role in surgical recovery and outcomes.  We did not set a weight loss goal of any pounds by the next clinic visit.  We did discuss that excess weight can increase risk of hernia recurrence, wound infection, poor wound healing, and other perioperative complications  ? If urgent/emergent surgery is required before scheduled elective surgery, I will make every effort to be available for the hernia repair  ? Wear an abdominal binder or compression garment for comfort  ? Return to clinic in 3 months for evaluation of goals and assessment and monitoring of symptoms      Monitor for signs and symptoms of strangulated hernia that would require immediate attention or emergency care include: nausea, vomiting, fever, severe pain, hernia bulge that turns red or purple, or inability to move bowels or pass gas.  If you experience any of these symptoms please seek emergency care.          If you have any questions, please don't hesitate to call our office:  [facility] Department of Surgery, Division of General and Acute Care Surgery  [address]  [place], [place]  [address]  Phone: [phone]  Fax: [phone]        [employer] Quality Collaborative [facility])      The [facility] aims to improve the value in hernia care delivered to patients. Formed in 2013 by hernia surgeons in private practice and academic settings, the [facility] utilizes concepts of continuous quality improvement to improve outcomes and optimize costs. This is accomplished through patient-centered data collection, ongoing performance feedback to clinicians, and improvement based on analysis of collected data and collaborative learning.       Information regarding the [facility] should be provided to you in clinic if you are scheduled for surgery.  It can also be found at: [email or web address].  Your participation in the registry is completely voluntary and deciding to not include your information in the [facility] registry will have no impact your care.       Once you have consented for surgery, your surgeon will include your personal health information (such as your name, birthdate, address, and health care treatment) in the [facility] confidential and secure data registry unless you request (as explained below) that your personal health information not be included. We hope you will allow your information to be included because doing so will help improve the quality of care for hernia patients      If you do not want your personal health information included in the [facility] database or you do not want to receive emails, text messages, or other communications from [facility], please inform your surgeon or contact [facility] (by email to [email or web address] or call [phone]).

## 2018-05-16 NOTE — Unmapped (Signed)
[facility] Refill Coordination Note    Specialty Medication(s) to be Shipped:   Transplant:  mycophenolic acid 180mg  and tacrolimus 1mg      Evelyn Rollins, DOB: July 30, 1966  Phone: [phone] (home)   Shipping Address: [address]  [place] [address]    All above HIPAA information was verified with patient.     Completed refill call assessment today to schedule patient's medication shipment from the [facility] [phone]).       Specialty medication(s) and dose(s) confirmed: Regimen is correct and unchanged.   Changes to medications: Evelyn Rollins reports no changes reported at this time.  Changes to insurance: No  Questions for the pharmacist: No    The patient will receive an FSI print out for each medication shipped and additional FDA Medication Guides as required.  Patient education from [facility] or Robet Leu may also be included in the shipment.    DISEASE-SPECIFIC INFORMATION        N/A    ADHERENCE     No Missed dose    MEDICARE PART B DOCUMENTATION     Mycophenolic acid 180mg : Patient has 10 days worth of tablets on hand.  Tacrolimus 1mg : Patient has 10 days worth of capsules on hand.    SHIPPING     Shipping address confirmed in FSI.     Delivery Scheduled: Yes, Expected medication delivery date: 05/25/2018 via UPS or courier.     Evelyn Rollins   [facility] Specialty Technician

## 2018-05-23 NOTE — Unmapped (Signed)
Called patient to discuss scheduling liver transplant annual appointments, patient confirmed 06/25/2018 will work, appointment letter mailed to patient.

## 2018-05-24 MED FILL — MYCOPHENOLIC ACID DR/180MG DR/TBEC: MYCOPHENOLIC ACID DR/180MG DR/TBEC | 30 days supply | Qty: 120 | Fill #9

## 2018-05-24 MED FILL — TACROLIMUS/1MG/CAPS: TACROLIMUS/1MG/CAPS | 30 days supply | Qty: 120 | Fill #9

## 2018-06-19 NOTE — Unmapped (Addendum)
8/5 UPDATE: pt due for clinical so made 1 additional attempt at both Home and Other numbers - again had to LVM at both numbers. Will message coordinator again-EF    [facility] Pharm[facility] Refill Coordination Note  Medication: Mycophenolic 180mg  & Tacrolimus 1mg     Unable to reach patient to schedule shipment for medication being filled at [facility]. Left voicemail on phone.  As this is the 3rd unsuccessful attempt to reach the patient, no additional phone call attempts will be made at this time.      Phone numbers attempted: [phone] Judie Petit) and [phone]    Last scheduled delivery: 05/24/2018    Please call the [facility] at [phone] (option 4) should you have any further questions.      Thanks,  [facility] Specialty Team

## 2018-06-25 ENCOUNTER — Ambulatory Visit: Admit: 2018-06-25 | Discharge: 2018-06-25 | Payer: MEDICARE

## 2018-06-25 DIAGNOSIS — Z944 Liver transplant status: Principal | ICD-10-CM

## 2018-06-25 DIAGNOSIS — D899 Disorder involving the immune mechanism, unspecified: Secondary | ICD-10-CM

## 2018-06-25 DIAGNOSIS — K760 Fatty (change of) liver, not elsewhere classified: Secondary | ICD-10-CM

## 2018-06-25 DIAGNOSIS — Z6841 Body Mass Index (BMI) 40.0 and over, adult: Secondary | ICD-10-CM

## 2018-06-25 LAB — COMPREHENSIVE METABOLIC PANEL
ALBUMIN: 4.1 g/dL (ref 3.5–5.0)
ALKALINE PHOSPHATASE: 79 U/L (ref 38–126)
ALT (SGPT): 59 U/L — ABNORMAL HIGH (ref 15–48)
ANION GAP: 9 mmol/L (ref 9–15)
AST (SGOT): 32 U/L (ref 14–38)
BILIRUBIN TOTAL: 0.3 mg/dL (ref 0.0–1.2)
BLOOD UREA NITROGEN: 22 mg/dL — ABNORMAL HIGH (ref 7–21)
BUN / CREAT RATIO: 23
CALCIUM: 9.3 mg/dL (ref 8.5–10.2)
CHLORIDE: 106 mmol/L (ref 98–107)
CO2: 25 mmol/L (ref 22.0–30.0)
CREATININE: 0.95 mg/dL (ref 0.60–1.00)
EGFR CKD-EPI AA FEMALE: 80 mL/min/{1.73_m2} (ref >=60)
EGFR CKD-EPI NON-AA FEMALE: 69 mL/min/{1.73_m2} (ref >=60)
GLUCOSE RANDOM: 101 mg/dL — ABNORMAL HIGH (ref 65–99)
POTASSIUM: 4.4 mmol/L (ref 3.5–5.0)
SODIUM: 140 mmol/L (ref 135–145)

## 2018-06-25 LAB — CBC W/ AUTO DIFF
BASOPHILS ABSOLUTE COUNT: 0 10*9/L (ref 0.0–0.1)
BASOPHILS RELATIVE PERCENT: 0.6 %
EOSINOPHILS ABSOLUTE COUNT: 0.2 10*9/L (ref 0.0–0.4)
EOSINOPHILS RELATIVE PERCENT: 3.3 %
HEMOGLOBIN: 13 g/dL (ref 12.0–16.0)
LARGE UNSTAINED CELLS: 2 % (ref 0–4)
LYMPHOCYTES ABSOLUTE COUNT: 2.7 10*9/L (ref 1.5–5.0)
LYMPHOCYTES RELATIVE PERCENT: 36.7 %
MEAN CORPUSCULAR HEMOGLOBIN CONC: 33 g/dL (ref 31.0–37.0)
MEAN CORPUSCULAR HEMOGLOBIN: 28.7 pg (ref 26.0–34.0)
MEAN CORPUSCULAR VOLUME: 86.9 fL (ref 80.0–100.0)
MEAN PLATELET VOLUME: 7.4 fL (ref 7.0–10.0)
MONOCYTES ABSOLUTE COUNT: 0.4 10*9/L (ref 0.2–0.8)
MONOCYTES RELATIVE PERCENT: 5.8 %
NEUTROPHILS ABSOLUTE COUNT: 3.9 10*9/L (ref 2.0–7.5)
NEUTROPHILS RELATIVE PERCENT: 51.8 %
PLATELET COUNT: 228 10*9/L (ref 150–440)
RED BLOOD CELL COUNT: 4.55 10*12/L (ref 4.00–5.20)
RED CELL DISTRIBUTION WIDTH: 14.6 % (ref 12.0–15.0)
WBC ADJUSTED: 7.4 10*9/L (ref 4.5–11.0)

## 2018-06-25 LAB — AST (SGOT): Aspartate aminotransferase:CCnc:Pt:Ser/Plas:Qn:: 32

## 2018-06-25 LAB — PLATELET COUNT: Lab: 228

## 2018-06-25 LAB — TACROLIMUS, TROUGH: Lab: 4 — ABNORMAL LOW

## 2018-06-25 LAB — MAGNESIUM: Magnesium:MCnc:Pt:Ser/Plas:Qn:: 1.8

## 2018-06-25 LAB — GAMMA GLUTAMYL TRANSFERASE: Gamma glutamyl transferase:CCnc:Pt:Ser/Plas:Qn:: 21

## 2018-06-25 LAB — PHOSPHORUS: Phosphate:MCnc:Pt:Ser/Plas:Qn:: 3.5

## 2018-06-25 LAB — BILIRUBIN DIRECT: Bilirubin.glucuronidated:MCnc:Pt:Ser/Plas:Qn:: <0.1

## 2018-06-25 NOTE — Unmapped (Signed)
Patient seen in clinic for her annual appointment with Gertie Fey, NP. Pt's husband accompanied patient in clinic. Denies N/V/Fever. Occasionally has loose stool related to the diet changes. Patient has been meeting with a dietitian for the past 3 weeks and has incorporated more fiber. Dietitian encouraged her to eat 5x a day. Patient mentioned she had lost weight months ago but she gained it back after her father passed away at the end of 2023-01-09. She had lost 4lbs but reports gaining it back after her recent vacation to [place]. Patient is aware of her need to lose weight in order to have a more successful hernia surgery with Dr. Carlynn Purl.   Patient has been doing Zumba 2x a week.   Wears an abdominal binder occasionally for support related to hernia; wears binder when exercises.   Took tacrolimus at 9pm last night.   Liver US and CXR done.   Entered new standing lab orders in [facility] who is aware to do them monthly and gave a copy of the lab order to the patient in case in the future [facility] cannot find the order.

## 2018-06-25 NOTE — Unmapped (Signed)
Entered standing lab order for [facility] and gave patient a copy.

## 2018-06-25 NOTE — Unmapped (Signed)
FOLLOW UP ANNUAL LIVER CLINIC NOTE     Patient Name: Evelyn Rollins  Medical Record Number: [id number]  Date of Service: 06/25/2018    Referring Physician: Jacinto Halim [facility]*   Current complaint: Follow up Annual Liver    Assessment/Plan:     Evelyn Rollins is a 52 y.o. 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  Recent testing/images stable; hepatic steatosis still demonstrated on imaging. Immunosuppressive levels were within goal range (4-6). Continue 2mg   BID of tacrolimus and 360mg  BID myfortic.    Recommend she continue to use abdominal binder whenever possible for hernia control .    For both weight loss/BG control and reduction of hepatic steatosis burden, recommend considering and GLP-1 in place of metformin. Recommend she follow up with PCP.     PCP to manage Vit D monitoring and supplementation in addition to thyroid management.    HEALTH MAINTENANCE:   - Dermatology: This patient is at increased risk for the development of skin cancers due to immunosuppressant medications. We recommend yearly surveillance.  - Mammogram: up to date  - Pap smear: Until age 20, we recommend routine surveillance.   - Colonoscopy: Until age 69, we recommend routine surveillance. Per patients local GI, would like hernia repair prior to completing next colonoscopy  - Dental: still has dental caries  - Mental health: no issues, stable on mood medications  Immunization History   Administered Date(s) Administered   ??? Hepatitis B, Adult 05/01/2015   ??? Influenza Vaccine Quad (IIV4 PF) 32mo+ injectable 08/20/2015   ??? PNEUMOCOCCAL POLYSACCHARIDE 23 04/29/2015   ??? PPD Test 04/29/2015   ??? SHINGRIX-ZOSTER VACCINE (HZV), RECOMBINANT,SUB-UNIT,ADJUVANTED IM 07/03/2017   She reports she received/completed Shingrix series with PCP.     Recommend she get her Prevnar-13 with PCP.    Return to clinic: 1 year  Labs: monthly    I spent a total of 25 minutes of which 50% was spent in counseling and coordination of care.    Subjective:     HPI: Evelyn Rollins is a 52 y.o. female who underwent liver transplant on 06/24/2015 for NAFLD cirrhosis c/b bile leak managed with ERCP and stent. Since last evaluation she established with Dr. Carlynn Purl in hopes of repairing her large ventral-incisional hernia; through this she established with [facility] medicine for weight loss and began to see some reduction in weight. She unfortunately became primary caregiver or her father who ultimately passed away early 06/08/2018; but she attributes the stress of the loss to her more recent weight gain. She then established with a local dietician who she hopes will assist her. She was started on metformin but hasn't experienced any weight loss; now taking exercise classes at her gym. She is otherwise functionally very well and active with her 7 grandchildren.     Denies fever, chills, arthralgias, and fatigue. Denies chest pain, SOB, N/V/D, constipation or abdominal pain. No acute complaint today.    Past Medical History:   Diagnosis Date   ??? Cirrhosis (CMS-HCC)    ??? Hypothyroidism    ??? NAFLD (nonalcoholic fatty liver disease)    ??? Obesity    ??? Seizure (CMS-HCC) Dec 2015       Past Surgical History:   Procedure Laterality Date   ??? CESAREAN SECTION  June 08, 1986, June 08, 1988    2   ??? ESOPHAGOGASTRODUODENOSCOPY  recently   ??? HYSTERECTOMY  2011-06-09   ??? PR ERCP BALLOON DILATE BILIARY/PANC DUCT/AMPULLA EA N/A 09/02/2015    Procedure: ERCP;WITH TRANS-ENDOSCOPIC BALLOON  DILATION OF BILIARY/PANCREATIC DUCT(S) OR OF AMPULLA, INCLUDING SPHINCTERECTOMY, WHEN PERFOREMD,EACH DUCT ([address]109);  Surgeon: Malcolm Metro, MD;  Location: [facility];  Service: Gastroenterology   ??? PR ERCP REMOVE FOREIGN BODY/STENT BILIARY/PANC DUCT N/A 09/02/2015    Procedure: ENDOSCOPIC RETROGRADE CHOLANGIOPANCREATOGRAPHY (ERCP); W/ REMOVAL OF FOREIGN BODY/STENT FROM BILIARY/PANCREATIC DUCT(S);  Surgeon: Malcolm Metro, MD;  Location: [facility];  Service: Gastroenterology   ??? PR ERCP STENT PLACEMENT BILIARY/PANCREATIC DUCT N/A 07/07/2015    Procedure: ENDOSCOPIC RETROGRADE CHOLANGIOPANCREATOGRAPHY (ERCP); WITH PLACEMENT OF ENDOSCOPIC STENT INTO BILIARY OR PANCREATIC DUCT;  Surgeon: Malcolm Metro, MD;  Location: [facility];  Service: Gastroenterology   ??? PR ERCP,W/REMOVAL STONE,BIL/PANCR DUCTS N/A 09/02/2015    Procedure: ERCP; W/ENDOSCOPIC RETROGRADE REMOVAL OF CALCULUS/CALCULI FROM BILIARY &/OR PANCREATIC DUCTS;  Surgeon: Malcolm Metro, MD;  Location: [facility];  Service: Gastroenterology   ??? PR TRANSPLANT LIVER,ALLOTRANSPLANT N/A 06/24/2015    Procedure: LIVER ALLOTRANSPLANTATION; ORTHOTOPIC, PARTIAL OR WHOLE, FROM CADAVER OR LIVING DONOR, ANY AGE;  Surgeon: Vivi Barrack, MD;  Location: MAIN OR [facility];  Service: Transplant   ??? PR TRANSPLANT,PREP DONOR LIVER, WHOLE N/A 06/24/2015    Procedure: [facility] STD PREP CAD DONOR WHOLE LIVER GFT PRIOR TNSPLNT,INC CHOLE,DISS/REM SURR TISSU WO TRISEG/LOBE SPLT;  Surgeon: Vivi Barrack, MD;  Location: MAIN OR [facility];  Service: Transplant       Family History   Problem Relation Age of Onset   ??? Breast cancer Mother    ??? Thyroid cancer Mother    ??? Ovarian cancer Mother    ??? Cancer Mother    ??? COPD Father    ??? Diabetes Father    ??? Melanoma Father    ??? Other Father         pacemaker       Social History     Socioeconomic History   ??? Marital status: Married     Spouse name: Not on file   ??? Number of children: Not on file   ??? Years of education: Not on file   ??? Highest education level: Not on file   Occupational History   ??? Not on file   Social Needs   ??? Financial resource strain: Not on file   ??? Food insecurity:     Worry: Not on file     Inability: Not on file   ??? Transportation needs:     Medical: Not on file     Non-medical: Not on file   Tobacco Use   ??? Smoking status: Never Smoker   ??? Smokeless tobacco: Never Used   Substance and Sexual Activity   ??? Alcohol use: No     Alcohol/week: 0.0 oz   ??? Drug use: No   ??? Sexual activity: Not on file   Lifestyle   ??? Physical activity:     Days per week: Not on file     Minutes per session: Not on file   ??? Stress: Not on file   Relationships   ??? Social connections:     Talks on phone: Not on file     Gets together: Not on file     Attends religious service: Not on file     Active member of club or organization: Not on file     Attends meetings of clubs or organizations: Not on file     Relationship status: Not on file   Other Topics Concern   ??? Not on file  Social History Narrative    Living situation: the patient lives with her husband.    Address [place], [place], [place]): [place], [place], [place]    Guardian/Payee: None        Family Contact: none provided    Outpatient Providers: no psychiatric providers    Relationship Status: Married     Children: Yes; two children and one stepchild    Education: HS graduate    Income/Employment/Disability: Doesn't currently work. 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 Informant: the patient     Domestic Violence: None known. Informant: the patient     Exposure/Witness to Violence: None known    Protective Services Involvement: None known    Current/Prior Legal: None known    Physical Aggression/Violence: None known    Access to Firearms: Yes, Unsecured     Gang Involvement: None known        Psychiatric History:    Diagnoses:    None        Inpatient hospitalizations:    None        Outpatient treatment:    None        Medication trials:    None per patient. Per medical chart, she received Valium for a brief period of time for some anxiety.        Suicide attempts/Self-injury:    None        Substance Use:    None        Family Psychiatric History:    None                   REVIEW OF SYSTEMS:   The balance of 10/12 systems is negative with the exception of HPI.    Objective:     MEDICATIONS:  No Known Allergies    Current Outpatient Medications   Medication Sig Dispense Refill   ??? acetaminophen (TYLENOL) 325 MG tablet Take 650 mg by mouth daily as needed.     ??? amLODIPine (NORVASC) 5 MG tablet Take 5 mg by mouth daily.     ??? aspirin (ECOTRIN) 81 MG tablet Take 81 mg by mouth daily.     ??? calcium carbonate/vitamin D3 (CALCIUM WITH VITAMIN D ORAL) Take 600 mg by mouth every other day. Calcium 600mg  and vitamin D 200units     ??? escitalopram oxalate (LEXAPRO) 10 MG tablet Take 10 mg by mouth daily.     ??? levothyroxine (SYNTHROID, LEVOTHROID) 112 MCG tablet TAKE ONE TABLET BY MOUTH ONCE DAILY     ??? LORazepam (ATIVAN) 0.5 MG tablet Take 1 mg by mouth two (2) times a day as needed for anxiety.      ??? magnesium oxide (MAG-OX) 400 mg tablet Take 400 mg by mouth.     ??? mycophenolate (MYFORTIC) 180 MG EC tablet Take 2 tablets (360 mg total) by mouth Two (2) times a day. (Patient taking differently: Take 360 mg by mouth Two (2) times a day. Started generic on Nov 20.) 360 tablet 3   ??? nystatin, bulk, 15 billion unit Powd 1 application by Miscellaneous route once as needed.     ??? pravastatin (PRAVACHOL) 40 MG tablet Take 40 mg by mouth daily. PCP started and managing     ??? tacrolimus (PROGRAF) 1 MG capsule Take 2 capsules (2 mg total) by mouth Two (2) times a day. (Patient taking differently: Take 2 mg by mouth Two (2)  times a day. Started on generic on Nov 20.) 360 capsule 3   ??? metFORMIN (GLUCOPHAGE-XR) 500 MG 24 hr tablet Take 1 tablet (500 mg total) by mouth daily with evening meal. 30 tablet 3     No current facility-administered medications for this visit.          PHYSICAL EXAM:  BP 144/89  - Pulse 73  - Temp 37.1 ??C (98.8 ??F) (Tympanic)  - Ht 157.5 cm (5' 2.01)  - Wt (!) 104.8 kg (231 lb 1.6 oz)  - BMI 42.26 kg/m??     General Appearance:  NAD, well appearing; obese.   HEENT:  [place]/AT. Well hydrated moist mucous membranes of the oral cavity; poor dentition. No scleral icterus. No cervical lymphadenopathy.   Pulmonary:    Normal respiratory effort. CTAB, without wheezes/crackles/rhonchi. Good air movement.    Cardiovascular:  Regular rate and rhythm, no murmur noted.   Extremities No edema.    Abdomen:   Normoactive bowel sounds, abdomen soft, non-tender and not distended, no Hepatosplenomegaly or masses. Abdominal scar well healed with large, incisional/ventral hernia to midline with small new hernia to R lateral aspect of incision ~2-3cm in diameter.    Musculoskeletal: No joint tenderness, full ROM. Normal gait.    Skin: Skin color, texture, turgor normal, no rashes or lesions.   Neurologic: Grossly intact.   Psychiatric: Judgement and insight appropriate.        LAB RESULTS:  All lab results last 24 hours:    Recent Results (from the past 48 hour(s))   Gamma GT    Collection Time: 06/25/18  8:46 AM   Result Value Ref Range    GGT 21 11 - 48 U/L   Magnesium Level    Collection Time: 06/25/18  8:46 AM   Result Value Ref Range    Magnesium 1.8 1.6 - 2.2 mg/dL   Phosphorus Level    Collection Time: 06/25/18  8:46 AM   Result Value Ref Range    Phosphorus 3.5 2.9 - 4.7 mg/dL   Bilirubin, Direct    Collection Time: 06/25/18  8:46 AM   Result Value Ref Range    Bilirubin, Direct <0.10 0.00 - 0.40 mg/dL   Comprehensive Metabolic Panel    Collection Time: 06/25/18  8:46 AM   Result Value Ref Range    Sodium 140 135 - 145 mmol/L    Potassium 4.4 3.5 - 5.0 mmol/L    Chloride 106 98 - 107 mmol/L    CO2 25.0 22.0 - 30.0 mmol/L    Anion Gap 9 9 - 15 mmol/L    BUN 22 (H) 7 - 21 mg/dL    Creatinine 1.61 0.96 - 1.00 mg/dL    BUN/Creatinine Ratio 23     EGFR CKD-EPI Non-African American, Female 69 >=60 mL/min/1.42m2    EGFR CKD-EPI African American, Female 80 >=60 mL/min/1.22m2    Glucose 101 (H) 65 - 99 mg/dL    Calcium 9.3 8.5 - 04.5 mg/dL    Albumin 4.1 3.5 - 5.0 g/dL    Total Protein 6.7 6.5 - 8.3 g/dL    Total Bilirubin 0.3 0.0 - 1.2 mg/dL    AST 32 14 - 38 U/L    ALT 59 (H) 15 - 48 U/L    Alkaline Phosphatase 79 38 - 126 U/L   CBC w/ Differential    Collection Time: 06/25/18  8:46 AM   Result Value Ref Range    WBC 7.4 4.5 - 11.0 10*9/L  RBC 4.55 4.00 - 5.20 10*12/L    HGB 13.0 12.0 - 16.0 g/dL    HCT 78.2 95.6 - 21.3 %    MCV 86.9 80.0 - 100.0 fL    MCH 28.7 26.0 - 34.0 pg    MCHC 33.0 31.0 - 37.0 g/dL    RDW 08.6 57.8 - 46.9 %    MPV 7.4 7.0 - 10.0 fL    Platelet 228 150 - 440 10*9/L    Neutrophils % 51.8 %    Lymphocytes % 36.7 %    Monocytes % 5.8 %    Eosinophils % 3.3 %    Basophils % 0.6 %    Absolute Neutrophils 3.9 2.0 - 7.5 10*9/L    Absolute Lymphocytes 2.7 1.5 - 5.0 10*9/L    Absolute Monocytes 0.4 0.2 - 0.8 10*9/L    Absolute Eosinophils 0.2 0.0 - 0.4 10*9/L    Absolute Basophils 0.0 0.0 - 0.1 10*9/L    Large Unstained Cells 2 0 - 4 %         IMAGING:  Xr Chest 2 Views  Result Date: 06/25/2018  Sequela of liver transplant with surgical clips in right upper quadrant, best visualized on lateral view. Radiographically clear lungs. No pleural effusion or pneumothorax. Unremarkable cardiomediastinal silhouette.     US Liver Transplant  Result Date: 06/25/2018  --Patent hepatic transplant vasculature with normal flow direction. Limited evaluation of the left hepatic vein. Slightly sluggish left portal vein velocity, similar to prior. --Stable resistive indices in the hepatic transplant arteries, within normal limits. --Hepatic steatosis. --Splenomegaly. _______________________________________________        Gertie Fey, DNP, APRN, FNP-C  [facility]  [address]  [place], [place]  [address]

## 2018-06-25 NOTE — Unmapped (Signed)
Pt's 06/25/18 annual liver US and CXR were reviewed by Gertie Fey, NP as noted in Epic. No interventions noted.   Fat on the liver US from last year was discussed with patient and the need to eat healthy and exercise.    Patient is currently trying to lose weight and working with nutritionist.

## 2018-07-14 NOTE — Unmapped (Signed)
Current Outpatient Medications on File Prior to Visit   Medication Sig   ??? acetaminophen (TYLENOL) 325 MG tablet Take 650 mg by mouth daily as needed.   ??? amLODIPine (NORVASC) 5 MG tablet Take 5 mg by mouth daily.   ??? aspirin (ECOTRIN) 81 MG tablet Take 81 mg by mouth daily.   ??? calcium carbonate/vitamin D3 (CALCIUM WITH VITAMIN D ORAL) Take 600 mg by mouth every other day. Calcium 600mg  and vitamin D 200units   ??? escitalopram oxalate (LEXAPRO) 10 MG tablet Take 10 mg by mouth daily.   ??? levothyroxine (SYNTHROID, LEVOTHROID) 112 MCG tablet TAKE ONE TABLET BY MOUTH ONCE DAILY   ??? LORazepam (ATIVAN) 0.5 MG tablet Take 1 mg by mouth two (2) times a day as needed for anxiety.    ??? magnesium oxide (MAG-OX) 400 mg tablet Take 400 mg by mouth.   ??? metFORMIN (GLUCOPHAGE-XR) 500 MG 24 hr tablet Take 1 tablet (500 mg total) by mouth daily with evening meal.   ??? mycophenolate (MYFORTIC) 180 MG EC tablet Take 2 tablets (360 mg total) by mouth Two (2) times a day. (Patient taking differently: Take 360 mg by mouth Two (2) times a day. Started generic on Nov 20.)   ??? nystatin, bulk, 15 billion unit Powd 1 application by Miscellaneous route once as needed.   ??? pravastatin (PRAVACHOL) 40 MG tablet Take 40 mg by mouth daily. PCP started and managing   ??? tacrolimus (PROGRAF) 1 MG capsule Take 2 capsules (2 mg total) by mouth Two (2) times a day. (Patient taking differently: Take 2 mg by mouth Two (2) times a day. Started on generic on Nov 20.)     No current facility-administered medications on file prior to visit.      No Known Allergies

## 2018-07-18 NOTE — Unmapped (Signed)
Epic [facility]) medication reconciliation is completed.

## 2018-07-23 NOTE — Unmapped (Signed)
[facility] Refill Coordination Note  Specialty Medication(s): Mycophenolate 180mg  tablets, Tacroplimus 1mg  capsules  Additional Medications shipped: none    Evelyn Rollins, DOB: 1966/03/15  Phone: [phone] (home) , Alternate phone contact: N/A  Phone or address changes today?: No  All above HIPAA information was verified with patient.  Shipping Address: [address]  [place] [address]   Insurance changes? No    Completed refill call assessment today to schedule patient's medication shipment from the [facility] [phone]).      Confirmed the medication and dosage are correct and have not changed: Yes, regimen is correct and unchanged.    Confirmed patient started or stopped the following medications in the past month:  No, there are no changes reported at this time.    Are you tolerating your medication?:  Evelyn Rollins reports tolerating the medication.    ADHERENCE    Is this medicine transplant or covered by Medicare Part B? Yes.    Tacrolimus 1 mg   Quantity filled last month: 120   # of tablets left on hand: 12  Mycophenolic Acid 180 mg   Quantity filled last month: 120   # of tablets left on hand: 12    Did you miss any doses in the past 4 weeks? No missed doses reported.    FINANCIAL/SHIPPING    Delivery Scheduled: Yes, Expected medication delivery date: 07/24/18     The patient will receive a drug information handout for each medication shipped and additional FDA Medication Guides as required.     Evelyn Rollins did not have any additional questions at this time.    Delivery address confirmed in Epic.    We will follow up with patient monthly for standard refill processing and delivery.      Thank you,  Mervyn Gay   [facility] Specialty Pharmacist

## 2018-07-23 NOTE — Unmapped (Signed)
[facility] Refill and Clinical Coordination Note  Medication(s): mycophenolate, tacrolimus  Keeping pt on accord tac per pt and previous note today when meds already scheduled.    Evelyn Rollins, DOB: January 29, 1966  Phone: [phone] (home) , Alternate phone contact: N/A  Shipping address: [address]  [place] [address]  Phone or address changes today?: No  All above HIPAA information verified.  Insurance changes? No    Completed refill and clinical call assessment today to schedule patient's medication shipment from the [facility] [phone]).      MEDICATION RECONCILIATION    Confirmed the medication and dosage are correct and have not changed: Yes, regimen is correct and unchanged.    Were there any changes to your medication(s) in the past month:  No, there are no changes reported at this time.    ADHERENCE    Is this medicine transplant or covered by Medicare Part B? Yes.    Tacrolimus 1 mg   Quantity filled last month: 120   # of tablets left on hand: 12 days left  Mycophenolic Acid 180 mg   Quantity filled last month: 120   # of tablets left on hand: 12 days left    Did you miss any doses in the past 4 weeks? No missed doses reported.  Adherence counseling provided? Not needed     SIDE EFFECT MANAGEMENT    Are you tolerating your medication?:  Evelyn Rollins reports tolerating the medication.  Side effect management discussed: None      Therapy is appropriate and should be continued.    Evidence of clinical benefit: See Epic note from 06/25/18      FINANCIAL/SHIPPING    Delivery Scheduled: Yes, Expected medication delivery date: 07/24/18 via ups - this was already set up by another pharmacy team member today, however  clinical was due and not done, so followed up and completed clinical, however per pt and epic note meds were already set up so didn't need to do it     Additional medications refilled: No additional medications/refills needed at this time.    The patient will receive a drug information handout for each medication shipped and additional FDA Medication Guides as required.      Evelyn Rollins did not have any additional questions at this time.    Delivery address confirmed in Epic.     We will follow up with patient monthly for standard refill processing and delivery.      Thank you,  Thad Ranger   [facility] Specialty Pharmacist

## 2018-07-24 MED FILL — TACROLIMUS 1 MG CAPSULE: 30 days supply | Qty: 120 | Fill #0 | Status: AC

## 2018-07-24 MED FILL — MYCOPHENOLATE SODIUM 180 MG TABLET,DELAYED RELEASE: 30 days supply | Qty: 120 | Fill #0 | Status: AC

## 2018-07-24 MED FILL — TACROLIMUS 1 MG CAPSULE: 30 days supply | Qty: 120 | Fill #0

## 2018-07-24 MED FILL — MYCOPHENOLATE SODIUM 180 MG TABLET,DELAYED RELEASE: 30 days supply | Qty: 120 | Fill #0

## 2018-08-15 LAB — CBC W/ DIFFERENTIAL
BANDED NEUTROPHILS ABSOLUTE COUNT: 0 10*3/uL (ref 0.0–0.1)
BASOPHILS ABSOLUTE COUNT: 0 10*3/uL (ref 0.0–0.2)
BASOPHILS RELATIVE PERCENT: 0 %
EOSINOPHILS ABSOLUTE COUNT: 0.3 10*3/uL (ref 0.0–0.4)
EOSINOPHILS RELATIVE PERCENT: 3 %
HEMATOCRIT: 41.7 % (ref 34.0–46.6)
HEMOGLOBIN: 13.4 g/dL (ref 11.1–15.9)
IMMATURE GRANULOCYTES: 1 %
LYMPHOCYTES ABSOLUTE COUNT: 2.7 10*3/uL (ref 0.7–3.1)
LYMPHOCYTES RELATIVE PERCENT: 35 %
MEAN CORPUSCULAR HEMOGLOBIN CONC: 32.1 g/dL (ref 31.5–35.7)
MEAN CORPUSCULAR HEMOGLOBIN: 28.3 pg (ref 26.6–33.0)
MEAN CORPUSCULAR VOLUME: 88 fL (ref 79–97)
MONOCYTES ABSOLUTE COUNT: 0.6 10*3/uL (ref 0.1–0.9)
MONOCYTES RELATIVE PERCENT: 7 %
NEUTROPHILS RELATIVE PERCENT: 54 %
PLATELET COUNT: 224 10*3/uL (ref 150–450)
RED BLOOD CELL COUNT: 4.74 x10E6/uL (ref 3.77–5.28)

## 2018-08-15 LAB — COMPREHENSIVE METABOLIC PANEL
ALBUMIN: 4.4 g/dL (ref 3.5–5.5)
ALKALINE PHOSPHATASE: 96 IU/L (ref 39–117)
AST (SGOT): 28 IU/L (ref 0–40)
BILIRUBIN TOTAL: 0.4 mg/dL (ref 0.0–1.2)
BLOOD UREA NITROGEN: 17 mg/dL (ref 6–24)
BUN / CREAT RATIO: 17 (ref 9–23)
CALCIUM: 9.5 mg/dL (ref 8.7–10.2)
CHLORIDE: 104 mmol/L (ref 96–106)
CO2: 23 mmol/L (ref 20–29)
CREATININE: 1.02 mg/dL — ABNORMAL HIGH (ref 0.57–1.00)
GFR MDRD NON AF AMER: 63 mL/min/{1.73_m2}
GLOBULIN, TOTAL: 2.4 g/dL (ref 1.5–4.5)
GLUCOSE: 95 mg/dL (ref 65–99)
POTASSIUM: 5 mmol/L (ref 3.5–5.2)
TOTAL PROTEIN: 6.8 g/dL (ref 6.0–8.5)

## 2018-08-15 LAB — SODIUM: Lab: 143

## 2018-08-15 LAB — GAMMA GLUTAMYL TRANSFERASE: Lab: 16

## 2018-08-15 LAB — MONOCYTES RELATIVE PERCENT: Lab: 7

## 2018-08-15 LAB — MAGNESIUM: Lab: 1.8

## 2018-08-15 LAB — PHOSPHORUS, SERUM: Lab: 3.4

## 2018-08-15 LAB — BILIRUBIN DIRECT: Lab: 0.13

## 2018-08-15 NOTE — Unmapped (Signed)
Received VM from patient saying she has had trouble sleeping and taken medications that have not worked including melatonin. She mentioned PCP started her on abilify and wanted to make sure this was okay. Also having hot flashes and asked if she could take black cohash that was recommended or blue MD.   Discussed the above with Stanford Breed, PharmD who mentioned abilify was okay to take. No herbals and okay with hormone replacements such as progestin/progesterone if PCP starts.     Talked with patient who mentioned having a tough month and did not eat right and exercise like she should.   Reviewed her 08/14/18 labs and patient confirmed taking her tacrolimus at 10pm the night before and this is a 14 hour trough. Tac pending. Discussed with her next set to do 12 hours after her night dose in hopes that her sleeping pill will be regulated by then. Discussed if need to change her tac to 11am and 11pm that this would be fine as long as consistent.     Mentioned the abilify was okay to take but she is not to take herbals such as black cohash. Mentioned it was okay if her PCP gave her hormones such as progestin if needed related to her hot flashes. Verbalized understanding.

## 2018-08-16 LAB — TACROLIMUS BLOOD: Lab: 5.6

## 2018-08-21 MED ORDER — MYCOPHENOLATE SODIUM 180 MG TABLET,DELAYED RELEASE
ORAL_TABLET | 9 refills | 0 days | Status: CP
Start: 2018-08-21 — End: 2019-06-13
  Filled 2018-08-23: qty 120, 30d supply, fill #0

## 2018-08-21 MED ORDER — TACROLIMUS 1 MG CAPSULE
ORAL_CAPSULE | 9 refills | 0 days | Status: CP
Start: 2018-08-21 — End: 2019-06-13
  Filled 2018-08-23: qty 120, 30d supply, fill #0

## 2018-08-22 NOTE — Unmapped (Signed)
[facility] Refill Coordination Note    Specialty Medication(s) to be Shipped:   Transplant: Myfortic 180mg  and Prograf 1mg      Evelyn Rollins, DOB: 1966-12-04  Phone: [phone] (home)   Shipping Address: [address]  [place] [address]    All above HIPAA information was verified with patient.     Completed refill call assessment today to schedule patient's medication shipment from the [facility] [phone]).       Specialty medication(s) and dose(s) confirmed: Regimen is correct and unchanged.   Changes to medications: Evelyn Rollins reports no changes reported at this time.  Changes to insurance: No  Questions for the pharmacist: No    The patient will receive a drug information handout for each medication shipped and additional FDA Medication Guides as required.      DISEASE/MEDICATION-SPECIFIC INFORMATION        N/A    ADHERENCE     Medication Adherence    Patient reported X missed doses in the last month:  0          MEDICARE PART B DOCUMENTATION     Myfortic 180mg : Patient has 4 days worth of tablets on hand.  Prograf 1mg : Patient has 4 days worth of capsules on hand.    SHIPPING     Shipping address confirmed in Epic.     Delivery Scheduled: Yes, Expected medication delivery date: 08/24/2018 via UPS or courier.     Evelyn Rollins Leodis Binet   [facility] Specialty Technician

## 2018-08-23 MED FILL — TACROLIMUS 1 MG CAPSULE: 30 days supply | Qty: 120 | Fill #0 | Status: AC

## 2018-08-23 MED FILL — MYCOPHENOLATE SODIUM 180 MG TABLET,DELAYED RELEASE: 30 days supply | Qty: 120 | Fill #0 | Status: AC

## 2018-09-13 NOTE — Unmapped (Addendum)
[facility] Pharm[facility] Refill Coordination Note    Due to nationwide backorder on tacrolimus 1mg  accord manufacturer, switching patient to [employer]. Clinic and patient notified, and patient aware of need for labwork. I will message the coordinator today with approximate date of manufacturer change so they can order appropriate labwork.      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  [place] [address]    All above HIPAA information was verified with patient.     Completed refill call assessment today to schedule patient's medication shipment from the [facility] [phone]).       Specialty medication(s) and dose(s) confirmed: Regimen is correct and unchanged.   Changes to medications: Keamber reports no changes reported at this time.  Changes to insurance: No  Questions for the pharmacist: No    The patient will receive a drug information handout for each medication shipped and additional FDA Medication Guides as required.      DISEASE/MEDICATION-SPECIFIC INFORMATION        N/A    ADHERENCE     Medication Adherence    Patient reported X missed doses in the last month:  0              MEDICARE PART B DOCUMENTATION     Myfortic 180mg : Patient has 10 DAYS WORTH OF tablets on hand.  Tacrolimus 1mg : Patient has 10 DAYS WORTH OF capsules on hand.    SHIPPING     Shipping address confirmed in Epic.     Delivery Scheduled: Yes, Expected medication delivery date: 09/20/18 via UPS or courier.     [place] A Tallula Grindle   [facility] Shared [facility] Specialty Technician

## 2018-09-19 MED FILL — TACROLIMUS 1 MG CAPSULE: 30 days supply | Qty: 120 | Fill #1 | Status: AC

## 2018-09-19 MED FILL — MYCOPHENOLATE SODIUM 180 MG TABLET,DELAYED RELEASE: 30 days supply | Qty: 120 | Fill #1

## 2018-09-19 MED FILL — TACROLIMUS 1 MG CAPSULE: 30 days supply | Qty: 120 | Fill #1

## 2018-09-19 MED FILL — MYCOPHENOLATE SODIUM 180 MG TABLET,DELAYED RELEASE: 30 days supply | Qty: 120 | Fill #1 | Status: AC

## 2018-10-09 NOTE — Unmapped (Signed)
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     Completed refill call assessment today to schedule patient's medication shipment from the [facility] [phone]).       Specialty medication(s) and dose(s) confirmed: Regimen is correct and unchanged.   Changes to medications: Yareli reports no changes reported at this time.  Changes to insurance: No  Questions for the pharmacist: No    The patient will receive a drug information handout for each medication shipped and additional FDA Medication Guides as required.      DISEASE/MEDICATION-SPECIFIC INFORMATION        N/A    ADHERENCE     Medication Adherence    Patient reported X missed doses in the last month:  0  Specialty Medication:  Mycopehnolate 180mg  & Tacrolimus 1mg   Patient is on additional specialty medications:  No  Patient is on more than two specialty medications:  No  Any gaps in refill history greater than 2 weeks in the last 3 months:  no  Demonstrates understanding of importance of adherence:  yes  Informant:  patient  Reliability of informant:  fairly reliable      Adherence tools used:  patient uses a pill box to manage medications          Confirmed plan for next specialty medication refill:  delivery by pharmacy          Refill Coordination    Has the Patients' Contact Information Changed:  No  Is the Shipping Address Different:  No         MEDICARE PART B DOCUMENTATION     Mycophenolic acid 180mg : Patient has 9 days worth of tablets on hand.  Tacrolimus 1mg : Patient has 9 days worth of capsules on hand.    SHIPPING     Shipping address confirmed in Epic.     Delivery Scheduled: Yes, Expected medication delivery date: 10/17/2018 via UPS or courier.     Medication will be delivered via UPS to the home address in [place].    Tiffiany Beadles P Allena Katz   [facility] Shared [facility] Specialty Technician

## 2018-10-11 NOTE — Unmapped (Signed)
Talked with patient saying we received a 2 notes from [facility] that she was converting to a different manufacturer tacrolimus around October 10 and that she was receiving testosterone injections.     Patient confirmed she changed to [employer] tac around October 10 and her local provider is giving her the lowest dose of testosterone injections every 4 months to help her sleep, regulate hormones, and help with bad hot flashes. She mentioned she is sleeping better and her hot flashes are much better.     Discussed the need for labs and says she plans to get them on Saturday.

## 2018-10-14 LAB — CBC W/ DIFFERENTIAL
BANDED NEUTROPHILS ABSOLUTE COUNT: 0 10*3/uL (ref 0.0–0.1)
BASOPHILS ABSOLUTE COUNT: 0 10*3/uL (ref 0.0–0.2)
BASOPHILS RELATIVE PERCENT: 1 %
EOSINOPHILS ABSOLUTE COUNT: 0.3 10*3/uL (ref 0.0–0.4)
EOSINOPHILS RELATIVE PERCENT: 3 %
HEMATOCRIT: 39.2 % (ref 34.0–46.6)
HEMOGLOBIN: 13.1 g/dL (ref 11.1–15.9)
IMMATURE GRANULOCYTES: 1 %
LYMPHOCYTES ABSOLUTE COUNT: 2.5 10*3/uL (ref 0.7–3.1)
LYMPHOCYTES RELATIVE PERCENT: 33 %
MEAN CORPUSCULAR HEMOGLOBIN CONC: 33.4 g/dL (ref 31.5–35.7)
MEAN CORPUSCULAR HEMOGLOBIN: 29.2 pg (ref 26.6–33.0)
MEAN CORPUSCULAR VOLUME: 87 fL (ref 79–97)
MONOCYTES ABSOLUTE COUNT: 0.6 10*3/uL (ref 0.1–0.9)
MONOCYTES RELATIVE PERCENT: 8 %
NEUTROPHILS ABSOLUTE COUNT: 4.2 10*3/uL (ref 1.4–7.0)
NEUTROPHILS RELATIVE PERCENT: 54 %
PLATELET COUNT: 243 10*3/uL (ref 150–450)
RED CELL DISTRIBUTION WIDTH: 13.6 % (ref 12.3–15.4)

## 2018-10-14 LAB — COMPREHENSIVE METABOLIC PANEL
A/G RATIO: 1.8 (ref 1.2–2.2)
ALBUMIN: 4.2 g/dL (ref 3.5–5.5)
ALKALINE PHOSPHATASE: 80 IU/L (ref 39–117)
ALT (SGPT): 69 IU/L — ABNORMAL HIGH (ref 0–32)
AST (SGOT): 37 IU/L (ref 0–40)
BILIRUBIN TOTAL: 0.4 mg/dL (ref 0.0–1.2)
BLOOD UREA NITROGEN: 21 mg/dL (ref 6–24)
BUN / CREAT RATIO: 19 (ref 9–23)
CALCIUM: 9.3 mg/dL (ref 8.7–10.2)
CHLORIDE: 106 mmol/L (ref 96–106)
CO2: 21 mmol/L (ref 20–29)
CREATININE: 1.11 mg/dL — ABNORMAL HIGH (ref 0.57–1.00)
GFR MDRD AF AMER: 66 mL/min/{1.73_m2}
GFR MDRD NON AF AMER: 57 mL/min/{1.73_m2} — ABNORMAL LOW
GLOBULIN, TOTAL: 2.3 g/dL (ref 1.5–4.5)
GLUCOSE: 89 mg/dL (ref 65–99)
POTASSIUM: 4.7 mmol/L (ref 3.5–5.2)
TOTAL PROTEIN: 6.5 g/dL (ref 6.0–8.5)

## 2018-10-14 LAB — MAGNESIUM
Lab: 1.9
MAGNESIUM: 1.9 mg/dL (ref 1.6–2.3)

## 2018-10-14 LAB — BILIRUBIN DIRECT: Lab: 0.14

## 2018-10-14 LAB — TOTAL PROTEIN: Lab: 6.5

## 2018-10-14 LAB — GAMMA GLUTAMYL TRANSFERASE: Lab: 18

## 2018-10-14 LAB — PHOSPHORUS, SERUM: Lab: 3.1

## 2018-10-14 LAB — LYMPHOCYTES ABSOLUTE COUNT: Lab: 2.5

## 2018-10-16 LAB — TACROLIMUS BLOOD: Lab: 7.7

## 2018-10-16 MED FILL — TACROLIMUS 1 MG CAPSULE: 30 days supply | Qty: 120 | Fill #2

## 2018-10-16 MED FILL — MYCOPHENOLATE SODIUM 180 MG TABLET,DELAYED RELEASE: 30 days supply | Qty: 120 | Fill #2 | Status: AC

## 2018-10-16 MED FILL — TACROLIMUS 1 MG CAPSULE: 30 days supply | Qty: 120 | Fill #2 | Status: AC

## 2018-10-16 MED FILL — MYCOPHENOLATE SODIUM 180 MG TABLET,DELAYED RELEASE: 30 days supply | Qty: 120 | Fill #2

## 2018-10-19 NOTE — Unmapped (Signed)
Called and left VM on pt's cell letting her know that her 10/13/18 labs are stable. Mentioned to get labs at the beginning of December to see if we need to adjust her tac or if we will leave it as its current dose. Mentioned to get her labs 12 hours after her night dose of tac in December to ensure we have a good trough.     (10/13/18 tac is slightly higher than goal and previous ones have been in goal; this is 2 months later and will see next month's labs)

## 2018-11-13 LAB — CBC W/ DIFFERENTIAL
BANDED NEUTROPHILS ABSOLUTE COUNT: 0 10*3/uL (ref 0.0–0.1)
BASOPHILS ABSOLUTE COUNT: 0.1 10*3/uL (ref 0.0–0.2)
BASOPHILS RELATIVE PERCENT: 1 %
EOSINOPHILS ABSOLUTE COUNT: 0.2 10*3/uL (ref 0.0–0.4)
EOSINOPHILS RELATIVE PERCENT: 3 %
HEMATOCRIT: 41 % (ref 34.0–46.6)
IMMATURE GRANULOCYTES: 1 %
LYMPHOCYTES ABSOLUTE COUNT: 2.4 10*3/uL (ref 0.7–3.1)
LYMPHOCYTES RELATIVE PERCENT: 31 %
MEAN CORPUSCULAR HEMOGLOBIN: 29.1 pg (ref 26.6–33.0)
MEAN CORPUSCULAR VOLUME: 87 fL (ref 79–97)
MONOCYTES ABSOLUTE COUNT: 0.6 10*3/uL (ref 0.1–0.9)
MONOCYTES RELATIVE PERCENT: 8 %
NEUTROPHILS ABSOLUTE COUNT: 4.3 10*3/uL (ref 1.4–7.0)
PLATELET COUNT: 232 10*3/uL (ref 150–450)
RED BLOOD CELL COUNT: 4.74 x10E6/uL (ref 3.77–5.28)
RED CELL DISTRIBUTION WIDTH: 13.2 % (ref 12.3–15.4)
WHITE BLOOD CELL COUNT: 7.6 10*3/uL (ref 3.4–10.8)

## 2018-11-13 LAB — COMPREHENSIVE METABOLIC PANEL
A/G RATIO: 1.9 (ref 1.2–2.2)
ALBUMIN: 4.5 g/dL (ref 3.5–5.5)
ALKALINE PHOSPHATASE: 93 IU/L (ref 39–117)
ALT (SGPT): 55 IU/L — ABNORMAL HIGH (ref 0–32)
AST (SGOT): 29 IU/L (ref 0–40)
BILIRUBIN TOTAL: 0.4 mg/dL (ref 0.0–1.2)
BLOOD UREA NITROGEN: 17 mg/dL (ref 6–24)
BUN / CREAT RATIO: 16 (ref 9–23)
CALCIUM: 9.4 mg/dL (ref 8.7–10.2)
CHLORIDE: 103 mmol/L (ref 96–106)
CO2: 21 mmol/L (ref 20–29)
CREATININE: 1.07 mg/dL — ABNORMAL HIGH (ref 0.57–1.00)
GLOBULIN, TOTAL: 2.4 g/dL (ref 1.5–4.5)
POTASSIUM: 4.7 mmol/L (ref 3.5–5.2)
SODIUM: 140 mmol/L (ref 134–144)

## 2018-11-13 LAB — BILIRUBIN TOTAL: Lab: 0.4

## 2018-11-13 LAB — GAMMA GLUTAMYL TRANSFERASE: Lab: 17

## 2018-11-13 LAB — PHOSPHORUS, SERUM: Lab: 2.6

## 2018-11-13 LAB — NEUTROPHILS ABSOLUTE COUNT: Lab: 4.3

## 2018-11-13 LAB — BILIRUBIN DIRECT: Lab: 0.13

## 2018-11-13 LAB — TACROLIMUS BLOOD: Lab: 5.4

## 2018-11-13 LAB — MAGNESIUM: Lab: 1.8

## 2018-11-13 NOTE — Unmapped (Signed)
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     Completed refill call assessment today to schedule patient's medication shipment from the [facility] [phone]).       Specialty medication(s) and dose(s) confirmed: Regimen is correct and unchanged.   Changes to medications: Evelyn Rollins reports no changes reported at this time.  Changes to insurance: No  Questions for the pharmacist: No    The patient will receive a drug information handout for each medication shipped and additional FDA Medication Guides as required.      DISEASE/MEDICATION-SPECIFIC INFORMATION        N/A    ADHERENCE     Medication Adherence    Patient reported X missed doses in the last month:  0      Adherence tools used:  patient uses a pill box to manage medications                      MEDICARE PART B DOCUMENTATION     Mycophenolic acid 180mg : Patient has 4 days worth of tablets on hand.  Tacrolimus 1mg : Patient has 4 days worth of capsules on hand.    SHIPPING     Shipping address confirmed in Epic.     Delivery Scheduled: Yes, Expected medication delivery date: 11/15/18 via UPS or courier.     Medication will be delivered via UPS to the home address in [place].    Evelyn Rollins   [facility] Shared [facility] Specialty Technician

## 2018-11-14 MED FILL — MYCOPHENOLATE SODIUM 180 MG TABLET,DELAYED RELEASE: 30 days supply | Qty: 120 | Fill #3 | Status: AC

## 2018-11-14 MED FILL — MYCOPHENOLATE SODIUM 180 MG TABLET,DELAYED RELEASE: 30 days supply | Qty: 120 | Fill #3

## 2018-11-14 MED FILL — TACROLIMUS 1 MG CAPSULE: 30 days supply | Qty: 120 | Fill #3 | Status: AC

## 2018-11-14 MED FILL — TACROLIMUS 1 MG CAPSULE: 30 days supply | Qty: 120 | Fill #3

## 2018-11-16 MED ORDER — ENVARSUS XR 1 MG TABLET,EXTENDED RELEASE
ORAL_TABLET | Freq: Every day | ORAL | 11 refills | 0.00000 days | Status: CP
Start: 2018-11-16 — End: 2018-11-16

## 2018-11-20 NOTE — Unmapped (Signed)
[facility] Specialty Medication Referral: PA Approved      Medication (Brand/Generic): Envarsus    Final Test Claim completed with resulted information below:    Patient ABLE to fill at [facility]  Insurance Company:  [facility]  Anticipated Copay: $0  Is anticipated copay with a copay card or grant? No    Does patient's insurance plan only allow a 15 day supply for the first 6 fills in the Split Fill Program? No  If yes, inform patient they can request to dis-enroll from the [facility] by calling the patient help desk at N/A.      If the copay is under the $25 defined limit, per policy there will be no further investigation of need for financial assistance at this time unless patient requests. This referral has been communicated to the provider and handed off to the [facility] team for further processing and filling of prescribed medication.   ______________________________________________________________________  Please utilize this referral for viewing purposes as it will serve as the central location for all relevant documentation and updates.

## 2018-12-16 LAB — CBC W/ DIFFERENTIAL
BANDED NEUTROPHILS ABSOLUTE COUNT: 0 10*3/uL (ref 0.0–0.1)
BASOPHILS ABSOLUTE COUNT: 0 10*3/uL (ref 0.0–0.2)
BASOPHILS RELATIVE PERCENT: 1 %
EOSINOPHILS ABSOLUTE COUNT: 0.2 10*3/uL (ref 0.0–0.4)
HEMATOCRIT: 40.9 % (ref 34.0–46.6)
HEMOGLOBIN: 13.8 g/dL (ref 11.1–15.9)
IMMATURE GRANULOCYTES: 0 %
LYMPHOCYTES ABSOLUTE COUNT: 3 10*3/uL (ref 0.7–3.1)
MEAN CORPUSCULAR HEMOGLOBIN CONC: 33.7 g/dL (ref 31.5–35.7)
MEAN CORPUSCULAR HEMOGLOBIN: 28.9 pg (ref 26.6–33.0)
MEAN CORPUSCULAR VOLUME: 86 fL (ref 79–97)
MONOCYTES ABSOLUTE COUNT: 0.6 10*3/uL (ref 0.1–0.9)
MONOCYTES RELATIVE PERCENT: 7 %
NEUTROPHILS ABSOLUTE COUNT: 3.8 10*3/uL (ref 1.4–7.0)
NEUTROPHILS RELATIVE PERCENT: 50 %
RED BLOOD CELL COUNT: 4.77 x10E6/uL (ref 3.77–5.28)
RED CELL DISTRIBUTION WIDTH: 13.2 % (ref 12.3–15.4)
WHITE BLOOD CELL COUNT: 7.6 10*3/uL (ref 3.4–10.8)

## 2018-12-16 LAB — BILIRUBIN DIRECT: Lab: 0.11

## 2018-12-16 LAB — MAGNESIUM: Lab: 2

## 2018-12-16 LAB — COMPREHENSIVE METABOLIC PANEL
A/G RATIO: 2 (ref 1.2–2.2)
ALBUMIN: 4.2 g/dL (ref 3.5–5.5)
ALKALINE PHOSPHATASE: 91 IU/L (ref 39–117)
ALT (SGPT): 40 IU/L — ABNORMAL HIGH (ref 0–32)
AST (SGOT): 23 IU/L (ref 0–40)
BLOOD UREA NITROGEN: 19 mg/dL (ref 6–24)
CALCIUM: 8.9 mg/dL (ref 8.7–10.2)
CHLORIDE: 107 mmol/L — ABNORMAL HIGH (ref 96–106)
CO2: 21 mmol/L (ref 20–29)
CREATININE: 0.96 mg/dL (ref 0.57–1.00)
GFR MDRD AF AMER: 79 mL/min/{1.73_m2}
GFR MDRD NON AF AMER: 68 mL/min/{1.73_m2}
GLOBULIN, TOTAL: 2.1 g/dL (ref 1.5–4.5)
GLUCOSE: 94 mg/dL (ref 65–99)
POTASSIUM: 4.6 mmol/L (ref 3.5–5.2)
SODIUM: 140 mmol/L (ref 134–144)
TOTAL PROTEIN: 6.3 g/dL (ref 6.0–8.5)

## 2018-12-16 LAB — CALCIUM: Lab: 8.9

## 2018-12-16 LAB — PHOSPHORUS, SERUM: Lab: 2.9

## 2018-12-16 LAB — GAMMA GLUTAMYL TRANSFERASE: Lab: 16

## 2018-12-16 LAB — LYMPHOCYTES ABSOLUTE COUNT: Lab: 3

## 2018-12-18 LAB — TACROLIMUS BLOOD: Lab: 3.5

## 2018-12-26 MED FILL — MYCOPHENOLATE SODIUM 180 MG TABLET,DELAYED RELEASE: 30 days supply | Qty: 120 | Fill #4

## 2018-12-26 MED FILL — TACROLIMUS 1 MG CAPSULE, IMMEDIATE-RELEASE: 30 days supply | Qty: 120 | Fill #4

## 2018-12-26 MED FILL — MYCOPHENOLATE SODIUM 180 MG TABLET,DELAYED RELEASE: 30 days supply | Qty: 120 | Fill #4 | Status: AC

## 2018-12-26 MED FILL — TACROLIMUS 1 MG CAPSULE: 30 days supply | Qty: 120 | Fill #4 | Status: AC

## 2018-12-26 NOTE — Unmapped (Signed)
Received notification for patient that if she changes to Envarsus that her copay would be around $8.95.     Talked with patient who was already aware of the shortage for tacrolimus. Mentioned at one point, it was at a critical state and claims were run for another form of tacrolimus called envarsus that is once a day. Mentioned due to others already transitioning to envarsus and with our pharmacy able to find supplier that we are not in a critical state and hoping to come out of the shortage in a few months.     Mentioned the Envarsus would be around $8.95 and when asked about her usual tacrolimus cost, she mentioned $3-$6. Discussed that with the change she would need to get labs 1 week after and then at least 2 weeks after this to ensure levels are stable.   Patient did not want to change to envarsus and wanted to remain on her cheaper tacrolimus and due to her labs and levels being fine right now.  Verbalized understanding that if her tacrolimus cost goes up and wants to change to Envarsus to let us know.  Let [facility] pharmacy know that patient is staying with normal tacrolimus for now.

## 2018-12-26 NOTE — Unmapped (Addendum)
1/15 update: received message from coordinator. Due to patient's current low copays on tac they would like her to stay on tac and not switch to envarsus. The patient will work with clinic based on pricing to make decisions for the future. But for now, stay on tac only (generic prograf)-ef      [facility] Special[facility] Pharmacy Refill and Clinical Coordination Note  Medication(s): tacrolimus, mycophenolate    Note- envarsus was approved through insurance back in Dec. However per clinic request, we were not to onboard any patients on envarsus until got ok from clinic. We have reached out to the clinic 7 times (12/10, 12/16, 12/26, 12/30, 1/2, 1/7, and 1/14) and have not gotten authorization to onboard envarsus.  As patient was overdue for refill call, went on and called patient today.  She was not aware of any envarsus potential change (clinic has not spoken to her about it). Since we do not have the okay to onboard,keeping her on tacrolimus at this time. Patient would like someone from clinic to reach out at some point to discuss envarsus with her, but is ok with staying on tac at this time.  I will message clinic today.    [name], DOB: 1966/04/06  Phone: [phone] (home) , Alternate phone contact: N/A  Shipping address: [address]  [place] [address]  Phone or address changes today?: No  All above HIPAA information verified.  Insurance changes? No    Completed refill and clinical call assessment today to schedule patient's medication shipment from the [facility] [phone]).      MEDICATION RECONCILIATION    Confirmed the medication and dosage are correct and have not changed: Yes, regimen is correct and unchanged.    Were there any changes to your medication(s) in the past month:  No, there are no changes reported at this time.    ADHERENCE    Is this medicine transplant or covered by Medicare Part B? Yes.    Mycophenolic acid 180mg : Patient has 2 days worth of tablets on hand.  Tacrolimus 1mg : Patient has 2 days worth of capsules on hand.    Did you miss any doses in the past 4 weeks? No missed doses reported.  Adherence counseling provided? Not needed     SIDE EFFECT MANAGEMENT    Are you tolerating your medication?:  Deaysia reports tolerating the medication.  Side effect management discussed: None      Therapy is appropriate and should be continued.    Evidence of clinical benefit: Do you feel that that the medication is helping? Yes      FINANCIAL/SHIPPING    Delivery Scheduled: Yes, Expected medication delivery date: 12/27/2018     Medication will be delivered via UPS to the home address in [facility].    Additional medications refilled: No additional medications/refills needed at this time.    The patient will receive a drug information handout for each medication shipped and additional FDA Medication Guides as required.      Casie did not have any additional questions at this time.    Delivery address confirmed in Epic.     We will follow up with patient monthly for standard refill processing and delivery.      Thank you,  Thad Ranger   [facility] Shared [facility] Specialty Pharmacist

## 2019-01-01 NOTE — Unmapped (Signed)
Please see encounter from 12/26/2018 by Carmelina Peal Rph for further details.

## 2019-01-15 NOTE — Unmapped (Signed)
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     Completed refill call assessment today to schedule patient's medication shipment from the [facility] [phone]).       Specialty medication(s) and dose(s) confirmed: Regimen is correct and unchanged.   Changes to medications: Evelyn Rollins reports no changes reported at this time.  Changes to insurance: No  Questions for the pharmacist: No    The patient will receive a drug information handout for each medication shipped and additional FDA Medication Guides as required.      DISEASE/MEDICATION-SPECIFIC INFORMATION        N/A    ADHERENCE     Medication Adherence    Patient reported X missed doses in the last month:  2  Specialty Medication:  Mycophenolic 180mg  & Tacrolimus 1mg    Patient is on additional specialty medications:  No  Patient is on more than two specialty medications:  No  Any gaps in refill history greater than 2 weeks in the last 3 months:  no  Demonstrates understanding of importance of adherence:  yes  Informant:  patient  Reliability of informant:  reliable  Adherence tools used:  patient uses a pill box to manage medications  Confirmed plan for next specialty medication refill:  delivery by pharmacy          Refill Coordination    Has the Patients' Contact Information Changed:  No  Is the Shipping Address Different:  No         MEDICARE PART B DOCUMENTATION     Mycophenolic acid 180mg : Patient has 10 days worth of tablets on hand.  Tacrolimus 1mg : Patient has 10 days worth of capsules on hand.    SHIPPING     Shipping address confirmed in Epic.     Delivery Scheduled: Yes, Expected medication delivery date: 01/25/2019 via UPS or courier.     Medication will be delivered via UPS to the home address in [place].    Evelyn Rollins   [facility] Shared [facility] Specialty Technician

## 2019-01-15 NOTE — Unmapped (Signed)
Received message from pharmacy when they discussed her refill that in the past month she has missed a couple of doses of her mycophenolate and tacrolimus from getting home late and forgetting.     Called and left VM for patient hearing she missed doses of her medication. Encouraged her to: 1) Get labs this week or beginning of next week to ensure labs are stable  2) If phone has an alarm, to set alarms to remember to take the doses  3) If she realizes she has not taken them, she has a 6 hour window from the time she would normally take them to take the medications and would move back the next dose to a couple of hours later.

## 2019-01-24 LAB — CBC W/ DIFFERENTIAL
BANDED NEUTROPHILS ABSOLUTE COUNT: 0.1 10*3/uL (ref 0.0–0.1)
BASOPHILS ABSOLUTE COUNT: 0 10*3/uL (ref 0.0–0.2)
BASOPHILS RELATIVE PERCENT: 0 %
EOSINOPHILS ABSOLUTE COUNT: 0.2 10*3/uL (ref 0.0–0.4)
EOSINOPHILS RELATIVE PERCENT: 3 %
HEMATOCRIT: 41.6 % (ref 34.0–46.6)
HEMOGLOBIN: 14.3 g/dL (ref 11.1–15.9)
IMMATURE GRANULOCYTES: 1 %
LYMPHOCYTES ABSOLUTE COUNT: 2.6 10*3/uL (ref 0.7–3.1)
LYMPHOCYTES RELATIVE PERCENT: 34 %
MEAN CORPUSCULAR HEMOGLOBIN CONC: 34.4 g/dL (ref 31.5–35.7)
MEAN CORPUSCULAR HEMOGLOBIN: 29.2 pg (ref 26.6–33.0)
MEAN CORPUSCULAR VOLUME: 85 fL (ref 79–97)
MONOCYTES ABSOLUTE COUNT: 0.7 10*3/uL (ref 0.1–0.9)
NEUTROPHILS RELATIVE PERCENT: 53 %
PLATELET COUNT: 215 10*3/uL (ref 150–450)
RED BLOOD CELL COUNT: 4.9 x10E6/uL (ref 3.77–5.28)
RED CELL DISTRIBUTION WIDTH: 13.1 % (ref 11.7–15.4)
WHITE BLOOD CELL COUNT: 7.8 10*3/uL (ref 3.4–10.8)

## 2019-01-24 LAB — COMPREHENSIVE METABOLIC PANEL
A/G RATIO: 1.9 (ref 1.2–2.2)
ALBUMIN: 4.3 g/dL (ref 3.8–4.9)
ALKALINE PHOSPHATASE: 99 IU/L (ref 39–117)
ALT (SGPT): 38 IU/L — ABNORMAL HIGH (ref 0–32)
AST (SGOT): 19 IU/L (ref 0–40)
BILIRUBIN TOTAL: 0.3 mg/dL (ref 0.0–1.2)
BUN / CREAT RATIO: 19 (ref 9–23)
CALCIUM: 9.2 mg/dL (ref 8.7–10.2)
CHLORIDE: 106 mmol/L (ref 96–106)
CO2: 21 mmol/L (ref 20–29)
CREATININE: 1.02 mg/dL — ABNORMAL HIGH (ref 0.57–1.00)
GFR MDRD NON AF AMER: 63 mL/min/{1.73_m2}
GLOBULIN, TOTAL: 2.3 g/dL (ref 1.5–4.5)
GLUCOSE: 102 mg/dL — ABNORMAL HIGH (ref 65–99)
POTASSIUM: 4.9 mmol/L (ref 3.5–5.2)
SODIUM: 141 mmol/L (ref 134–144)
TOTAL PROTEIN: 6.6 g/dL (ref 6.0–8.5)

## 2019-01-24 LAB — WHITE BLOOD CELL COUNT: Lab: 7.8

## 2019-01-24 LAB — CALCIUM: Lab: 9.2

## 2019-01-24 LAB — PHOSPHORUS, SERUM: Lab: 2.8 — ABNORMAL LOW

## 2019-01-24 LAB — BILIRUBIN DIRECT: Lab: 0.13

## 2019-01-24 LAB — MAGNESIUM: Lab: 1.8

## 2019-01-24 LAB — GAMMA GLUTAMYL TRANSFERASE: Lab: 20

## 2019-01-24 MED FILL — MYCOPHENOLATE SODIUM 180 MG TABLET,DELAYED RELEASE: 30 days supply | Qty: 120 | Fill #5

## 2019-01-24 MED FILL — TACROLIMUS 1 MG CAPSULE: 30 days supply | Qty: 120 | Fill #5 | Status: AC

## 2019-01-24 MED FILL — TACROLIMUS 1 MG CAPSULE, IMMEDIATE-RELEASE: 30 days supply | Qty: 120 | Fill #5

## 2019-01-24 MED FILL — MYCOPHENOLATE SODIUM 180 MG TABLET,DELAYED RELEASE: 30 days supply | Qty: 120 | Fill #5 | Status: AC

## 2019-01-25 LAB — TACROLIMUS BLOOD: Lab: 7.8

## 2019-01-25 NOTE — Unmapped (Signed)
Called and spoke with patient and reviewed her 01/23/2019 labs with her. She specifically asked about her ALT and discussed previous levels in comparison to this week's level. Patient mentioned she is trying hard to lose weight and in a program where they measure muscle/fat. Encouraged her to continue to be healthy.     Discussed her tac level being higher. Confirmed dosage and did not take before labs. Denies drinking grapefruit juice or pomegranite juice but was not sure if she drank sierra mist that contains grapefruit juice in it.   Mentioned we will see her next level in March and if needed the tac can be adjusted if the level is still high.

## 2019-02-18 DIAGNOSIS — Z944 Liver transplant status: Principal | ICD-10-CM

## 2019-02-20 LAB — CBC W/ DIFFERENTIAL
BANDED NEUTROPHILS ABSOLUTE COUNT: 0.1 10*3/uL (ref 0.0–0.1)
BASOPHILS ABSOLUTE COUNT: 0 10*3/uL (ref 0.0–0.2)
BASOPHILS RELATIVE PERCENT: 1 %
EOSINOPHILS ABSOLUTE COUNT: 0.3 10*3/uL (ref 0.0–0.4)
EOSINOPHILS RELATIVE PERCENT: 4 %
HEMATOCRIT: 41.5 % (ref 34.0–46.6)
HEMOGLOBIN: 13.8 g/dL (ref 11.1–15.9)
IMMATURE GRANULOCYTES: 1 %
LYMPHOCYTES ABSOLUTE COUNT: 2.5 10*3/uL (ref 0.7–3.1)
LYMPHOCYTES RELATIVE PERCENT: 34 %
LYMPHOCYTES RELATIVE PERCENT: 34 % (ref 0.0–0.2)
MEAN CORPUSCULAR HEMOGLOBIN CONC: 33.3 g/dL (ref 31.5–35.7)
MEAN CORPUSCULAR HEMOGLOBIN: 28.2 pg (ref 26.6–33.0)
MEAN CORPUSCULAR VOLUME: 85 fL (ref 79–97)
MONOCYTES ABSOLUTE COUNT: 0.6 10*3/uL (ref 0.1–0.9)
NEUTROPHILS RELATIVE PERCENT: 52 %
PLATELET COUNT: 213 10*3/uL (ref 150–450)
RED BLOOD CELL COUNT: 4.9 x10E6/uL (ref 3.77–5.28)
RED CELL DISTRIBUTION WIDTH: 13.2 % (ref 11.7–15.4)
WHITE BLOOD CELL COUNT: 7.3 10*3/uL (ref 3.4–10.8)

## 2019-02-20 LAB — COMPREHENSIVE METABOLIC PANEL
A/G RATIO: 1.9 (ref 1.2–2.2)
ALKALINE PHOSPHATASE: 93 IU/L (ref 39–117)
ALT (SGPT): 46 IU/L — ABNORMAL HIGH (ref 0–32)
ALT (SGPT): 46 IU/L — ABNORMAL HIGH (ref 0–32)
AST (SGOT): 25 IU/L (ref 0–40)
BILIRUBIN TOTAL: 0.3 mg/dL (ref 0.0–1.2)
BLOOD UREA NITROGEN: 16 mg/dL (ref 6–24)
CALCIUM: 8.9 mg/dL (ref 8.7–10.2)
CHLORIDE: 107 mmol/L — ABNORMAL HIGH (ref 96–106)
CO2: 20 mmol/L (ref 20–29)
CREATININE: 0.9 mg/dL (ref 0.57–1.00)
GFR MDRD AF AMER: 85 mL/min/{1.73_m2}
GFR MDRD NON AF AMER: 74 mL/min/{1.73_m2}
GLOBULIN, TOTAL: 2.2 g/dL (ref 1.5–4.5)
GLUCOSE: 106 mg/dL — ABNORMAL HIGH (ref 65–99)
POTASSIUM: 4.6 mmol/L (ref 3.5–5.2)
SODIUM: 141 mmol/L (ref 134–144)

## 2019-02-22 NOTE — Unmapped (Signed)
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     Completed refill call assessment today to schedule patient's medication shipment from the [facility] [phone]).       Specialty medication(s) and dose(s) confirmed: Regimen is correct and unchanged.   Changes to medications: Tomeca reports no changes reported at this time.  Changes to insurance: No  Questions for the pharmacist: No    Confirmed patient received Welcome Packet with first shipment. The patient will receive a drug information handout for each medication shipped and additional FDA Medication Guides as required.       DISEASE/MEDICATION-SPECIFIC INFORMATION        N/A    SPECIALTY MEDICATION ADHERENCE     Medication Adherence    Patient reported X missed doses in the last month:  0  Specialty Medication:  Mycophenolic 180mg   Patient is on additional specialty medications:  Yes  Additional Specialty Medications:  Tacrolimus 1mg   Patient Reported Additional Medication X Missed Doses in the Last Month:  0  Patient is on more than two specialty medications:  No  Adherence tools used:  patient uses a pill box to manage medications       Mycophenolic 180 mg: 7 days of medicine on hand   Tacrolimus 1 mg: 7 days of medicine on hand     SHIPPING     Shipping address confirmed in Epic.     Delivery Scheduled: Yes, Expected medication delivery date: 02/26/2019.     Medication will be delivered via UPS to the home address in [place].    Royden Bulman P Allena Katz   [facility] Shared [facility] Specialty Technician

## 2019-02-25 MED FILL — MYCOPHENOLATE SODIUM 180 MG TABLET,DELAYED RELEASE: 30 days supply | Qty: 120 | Fill #6 | Status: AC

## 2019-02-25 MED FILL — TACROLIMUS 1 MG CAPSULE, IMMEDIATE-RELEASE: 30 days supply | Qty: 120 | Fill #6

## 2019-02-25 MED FILL — MYCOPHENOLATE SODIUM 180 MG TABLET,DELAYED RELEASE: 30 days supply | Qty: 120 | Fill #6

## 2019-02-25 MED FILL — TACROLIMUS 1 MG CAPSULE: 30 days supply | Qty: 120 | Fill #6 | Status: AC

## 2019-03-04 DIAGNOSIS — Z944 Liver transplant status: Principal | ICD-10-CM

## 2019-03-22 NOTE — Unmapped (Signed)
[facility] Shared [facility] Pharmacy Clinical Assessment & Refill Coordination Note    Ahleah Simko, DOB: [birth year]  Phone: [phone] (home)     All above HIPAA information was verified with patient.     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 acetaminophen (TYLENOL) 325 MG tablet Take 650 mg by mouth daily as needed.     ??? amLODIPine (NORVASC) 5 MG tablet Take 5 mg by mouth daily.     ??? ARIPiprazole (ABILIFY) 5 MG tablet Take 5 mg by mouth nightly as needed (for sleep; PCP managing).     ??? aspirin (ECOTRIN) 81 MG tablet Take 81 mg by mouth daily.     ??? calcium carbonate/vitamin D3 (CALCIUM WITH VITAMIN D ORAL) Take 600 mg by mouth every other day. Calcium 600mg  and vitamin D 200units     ??? escitalopram oxalate (LEXAPRO) 10 MG tablet Take 10 mg by mouth daily.     ??? levothyroxine (SYNTHROID, LEVOTHROID) 112 MCG tablet TAKE ONE TABLET BY MOUTH ONCE DAILY     ??? LORazepam (ATIVAN) 0.5 MG tablet Take 1 mg by mouth two (2) times a day as needed for anxiety.      ??? magnesium oxide (MAG-OX) 400 mg tablet Take 400 mg by mouth.     ??? metFORMIN (GLUCOPHAGE-XR) 500 MG 24 hr tablet Take 1 tablet (500 mg total) by mouth daily with evening meal. 30 tablet 3   ??? mycophenolate (MYFORTIC) 180 MG EC tablet TAKE 2 TABLETS (360 MG) BY MOUTH TWICE DAILY 120 tablet 9   ??? nystatin, bulk, 15 billion unit Powd 1 application by Miscellaneous route once as needed.     ??? pravastatin (PRAVACHOL) 40 MG tablet Take 40 mg by mouth daily. PCP started and managing     ??? tacrolimus (PROGRAF) 1 MG capsule TAKE 2 CAPSULES (2 MG) BY MOUTH TWICE DAILY 120 capsule 9     No current facility-administered medications for this visit.         Changes to medications: Katheleen reports no changes reported at this time.    No Known Allergies    Changes to allergies: No    SPECIALTY MEDICATION ADHERENCE     Mycophenolate 180 mg: 7 days of medicine on hand   Tacrolimus 1 mg: 7 days of medicine on hand     Medication Adherence    Patient reported X missed doses in the last month:  1  Specialty Medication:  Mycophenolate 180mg   Patient is on additional specialty medications:  Yes  Additional Specialty Medications:  Tacrolimus 1mg   Patient Reported Additional Medication X Missed Doses in the Last Month:  1  Adherence tools used:  patient uses a pill box to manage medications          Specialty medication(s) dose(s) confirmed: Regimen is correct and unchanged.     Are there any concerns with adherence? No    Adherence counseling provided? Not needed    CLINICAL MANAGEMENT AND INTERVENTION      Clinical Benefit Assessment:    Do you feel the medicine is effective or helping your condition? Yes    Clinical Benefit counseling provided? Not needed    Adverse Effects Assessment:    Are you experiencing any side effects? No    Are you experiencing difficulty administering your medicine? 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 For example, brushing your teeth or getting up in the morning. 0    Have you discussed this with your provider? Not needed    Therapy Appropriateness:    Is therapy appropriate? Yes, therapy is appropriate and should be continued    DISEASE/MEDICATION-SPECIFIC INFORMATION      N/A    PATIENT SPECIFIC NEEDS     ? Does the patient have any physical, cognitive, or cultural barriers? No    ? Is the patient high risk? Yes, patient taking a REMS drug     ? Does the patient require a Care Management Plan? No     ? Does the patient require physician intervention or other additional services (i.e. nutrition, smoking cessation, social work)? 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     Medication will be delivered via UPS to the confirmed home address in [facility].    The patient will receive a drug information handout for each medication shipped and additional FDA Medication Guides as required.  Verified that patient has previously received a Conservation officer, historic buildings.    Tera Helper   [facility] Specialty Pharmacist

## 2019-03-23 LAB — CBC W/ DIFFERENTIAL
BASOPHILS ABSOLUTE COUNT: 0.1 10*3/uL (ref 0.0–0.2)
EOSINOPHILS ABSOLUTE COUNT: 0.3 10*3/uL (ref 0.0–0.4)
EOSINOPHILS RELATIVE PERCENT: 4 %
HEMATOCRIT: 43.6 % (ref 34.0–46.6)
HEMOGLOBIN: 13.8 g/dL (ref 11.1–15.9)
IMMATURE GRANULOCYTES: 1 %
LYMPHOCYTES ABSOLUTE COUNT: 3.2 10*3/uL — ABNORMAL HIGH (ref 0.7–3.1)
LYMPHOCYTES RELATIVE PERCENT: 40 %
MEAN CORPUSCULAR HEMOGLOBIN CONC: 31.7 g/dL (ref 31.5–35.7)
MEAN CORPUSCULAR HEMOGLOBIN: 27.3 pg (ref 26.6–33.0)
MEAN CORPUSCULAR VOLUME: 86 fL (ref 79–97)
MONOCYTES ABSOLUTE COUNT: 0.6 10*3/uL (ref 0.1–0.9)
MONOCYTES ABSOLUTE COUNT: 0.6 x10E3/uL — ABNORMAL HIGH (ref 0.1–0.9)
NEUTROPHILS ABSOLUTE COUNT: 3.8 10*3/uL (ref 1.4–7.0)
NEUTROPHILS RELATIVE PERCENT: 46 %
PLATELET COUNT: 250 10*3/uL (ref 150–450)
RED BLOOD CELL COUNT: 5.05 x10E6/uL (ref 3.77–5.28)
WHITE BLOOD CELL COUNT: 8.1 10*3/uL (ref 3.4–10.8)

## 2019-03-23 LAB — COMPREHENSIVE METABOLIC PANEL
A/G RATIO: 1.4 (ref 1.2–2.2)
ALKALINE PHOSPHATASE: 98 IU/L (ref 39–117)
AST (SGOT): 24 IU/L (ref 0–40)
BILIRUBIN TOTAL: 0.4 mg/dL (ref 0.0–1.2)
BLOOD UREA NITROGEN: 18 mg/dL (ref 6–24)
BUN / CREAT RATIO: 17 (ref 9–23)
CALCIUM: 9.6 mg/dL (ref 8.7–10.2)
CHLORIDE: 104 mmol/L (ref 96–106)
CO2: 22 mmol/L (ref 20–29)
GFR MDRD AF AMER: 67 mL/min/{1.73_m2}
GFR MDRD AF AMER: 67 mL/min/1.73 — ABNORMAL LOW (ref 1.2–2.2)
GFR MDRD NON AF AMER: 58 mL/min/{1.73_m2} — ABNORMAL LOW
GLOBULIN, TOTAL: 3 g/dL (ref 1.5–4.5)
GLUCOSE: 102 mg/dL — ABNORMAL HIGH (ref 65–99)
POTASSIUM: 4.7 mmol/L (ref 3.5–5.2)
SODIUM: 141 mmol/L (ref 134–144)
TOTAL PROTEIN: 7.2 g/dL (ref 6.0–8.5)

## 2019-03-25 MED FILL — MYCOPHENOLATE SODIUM 180 MG TABLET,DELAYED RELEASE: 30 days supply | Qty: 120 | Fill #7

## 2019-03-25 MED FILL — MYCOPHENOLATE SODIUM 180 MG TABLET,DELAYED RELEASE: 30 days supply | Qty: 120 | Fill #7 | Status: AC

## 2019-03-25 MED FILL — TACROLIMUS 1 MG CAPSULE: 30 days supply | Qty: 120 | Fill #7 | Status: AC

## 2019-03-25 MED FILL — TACROLIMUS 1 MG CAPSULE, IMMEDIATE-RELEASE: 30 days supply | Qty: 120 | Fill #7

## 2019-04-19 NOTE — Unmapped (Signed)
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 Clinic and patient notified, and patient aware of need for labwork. I will message the coordinator today with approximate date of manufacturer change so they can order appropriate labwork.Evelyn Rollins, DOB: March 11, 1966  Phone: [phone] (home)     All above HIPAA information was verified with patient.     Completed refill call assessment today to schedule patient's medication shipment from the [facility] [phone]).       Specialty medication(s) and dose(s) confirmed: Regimen is correct and unchanged.   Changes to medications: [name]  Changes to insurance: No  Questions for the pharmacist: No    Confirmed patient received Welcome Packet with first shipment. The patient will receive a drug information handout for each medication shipped and additional FDA Medication Guides as required.       DISEASE/MEDICATION-SPECIFIC INFORMATION        N/A    SPECIALTY MEDICATION ADHERENCE     Medication Adherence    Patient reported X missed doses in the last month:  0  Specialty Medication:  Tacrolimus 1mg   Patient is on additional specialty medications:  Yes  Additional Specialty Medications:  Mycophenolic 180mg   Patient Reported Additional Medication X Missed Doses in the Last Month:  0  Patient is on more than two specialty medications:  No  Any gaps in refill history greater than 2 weeks in the last 3 months:  no  Adherence tools used:  patient uses a pill box to manage medications        Mycophenolic 180 mg: 7 days of medicine on hand   Tacrolimus 1 mg: 7 days of medicine on hand     SHIPPING     Shipping address confirmed in Epic.     Delivery Scheduled: Yes, Expected medication delivery date: 04/24/2019.     Medication will be delivered via UPS to the home address in Epic [place].    [name]   [facility] Specialty Technician

## 2019-04-23 ENCOUNTER — Ambulatory Visit (INDEPENDENT_AMBULATORY_CARE_PROVIDER_SITE_OTHER): Payer: Medicare Other | Admitting: Internal Medicine

## 2019-04-23 MED FILL — MYCOPHENOLATE SODIUM 180 MG TABLET,DELAYED RELEASE: 30 days supply | Qty: 120 | Fill #8 | Status: AC

## 2019-04-23 MED FILL — TACROLIMUS 1 MG CAPSULE, IMMEDIATE-RELEASE: 30 days supply | Qty: 120 | Fill #8

## 2019-04-23 MED FILL — MYCOPHENOLATE SODIUM 180 MG TABLET,DELAYED RELEASE: 30 days supply | Qty: 120 | Fill #8

## 2019-04-23 MED FILL — TACROLIMUS 1 MG CAPSULE: 30 days supply | Qty: 120 | Fill #8 | Status: AC

## 2019-04-25 ENCOUNTER — Ambulatory Visit (INDEPENDENT_AMBULATORY_CARE_PROVIDER_SITE_OTHER): Payer: Medicare Other | Admitting: Internal Medicine

## 2019-05-09 LAB — COMPREHENSIVE METABOLIC PANEL
A/G RATIO: 1.7 (ref 1.2–2.2)
ALBUMIN: 4.3 g/dL (ref 3.8–4.9)
ALKALINE PHOSPHATASE: 94 IU/L (ref 39–117)
ALT (SGPT): 47 IU/L — ABNORMAL HIGH (ref 0–32)
AST (SGOT): 25 IU/L (ref 0–40)
BILIRUBIN TOTAL: 0.5 mg/dL (ref 0.0–1.2)
BLOOD UREA NITROGEN: 17 mg/dL (ref 6–24)
BUN / CREAT RATIO: 21 (ref 9–23)
CALCIUM: 9.4 mg/dL (ref 8.7–10.2)
CHLORIDE: 107 mmol/L — ABNORMAL HIGH (ref 96–106)
CO2: 20 mmol/L (ref 20–29)
GFR MDRD AF AMER: 96 mL/min/{1.73_m2}
GFR MDRD NON AF AMER: 83 mL/min/{1.73_m2}
GLOBULIN, TOTAL: 2.5 g/dL (ref 1.5–4.5)
GLUCOSE: 99 mg/dL (ref 65–99)
SODIUM: 142 mmol/L (ref 134–144)
TOTAL PROTEIN: 6.8 g/dL (ref 6.0–8.5)

## 2019-05-09 LAB — CBC W/ DIFFERENTIAL
BANDED NEUTROPHILS ABSOLUTE COUNT: 0.1 10*3/uL (ref 0.0–0.1)
BASOPHILS ABSOLUTE COUNT: 0 10*3/uL (ref 0.0–0.2)
BASOPHILS RELATIVE PERCENT: 1 %
EOSINOPHILS ABSOLUTE COUNT: 0.2 10*3/uL (ref 0.0–0.4)
EOSINOPHILS RELATIVE PERCENT: 3 %
HEMATOCRIT: 42.3 % (ref 34.0–46.6)
HEMOGLOBIN: 13.9 g/dL (ref 11.1–15.9)
IMMATURE GRANULOCYTES: 1 %
LYMPHOCYTES RELATIVE PERCENT: 36 %
MEAN CORPUSCULAR HEMOGLOBIN: 28 pg (ref 26.6–33.0)
MEAN CORPUSCULAR VOLUME: 85 fL (ref 79–97)
MONOCYTES RELATIVE PERCENT: 7 %
NEUTROPHILS ABSOLUTE COUNT: 4.1 10*3/uL (ref 1.4–7.0)
NEUTROPHILS RELATIVE PERCENT: 52 %
PLATELET COUNT: 231 10*3/uL (ref 150–450)
RED BLOOD CELL COUNT: 4.96 x10E6/uL (ref 3.77–5.28)
RED CELL DISTRIBUTION WIDTH: 14.5 % (ref 11.7–15.4)
WHITE BLOOD CELL COUNT: 7.8 10*3/uL (ref 3.4–10.8)

## 2019-05-09 LAB — BILIRUBIN DIRECT: Lab: 0.14

## 2019-05-09 LAB — PHOSPHORUS, SERUM: Lab: 2.7 — ABNORMAL LOW

## 2019-05-09 LAB — GAMMA GLUTAMYL TRANSFERASE: Lab: 17

## 2019-05-09 LAB — LYMPHOCYTES RELATIVE PERCENT: Lab: 36

## 2019-05-09 LAB — MAGNESIUM: Lab: 1.9

## 2019-05-09 LAB — A/G RATIO: Lab: 1.7

## 2019-05-10 LAB — TACROLIMUS BLOOD: Lab: 4.2

## 2019-05-20 NOTE — Unmapped (Signed)
[facility] Refill Coordination Note    Specialty Medication(s) to be Shipped:   Transplant:  mycophenolic acid 180mg  and tacrolimus 1mg      Evelyn Rollins, DOB: Apr 03, 1966  Phone: [phone] (home)     All above HIPAA information was verified with patient.     Completed refill call assessment today to schedule patient's medication shipment from the [facility] [phone]).       Specialty medication(s) and dose(s) confirmed: Regimen is correct and unchanged.   Changes to medications: Nana reports no changes reported at this time.  Changes to insurance: No  Questions for the pharmacist: No    Confirmed patient received Welcome Packet with first shipment. The patient will receive a drug information handout for each medication shipped and additional FDA Medication Guides as required.       DISEASE/MEDICATION-SPECIFIC INFORMATION        N/A    SPECIALTY MEDICATION ADHERENCE     Medication Adherence    Patient reported X missed doses in the last month:  0  Specialty Medication:  Mycophenolate 180mg   Patient is on additional specialty medications:  Yes  Additional Specialty Medications:  Tacrolimus 1mg    Patient Reported Additional Medication X Missed Doses in the Last Month:  0  Patient is on more than two specialty medications:  No  Adherence tools used:  patient uses a pill box to manage medications        Tacrolimus 1 mg: 7 days of medicine on hand   Mycopehnoalte 180 mg: 7 days of medicine on hand     SHIPPING     Shipping address confirmed in Epic.     Delivery Scheduled: Yes, Expected medication delivery date: 05/24/2019.     Medication will be delivered via UPS to the home address in [place].    Jari Carollo P Allena Katz   [facility] Shared [facility] Specialty Technician

## 2019-05-23 MED FILL — MYCOPHENOLATE SODIUM 180 MG TABLET,DELAYED RELEASE: 30 days supply | Qty: 120 | Fill #9 | Status: AC

## 2019-05-23 MED FILL — TACROLIMUS 1 MG CAPSULE, IMMEDIATE-RELEASE: 30 days supply | Qty: 120 | Fill #9

## 2019-05-23 MED FILL — MYCOPHENOLATE SODIUM 180 MG TABLET,DELAYED RELEASE: 30 days supply | Qty: 120 | Fill #9

## 2019-05-23 MED FILL — TACROLIMUS 1 MG CAPSULE: 30 days supply | Qty: 120 | Fill #9 | Status: AC

## 2019-06-13 MED ORDER — TACROLIMUS 1 MG CAPSULE
ORAL_CAPSULE | 9 refills | 0 days | Status: CP
Start: 2019-06-13 — End: 2019-08-07
  Filled 2019-06-20: qty 120, 30d supply, fill #0

## 2019-06-13 MED ORDER — MYCOPHENOLATE SODIUM 180 MG TABLET,DELAYED RELEASE
ORAL_TABLET | 9 refills | 0 days | Status: CP
Start: 2019-06-13 — End: 2020-06-24
  Filled 2019-06-20: qty 120, 30d supply, fill #0

## 2019-06-13 NOTE — Unmapped (Signed)
[facility] Refill Coordination Note    Specialty Medication(s) to be Shipped:   Transplant:  mycophenolic acid 180mg  and tacrolimus 1mg      Evelyn Rollins, DOB: [birth year]  Phone: [phone] (home)     All above HIPAA information was verified with patient.     Completed refill call assessment today to schedule patient's medication shipment from the [facility] [phone]).       Specialty medication(s) and dose(s) confirmed: Regimen is correct and unchanged.   Changes to medications: Evelyn Rollins reports no changes reported at this time.  Changes to insurance: No  Questions for the pharmacist: No    Confirmed patient received Welcome Packet with first shipment. The patient will receive a drug information handout for each medication shipped and additional FDA Medication Guides as required.       DISEASE/MEDICATION-SPECIFIC INFORMATION        N/A    SPECIALTY MEDICATION ADHERENCE     Medication Adherence    Patient reported X missed doses in the last month:  0  Specialty Medication:  Mycophenolic 180mg   Patient is on additional specialty medications:  Yes  Additional Specialty Medications:  Tacrolimus 1mg    Patient Reported Additional Medication X Missed Doses in the Last Month:  0  Patient is on more than two specialty medications:  No  Adherence tools used:  patient uses a pill box to manage medications       Mycophenolate 180 mg: 9 days of medicine on hand   Tacrolimus 1 mg: 9 days of medicine on hand     SHIPPING     Shipping address confirmed in Epic.     Delivery Scheduled: Yes, Expected medication delivery date: 06/21/2019.     Medication will be delivered via UPS to the home address in [place].    Evelyn Rollins Allena Katz   [facility] Shared [facility] Specialty Technician

## 2019-06-20 MED FILL — TACROLIMUS 1 MG CAPSULE: 30 days supply | Qty: 120 | Fill #0 | Status: AC

## 2019-06-20 MED FILL — MYCOPHENOLATE SODIUM 180 MG TABLET,DELAYED RELEASE: 30 days supply | Qty: 120 | Fill #0 | Status: AC

## 2019-06-21 LAB — CBC W/ DIFFERENTIAL
BANDED NEUTROPHILS ABSOLUTE COUNT: 0.1 10*3/uL (ref 0.0–0.1)
BASOPHILS ABSOLUTE COUNT: 0.1 10*3/uL (ref 0.0–0.2)
BASOPHILS RELATIVE PERCENT: 1 %
EOSINOPHILS ABSOLUTE COUNT: 0.3 10*3/uL (ref 0.0–0.4)
EOSINOPHILS RELATIVE PERCENT: 3 %
HEMATOCRIT: 41.3 % (ref 34.0–46.6)
LYMPHOCYTES ABSOLUTE COUNT: 3 10*3/uL (ref 0.7–3.1)
MEAN CORPUSCULAR HEMOGLOBIN CONC: 33.2 g/dL (ref 31.5–35.7)
MEAN CORPUSCULAR VOLUME: 85 fL (ref 79–97)
MONOCYTES ABSOLUTE COUNT: 0.7 10*3/uL (ref 0.1–0.9)
MONOCYTES RELATIVE PERCENT: 8 %
NEUTROPHILS ABSOLUTE COUNT: 4.4 10*3/uL (ref 1.4–7.0)
NEUTROPHILS RELATIVE PERCENT: 52 %
PLATELET COUNT: 241 10*3/uL (ref 150–450)
RED BLOOD CELL COUNT: 4.86 x10E6/uL (ref 3.77–5.28)
RED CELL DISTRIBUTION WIDTH: 13.6 % (ref 11.7–15.4)
WHITE BLOOD CELL COUNT: 8.5 10*3/uL (ref 3.4–10.8)

## 2019-06-21 LAB — COMPREHENSIVE METABOLIC PANEL
A/G RATIO: 1.7 (ref 1.2–2.2)
ALBUMIN: 4.3 g/dL (ref 3.8–4.9)
ALKALINE PHOSPHATASE: 90 IU/L (ref 39–117)
ALT (SGPT): 48 IU/L — ABNORMAL HIGH (ref 0–32)
BILIRUBIN TOTAL: 0.3 mg/dL (ref 0.0–1.2)
BLOOD UREA NITROGEN: 17 mg/dL (ref 6–24)
BUN / CREAT RATIO: 17 (ref 9–23)
CALCIUM: 9.2 mg/dL (ref 8.7–10.2)
CO2: 20 mmol/L (ref 20–29)
CREATININE: 1.01 mg/dL — ABNORMAL HIGH (ref 0.57–1.00)
GFR MDRD AF AMER: 73 mL/min/{1.73_m2}
GFR MDRD NON AF AMER: 64 mL/min/{1.73_m2}
GLOBULIN, TOTAL: 2.5 g/dL (ref 1.5–4.5)
GLUCOSE: 118 mg/dL — ABNORMAL HIGH (ref 65–99)
POTASSIUM: 4.7 mmol/L (ref 3.5–5.2)
SODIUM: 139 mmol/L (ref 134–144)
TOTAL PROTEIN: 6.8 g/dL (ref 6.0–8.5)

## 2019-06-21 LAB — GAMMA GLUTAMYL TRANSFERASE: Gamma glutamyl transferase:CCnc:Pt:Ser/Plas:Qn:: 19

## 2019-06-21 LAB — BILIRUBIN DIRECT: Bilirubin.glucuronidated+Bilirubin.albumin bound:MCnc:Pt:Ser/Plas:Qn:: 0.1

## 2019-06-21 LAB — PHOSPHORUS, SERUM: Phosphate:MCnc:Pt:Ser/Plas:Qn:: 3.3

## 2019-06-21 LAB — MAGNESIUM: Magnesium:MCnc:Pt:Ser/Plas:Qn:: 1.8

## 2019-06-21 LAB — GFR MDRD NON AF AMER
Glomerular filtration rate/1.73 sq M.predicted.non black:ArVRat:Pt:Ser/Plas/Bld:Qn:Creatinine-based formula (CKD-EPI): 64

## 2019-06-21 LAB — MEAN CORPUSCULAR HEMOGLOBIN CONC: Erythrocyte mean corpuscular hemoglobin concentration:MCnc:Pt:RBC:Qn:Automated count: 33.2

## 2019-06-22 LAB — TACROLIMUS BLOOD: Tacrolimus:MCnc:Pt:Bld:Qn:LC/MS/MS: 6.3

## 2019-06-25 NOTE — Unmapped (Signed)
Lab renewal in [facility]

## 2019-07-11 NOTE — Unmapped (Signed)
[facility] Refill Coordination Note    Specialty Medication(s) to be Shipped:   Transplant: mycophenolate mofetil 180mg  and tacrolimus 1mg     Other medication(s) to be shipped: none     Evelyn Rollins, DOB: [birth year]  Phone: [phone] (home)       All above HIPAA information was verified with patient.     Completed refill call assessment today to schedule patient's medication shipment from the [facility] [phone]).       Specialty medication(s) and dose(s) confirmed: Regimen is correct and unchanged.   Changes to medications: Evelyn Rollins reports no changes at this time.  Changes to insurance: No  Questions for the pharmacist: No    Confirmed patient received Welcome Packet with first shipment. The patient will receive a drug information handout for each medication shipped and additional FDA Medication Guides as required.       DISEASE/MEDICATION-SPECIFIC INFORMATION        N/A    SPECIALTY MEDICATION ADHERENCE     Medication Adherence    Patient reported X missed doses in the last month: 0  Specialty Medication: Mycophenolate 180mg   Patient is on additional specialty medications: Yes  Additional Specialty Medications: Tacrolimus 1mg    Patient is on more than two specialty medications: No  Adherence tools used: patient uses a pill box to manage medications               Mycophenolic 180 mg: 10 days of medicine on hand   Tacrolimus 1 mg: 10 days of medicine on hand     SHIPPING     Shipping address confirmed in Epic.     Delivery Scheduled: Yes, Expected medication delivery date: 07/18/19.     Medication will be delivered via UPS to the home address in [place].    [place] A Leaann Nevils   [facility] Shared [facility] Specialty Technician

## 2019-07-17 MED FILL — TACROLIMUS 1 MG CAPSULE: 30 days supply | Qty: 120 | Fill #1 | Status: AC

## 2019-07-17 MED FILL — MYCOPHENOLATE SODIUM 180 MG TABLET,DELAYED RELEASE: 30 days supply | Qty: 120 | Fill #1

## 2019-07-17 MED FILL — TACROLIMUS 1 MG CAPSULE, IMMEDIATE-RELEASE: 30 days supply | Qty: 120 | Fill #1

## 2019-07-17 MED FILL — MYCOPHENOLATE SODIUM 180 MG TABLET,DELAYED RELEASE: 30 days supply | Qty: 120 | Fill #1 | Status: AC

## 2019-07-17 NOTE — Unmapped (Signed)
Spoke with Evelyn Rollins about changing the MFG on tacrolimus from mylan to accord.  Told her to call the clinic and schedule labs when she starts the new mfg.  Pt acknowledged understanding.

## 2019-07-17 NOTE — Unmapped (Signed)
Talked with patient about options for annual appointment with [facility] on other phone. Patient to get her labs locally. Then she will do scans offsite and after scans she was fine to talk with Gertie Fey, NP virtually while in the car with her husband driving.   Aware [facility] will send letter with appointments via MyChart and mail.     In discussion she mentioned her PCP has been managing her medications for depression and she will be seeing a counselor in the near future; she mentioned they offered her a virtual visit but said the first time that she preferred an in person visit.

## 2019-08-03 LAB — COMPREHENSIVE METABOLIC PANEL
A/G RATIO: 1.8 (ref 1.2–2.2)
ALKALINE PHOSPHATASE: 95 IU/L (ref 39–117)
ALT (SGPT): 44 IU/L — ABNORMAL HIGH (ref 0–32)
BILIRUBIN TOTAL: 0.4 mg/dL (ref 0.0–1.2)
BLOOD UREA NITROGEN: 15 mg/dL (ref 6–24)
BUN / CREAT RATIO: 16 (ref 9–23)
CALCIUM: 8.9 mg/dL (ref 8.7–10.2)
CO2: 21 mmol/L (ref 20–29)
CREATININE: 0.93 mg/dL (ref 0.57–1.00)
GFR MDRD AF AMER: 81 mL/min/{1.73_m2}
GFR MDRD NON AF AMER: 70 mL/min/{1.73_m2}
GLOBULIN, TOTAL: 2.3 g/dL (ref 1.5–4.5)
GLUCOSE: 115 mg/dL — ABNORMAL HIGH (ref 65–99)
POTASSIUM: 4.7 mmol/L (ref 3.5–5.2)
TOTAL PROTEIN: 6.5 g/dL (ref 6.0–8.5)

## 2019-08-03 LAB — CBC W/ DIFFERENTIAL
BANDED NEUTROPHILS ABSOLUTE COUNT: 0.1 10*3/uL (ref 0.0–0.1)
BASOPHILS RELATIVE PERCENT: 1 %
EOSINOPHILS ABSOLUTE COUNT: 0.3 10*3/uL (ref 0.0–0.4)
EOSINOPHILS RELATIVE PERCENT: 3 %
HEMATOCRIT: 40.6 % (ref 34.0–46.6)
HEMOGLOBIN: 13.3 g/dL (ref 11.1–15.9)
IMMATURE GRANULOCYTES: 1 %
LYMPHOCYTES ABSOLUTE COUNT: 2.8 10*3/uL (ref 0.7–3.1)
LYMPHOCYTES RELATIVE PERCENT: 35 %
MEAN CORPUSCULAR HEMOGLOBIN CONC: 32.8 g/dL (ref 31.5–35.7)
MEAN CORPUSCULAR HEMOGLOBIN: 28.6 pg (ref 26.6–33.0)
MEAN CORPUSCULAR VOLUME: 87 fL (ref 79–97)
MONOCYTES ABSOLUTE COUNT: 0.6 10*3/uL (ref 0.1–0.9)
MONOCYTES RELATIVE PERCENT: 7 %
NEUTROPHILS ABSOLUTE COUNT: 4.4 10*3/uL (ref 1.4–7.0)
NEUTROPHILS RELATIVE PERCENT: 53 %
PLATELET COUNT: 225 10*3/uL (ref 150–450)
RED BLOOD CELL COUNT: 4.65 x10E6/uL (ref 3.77–5.28)
RED CELL DISTRIBUTION WIDTH: 13.2 % (ref 11.7–15.4)

## 2019-08-03 LAB — PHOSPHORUS, SERUM: Phosphate:MCnc:Pt:Ser/Plas:Qn:: 2.8 — ABNORMAL LOW

## 2019-08-03 LAB — GAMMA GLUTAMYL TRANSFERASE: Gamma glutamyl transferase:CCnc:Pt:Ser/Plas:Qn:: 20

## 2019-08-03 LAB — MAGNESIUM: Magnesium:MCnc:Pt:Ser/Plas:Qn:: 1.9

## 2019-08-03 LAB — BILIRUBIN DIRECT: Bilirubin.glucuronidated+Bilirubin.albumin bound:MCnc:Pt:Ser/Plas:Qn:: 0.11

## 2019-08-03 LAB — LYMPHOCYTES ABSOLUTE COUNT: Lymphocytes:NCnc:Pt:Bld:Qn:Automated count: 2.8

## 2019-08-03 LAB — TOTAL PROTEIN: Protein:MCnc:Pt:Ser/Plas:Qn:: 6.5

## 2019-08-04 LAB — TACROLIMUS BLOOD: Tacrolimus:MCnc:Pt:Bld:Qn:LC/MS/MS: 8.2

## 2019-08-05 NOTE — Unmapped (Signed)
Called patient to virtually room her for her video appointment she has with Gertie Fey, NP tomorrow. Patient aware her annual liver US and CXR are at [facility] tomorrow followed by her video appointment with Amil Amen.  Patient aware that scans may or may not be available at the time of her video appointment.   Patient had already reviewed her labs; confirmed she takes her tacrolimus at 10am and 10pm. Mentioned the 08/02/2019 tac is slightly above goal though the previous levels have been around the 4-6 goal. Aware Amil Amen will review these to determine if any changes to her immunosuppression are needed.   Denies N/V/D/Fever. Patient will be seeing her PCP next month. Patient waiting to be called for an appointment with a counselor b/c patient prefers being seen in person the first time with counselor. Denies thoughts of harming herself or others.   Was taking Zumba classes for activity. Since COVID has not been active. She mentioned she is staying at 230lbs and has worked with dietitians.   Hernia is bigger but no pain. Wears abd support sometimes. She mentioned Dr. Carlynn Purl encouraged her to lose weight for a better recovery from the hernia repair. Verbalized understanding to call Dr. Mosie Epstein office if there are issues with her hernia.  In free time, reading a lot and supporting her husband with the projects being done on her kitchen, living room and bathrooms.    Medications reviewed and updated; no known allergies.

## 2019-08-06 ENCOUNTER — Ambulatory Visit: Admit: 2019-08-06 | Discharge: 2019-08-06 | Payer: MEDICARE

## 2019-08-06 DIAGNOSIS — Z944 Liver transplant status: Principal | ICD-10-CM

## 2019-08-06 NOTE — Unmapped (Deleted)
FOLLOW UP ANNUAL LIVER CLINIC NOTE     Patient Name: Evelyn Rollins  Medical Record Number: [id number]  Date of Service: 08/06/2019    Referring Physician: Jacinto Halim [facility]*   Current complaint: Follow up Annual Liver    Assessment/Plan:     Shanita Kanan is a 53 y.o. 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  Recent testing/images stable; hepatic steatosis still demonstrated on imaging. Immunosuppressive levels were within goal range (4-6). Decrease from 2mg  BID to 2mg /1mg  of tacrolimus and continue 360mg  BID myfortic.    Recommend she continue to use abdominal binder whenever possible for hernia control .    For both weight loss/BG control and reduction of hepatic steatosis burden, recommend considering and GLP-1 in place of metformin. Recommend she follow up with PCP.     PCP to manage Vit D monitoring and supplementation in addition to thyroid management.    HEALTH MAINTENANCE:   - Dermatology: This patient is at increased risk for the development of skin cancers due to immunosuppressant medications. We recommend yearly surveillance.  - Mammogram: up to date  - Pap smear: Until age 32, we recommend routine surveillance.   - Colonoscopy: Until age 66, we recommend routine surveillance. Per patients local GI, would like hernia repair prior to completing next colonoscopy  - Dental: still has dental caries  - Mental health: no issues, stable on mood medications  Immunization History   Administered Date(s) Administered   ??? Hepatitis B, Adult 05/01/2015   ??? Influenza Vaccine Quad (IIV4 PF) 44mo+ injectable 08/20/2015   ??? PNEUMOCOCCAL POLYSACCHARIDE 23 04/29/2015   ??? PPD Test 04/29/2015   ??? SHINGRIX-ZOSTER VACCINE (HZV), RECOMBINANT,SUB-UNIT,ADJUVANTED IM 07/03/2017       Recommend she get her Prevnar-13 with PCP.    Return to clinic: 1 year  Labs: monthly    I spent a total of 25 minutes of which 50% was spent in counseling and coordination of care.    Subjective:     HPI: Evelyn Rollins is a 53 y.o. female who underwent liver transplant on 06/24/2015 for NAFLD cirrhosis c/b bile leak managed with ERCP and stent. Since last evaluation she established with Dr. Carlynn Purl in hopes of repairing her large ventral-incisional hernia; through this she established with [facility] medicine for weight loss and began to see some reduction in weight. She unfortunately became primary caregiver or her father who ultimately passed away early June 22, 2018; but she attributes the stress of the loss to her more recent weight gain. She then established with a local dietician who she hopes will assist her. She was started on metformin but hasn't experienced any weight loss; now taking exercise classes at her gym. She is otherwise functionally very well and active with her 7 grandchildren.     Denies fever, chills, arthralgias, and fatigue. Denies chest pain, SOB, N/V/D, constipation or abdominal pain. No acute complaint today.    Past Medical History:   Diagnosis Date   ??? Cirrhosis (CMS-HCC)    ??? Hypothyroidism    ??? NAFLD (nonalcoholic fatty liver disease)    ??? Obesity    ??? Seizure (CMS-HCC) Dec 2015       Past Surgical History:   Procedure Laterality Date   ??? CESAREAN SECTION  June 22, 1986, 06/22/88    2   ??? ESOPHAGOGASTRODUODENOSCOPY  recently   ??? HYSTERECTOMY  06/23/11   ??? PR ERCP BALLOON DILATE BILIARY/PANC DUCT/AMPULLA EA N/A 09/02/2015    Procedure: ERCP;WITH TRANS-ENDOSCOPIC BALLOON DILATION OF BILIARY/PANCREATIC DUCT(S) OR  OF AMPULLA, INCLUDING SPHINCTERECTOMY, WHEN PERFOREMD,EACH DUCT (09811);  Surgeon: Malcolm Metro, MD;  [facility]: [facility];  Service: Gastroenterology   ??? PR ERCP REMOVE FOREIGN BODY/STENT BILIARY/PANC DUCT N/A 09/02/2015    Procedure: ENDOSCOPIC RETROGRADE CHOLANGIOPANCREATOGRAPHY (ERCP); W/ REMOVAL OF FOREIGN BODY/STENT FROM BILIARY/PANCREATIC DUCT(S);  Surgeon: Malcolm Metro, MD;  Location: [facility];  Service: Gastroenterology   ??? PR ERCP STENT PLACEMENT BILIARY/PANCREATIC DUCT N/A 07/07/2015    Procedure: ENDOSCOPIC RETROGRADE CHOLANGIOPANCREATOGRAPHY (ERCP); WITH PLACEMENT OF ENDOSCOPIC STENT INTO BILIARY OR PANCREATIC DUCT;  Surgeon: Malcolm Metro, MD;  Location: [facility];  Service: Gastroenterology   ??? PR ERCP,W/REMOVAL STONE,BIL/PANCR DUCTS N/A 09/02/2015    Procedure: ERCP; W/ENDOSCOPIC RETROGRADE REMOVAL OF CALCULUS/CALCULI FROM BILIARY &/OR PANCREATIC DUCTS;  Surgeon: Malcolm Metro, MD;  Location: [facility];  Service: Gastroenterology   ??? PR TRANSPLANT LIVER,ALLOTRANSPLANT N/A 06/24/2015    Procedure: LIVER ALLOTRANSPLANTATION; ORTHOTOPIC, PARTIAL OR WHOLE, FROM CADAVER OR LIVING DONOR, ANY AGE;  Surgeon: Vivi Barrack, MD;  Location: MAIN OR [facility];  Service: Transplant   ??? PR TRANSPLANT,PREP DONOR LIVER, WHOLE N/A 06/24/2015    Procedure: [facility] STD PREP CAD DONOR WHOLE LIVER GFT PRIOR TNSPLNT,INC CHOLE,DISS/REM SURR TISSU WO TRISEG/LOBE SPLT;  Surgeon: Vivi Barrack, MD;  Location: MAIN OR [facility];  Service: Transplant       Family History   Problem Relation Age of Onset   ??? Breast cancer Mother    ??? Thyroid cancer Mother    ??? Ovarian cancer Mother    ??? Cancer Mother    ??? COPD Father    ??? Diabetes Father    ??? Melanoma Father    ??? Other Father         pacemaker       Social History     Socioeconomic History   ??? Marital status: Married     Spouse name: Not on file   ??? Number of children: Not on file   ??? Years of education: Not on file   ??? Highest education level: Not on file   Occupational History   ??? Not on file   Social Needs   ??? Financial resource strain: Not on file   ??? Food insecurity     Worry: Not on file     Inability: Not on file   ??? Transportation needs     Medical: Not on file     Non-medical: Not on file   Tobacco Use   ??? Smoking status: Never Smoker   ??? Smokeless tobacco: Never Used   Substance and Sexual Activity   ??? Alcohol use: No     Alcohol/week: 0.0 standard drinks   ??? Drug use: No   ??? Sexual activity: Not on file Lifestyle   ??? Physical activity     Days per week: Not on file     Minutes per session: Not on file   ??? Stress: Not on file   Relationships   ??? Social Wellsite geologist on phone: Not on file     Gets together: Not on file     Attends religious service: Not on file     Active member of club or organization: Not on file     Attends meetings of clubs or organizations: Not on file     Relationship status: Not on file   Other Topics Concern   ??? Not on file   Social History Narrative  Living situation: the patient lives with her husband.    Address [place], [place], [place], [place], [place]    Guardian/Payee: None        Family Contact: none provided    Outpatient Providers: no psychiatric providers    Relationship Status: Married     Children: Yes; two children and one stepchild    Education: HS graduate    Income/Employment/Disability: Doesn't currently work. 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 Informant: the patient     Domestic Violence: None known. Informant: the patient     Exposure/Witness to Violence: None known    Protective Services Involvement: None known    Current/Prior Legal: None known    Physical Aggression/Violence: None known    Access to Firearms: Yes, Unsecured     Gang Involvement: None known        Psychiatric History:    Diagnoses:    None        Inpatient hospitalizations:    None        Outpatient treatment:    None        Medication trials:    None per patient. Per medical chart, she received Valium for a brief period of time for some anxiety.        Suicide attempts/Self-injury:    None        Substance Use:    None        Family Psychiatric History:    None                   REVIEW OF SYSTEMS:   The balance of 10/12 systems is negative with the exception of HPI.    Objective:     MEDICATIONS:  No Known Allergies    Current Outpatient Medications   Medication Sig Dispense Refill   ??? acetaminophen (TYLENOL) 325 MG tablet Take 650 mg by mouth daily as needed.     ??? amLODIPine (NORVASC) 5 MG tablet Take 5 mg by mouth daily.     ??? ARIPiprazole (ABILIFY) 5 MG tablet Take 5 mg by mouth nightly.      ??? aspirin (ECOTRIN) 81 MG tablet Take 81 mg by mouth daily.     ??? buPROPion (WELLBUTRIN XL) 300 MG 24 hr tablet Take 300 mg by mouth daily. PCP managing     ??? calcium carbonate/vitamin D3 (CALCIUM WITH VITAMIN D ORAL) Take 600 mg by mouth every other day. Calcium 600mg  and vitamin D 200units     ??? escitalopram oxalate (LEXAPRO) 20 MG tablet Take 20 mg by mouth daily. PCP managing this     ??? levothyroxine (SYNTHROID, LEVOTHROID) 112 MCG tablet TAKE ONE TABLET BY MOUTH ONCE DAILY     ??? LORazepam (ATIVAN) 0.5 MG tablet Take 1 mg by mouth two (2) times a day as needed for anxiety.      ??? magnesium oxide (MAG-OX) 400 mg tablet Take 400 mg by mouth.     ??? mycophenolate (MYFORTIC) 180 MG EC tablet Take 2 tablets (360mg ) by mouth twice daily. 120 tablet 9   ??? nystatin, bulk, 15 billion unit Powd 1 application by Miscellaneous route once as needed.     ??? pravastatin (PRAVACHOL) 40 MG tablet Take 40 mg by mouth daily. PCP started and managing     ??? tacrolimus (PROGRAF) 1 MG capsule Take 2 capsules (2mg ) by mouth twice daily. 120 capsule 9  No current facility-administered medications for this visit.          PHYSICAL EXAM:  There were no vitals taken for this visit. No physical exam completed via virtual visit.    LAB RESULTS:  All lab results reviewed from most recent blood draw.    IMAGING:  Xr Chest 2 Views  Result Date: 06/25/2018  Sequela of liver transplant with surgical clips in right upper quadrant, best visualized on lateral view. Radiographically clear lungs. No pleural effusion or pneumothorax. Unremarkable cardiomediastinal silhouette.     US Liver Transplant  Result Date: 06/25/2018  --Patent hepatic transplant vasculature with normal flow direction. Limited evaluation of the left hepatic vein. Slightly sluggish left portal vein velocity, similar to prior. --Stable resistive indices in the hepatic transplant arteries, within normal limits. --Hepatic steatosis. --Splenomegaly.     _______________________________________________        Gertie Fey, DNP, APRN, FNP-C  [facility]  [address]  [place], [place]  [address]

## 2019-08-06 NOTE — Unmapped (Signed)
Called patient to discuss rescheduling julia appt as per julia inbasket request. Appt rescheduled as per pt preference.patient confirmed she can receive doximity text message.

## 2019-08-07 ENCOUNTER — Telehealth: Admit: 2019-08-07 | Discharge: 2019-08-08 | Payer: MEDICARE

## 2019-08-07 DIAGNOSIS — Z944 Liver transplant status: Principal | ICD-10-CM

## 2019-08-07 DIAGNOSIS — D899 Disorder involving the immune mechanism, unspecified: Secondary | ICD-10-CM

## 2019-08-07 DIAGNOSIS — E669 Obesity, unspecified: Secondary | ICD-10-CM

## 2019-08-07 DIAGNOSIS — R739 Hyperglycemia, unspecified: Secondary | ICD-10-CM

## 2019-08-07 DIAGNOSIS — K760 Fatty (change of) liver, not elsewhere classified: Secondary | ICD-10-CM

## 2019-08-07 MED ORDER — PEN NEEDLE, DIABETIC 29 GAUGE X 1/2" (12 MM)
Freq: Every day | 0 refills | 0.00000 days | Status: CP
Start: 2019-08-07 — End: 2019-11-20

## 2019-08-07 MED ORDER — LIRAGLUTIDE 0.6 MG/0.1 ML (18 MG/3 ML) SUBCUTANEOUS PEN INJECTOR
Freq: Every day | SUBCUTANEOUS | 3 refills | 90 days | Status: CP
Start: 2019-08-07 — End: 2020-08-06
  Filled 2019-08-12: qty 6, 60d supply, fill #0

## 2019-08-07 MED ORDER — TACROLIMUS 1 MG CAPSULE
ORAL_CAPSULE | 9 refills | 0 days | Status: CP
Start: 2019-08-07 — End: ?
  Filled 2019-08-12: qty 90, 30d supply, fill #0

## 2019-08-07 NOTE — Unmapped (Signed)
FOLLOW UP ANNUAL LIVER CLINIC NOTE     Patient Name: Evelyn Rollins  Medical Record Number: [id number]  Date of Service: 08/07/2019    Referring Physician: Jacinto Halim [facility]*   Current complaint: Follow up Annual Liver    Assessment/Plan:     Evelyn Rollins is a 53 y.o. 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  Recent testing/images stable; hepatic steatosis still demonstrated on imaging. Immunosuppressive levels were within goal range (4-6). Decrease from 2mg  BID to 2mg /1mg  of tacrolimus and continue 360mg  BID myfortic.    Weight Loss  -  Recommend she continue to use abdominal binder whenever possible for hernia control .  - For both weight loss/BG control and reduction of hepatic steatosis burden, will start liraglutide after running test claim through shared services pharmacy, per patient request, to ensure affordability. Will follow up with PCP, Kristen Hemberg.    HEALTH MAINTENANCE:   - Dermatology: This patient is at increased risk for the development of skin cancers due to immunosuppressant medications. We recommend yearly surveillance.  - Mammogram: up to date  - Pap smear: Until age 19, we recommend routine surveillance.   - Colonoscopy: Until age 76, we recommend routine surveillance. Per patients local GI, would like hernia repair prior to completing next colonoscopy  - Dental: still has dental caries  - Mental health: stable on mood medications but shared significant feelings of worsening anxiety/depression during covid. A significant portion of time was spent providing therapeutic listening  Immunization History   Administered Date(s) Administered   ??? Hepatitis B, Adult 05/01/2015   ??? Influenza Vaccine Quad (IIV4 PF) 81mo+ injectable 08/20/2015   ??? PNEUMOCOCCAL POLYSACCHARIDE 23 04/29/2015   ??? PPD Test 04/29/2015   ??? SHINGRIX-ZOSTER VACCINE (HZV), RECOMBINANT,SUB-UNIT,ADJUVANTED IM 07/03/2017     Recommend she get her Prevnar-13 with PCP in addition to annual influenza vaccine as soon as possible given COVID overlap.    Return to clinic: 1 year  Labs: labs in 2 weeks then monthly    I spent 30 minutes on the real-time audio and video with the patient. I spent an additional 10 minutes on pre- and post-visit activities.     The patient was physically located in [place] or a state in which I am permitted to provide care. The patient and/or parent/guardian understood that s/he may incur co-pays and cost sharing, and agreed to the telemedicine visit. The visit was reasonable and appropriate under the circumstances given the patient's presentation at the time.    The patient and/or parent/guardian has been advised of the potential risks and limitations of this mode of treatment (including, but not limited to, the absence of in-person examination) and has agreed to be treated using telemedicine. The patient's/patient's family's questions regarding telemedicine have been answered.     If the visit was completed in an ambulatory setting, the patient and/or parent/guardian has also been advised to contact their provider???s office for worsening conditions, and seek emergency medical treatment and/or call 911 if the patient deems either necessary.      Subjective:     HPI: Evelyn Rollins is a 53 y.o. female who underwent liver transplant on 06/24/2015 for NAFLD cirrhosis c/b bile leak managed with ERCP and stent. Since last evaluation she unfortunately has not lost any weight and has been unable to seek out hernia repair options. She continues to deal with anxiety/depression she reports worse given COVID pandemic.  Otherwise doing well from a liver transplant perspective and no opportunistic infections  throughout the year.     Denies fever, chills, arthralgias, and fatigue. Denies chest pain, SOB, N/V/D, constipation or abdominal pain. No acute complaint today.    Past Medical History:   Diagnosis Date   ??? Cirrhosis (CMS-HCC)    ??? Hypothyroidism    ??? NAFLD (nonalcoholic fatty liver disease)    ??? Obesity    ??? Seizure (CMS-HCC) Dec 2015       Past Surgical History:   Procedure Laterality Date   ??? CESAREAN SECTION  1987, 1989    2   ??? ESOPHAGOGASTRODUODENOSCOPY  recently   ??? HYSTERECTOMY  2012   ??? PR ERCP BALLOON DILATE BILIARY/PANC DUCT/AMPULLA EA N/A 09/02/2015    Procedure: ERCP;WITH TRANS-ENDOSCOPIC BALLOON DILATION OF BILIARY/PANCREATIC DUCT(S) OR OF AMPULLA, INCLUDING SPHINCTERECTOMY, WHEN PERFOREMD,EACH DUCT ([address]);  Surgeon: Malcolm Metro, MD;  [facility];  Service: Gastroenterology   ??? PR ERCP REMOVE FOREIGN BODY/STENT BILIARY/PANC DUCT N/A 09/02/2015    Procedure: ENDOSCOPIC RETROGRADE CHOLANGIOPANCREATOGRAPHY (ERCP); W/ REMOVAL OF FOREIGN BODY/STENT FROM BILIARY/PANCREATIC DUCT(S);  Surgeon: Malcolm Metro, MD;  Location: [facility];  Service: Gastroenterology   ??? PR ERCP STENT PLACEMENT BILIARY/PANCREATIC DUCT N/A 07/07/2015    Procedure: ENDOSCOPIC RETROGRADE CHOLANGIOPANCREATOGRAPHY (ERCP); WITH PLACEMENT OF ENDOSCOPIC STENT INTO BILIARY OR PANCREATIC DUCT;  Surgeon: Malcolm Metro, MD;  Location: [facility];  Service: Gastroenterology   ??? PR ERCP,W/REMOVAL STONE,BIL/PANCR DUCTS N/A 09/02/2015    Procedure: ERCP; W/ENDOSCOPIC RETROGRADE REMOVAL OF CALCULUS/CALCULI FROM BILIARY &/OR PANCREATIC DUCTS;  Surgeon: Malcolm Metro, MD;  [facility];  Service: Gastroenterology   ??? PR TRANSPLANT LIVER,ALLOTRANSPLANT N/A 06/24/2015    Procedure: LIVER ALLOTRANSPLANTATION; ORTHOTOPIC, PARTIAL OR WHOLE, FROM CADAVER OR LIVING DONOR, ANY AGE;  Surgeon: Vivi Barrack, MD;  Location: MAIN OR [facility];  Service: Transplant   ??? PR TRANSPLANT,PREP DONOR LIVER, WHOLE N/A 06/24/2015    Procedure: [facility] STD PREP CAD DONOR WHOLE LIVER GFT PRIOR TNSPLNT,INC CHOLE,DISS/REM SURR TISSU WO TRISEG/LOBE SPLT;  Surgeon: Vivi Barrack, MD;  Location: MAIN OR [facility];  Service: Transplant       Family History Problem Relation Age of Onset   ??? Breast cancer Mother    ??? Thyroid cancer Mother    ??? Ovarian cancer Mother    ??? Cancer Mother    ??? COPD Father    ??? Diabetes Father    ??? Melanoma Father    ??? Other Father         pacemaker       Social History     Socioeconomic History   ??? Marital status: Married     Spouse name: Not on file   ??? Number of children: Not on file   ??? Years of education: Not on file   ??? Highest education level: Not on file   Occupational History   ??? Not on file   Social Needs   ??? Financial resource strain: Not on file   ??? Food insecurity     Worry: Not on file     Inability: Not on file   ??? Transportation needs     Medical: Not on file     Non-medical: Not on file   Tobacco Use   ??? Smoking status: Never Smoker   ??? Smokeless tobacco: Never Used   Substance and Sexual Activity   ??? Alcohol use: No     Alcohol/week: 0.0 standard drinks   ??? Drug use: No   ???  Sexual activity: Not on file   Lifestyle   ??? Physical activity     Days per week: Not on file     Minutes per session: Not on file   ??? Stress: Not on file   Relationships   ??? Social Wellsite geologist on phone: Not on file     Gets together: Not on file     Attends religious service: Not on file     Active member of club or organization: Not on file     Attends meetings of clubs or organizations: Not on file     Relationship status: Not on file   Other Topics Concern   ??? Not on file   Social History Narrative    Living situation: the patient lives with her husband.    Address [place], [place], [place]): [place], [place], [place]    Guardian/Payee: None        Family Contact: none provided    Outpatient Providers: no psychiatric providers    Relationship Status: Married     Children: Yes; two children and one stepchild    Education: HS graduate    Income/Employment/Disability: Doesn't currently work. 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 Informant: the patient     Domestic Violence: None known. Informant: the patient     Exposure/Witness to Violence: None known    Protective Services Involvement: None known    Current/Prior Legal: None known    Physical Aggression/Violence: None known    Access to Firearms: Yes, Unsecured     Gang Involvement: None known        Psychiatric History:    Diagnoses:    None        Inpatient hospitalizations:    None        Outpatient treatment:    None        Medication trials:    None per patient. Per medical chart, she received Valium for a brief period of time for some anxiety.        Suicide attempts/Self-injury:    None        Substance Use:    None        Family Psychiatric History:    None                   REVIEW OF SYSTEMS:   The balance of 10/12 systems is negative with the exception of HPI.    Objective:     MEDICATIONS:  No Known Allergies    Current Outpatient Medications   Medication Sig Dispense Refill   ??? acetaminophen (TYLENOL) 325 MG tablet Take 650 mg by mouth daily as needed.     ??? amLODIPine (NORVASC) 5 MG tablet Take 5 mg by mouth daily.     ??? ARIPiprazole (ABILIFY) 5 MG tablet Take 5 mg by mouth nightly.      ??? aspirin (ECOTRIN) 81 MG tablet Take 81 mg by mouth daily.     ??? buPROPion (WELLBUTRIN XL) 300 MG 24 hr tablet Take 300 mg by mouth daily. PCP managing     ??? calcium carbonate/vitamin D3 (CALCIUM WITH VITAMIN D ORAL) Take 600 mg by mouth every other day. Calcium 600mg  and vitamin D 200units     ??? escitalopram oxalate (LEXAPRO) 20 MG tablet Take 20 mg by mouth daily. PCP managing this     ??? fluticasone propionate (FLONASE)  50 mcg/actuation nasal spray 2 sprays into each nostril.     ??? levothyroxine (SYNTHROID, LEVOTHROID) 112 MCG tablet TAKE ONE TABLET BY MOUTH ONCE DAILY     ??? liothyronine (CYTOMEL) 5 MCG tablet TAKE 1 TABLET ON AN EMPTY STOMACH X 7 DAYS, THEN INCREASE TO 2 TABS IF 1 TAB TOLERATED     ??? LORazepam (ATIVAN) 0.5 MG tablet Take 1 mg by mouth two (2) times a day as needed for anxiety.      ??? magnesium oxide (MAG-OX) 400 mg tablet Take 400 mg by mouth.     ??? magnesium oxide 150 mg magnesium Tab tablet Take by mouth.     ??? mycophenolate (MYFORTIC) 180 MG EC tablet Take 2 tablets (360mg ) by mouth twice daily. 120 tablet 9   ??? nystatin, bulk, 15 billion unit Powd 1 application by Miscellaneous route once as needed.     ??? pravastatin (PRAVACHOL) 40 MG tablet Take 40 mg by mouth daily. PCP started and managing     ??? progesterone (PROMETRIUM) 100 MG capsule Take 100 mg by mouth.     ??? tacrolimus (PROGRAF) 1 MG capsule Take 2 capsules (2mg ) by mouth twice daily. 120 capsule 9   ??? zolpidem (AMBIEN) 5 MG tablet Take 1 tab by mouth at bedtime       No current facility-administered medications for this visit.          PHYSICAL EXAM:  There were no vitals taken for this visit. No physical exam completed via virtual visit.    LAB RESULTS:  All lab results reviewed from most recent blood draw.    IMAGING:  Xr Chest 2 Views  Result Date: 08/06/2019  Clear lungs    US Liver Transplant  Result Date: 08/06/2019  -- Patent hepatic transplant vasculature. Stable resistive indices in the hepatic transplant arteries, within normal limits.   --Similar to prior exam, sluggish left portal vein velocity.  --Hepatomegaly. Hepatic steatosis.  ??     _______________________________________________        Gertie Fey, DNP, APRN, FNP-C  [facility]  [address]  [place], [place]  [address]

## 2019-08-07 NOTE — Unmapped (Signed)
Tacrolimus dose change  Copay = $3.90  Last filled 08/05    Clinic also sent scripts for pen needles ($8.95) and Victoza ($8.95 for 90 days)

## 2019-08-07 NOTE — Unmapped (Signed)
Pt's annual 08/06/2019 liver US and CXR were reviewed by Gertie Fey, NP as noted both in Epic and clinic note and no interventions noted.

## 2019-08-09 NOTE — Unmapped (Signed)
[facility] Clinical Assessment & Refill Coordination Note    Evelyn Rollins, DOB: 08-25-1966  Phone: [phone] (home)     All above HIPAA information was verified with patient.     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 acetaminophen (TYLENOL) 325 MG tablet Take 650 mg by mouth daily as needed.     ??? amLODIPine (NORVASC) 5 MG tablet Take 5 mg by mouth daily.     ??? ARIPiprazole (ABILIFY) 5 MG tablet Take 5 mg by mouth nightly.      ??? aspirin (ECOTRIN) 81 MG tablet Take 81 mg by mouth daily.     ??? buPROPion (WELLBUTRIN XL) 300 MG 24 hr tablet Take 300 mg by mouth daily. PCP managing     ??? calcium carbonate/vitamin D3 (CALCIUM WITH VITAMIN D ORAL) Take 600 mg by mouth every other day. Calcium 600mg  and vitamin D 200units     ??? escitalopram oxalate (LEXAPRO) 20 MG tablet Take 20 mg by mouth daily. PCP managing this     ??? fluticasone propionate (FLONASE) 50 mcg/actuation nasal spray 2 sprays into each nostril.     ??? levothyroxine (SYNTHROID, LEVOTHROID) 112 MCG tablet TAKE ONE TABLET BY MOUTH ONCE DAILY     ??? liothyronine (CYTOMEL) 5 MCG tablet TAKE 1 TABLET ON AN EMPTY STOMACH X 7 DAYS, THEN INCREASE TO 2 TABS IF 1 TAB TOLERATED     ??? liraglutide (VICTOZA) injection pen Inject 0.1 mL (0.6 mg total) under the skin daily. 9 mL 3   ??? LORazepam (ATIVAN) 0.5 MG tablet Take 1 mg by mouth two (2) times a day as needed for anxiety.      ??? magnesium oxide (MAG-OX) 400 mg tablet Take 400 mg by mouth.     ??? magnesium oxide 150 mg magnesium Tab tablet Take by mouth.     ??? mycophenolate (MYFORTIC) 180 MG EC tablet Take 2 tablets (360mg ) by mouth twice daily. 120 tablet 9   ??? nystatin, bulk, 15 billion unit Powd 1 application by Miscellaneous route once as needed.     ??? pen needle, diabetic (BD ULTRA-FINE ORIG PEN NEEDLE) 29 gauge x 1/2 (12 mm) Ndle Use as directed once daily 100 each 0   ??? pravastatin (PRAVACHOL) 40 MG tablet Take 40 mg by mouth daily. PCP started and managing     ??? progesterone (PROMETRIUM) 100 MG capsule Take 100 mg by mouth.     ??? tacrolimus (PROGRAF) 1 MG capsule Take 2 capsules (2mg ) by mouth in the morning and 1 capsule (1mg ) in the evening. 120 capsule 9   ??? zolpidem (AMBIEN) 5 MG tablet Take 1 tab by mouth at bedtime       No current facility-administered medications for this visit.         Changes to medications: Mikyah reports no changes at this time.    No Known Allergies    Changes to allergies: No    SPECIALTY MEDICATION ADHERENCE     Mycophenolate 180 mg: 4 days of medicine on hand   Tacrolimus 1 mg: 4 days of medicine on hand        Medication Adherence    Patient reported X missed doses in the last month: 0  Specialty Medication: mycophenolate 180mg   Patient is on additional specialty medications: Yes  Additional Specialty Medications: Tacrolimus 1mg   Patient Reported Additional  Medication X Missed Doses in the Last Month: 0  Adherence tools used: patient uses a pill box to manage medications          Specialty medication(s) dose(s) confirmed: Regimen is correct and unchanged.     Are there any concerns with adherence? No    Adherence counseling provided? Not needed    CLINICAL MANAGEMENT AND INTERVENTION      Clinical Benefit Assessment:    Do you feel the medicine is effective or helping your condition? Yes    Clinical Benefit counseling provided? Not needed    Adverse Effects Assessment:    Are you experiencing any side effects? No    Are you experiencing difficulty administering your medicine? No    Quality of Life Assessment:    How many days over the past month did your liver transplant  keep you from your normal activities? For example, brushing your teeth or getting up in the morning. 0    Have you discussed this with your provider? Not needed    Therapy Appropriateness:    Is therapy appropriate? Yes, therapy is appropriate and should be continued DISEASE/MEDICATION-SPECIFIC INFORMATION      N/A    PATIENT SPECIFIC NEEDS     ? Does the patient have any physical, cognitive, or cultural barriers? No    ? Is the patient high risk? Yes, patient taking a REMS drug     ? Does the patient require a Care Management Plan? No     ? Does the patient require physician intervention or other additional services (i.e. nutrition, smoking cessation, social work)? No      SHIPPING     Specialty Medication(s) to be Shipped:   Transplant:  mycophenolic acid 180mg  and tacrolimus 1mg     Other medication(s) to be shipped: Pen Needles, Victoza     Changes to insurance: No    Delivery Scheduled: Yes, Expected medication delivery date: 08/13/2019.     Medication will be delivered via UPS to the confirmed home address in [facility].    The patient will receive a drug information handout for each medication shipped and additional FDA Medication Guides as required.  Verified that patient has previously received a Conservation officer, historic buildings.    All of the patient's questions and concerns have been addressed.    Tera Helper   [facility] Specialty Pharmacist

## 2019-08-09 NOTE — Unmapped (Signed)
[facility] Pharmacist Intervention    Type of intervention: dosage change    Medication: tacrolimus    Problem: pt was on 2/2, new rx sent to [facility] for 2/1    Intervention: per epic notes this was communicated to patient via provider at 8/26 office visit    Follow up needed: patient was already contacted once this week for regularly scheduled clinical call, will not adjust dates at this time, awaiting patient call back    Approximate time spent: 10 minutes    Thad Ranger   [facility] Specialty Pharmacist

## 2019-08-12 MED FILL — MYCOPHENOLATE SODIUM 180 MG TABLET,DELAYED RELEASE: 30 days supply | Qty: 120 | Fill #2 | Status: AC

## 2019-08-12 MED FILL — TRUEPLUS PEN NEEDLE 29 GAUGE X 1/2": 100 days supply | Qty: 100 | Fill #0 | Status: AC

## 2019-08-12 MED FILL — TACROLIMUS 1 MG CAPSULE: 30 days supply | Qty: 90 | Fill #0 | Status: AC

## 2019-08-12 MED FILL — MYCOPHENOLATE SODIUM 180 MG TABLET,DELAYED RELEASE: 30 days supply | Qty: 120 | Fill #2

## 2019-08-12 MED FILL — TRUEPLUS PEN NEEDLE 29 GAUGE X 1/2" (12 MM): 100 days supply | Qty: 100 | Fill #0

## 2019-08-12 MED FILL — VICTOZA 2-PAK 0.6 MG/0.1 ML (18 MG/3 ML) SUBCUTANEOUS PEN INJECTOR: 60 days supply | Qty: 6 | Fill #0 | Status: AC

## 2019-08-19 DIAGNOSIS — Z944 Liver transplant status: Secondary | ICD-10-CM

## 2019-09-02 DIAGNOSIS — Z944 Liver transplant status: Secondary | ICD-10-CM

## 2019-09-03 NOTE — Unmapped (Signed)
Patient denied medication refills for tacrolimus and mycophenolate due to change in insurance. Pt said her medicaid card should be in by October 1st and that she had enough medicine to last her until then. Moving specialty refill call to appropriate date.

## 2019-09-11 NOTE — Unmapped (Signed)
[facility] Clinical Assessment & Refill Coordination Note    Evelyn Rollins, DOB: January 30, 1966  Phone: [phone] (home)     All above HIPAA information was verified with patient.     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 acetaminophen (TYLENOL) 325 MG tablet Take 650 mg by mouth daily as needed.     ??? amLODIPine (NORVASC) 5 MG tablet Take 5 mg by mouth daily.     ??? ARIPiprazole (ABILIFY) 5 MG tablet Take 5 mg by mouth nightly.      ??? aspirin (ECOTRIN) 81 MG tablet Take 81 mg by mouth daily.     ??? buPROPion (WELLBUTRIN XL) 300 MG 24 hr tablet Take 300 mg by mouth daily. PCP managing     ??? calcium carbonate/vitamin D3 (CALCIUM WITH VITAMIN D ORAL) Take 600 mg by mouth every other day. Calcium 600mg  and vitamin D 200units     ??? escitalopram oxalate (LEXAPRO) 20 MG tablet Take 20 mg by mouth daily. PCP managing this     ??? fluticasone propionate (FLONASE) 50 mcg/actuation nasal spray 2 sprays into each nostril.     ??? levothyroxine (SYNTHROID, LEVOTHROID) 112 MCG tablet TAKE ONE TABLET BY MOUTH ONCE DAILY     ??? liothyronine (CYTOMEL) 5 MCG tablet TAKE 1 TABLET ON AN EMPTY STOMACH X 7 DAYS, THEN INCREASE TO 2 TABS IF 1 TAB TOLERATED     ??? liraglutide (VICTOZA) injection pen Inject 0.1 mL (0.6 mg total) under the skin daily. 9 mL 3   ??? LORazepam (ATIVAN) 0.5 MG tablet Take 1 mg by mouth two (2) times a day as needed for anxiety.      ??? magnesium oxide (MAG-OX) 400 mg tablet Take 400 mg by mouth.     ??? magnesium oxide 150 mg magnesium Tab tablet Take by mouth.     ??? mycophenolate (MYFORTIC) 180 MG EC tablet Take 2 tablets (360mg ) by mouth twice daily. 120 tablet 9   ??? nystatin, bulk, 15 billion unit Powd 1 application by Miscellaneous route once as needed.     ??? pen needle, diabetic (BD ULTRA-FINE ORIG PEN NEEDLE) 29 gauge x 1/2 (12 mm) Ndle Use as directed once daily 100 each 0   ??? pravastatin (PRAVACHOL) 40 MG tablet Take 40 mg by mouth daily. PCP started and managing     ??? progesterone (PROMETRIUM) 100 MG capsule Take 100 mg by mouth.     ??? tacrolimus (PROGRAF) 1 MG capsule Take 2 capsules (2mg ) by mouth in the morning and 1 capsule (1mg ) in the evening. 120 capsule 9   ??? zolpidem (AMBIEN) 5 MG tablet Take 1 tab by mouth at bedtime       No current facility-administered medications for this visit.         Changes to medications: Deiondra reports no changes at this time.    No Known Allergies    Changes to allergies: No    SPECIALTY MEDICATION ADHERENCE     Tacrolimus 1mg   : 3 days of medicine on hand   Mycophenolate 180mg   : 3 days of medicine on hand        Medication Adherence    Patient reported X missed doses in the last month: 0  Specialty Medication: mycophenolate 180mg   Patient is on additional specialty medications: Yes  Additional Specialty Medications: Tacrolimus 1mg   Patient  Reported Additional Medication X Missed Doses in the Last Month: 0  Adherence tools used: patient uses a pill box to manage medications          Specialty medication(s) dose(s) confirmed: Regimen is correct and unchanged.     Are there any concerns with adherence? No    Adherence counseling provided? Not needed    CLINICAL MANAGEMENT AND INTERVENTION      Clinical Benefit Assessment:    Do you feel the medicine is effective or helping your condition? Yes    Clinical Benefit counseling provided? Not needed    Adverse Effects Assessment:    Are you experiencing any side effects? No    Are you experiencing difficulty administering your medicine? No    Quality of Life Assessment:    How many days over the past month did your transplant  keep you from your normal activities? For example, brushing your teeth or getting up in the morning. 0    Have you discussed this with your provider? Not needed    Therapy Appropriateness:    Is therapy appropriate? Yes, therapy is appropriate and should be continued DISEASE/MEDICATION-SPECIFIC INFORMATION      N/A    PATIENT SPECIFIC NEEDS     ? Does the patient have any physical, cognitive, or cultural barriers? No    ? Is the patient high risk? Yes, patient taking a REMS drug     ? Does the patient require a Care Management Plan? No     ? Does the patient require physician intervention or other additional services (i.e. nutrition, smoking cessation, social work)? No      SHIPPING     Specialty Medication(s) to be Shipped:   Transplant:  mycophenolic acid 180mg  and tacrolimus 1mg     Other medication(s) to be shipped: n/a     Changes to insurance: Yes: pt states medicaid is active as of 10/1. I got the info from her card from her and entered it in [place]. also linked the rxs to medicaid in work order and added work order note that medicaid is active as of 10/1 and to contact pt if any issues    Delivery Scheduled: Yes, Expected medication delivery date: 09/13/2019.     Medication will be delivered via UPS to the confirmed home address in [facility].    The patient will receive a drug information handout for each medication shipped and additional FDA Medication Guides as required.  Verified that patient has previously received a Conservation officer, historic buildings.    All of the patient's questions and concerns have been addressed.    Thad Ranger   [facility] Specialty Pharmacist

## 2019-09-12 MED FILL — MYCOPHENOLATE SODIUM 180 MG TABLET,DELAYED RELEASE: 30 days supply | Qty: 120 | Fill #3 | Status: AC

## 2019-09-12 MED FILL — TACROLIMUS 1 MG CAPSULE, IMMEDIATE-RELEASE: 30 days supply | Qty: 90 | Fill #1

## 2019-09-12 MED FILL — MYCOPHENOLATE SODIUM 180 MG TABLET,DELAYED RELEASE: 30 days supply | Qty: 120 | Fill #3

## 2019-09-12 MED FILL — TACROLIMUS 1 MG CAPSULE: 30 days supply | Qty: 90 | Fill #1 | Status: AC

## 2019-09-16 DIAGNOSIS — Z944 Liver transplant status: Secondary | ICD-10-CM

## 2019-09-30 DIAGNOSIS — Z944 Liver transplant status: Principal | ICD-10-CM

## 2019-10-02 NOTE — Unmapped (Signed)
[facility] Clinical Assessment & Refill Coordination Note    Evelyn Rollins, DOB: 27-Sep-1966  Phone: [phone] (home)     All above HIPAA information was verified with patient.     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 acetaminophen (TYLENOL) 325 MG tablet Take 650 mg by mouth daily as needed.     ??? amLODIPine (NORVASC) 5 MG tablet Take 5 mg by mouth daily.     ??? ARIPiprazole (ABILIFY) 5 MG tablet Take 5 mg by mouth nightly.      ??? aspirin (ECOTRIN) 81 MG tablet Take 81 mg by mouth daily.     ??? buPROPion (WELLBUTRIN XL) 300 MG 24 hr tablet Take 300 mg by mouth daily. PCP managing     ??? calcium carbonate/vitamin D3 (CALCIUM WITH VITAMIN D ORAL) Take 600 mg by mouth every other day. Calcium 600mg  and vitamin D 200units     ??? escitalopram oxalate (LEXAPRO) 20 MG tablet Take 20 mg by mouth daily. PCP managing this     ??? fluticasone propionate (FLONASE) 50 mcg/actuation nasal spray 2 sprays into each nostril.     ??? levothyroxine (SYNTHROID, LEVOTHROID) 112 MCG tablet TAKE ONE TABLET BY MOUTH ONCE DAILY     ??? liothyronine (CYTOMEL) 5 MCG tablet TAKE 1 TABLET ON AN EMPTY STOMACH X 7 DAYS, THEN INCREASE TO 2 TABS IF 1 TAB TOLERATED     ??? liraglutide (VICTOZA) injection pen Inject 0.1 mL (0.6 mg total) under the skin daily. 9 mL 3   ??? LORazepam (ATIVAN) 0.5 MG tablet Take 1 mg by mouth two (2) times a day as needed for anxiety.      ??? magnesium oxide (MAG-OX) 400 mg tablet Take 400 mg by mouth.     ??? magnesium oxide 150 mg magnesium Tab tablet Take by mouth.     ??? mycophenolate (MYFORTIC) 180 MG EC tablet Take 2 tablets (360mg ) by mouth twice daily. 120 tablet 9   ??? nystatin, bulk, 15 billion unit Powd 1 application by Miscellaneous route once as needed. ??? pen needle, diabetic (BD ULTRA-FINE ORIG PEN NEEDLE) 29 gauge x 1/2 (12 mm) Ndle Use as directed once daily 100 each 0   ??? pravastatin (PRAVACHOL) 40 MG tablet Take 40 mg by mouth daily. PCP started and managing     ??? progesterone (PROMETRIUM) 100 MG capsule Take 100 mg by mouth.     ??? tacrolimus (PROGRAF) 1 MG capsule Take 2 capsules (2mg ) by mouth in the morning and 1 capsule (1mg ) in the evening. 120 capsule 9   ??? zolpidem (AMBIEN) 5 MG tablet Take 1 tab by mouth at bedtime       No current facility-administered medications for this visit.         Changes to medications: Tariana reports no changes at this time.    No Known Allergies    Changes to allergies: No    SPECIALTY MEDICATION ADHERENCE     Tacrolimus 1mg   : 10 days of medicine on hand   Mycophenolate 180mg   : 10 days of medicine on hand       Medication Adherence    Patient reported X missed doses in the last month: 0  Specialty Medication: tacrolimus 1mg   Patient is on additional specialty medications: Yes  Additional Specialty Medications: Mycophenolate 180mg   Patient Reported Additional Medication X Missed  Doses in the Last Month: 0  Adherence tools used: patient uses a pill box to manage medications          Specialty medication(s) dose(s) confirmed: Regimen is correct and unchanged.     Are there any concerns with adherence? No    Adherence counseling provided? Not needed    CLINICAL MANAGEMENT AND INTERVENTION      Clinical Benefit Assessment:    Do you feel the medicine is effective or helping your condition? Yes    Clinical Benefit counseling provided? Not needed    Adverse Effects Assessment:    Are you experiencing any side effects? No    Are you experiencing difficulty administering your medicine? No    Quality of Life Assessment:    How many days over the past month did your transplant  keep you from your normal activities? For example, brushing your teeth or getting up in the morning. 0 Have you discussed this with your provider? Not needed    Therapy Appropriateness:    Is therapy appropriate? Yes, therapy is appropriate and should be continued    DISEASE/MEDICATION-SPECIFIC INFORMATION      N/A    PATIENT SPECIFIC NEEDS     ? Does the patient have any physical, cognitive, or cultural barriers? No    ? Is the patient high risk? Yes, patient taking a REMS drug     ? Does the patient require a Care Management Plan? No     ? Does the patient require physician intervention or other additional services (i.e. nutrition, smoking cessation, social work)? No      SHIPPING     Specialty Medication(s) to be Shipped:   Transplant:  mycophenolic acid 180mg  and tacrolimus 1mg     Other medication(s) to be shipped: victoza     Changes to insurance: No    Delivery Scheduled: Yes, Expected medication delivery date: 10/09/2019.     Medication will be delivered via UPS to the confirmed home address in [facility].    The patient will receive a drug information handout for each medication shipped and additional FDA Medication Guides as required.  Verified that patient has previously received a Conservation officer, historic buildings.    All of the patient's questions and concerns have been addressed.    Thad Ranger   [facility] Specialty Pharmacist

## 2019-10-08 DIAGNOSIS — E669 Obesity, unspecified: Principal | ICD-10-CM

## 2019-10-08 MED FILL — VICTOZA 2-PAK 0.6 MG/0.1 ML (18 MG/3 ML) SUBCUTANEOUS PEN INJECTOR: 60 days supply | Qty: 6 | Fill #1 | Status: AC

## 2019-10-08 MED FILL — VICTOZA 2-PAK 0.6 MG/0.1 ML (18 MG/3 ML) SUBCUTANEOUS PEN INJECTOR: SUBCUTANEOUS | 60 days supply | Qty: 6 | Fill #1

## 2019-10-08 MED FILL — MYCOPHENOLATE SODIUM 180 MG TABLET,DELAYED RELEASE: 30 days supply | Qty: 120 | Fill #4 | Status: AC

## 2019-10-08 MED FILL — TACROLIMUS 1 MG CAPSULE: 30 days supply | Qty: 90 | Fill #2 | Status: AC

## 2019-10-08 MED FILL — MYCOPHENOLATE SODIUM 180 MG TABLET,DELAYED RELEASE: 30 days supply | Qty: 120 | Fill #4

## 2019-10-08 MED FILL — TACROLIMUS 1 MG CAPSULE, IMMEDIATE-RELEASE: 30 days supply | Qty: 90 | Fill #2

## 2019-10-14 DIAGNOSIS — Z944 Liver transplant status: Principal | ICD-10-CM

## 2019-10-19 LAB — CBC W/ DIFFERENTIAL
BASOPHILS ABSOLUTE COUNT: 0.1 10*3/uL (ref 0.0–0.2)
BASOPHILS RELATIVE PERCENT: 1 %
EOSINOPHILS ABSOLUTE COUNT: 0.3 10*3/uL (ref 0.0–0.4)
HEMATOCRIT: 42.1 % (ref 34.0–46.6)
HEMOGLOBIN: 13.8 g/dL (ref 11.1–15.9)
IMMATURE GRANULOCYTES: 1 %
LYMPHOCYTES ABSOLUTE COUNT: 3.2 10*3/uL — ABNORMAL HIGH (ref 0.7–3.1)
LYMPHOCYTES RELATIVE PERCENT: 40 %
MEAN CORPUSCULAR HEMOGLOBIN CONC: 32.8 g/dL (ref 31.5–35.7)
MEAN CORPUSCULAR HEMOGLOBIN: 29 pg (ref 26.6–33.0)
MEAN CORPUSCULAR VOLUME: 88 fL (ref 79–97)
MONOCYTES ABSOLUTE COUNT: 0.6 10*3/uL (ref 0.1–0.9)
MONOCYTES RELATIVE PERCENT: 7 %
NEUTROPHILS ABSOLUTE COUNT: 3.8 10*3/uL (ref 1.4–7.0)
NEUTROPHILS RELATIVE PERCENT: 48 %
RED BLOOD CELL COUNT: 4.76 x10E6/uL (ref 3.77–5.28)
RED CELL DISTRIBUTION WIDTH: 13.2 % (ref 11.7–15.4)
WHITE BLOOD CELL COUNT: 8 10*3/uL (ref 3.4–10.8)

## 2019-10-19 LAB — COMPREHENSIVE METABOLIC PANEL
A/G RATIO: 1.8 (ref 1.2–2.2)
ALBUMIN: 4.2 g/dL (ref 3.8–4.9)
ALKALINE PHOSPHATASE: 91 IU/L (ref 39–117)
AST (SGOT): 30 IU/L (ref 0–40)
BILIRUBIN TOTAL: 0.3 mg/dL (ref 0.0–1.2)
BLOOD UREA NITROGEN: 18 mg/dL (ref 6–24)
BUN / CREAT RATIO: 18 (ref 9–23)
CALCIUM: 9.2 mg/dL (ref 8.7–10.2)
CO2: 21 mmol/L (ref 20–29)
CREATININE: 1 mg/dL (ref 0.57–1.00)
GFR MDRD AF AMER: 74 mL/min/{1.73_m2}
GFR MDRD NON AF AMER: 64 mL/min/{1.73_m2}
GLOBULIN, TOTAL: 2.3 g/dL (ref 1.5–4.5)
GLUCOSE: 102 mg/dL — ABNORMAL HIGH (ref 65–99)
POTASSIUM: 4.7 mmol/L (ref 3.5–5.2)
SODIUM: 141 mmol/L (ref 134–144)
TOTAL PROTEIN: 6.5 g/dL (ref 6.0–8.5)

## 2019-10-19 LAB — GAMMA GLUTAMYL TRANSFERASE: Gamma glutamyl transferase:CCnc:Pt:Ser/Plas:Qn:: 20

## 2019-10-19 LAB — BILIRUBIN DIRECT: Bilirubin.glucuronidated+Bilirubin.albumin bound:MCnc:Pt:Ser/Plas:Qn:: 0.1

## 2019-10-19 LAB — POTASSIUM: Potassium:SCnc:Pt:Ser/Plas:Qn:: 4.7

## 2019-10-19 LAB — MEAN CORPUSCULAR VOLUME: Erythrocyte mean corpuscular volume:EntVol:Pt:RBC:Qn:Automated count: 88

## 2019-10-19 LAB — MAGNESIUM: Magnesium:MCnc:Pt:Ser/Plas:Qn:: 1.9

## 2019-10-19 LAB — PHOSPHORUS, SERUM: Phosphate:MCnc:Pt:Ser/Plas:Qn:: 2.6 — ABNORMAL LOW

## 2019-10-20 LAB — TACROLIMUS BLOOD: Tacrolimus:MCnc:Pt:Bld:Qn:LC/MS/MS: 3.4

## 2019-10-21 NOTE — Unmapped (Signed)
Called patient to discuss her ALT of 58 and patient feels fine but very stressed. Mentioned she was told she does not have disability but then was told she has medicaid; she has hired a Clinical research associate to help her. Also mentioned she has been trying to move her mom out of the house and having to go through a lot of stuff and find a new house for her mom.   Patient apologized for not getting labs since provider decreased her tac to 2/1 in August. Confirmed she was taking the Victoza that was started for both weight loss/BG control and reduction of hepatic steatosis burden but has not followed up with her PCP about starting this and to determine if to remain on it past the 3 months prescribed to her.     Verbalized understanding to follow up with PCP about starting victoza and management for this; if continues, PCP to send script to pharmacy. Also to get labs in beginning of December.     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 Discussed these labs and past ones with Johnny Bridge.   Johnny Bridge was fine with patient getting her labs again in the beginning of December, fine with tac level of 3.4 and patient could have tac goal 3-5.

## 2019-10-28 DIAGNOSIS — Z944 Liver transplant status: Principal | ICD-10-CM

## 2019-10-31 NOTE — Unmapped (Signed)
[facility] Refill Coordination Note    Specialty Medication(s) to be Shipped:   Transplant: mycophenolate mofetil 180mg  and tacrolimus 1mg     Other medication(s) to be shipped: N/A     Evelyn Rollins, DOB: [birth year]  Phone: [phone] (home)       All above HIPAA information was verified with patient.     Completed refill call assessment today to schedule patient's medication shipment from the [facility] [phone]).       Specialty medication(s) and dose(s) confirmed: Regimen is correct and unchanged.   Changes to medications: Deaun reports no changes at this time.  Changes to insurance: No  Questions for the pharmacist: No    Confirmed patient received Welcome Packet with first shipment. The patient will receive a drug information handout for each medication shipped and additional FDA Medication Guides as required.       DISEASE/MEDICATION-SPECIFIC INFORMATION        N/A    SPECIALTY MEDICATION ADHERENCE     Medication Adherence    Patient reported X missed doses in the last month: 0  Specialty Medication: Mycophenolate 180mg   Patient is on additional specialty medications: Yes  Additional Specialty Medications: Tacrolimus 1mg   Patient Reported Additional Medication X Missed Doses in the Last Month: 0  Patient is on more than two specialty medications: No  Adherence tools used: patient uses a pill box to manage medications          Mycophenolate 180 mg: 8 days of medicine on hand   Tacrolimus 1 mg: 8 days of medicine on hand     SHIPPING     Shipping address confirmed in Epic.     Delivery Scheduled: Yes, Expected medication delivery date: 11/05/2019.     Medication will be delivered via UPS to the prescription address in Epic [facility].    Lorelei Pont [facility] Specialty Technician

## 2019-11-04 MED FILL — TACROLIMUS 1 MG CAPSULE, IMMEDIATE-RELEASE: 30 days supply | Qty: 90 | Fill #3

## 2019-11-04 MED FILL — MYCOPHENOLATE SODIUM 180 MG TABLET,DELAYED RELEASE: 30 days supply | Qty: 120 | Fill #5 | Status: AC

## 2019-11-04 MED FILL — TACROLIMUS 1 MG CAPSULE: 30 days supply | Qty: 90 | Fill #3 | Status: AC

## 2019-11-04 MED FILL — MYCOPHENOLATE SODIUM 180 MG TABLET,DELAYED RELEASE: 30 days supply | Qty: 120 | Fill #5

## 2019-11-22 LAB — COMPREHENSIVE METABOLIC PANEL
A/G RATIO: 1.6 (ref 1.2–2.2)
ALBUMIN: 4.1 g/dL (ref 3.8–4.9)
AST (SGOT): 27 IU/L (ref 0–40)
BILIRUBIN TOTAL: 0.4 mg/dL (ref 0.0–1.2)
BLOOD UREA NITROGEN: 15 mg/dL (ref 6–24)
BUN / CREAT RATIO: 14 (ref 9–23)
CALCIUM: 8.8 mg/dL (ref 8.7–10.2)
CHLORIDE: 104 mmol/L (ref 96–106)
CO2: 20 mmol/L (ref 20–29)
CREATININE: 1.05 mg/dL — ABNORMAL HIGH (ref 0.57–1.00)
GFR MDRD AF AMER: 70 mL/min/{1.73_m2}
GFR MDRD NON AF AMER: 61 mL/min/{1.73_m2}
GLOBULIN, TOTAL: 2.5 g/dL (ref 1.5–4.5)
POTASSIUM: 4.2 mmol/L (ref 3.5–5.2)
SODIUM: 139 mmol/L (ref 134–144)
TOTAL PROTEIN: 6.6 g/dL (ref 6.0–8.5)

## 2019-11-22 LAB — BILIRUBIN DIRECT: Bilirubin.glucuronidated+Bilirubin.albumin bound:MCnc:Pt:Ser/Plas:Qn:: 0.12

## 2019-11-22 LAB — CBC W/ DIFFERENTIAL
BANDED NEUTROPHILS ABSOLUTE COUNT: 0.1 10*3/uL (ref 0.0–0.1)
BASOPHILS ABSOLUTE COUNT: 0.1 10*3/uL (ref 0.0–0.2)
BASOPHILS RELATIVE PERCENT: 1 %
EOSINOPHILS ABSOLUTE COUNT: 0.2 10*3/uL (ref 0.0–0.4)
EOSINOPHILS RELATIVE PERCENT: 3 %
HEMOGLOBIN: 13.5 g/dL (ref 11.1–15.9)
IMMATURE GRANULOCYTES: 1 %
LYMPHOCYTES RELATIVE PERCENT: 34 %
MEAN CORPUSCULAR HEMOGLOBIN CONC: 33.9 g/dL (ref 31.5–35.7)
MEAN CORPUSCULAR HEMOGLOBIN: 29.2 pg (ref 26.6–33.0)
MONOCYTES ABSOLUTE COUNT: 0.6 10*3/uL (ref 0.1–0.9)
MONOCYTES RELATIVE PERCENT: 8 %
NEUTROPHILS ABSOLUTE COUNT: 4.5 10*3/uL (ref 1.4–7.0)
NEUTROPHILS RELATIVE PERCENT: 53 %
PLATELET COUNT: 224 10*3/uL (ref 150–450)
RED BLOOD CELL COUNT: 4.63 x10E6/uL (ref 3.77–5.28)
RED CELL DISTRIBUTION WIDTH: 13.1 % (ref 11.7–15.4)
WHITE BLOOD CELL COUNT: 8.4 10*3/uL (ref 3.4–10.8)

## 2019-11-22 LAB — PHOSPHORUS, SERUM: Phosphate:MCnc:Pt:Ser/Plas:Qn:: 2.5 — ABNORMAL LOW

## 2019-11-22 LAB — EOSINOPHILS RELATIVE PERCENT: Eosinophils/100 leukocytes:NFr:Pt:Bld:Qn:Automated count: 3

## 2019-11-22 LAB — ALBUMIN: Albumin:MCnc:Pt:Ser/Plas:Qn:: 4.1

## 2019-11-22 LAB — MAGNESIUM: Magnesium:MCnc:Pt:Ser/Plas:Qn:: 1.9

## 2019-11-22 LAB — GAMMA GLUTAMYL TRANSFERASE: Gamma glutamyl transferase:CCnc:Pt:Ser/Plas:Qn:: 22

## 2019-11-24 LAB — TACROLIMUS BLOOD: Tacrolimus:MCnc:Pt:Bld:Qn:LC/MS/MS: 1.9 — ABNORMAL LOW

## 2019-11-25 DIAGNOSIS — Z944 Liver transplant status: Principal | ICD-10-CM

## 2019-11-26 DIAGNOSIS — Z944 Liver transplant status: Principal | ICD-10-CM

## 2019-11-26 DIAGNOSIS — E669 Obesity, unspecified: Principal | ICD-10-CM

## 2019-11-26 DIAGNOSIS — R739 Hyperglycemia, unspecified: Principal | ICD-10-CM

## 2019-11-26 DIAGNOSIS — K760 Fatty (change of) liver, not elsewhere classified: Principal | ICD-10-CM

## 2019-11-26 NOTE — Unmapped (Signed)
[facility] Pharm[facility] Refill Coordination Note    Specialty Medication(s) to be Shipped:   Transplant: mycophenolate mofetil 180mg  and tacrolimus 1mg     Other medication(s) to be shipped: pen needles (sent rf request), nystatin powder (sent rf request), and victoza     Evelyn Rollins, DOB: [birth year]  Phone: [phone] (home)       All above HIPAA information was verified with patient.     Was a Nurse, learning disability used for this call? No    Completed refill call assessment today to schedule patient's medication shipment from the [facility] [phone]).       Specialty medication(s) and dose(s) confirmed: Regimen is correct and unchanged.   Changes to medications: Sofya reports no changes at this time.  Changes to insurance: No  Questions for the pharmacist: No    Confirmed patient received Welcome Packet with first shipment. The patient will receive a drug information handout for each medication shipped and additional FDA Medication Guides as required.       DISEASE/MEDICATION-SPECIFIC INFORMATION        N/A    SPECIALTY MEDICATION ADHERENCE     Medication Adherence    Patient reported X missed doses in the last month: 0  Specialty Medication: Mycophenolate 180mg   Patient is on additional specialty medications: Yes  Additional Specialty Medications: Tacrolimus 1mg   Patient Reported Additional Medication X Missed Doses in the Last Month: 0  Patient is on more than two specialty medications: No  Adherence tools used: patient uses a pill box to manage medications          Mycophenolate 180 mg: 5 days of medicine on hand   Tacrolimus 1 mg: 5 days of medicine on hand     SHIPPING     Shipping address confirmed in Epic.     Delivery Scheduled: Yes, Expected medication delivery date: 11/29/2019.     Medication will be delivered via UPS to the prescription address in Epic WAM.    Lorelei Pont [facility] Specialty Technician

## 2019-11-27 NOTE — Unmapped (Signed)
Called patient about her 12/10 labs and low tac level of 1.9 and she was not sure if she had missed doses or not; thought she may have missed 1-2 nights worth but the way she does her pillbox and already filled them up, she was not sure.     Verbalized understanding to repeat labs the week of Dec 28 to ensure her labs are stable and the tac level goes back up.

## 2019-11-27 NOTE — Unmapped (Signed)
Received refill for diabetic needles. Pt's PCP manages these supplies and will route to [facility] pharmacy.  Lowella Grip, NP PCP - General  05/11/2016 End  05/11/16   Phone: [phone]; Fax: [phone]

## 2019-11-28 MED FILL — VICTOZA 2-PAK 0.6 MG/0.1 ML (18 MG/3 ML) SUBCUTANEOUS PEN INJECTOR: SUBCUTANEOUS | 60 days supply | Qty: 6 | Fill #2

## 2019-11-28 MED FILL — TACROLIMUS 1 MG CAPSULE: 30 days supply | Qty: 90 | Fill #4 | Status: AC

## 2019-11-28 MED FILL — VICTOZA 2-PAK 0.6 MG/0.1 ML (18 MG/3 ML) SUBCUTANEOUS PEN INJECTOR: 60 days supply | Qty: 6 | Fill #2 | Status: AC

## 2019-11-28 MED FILL — TACROLIMUS 1 MG CAPSULE, IMMEDIATE-RELEASE: 30 days supply | Qty: 90 | Fill #4

## 2019-11-28 MED FILL — MYCOPHENOLATE SODIUM 180 MG TABLET,DELAYED RELEASE: 30 days supply | Qty: 120 | Fill #6

## 2019-11-28 MED FILL — MYCOPHENOLATE SODIUM 180 MG TABLET,DELAYED RELEASE: 30 days supply | Qty: 120 | Fill #6 | Status: AC

## 2019-12-09 DIAGNOSIS — Z944 Liver transplant status: Principal | ICD-10-CM

## 2019-12-10 LAB — CBC W/ DIFFERENTIAL
BANDED NEUTROPHILS ABSOLUTE COUNT: 0.1 10*3/uL (ref 0.0–0.1)
BASOPHILS ABSOLUTE COUNT: 0.1 10*3/uL (ref 0.0–0.2)
BASOPHILS RELATIVE PERCENT: 1 %
EOSINOPHILS ABSOLUTE COUNT: 0.2 10*3/uL (ref 0.0–0.4)
EOSINOPHILS RELATIVE PERCENT: 3 %
HEMATOCRIT: 40.6 % (ref 34.0–46.6)
HEMOGLOBIN: 13.5 g/dL (ref 11.1–15.9)
IMMATURE GRANULOCYTES: 1 %
LYMPHOCYTES ABSOLUTE COUNT: 2.8 10*3/uL (ref 0.7–3.1)
LYMPHOCYTES RELATIVE PERCENT: 33 %
MEAN CORPUSCULAR HEMOGLOBIN CONC: 33.3 g/dL (ref 31.5–35.7)
MEAN CORPUSCULAR HEMOGLOBIN: 29.2 pg (ref 26.6–33.0)
MEAN CORPUSCULAR VOLUME: 88 fL (ref 79–97)
MONOCYTES ABSOLUTE COUNT: 0.6 10*3/uL (ref 0.1–0.9)
NEUTROPHILS ABSOLUTE COUNT: 4.7 10*3/uL (ref 1.4–7.0)
NEUTROPHILS RELATIVE PERCENT: 55 %
PLATELET COUNT: 254 10*3/uL (ref 150–450)
RED BLOOD CELL COUNT: 4.62 x10E6/uL (ref 3.77–5.28)
WHITE BLOOD CELL COUNT: 8.4 10*3/uL (ref 3.4–10.8)

## 2019-12-10 LAB — COMPREHENSIVE METABOLIC PANEL
A/G RATIO: 1.8 (ref 1.2–2.2)
ALBUMIN: 4.3 g/dL (ref 3.8–4.9)
ALKALINE PHOSPHATASE: 97 IU/L (ref 39–117)
BLOOD UREA NITROGEN: 18 mg/dL (ref 6–24)
BUN / CREAT RATIO: 18 (ref 9–23)
CALCIUM: 9.3 mg/dL (ref 8.7–10.2)
CHLORIDE: 106 mmol/L (ref 96–106)
CO2: 21 mmol/L (ref 20–29)
CREATININE: 1.02 mg/dL — ABNORMAL HIGH (ref 0.57–1.00)
GFR MDRD AF AMER: 73 mL/min/{1.73_m2}
GFR MDRD NON AF AMER: 63 mL/min/{1.73_m2}
GLOBULIN, TOTAL: 2.4 g/dL (ref 1.5–4.5)
GLUCOSE: 108 mg/dL — ABNORMAL HIGH (ref 65–99)
POTASSIUM: 4.4 mmol/L (ref 3.5–5.2)
SODIUM: 143 mmol/L (ref 134–144)
TOTAL PROTEIN: 6.7 g/dL (ref 6.0–8.5)

## 2019-12-10 LAB — PHOSPHORUS, SERUM: Phosphate:MCnc:Pt:Ser/Plas:Qn:: 3.2

## 2019-12-10 LAB — GAMMA GLUTAMYL TRANSFERASE: Gamma glutamyl transferase:CCnc:Pt:Ser/Plas:Qn:: 21

## 2019-12-10 LAB — BANDED NEUTROPHILS ABSOLUTE COUNT: Granulocytes.immature:NCnc:Pt:Bld:Qn:Automated count: 0.1

## 2019-12-10 LAB — BILIRUBIN DIRECT: Bilirubin.glucuronidated+Bilirubin.albumin bound:MCnc:Pt:Ser/Plas:Qn:: 0.12

## 2019-12-10 LAB — MAGNESIUM: Magnesium:MCnc:Pt:Ser/Plas:Qn:: 1.9

## 2019-12-10 LAB — TOTAL PROTEIN: Protein:MCnc:Pt:Ser/Plas:Qn:: 6.7

## 2019-12-11 LAB — TACROLIMUS LEVEL: TACROLIMUS BLOOD: 4 ng/mL (ref 2.0–20.0)

## 2019-12-11 LAB — TACROLIMUS BLOOD: Tacrolimus:MCnc:Pt:Bld:Qn:LC/MS/MS: 4

## 2019-12-18 DIAGNOSIS — Z944 Liver transplant status: Principal | ICD-10-CM

## 2019-12-18 DIAGNOSIS — R739 Hyperglycemia, unspecified: Principal | ICD-10-CM

## 2019-12-18 DIAGNOSIS — E669 Obesity, unspecified: Principal | ICD-10-CM

## 2019-12-18 DIAGNOSIS — K760 Fatty (change of) liver, not elsewhere classified: Principal | ICD-10-CM

## 2019-12-18 NOTE — Unmapped (Signed)
Received refill for diabetic needles. Pt's PCP manages these supplies and will route to [facility] pharmacy.  Lowella Grip, NP PCP - General ?? 05/11/2016 End ?? 05/11/16   Phone: [phone]; Fax: [phone]      ??  Patient is aware from previous conversations PCP is managing this.

## 2019-12-18 NOTE — Unmapped (Addendum)
1/6 update: txp clinic denied pen needle and nystatin refills as these should now come from PCP. I tried to contact PCP but was unable to reach anyone or leave message at office for refills. I called patient and let her know this - she will reach out to her PCP -Evelyn Rollins      [facility] Clinical Assessment & Refill Coordination Note    Evelyn Rollins, DOB: 08-16-1966  Phone: [phone] (home)     All above HIPAA information was verified with patient.     Was a Nurse, learning disability used for this call? No    Specialty Medication(s):   Transplant:  mycophenolic acid 180mg  and tacrolimus 1mg      Current Outpatient Medications   Medication Sig Dispense Refill   ??? acetaminophen (TYLENOL) 325 MG tablet Take 650 mg by mouth daily as needed.     ??? amLODIPine (NORVASC) 5 MG tablet Take 5 mg by mouth daily.     ??? ARIPiprazole (ABILIFY) 5 MG tablet Take 5 mg by mouth nightly.      ??? aspirin (ECOTRIN) 81 MG tablet Take 81 mg by mouth daily.     ??? buPROPion (WELLBUTRIN XL) 300 MG 24 hr tablet Take 300 mg by mouth daily. PCP managing     ??? calcium carbonate/vitamin D3 (CALCIUM WITH VITAMIN D ORAL) Take 600 mg by mouth every other day. Calcium 600mg  and vitamin D 200units     ??? escitalopram oxalate (LEXAPRO) 20 MG tablet Take 20 mg by mouth daily. PCP managing this     ??? fluticasone propionate (FLONASE) 50 mcg/actuation nasal spray 2 sprays into each nostril.     ??? levothyroxine (SYNTHROID, LEVOTHROID) 112 MCG tablet TAKE ONE TABLET BY MOUTH ONCE DAILY     ??? liothyronine (CYTOMEL) 5 MCG tablet TAKE 1 TABLET ON AN EMPTY STOMACH X 7 DAYS, THEN INCREASE TO 2 TABS IF 1 TAB TOLERATED     ??? liraglutide (VICTOZA) injection pen Inject 0.1 mL (0.6 mg total) under the skin daily. 9 mL 3   ??? LORazepam (ATIVAN) 0.5 MG tablet Take 1 mg by mouth two (2) times a day as needed for anxiety.      ??? magnesium oxide (MAG-OX) 400 mg tablet Take 400 mg by mouth.     ??? magnesium oxide 150 mg magnesium Tab tablet Take by mouth. ??? mycophenolate (MYFORTIC) 180 MG EC tablet Take 2 tablets (360mg ) by mouth twice daily. 120 tablet 9   ??? nystatin, bulk, 15 billion unit Powd 1 application by Miscellaneous route once as needed.     ??? pravastatin (PRAVACHOL) 40 MG tablet Take 40 mg by mouth daily. PCP started and managing     ??? progesterone (PROMETRIUM) 100 MG capsule Take 100 mg by mouth.     ??? tacrolimus (PROGRAF) 1 MG capsule Take 2 capsules (2mg ) by mouth in the morning and 1 capsule (1mg ) in the evening. 120 capsule 9   ??? zolpidem (AMBIEN) 5 MG tablet Take 1 tab by mouth at bedtime       No current facility-administered medications for this visit.         Changes to medications: Evelyn Rollins reports no changes at this time.    No Known Allergies    Changes to allergies: No    SPECIALTY MEDICATION ADHERENCE     Mycophenolate 180mg   : 10 days of medicine on hand   Tacrolimus 1mg   : 10 days of medicine on hand  Medication Adherence    Patient reported X missed doses in the last month: 0  Specialty Medication: mycophenolate 180mg   Patient is on additional specialty medications: Yes  Additional Specialty Medications: Tacrolimus 1mg   Patient Reported Additional Medication X Missed Doses in the Last Month: 0  Adherence tools used: patient uses a pill box to manage medications          Specialty medication(s) dose(s) confirmed: Regimen is correct and unchanged.     Are there any concerns with adherence? No    Adherence counseling provided? Not needed    CLINICAL MANAGEMENT AND INTERVENTION      Clinical Benefit Assessment:    Do you feel the medicine is effective or helping your condition? Yes    Clinical Benefit counseling provided? Not needed    Adverse Effects Assessment:    Are you experiencing any side effects? No    Are you experiencing difficulty administering your medicine? No    Quality of Life Assessment: How many days over the past month did your transplant  keep you from your normal activities? For example, brushing your teeth or getting up in the morning. 0    Have you discussed this with your provider? Not needed    Therapy Appropriateness:    Is therapy appropriate? Yes, therapy is appropriate and should be continued    DISEASE/MEDICATION-SPECIFIC INFORMATION      N/A    PATIENT SPECIFIC NEEDS     ? Does the patient have any physical, cognitive, or cultural barriers? No    ? Is the patient high risk? Yes, patient is taking a REMS drug. Medication is dispensed in compliance with REMS program.     ? Does the patient require a Care Management Plan? No     ? Does the patient require physician intervention or other additional services (i.e. nutrition, smoking cessation, social work)? No      SHIPPING     Specialty Medication(s) to be Shipped:   Transplant:  mycophenolic acid 180mg  and tacrolimus 1mg     Other medication(s) to be shipped: pen needle, nystatin     Changes to insurance: No    Delivery Scheduled: Yes, Expected medication delivery date: 12/25/2019.  However, Rx request for refills was sent to the provider as there are none remaining.     Medication will be delivered via UPS to the confirmed prescription address in [facility].    The patient will receive a drug information handout for each medication shipped and additional FDA Medication Guides as required.  Verified that patient has previously received a Conservation officer, historic buildings.    All of the patient's questions and concerns have been addressed.    Thad Ranger   [facility] Specialty Pharmacist

## 2019-12-20 MED ORDER — PEN NEEDLE, DIABETIC 29 GAUGE X 1/2" (12 MM)
Freq: Every day | 11 refills | 0.00000 days
Start: 2019-12-20 — End: ?

## 2019-12-23 DIAGNOSIS — Z944 Liver transplant status: Principal | ICD-10-CM

## 2019-12-24 DIAGNOSIS — Z944 Liver transplant status: Principal | ICD-10-CM

## 2019-12-24 NOTE — Unmapped (Signed)
Evelyn Rollins 's ENTIRE shipment will be delayed as a result of prior authorization being required by the patient's insurance.     I have reached out to the patient and communicated the delay. We will call the patient back to reschedule the delivery upon resolution. We have not confirmed the new delivery date.

## 2019-12-25 MED FILL — TRUEPLUS PEN NEEDLE 29 GAUGE X 1/2" (12 MM): 90 days supply | Qty: 100 | Fill #0

## 2019-12-25 MED FILL — TACROLIMUS 1 MG CAPSULE, IMMEDIATE-RELEASE: 30 days supply | Qty: 90 | Fill #5

## 2019-12-25 MED FILL — TRUEPLUS PEN NEEDLE 29 GAUGE X 1/2" (12 MM): 90 days supply | Qty: 100 | Fill #0 | Status: AC

## 2019-12-25 MED FILL — MYCOPHENOLATE SODIUM 180 MG TABLET,DELAYED RELEASE: 30 days supply | Qty: 120 | Fill #7

## 2019-12-25 MED FILL — MYCOPHENOLATE SODIUM 180 MG TABLET,DELAYED RELEASE: 30 days supply | Qty: 120 | Fill #7 | Status: AC

## 2019-12-25 MED FILL — TACROLIMUS 1 MG CAPSULE: 30 days supply | Qty: 90 | Fill #5 | Status: AC

## 2019-12-25 NOTE — Unmapped (Signed)
Evelyn Rollins 's MYCOPHENOLATE, TAC AND PEN NEEDLES shipment will be sent out  as a result of prior authorization now approved.     I have reached out to the patient and communicated the delivery change. We will reschedule the medication for the delivery date that the patient agreed upon.  We have confirmed the delivery date as 12/26/19 VIA UPS

## 2020-01-06 DIAGNOSIS — Z944 Liver transplant status: Principal | ICD-10-CM

## 2020-01-13 NOTE — Unmapped (Signed)
[facility] Refill Coordination Note    Specialty Medication(s) to be Shipped:   Transplant: mycophenolate mofetil 180mg  and tacrolimus 1mg     Other medication(s) to be shipped: N/A     Evelyn Rollins, DOB: Dec 11, 1966  Phone: [phone] (home)       All above HIPAA information was verified with patient.     Was a Nurse, learning disability used for this call? No    Completed refill call assessment today to schedule patient's medication shipment from the [facility] [phone]).       Specialty medication(s) and dose(s) confirmed: Regimen is correct and unchanged.   Changes to medications: Evelyn Rollins reports no changes at this time.  Changes to insurance: No  Questions for the pharmacist: No    Confirmed patient received Welcome Packet with first shipment. The patient will receive a drug information handout for each medication shipped and additional FDA Medication Guides as required.       DISEASE/MEDICATION-SPECIFIC INFORMATION        N/A    SPECIALTY MEDICATION ADHERENCE     Medication Adherence    Patient reported X missed doses in the last month: 0  Specialty Medication: Mycophenolate 180mg   Patient is on additional specialty medications: Yes  Additional Specialty Medications: Tacrolimus 1mg   Patient Reported Additional Medication X Missed Doses in the Last Month: 0  Patient is on more than two specialty medications: No  Adherence tools used: patient uses a pill box to manage medications          Mycophenolate 180 mg: 14 days of medicine on hand   Tacrolimus 1 mg: 14 days of medicine on hand     SHIPPING     Shipping address confirmed in Epic.     Delivery Scheduled: Yes, Expected medication delivery date: 01/27/2020.     Medication will be delivered via UPS to the prescription address in Epic WAM.    Lorelei Pont [facility] Specialty Technician

## 2020-01-20 DIAGNOSIS — Z944 Liver transplant status: Principal | ICD-10-CM

## 2020-01-24 MED FILL — MYCOPHENOLATE SODIUM 180 MG TABLET,DELAYED RELEASE: 30 days supply | Qty: 120 | Fill #8 | Status: AC

## 2020-01-24 MED FILL — TACROLIMUS 1 MG CAPSULE, IMMEDIATE-RELEASE: 30 days supply | Qty: 90 | Fill #6

## 2020-01-24 MED FILL — TACROLIMUS 1 MG CAPSULE: 30 days supply | Qty: 90 | Fill #6 | Status: AC

## 2020-01-24 MED FILL — MYCOPHENOLATE SODIUM 180 MG TABLET,DELAYED RELEASE: 30 days supply | Qty: 120 | Fill #8

## 2020-02-03 DIAGNOSIS — Z944 Liver transplant status: Principal | ICD-10-CM

## 2020-02-05 LAB — BILIRUBIN DIRECT: Bilirubin.glucuronidated+Bilirubin.albumin bound:MCnc:Pt:Ser/Plas:Qn:: 0.11

## 2020-02-05 LAB — CBC W/ DIFFERENTIAL
BANDED NEUTROPHILS ABSOLUTE COUNT: 0.1 10*3/uL (ref 0.0–0.1)
BASOPHILS ABSOLUTE COUNT: 0.1 10*3/uL (ref 0.0–0.2)
BASOPHILS RELATIVE PERCENT: 1 %
EOSINOPHILS ABSOLUTE COUNT: 0.3 10*3/uL (ref 0.0–0.4)
HEMATOCRIT: 40.4 % (ref 34.0–46.6)
HEMOGLOBIN: 13.4 g/dL (ref 11.1–15.9)
LYMPHOCYTES ABSOLUTE COUNT: 3 10*3/uL (ref 0.7–3.1)
LYMPHOCYTES RELATIVE PERCENT: 34 %
MEAN CORPUSCULAR HEMOGLOBIN CONC: 33.2 g/dL (ref 31.5–35.7)
MEAN CORPUSCULAR HEMOGLOBIN: 29.3 pg (ref 26.6–33.0)
MEAN CORPUSCULAR VOLUME: 88 fL (ref 79–97)
MONOCYTES RELATIVE PERCENT: 7 %
NEUTROPHILS ABSOLUTE COUNT: 4.6 10*3/uL (ref 1.4–7.0)
NEUTROPHILS RELATIVE PERCENT: 54 %
PLATELET COUNT: 220 10*3/uL (ref 150–450)
RED BLOOD CELL COUNT: 4.58 x10E6/uL (ref 3.77–5.28)
RED CELL DISTRIBUTION WIDTH: 13.7 % (ref 11.7–15.4)
WHITE BLOOD CELL COUNT: 8.6 10*3/uL (ref 3.4–10.8)

## 2020-02-05 LAB — COMPREHENSIVE METABOLIC PANEL
A/G RATIO: 1.4 (ref 1.2–2.2)
ALBUMIN: 4 g/dL (ref 3.8–4.9)
ALKALINE PHOSPHATASE: 98 IU/L (ref 39–117)
ALT (SGPT): 66 IU/L — ABNORMAL HIGH (ref 0–32)
AST (SGOT): 46 IU/L — ABNORMAL HIGH (ref 0–40)
BILIRUBIN TOTAL: 0.4 mg/dL (ref 0.0–1.2)
BUN / CREAT RATIO: 14 (ref 9–23)
CALCIUM: 9.2 mg/dL (ref 8.7–10.2)
CHLORIDE: 106 mmol/L (ref 96–106)
CO2: 20 mmol/L (ref 20–29)
CREATININE: 0.95 mg/dL (ref 0.57–1.00)
GFR MDRD AF AMER: 79 mL/min/{1.73_m2}
GFR MDRD NON AF AMER: 69 mL/min/{1.73_m2}
GLOBULIN, TOTAL: 2.9 g/dL (ref 1.5–4.5)
GLUCOSE: 115 mg/dL — ABNORMAL HIGH (ref 65–99)
POTASSIUM: 4.6 mmol/L (ref 3.5–5.2)
SODIUM: 141 mmol/L (ref 134–144)

## 2020-02-05 LAB — NEUTROPHILS RELATIVE PERCENT: Neutrophils/100 leukocytes:NFr:Pt:Bld:Qn:Automated count: 54

## 2020-02-05 LAB — MAGNESIUM: Magnesium:MCnc:Pt:Ser/Plas:Qn:: 1.8

## 2020-02-05 LAB — GAMMA GLUTAMYL TRANSFERASE: Gamma glutamyl transferase:CCnc:Pt:Ser/Plas:Qn:: 25

## 2020-02-05 LAB — CO2: Carbon dioxide:SCnc:Pt:Ser/Plas:Qn:: 20

## 2020-02-05 LAB — PHOSPHORUS, SERUM: Phosphate:MCnc:Pt:Ser/Plas:Qn:: 2.8 — ABNORMAL LOW

## 2020-02-06 LAB — TACROLIMUS BLOOD: Tacrolimus:MCnc:Pt:Bld:Qn:LC/MS/MS: 3.8

## 2020-02-07 NOTE — Unmapped (Signed)
Called patient to discuss her 02/04/2020 labs and that her AST and ALT are slightly up in comparison to her December labs.  Verbalized understanding to repeat labs in 2 weeks which will be her March labs to ensure her labs are stable.     Patient mentioned her mom is currently living with her until her house is completed which will be in the next couple of weeks; her mom's house sold quickly and mentioned it was tough/stressful going through her dad's belongings and having to move her mom out of her house being an only child.

## 2020-02-17 DIAGNOSIS — Z944 Liver transplant status: Principal | ICD-10-CM

## 2020-02-21 NOTE — Unmapped (Signed)
[facility] Pharmacy Refill Coordination Note    Specialty Medication(s) to be Shipped:   Transplant: mycophenolate mofetil 180mg  and tacrolimus 1mg     Other medication(s) to be shipped: Evelyn Rollins, DOB: 1966/09/13  Phone: [phone] (home)       All above HIPAA information was verified with patient.     Was a Nurse, learning disability used for this call? No    Completed refill call assessment today to schedule patient's medication shipment from the [facility] [phone]).       Specialty medication(s) and dose(s) confirmed: Regimen is correct and unchanged.   Changes to medications: Axie reports no changes at this time.  Changes to insurance: No  Questions for the pharmacist: No    Confirmed patient received Welcome Packet with first shipment. The patient will receive a drug information handout for each medication shipped and additional FDA Medication Guides as required.       DISEASE/MEDICATION-SPECIFIC INFORMATION        N/A    SPECIALTY MEDICATION ADHERENCE     Medication Adherence    Patient reported X missed doses in the last month: 0  Specialty Medication: Mycophenolate 180mg   Patient is on additional specialty medications: Yes  Additional Specialty Medications: Tacrolimus 1mg   Patient Reported Additional Medication X Missed Doses in the Last Month: 0  Patient is on more than two specialty medications: No  Adherence tools used: patient uses a pill box to manage medications          Mycophenolate 180 mg: 4 days of medicine on hand   Tacrolimus 1 mg: 4 days of medicine on hand     SHIPPING     Shipping address confirmed in Epic.     Delivery Scheduled: Yes, Expected medication delivery date: 02/25/2020.     Medication will be delivered via UPS to the prescription address in Epic WAM.    Lorelei Pont [facility] Specialty Technician

## 2020-02-24 MED FILL — MYCOPHENOLATE SODIUM 180 MG TABLET,DELAYED RELEASE: 30 days supply | Qty: 120 | Fill #9

## 2020-02-24 MED FILL — MYCOPHENOLATE SODIUM 180 MG TABLET,DELAYED RELEASE: 30 days supply | Qty: 120 | Fill #9 | Status: AC

## 2020-02-24 MED FILL — TACROLIMUS 1 MG CAPSULE, IMMEDIATE-RELEASE: 30 days supply | Qty: 90 | Fill #7

## 2020-02-24 MED FILL — TACROLIMUS 1 MG CAPSULE, IMMEDIATE-RELEASE: 30 days supply | Qty: 90 | Fill #7 | Status: AC

## 2020-02-25 LAB — COMPREHENSIVE METABOLIC PANEL
A/G RATIO: 1.7 (ref 1.2–2.2)
ALKALINE PHOSPHATASE: 94 IU/L (ref 39–117)
ALT (SGPT): 82 IU/L — ABNORMAL HIGH (ref 0–32)
AST (SGOT): 47 IU/L — ABNORMAL HIGH (ref 0–40)
BILIRUBIN TOTAL: 0.4 mg/dL (ref 0.0–1.2)
BLOOD UREA NITROGEN: 12 mg/dL (ref 6–24)
BUN / CREAT RATIO: 13 (ref 9–23)
CALCIUM: 9.2 mg/dL (ref 8.7–10.2)
CHLORIDE: 107 mmol/L — ABNORMAL HIGH (ref 96–106)
CO2: 21 mmol/L (ref 20–29)
CREATININE: 0.94 mg/dL (ref 0.57–1.00)
GFR MDRD AF AMER: 80 mL/min/{1.73_m2}
GFR MDRD NON AF AMER: 69 mL/min/{1.73_m2}
GLOBULIN, TOTAL: 2.4 g/dL (ref 1.5–4.5)
GLUCOSE: 120 mg/dL — ABNORMAL HIGH (ref 65–99)
POTASSIUM: 4.5 mmol/L (ref 3.5–5.2)
SODIUM: 141 mmol/L (ref 134–144)
TOTAL PROTEIN: 6.5 g/dL (ref 6.0–8.5)

## 2020-02-25 LAB — BILIRUBIN DIRECT: Bilirubin.glucuronidated+Bilirubin.albumin bound:MCnc:Pt:Ser/Plas:Qn:: 0.11

## 2020-02-25 LAB — CBC W/ DIFFERENTIAL
BASOPHILS ABSOLUTE COUNT: 0.1 10*3/uL (ref 0.0–0.2)
BASOPHILS RELATIVE PERCENT: 1 %
EOSINOPHILS ABSOLUTE COUNT: 0.3 10*3/uL (ref 0.0–0.4)
EOSINOPHILS RELATIVE PERCENT: 4 %
HEMATOCRIT: 39.4 % (ref 34.0–46.6)
IMMATURE GRANULOCYTES: 0 %
LYMPHOCYTES ABSOLUTE COUNT: 2.6 10*3/uL (ref 0.7–3.1)
LYMPHOCYTES RELATIVE PERCENT: 33 %
MEAN CORPUSCULAR HEMOGLOBIN CONC: 34.3 g/dL (ref 31.5–35.7)
MEAN CORPUSCULAR HEMOGLOBIN: 29.9 pg (ref 26.6–33.0)
MEAN CORPUSCULAR VOLUME: 87 fL (ref 79–97)
MONOCYTES ABSOLUTE COUNT: 0.6 10*3/uL (ref 0.1–0.9)
MONOCYTES RELATIVE PERCENT: 7 %
NEUTROPHILS ABSOLUTE COUNT: 4.4 10*3/uL (ref 1.4–7.0)
NEUTROPHILS RELATIVE PERCENT: 55 %
RED BLOOD CELL COUNT: 4.52 x10E6/uL (ref 3.77–5.28)
RED CELL DISTRIBUTION WIDTH: 13.5 % (ref 11.7–15.4)
WHITE BLOOD CELL COUNT: 8 10*3/uL (ref 3.4–10.8)

## 2020-02-25 LAB — MEAN CORPUSCULAR HEMOGLOBIN: Erythrocyte mean corpuscular hemoglobin:EntMass:Pt:RBC:Qn:Automated count: 29.9

## 2020-02-25 LAB — BUN / CREAT RATIO: Urea nitrogen/Creatinine:MRto:Pt:Ser/Plas:Qn:: 13

## 2020-02-25 LAB — GAMMA GLUTAMYL TRANSFERASE: Gamma glutamyl transferase:CCnc:Pt:Ser/Plas:Qn:: 23

## 2020-02-25 LAB — PHOSPHORUS, SERUM: Phosphate:MCnc:Pt:Ser/Plas:Qn:: 2.7 — ABNORMAL LOW

## 2020-02-25 LAB — MAGNESIUM: Magnesium:MCnc:Pt:Ser/Plas:Qn:: 1.8

## 2020-02-25 NOTE — Unmapped (Signed)
Sent message to Gertie Fey, NP about pt's 02/24/2020 labs:   From: Nigel Bridgeman   Sent: 02/25/2020 ?? 2:21 PM EDT   To: Bertram Denver, FNP   Subject: LFTs ?? ?? ?? ?? ?? ?? ?? ?? ?? ?? ?? ?? ?? ?? ?? ?? ?? ?? ?? ??     Hi.     Her AST and ALT are on a slow rise. I have talked with her in the past and she has not been exercising.   With her February labs, she mentioned that she has had a lot going on in the past few months with her mom living with her until her mom's house sold; also she had to go through all her dad's stuff which was hard since he passed away last year.     I told her to get labs in March b/c I did not want it to be months before seeing her labs again.     The last time, her labs did this in 2018, we discussed fatty liver and she started exercising and her labs normalized.     Her liver US last year showed fatty liver.     (In this message, asked Amil Amen if there was anything she wanted to add besides patient eating healthier, exercise and labs in April before I called).       Amil Amen messaged back saying it was probably related to the fatty liver and to ensure she was not missing medication or drinking alcohol; also to follow up with PCP for diabetes control.     Talked with patient who confirmed she has missed doses in the past but not just prior to these labs; denies alcohol. She confirmed she is not eating healthy and not exercising. We discussed in few years ago when her labs were going up that it was attributed to inflammation from the fat in her liver and her [place] from last year shows this. Mentioned the good thing about this is that it can be reversible if she works on eating healthy and exercising now before the inflammation begins to scar her liver.   Verbalized understanding to:  1) Follow up with PCP in regards to glucose and ensuring BG are stable  2) She has already met with dietitians in the past; to incorporate healthier food choices and decreasing her soft drink intake. She was drinking diet drinks and she started drinking normal soft drinks.   3) Going on walks and seeing if her insurance will cover a gym membership; if covers gym membership for her to go in low traffic times using COVID precautions. She mentioned in the past she walked. Discussed parking further away from the store and not parking close to incorporate more exercise.   4) Calling her counselor she was seeing locally before. Mentioned that if she is stress eating or eating as a coping mechanism that she needs to talk to counselor to get to the root issue so she is healthy mentally and physically and not repeating the cycle if not addressing the root issue.   5) Have an accountability partner/friend to help motivate her.     Discussed taking this one day at a time and tomorrow to start with calling counselor and going on a walk.     Patient recognized the need to make healthier options. Discussed also by doing these things that she will probably lose weight and then she can follow up with her hernia repair since she mentioned in the  past surgeon wanted her to lose weight before having the surgery for better response/healing.

## 2020-02-26 LAB — TACROLIMUS BLOOD: Tacrolimus:MCnc:Pt:Bld:Qn:LC/MS/MS: 4.8

## 2020-03-02 DIAGNOSIS — Z944 Liver transplant status: Principal | ICD-10-CM

## 2020-03-16 DIAGNOSIS — Z944 Liver transplant status: Principal | ICD-10-CM

## 2020-03-18 DIAGNOSIS — Z944 Liver transplant status: Principal | ICD-10-CM

## 2020-03-18 MED ORDER — MYCOPHENOLATE SODIUM 180 MG TABLET,DELAYED RELEASE
ORAL_TABLET | 9 refills | 0 days | Status: CP
Start: 2020-03-18 — End: 2021-03-30
  Filled 2020-03-30: qty 120, 30d supply, fill #0

## 2020-03-27 NOTE — Unmapped (Signed)
[facility] Refill Coordination Note    Specialty Medication(s) to be Shipped:   Transplant:  mycophenolic acid 180mg  and tacrolimus 1mg     Other medication(s) to be shipped: none     Evelyn Rollins, DOB: 03-15-1966  Phone: [phone] (home)       All above HIPAA information was verified with patient.     Was a Nurse, learning disability used for this call? No    Completed refill call assessment today to schedule patient's medication shipment from the [facility] [phone]).       Specialty medication(s) and dose(s) confirmed: Regimen is correct and unchanged.   Changes to medications: Evelyn Rollins reports no changes at this time.  Changes to insurance: No  Questions for the pharmacist: No    Confirmed patient received Welcome Packet with first shipment. The patient will receive a drug information handout for each medication shipped and additional FDA Medication Guides as required.       DISEASE/MEDICATION-SPECIFIC INFORMATION        N/A    SPECIALTY MEDICATION ADHERENCE     Medication Adherence    Patient reported X missed doses in the last month: 0  Adherence tools used: patient uses a pill box to manage medications          Mycophenolate 180 mg: 10 days of medicine on hand   Tacrolimus 1 mg: 10 days of medicine on hand         SHIPPING     Shipping address confirmed in Epic.     Delivery Scheduled: Yes, Expected medication delivery date: 03/31/20.     Medication will be delivered via UPS to the prescription address in Epic WAM.    Evelyn Rollins   [facility] Specialty Technician

## 2020-03-30 DIAGNOSIS — Z944 Liver transplant status: Principal | ICD-10-CM

## 2020-03-30 MED FILL — TACROLIMUS 1 MG CAPSULE, IMMEDIATE-RELEASE: 30 days supply | Qty: 90 | Fill #8

## 2020-03-30 MED FILL — TACROLIMUS 1 MG CAPSULE, IMMEDIATE-RELEASE: 30 days supply | Qty: 90 | Fill #8 | Status: AC

## 2020-03-30 MED FILL — MYCOPHENOLATE SODIUM 180 MG TABLET,DELAYED RELEASE: 30 days supply | Qty: 120 | Fill #0 | Status: AC

## 2020-04-03 NOTE — Unmapped (Signed)
Received notification from [facility] that pt was requesting letter to state her LFTs are abnormal in support of her disability claim. Reviewed pt with Gertie Fey FNP who is managing pt care.Per NP, even though LFTs are slightly elevated, there is no indication for disability from a liver transplant standpoint at this time. Called pt to discuss request. Got vm and left vm requesting return call.

## 2020-04-13 DIAGNOSIS — Z944 Liver transplant status: Principal | ICD-10-CM

## 2020-04-15 LAB — CBC W/ DIFFERENTIAL
BANDED NEUTROPHILS ABSOLUTE COUNT: 0.1 10*3/uL (ref 0.0–0.1)
BASOPHILS ABSOLUTE COUNT: 0 10*3/uL (ref 0.0–0.2)
EOSINOPHILS ABSOLUTE COUNT: 0.4 10*3/uL (ref 0.0–0.4)
HEMATOCRIT: 43.1 % (ref 34.0–46.6)
IMMATURE GRANULOCYTES: 1 %
LYMPHOCYTES ABSOLUTE COUNT: 2.3 10*3/uL (ref 0.7–3.1)
LYMPHOCYTES RELATIVE PERCENT: 34 %
MEAN CORPUSCULAR HEMOGLOBIN CONC: 32.3 g/dL (ref 31.5–35.7)
MEAN CORPUSCULAR HEMOGLOBIN: 28.6 pg (ref 26.6–33.0)
MEAN CORPUSCULAR VOLUME: 89 fL (ref 79–97)
MONOCYTES ABSOLUTE COUNT: 0.5 10*3/uL (ref 0.1–0.9)
MONOCYTES RELATIVE PERCENT: 7 %
NEUTROPHILS RELATIVE PERCENT: 52 %
PLATELET COUNT: 228 10*3/uL (ref 150–450)
RED BLOOD CELL COUNT: 4.86 x10E6/uL (ref 3.77–5.28)
RED CELL DISTRIBUTION WIDTH: 12.9 % (ref 11.7–15.4)
WHITE BLOOD CELL COUNT: 6.9 10*3/uL (ref 3.4–10.8)

## 2020-04-15 LAB — COMPREHENSIVE METABOLIC PANEL
A/G RATIO: 2.3 — ABNORMAL HIGH (ref 1.2–2.2)
ALBUMIN: 4.5 g/dL (ref 3.8–4.9)
ALT (SGPT): 72 IU/L — ABNORMAL HIGH (ref 0–32)
AST (SGOT): 39 IU/L (ref 0–40)
BILIRUBIN TOTAL: 0.4 mg/dL (ref 0.0–1.2)
BLOOD UREA NITROGEN: 18 mg/dL (ref 6–24)
BUN / CREAT RATIO: 17 (ref 9–23)
CALCIUM: 9.6 mg/dL (ref 8.7–10.2)
CO2: 23 mmol/L (ref 20–29)
CREATININE: 1.03 mg/dL — ABNORMAL HIGH (ref 0.57–1.00)
GFR MDRD AF AMER: 71 mL/min/{1.73_m2}
GFR MDRD NON AF AMER: 62 mL/min/{1.73_m2}
GLOBULIN, TOTAL: 2 g/dL (ref 1.5–4.5)
GLUCOSE: 96 mg/dL (ref 65–99)
POTASSIUM: 4.7 mmol/L (ref 3.5–5.2)
SODIUM: 141 mmol/L (ref 134–144)
TOTAL PROTEIN: 6.5 g/dL (ref 6.0–8.5)

## 2020-04-15 LAB — GAMMA GLUTAMYL TRANSFERASE: Gamma glutamyl transferase:CCnc:Pt:Ser/Plas:Qn:: 18

## 2020-04-15 LAB — TACROLIMUS BLOOD: Tacrolimus:MCnc:Pt:Bld:Qn:LC/MS/MS: 2.6

## 2020-04-15 LAB — BILIRUBIN DIRECT: Bilirubin.glucuronidated+Bilirubin.albumin bound:MCnc:Pt:Ser/Plas:Qn:: 0.13

## 2020-04-15 LAB — EOSINOPHILS ABSOLUTE COUNT: Eosinophils:NCnc:Pt:Bld:Qn:Automated count: 0.4

## 2020-04-15 LAB — PHOSPHORUS: PHOSPHORUS, SERUM: 3.4 mg/dL (ref 3.0–4.3)

## 2020-04-15 LAB — PHOSPHORUS, SERUM: Phosphate:MCnc:Pt:Ser/Plas:Qn:: 3.4

## 2020-04-15 LAB — GFR MDRD NON AF AMER
Glomerular filtration rate/1.73 sq M.predicted.non black:ArVRat:Pt:Ser/Plas/Bld:Qn:Creatinine-based formula (CKD-EPI): 62

## 2020-04-15 LAB — MAGNESIUM: Magnesium:MCnc:Pt:Ser/Plas:Qn:: 1.9

## 2020-04-21 NOTE — Unmapped (Signed)
[facility] Clinical Assessment & Refill Coordination Note    Evelyn Rollins, DOB: [birth year]  Phone: [phone] (home)     All above HIPAA information was verified with patient.     Was a Nurse, learning disability used for this call? No    Specialty Medication(s):   Transplant:  mycophenolic acid 180mg  and tacrolimus 1mg      Current Outpatient Medications   Medication Sig Dispense Refill   ??? levothyroxine (SYNTHROID) 100 MCG tablet 100 mcg.      ??? acetaminophen (TYLENOL) 325 MG tablet Take 650 mg by mouth daily as needed.     ??? amLODIPine (NORVASC) 5 MG tablet Take 5 mg by mouth daily.     ??? ARIPiprazole (ABILIFY) 5 MG tablet Take 5 mg by mouth nightly.      ??? aspirin (ECOTRIN) 81 MG tablet Take 81 mg by mouth daily.     ??? buPROPion (WELLBUTRIN XL) 300 MG 24 hr tablet Take 300 mg by mouth daily. PCP managing     ??? calcium carbonate/vitamin D3 (CALCIUM WITH VITAMIN D ORAL) Take 600 mg by mouth every other day. Calcium 600mg  and vitamin D 200units     ??? escitalopram oxalate (LEXAPRO) 20 MG tablet Take 20 mg by mouth daily. PCP managing this     ??? fluticasone propionate (FLONASE) 50 mcg/actuation nasal spray 2 sprays into each nostril.     ??? liothyronine (CYTOMEL) 5 MCG tablet TAKE 1 TABLET ON AN EMPTY STOMACH X 7 DAYS, THEN INCREASE TO 2 TABS IF 1 TAB TOLERATED     ??? liraglutide (VICTOZA) injection pen Inject 0.1 mL (0.6 mg total) under the skin daily. 9 mL 3   ??? LORazepam (ATIVAN) 0.5 MG tablet Take 1 mg by mouth two (2) times a day as needed for anxiety.      ??? magnesium oxide (MAG-OX) 400 mg tablet Take 400 mg by mouth.     ??? magnesium oxide 150 mg magnesium Tab tablet Take by mouth. (Patient not taking: Reported on 04/21/2020)     ??? mycophenolate (MYFORTIC) 180 MG EC tablet Take 2 tablets (360mg ) by mouth twice daily. 120 tablet 9   ??? nystatin, bulk, 15 billion unit Powd 1 application by Miscellaneous route once as needed.     ??? pen needle, diabetic 29 gauge x 1/2 (12 mm) Ndle Use as directed once daily 100 each 11   ??? pravastatin (PRAVACHOL) 40 MG tablet Take 40 mg by mouth daily. PCP started and managing     ??? progesterone (PROMETRIUM) 100 MG capsule Take 100 mg by mouth.     ??? tacrolimus (PROGRAF) 1 MG capsule Take 2 capsules (2mg ) by mouth in the morning and 1 capsule (1mg ) in the evening. 120 capsule 9   ??? zolpidem (AMBIEN) 5 MG tablet Take 1 tab by mouth at bedtime       No current facility-administered medications for this visit.        Changes to medications: levo is now daily    No Known Allergies    Changes to allergies: No    SPECIALTY MEDICATION ADHERENCE     Tacrolimus 1mg   : 8 days of medicine on hand   Mycophenolate 180mg   : 8 days of medicine on hand     Medication Adherence    Patient reported X missed doses in the last month: 0  Specialty Medication: tacrolimus 1mg   Patient is on additional specialty medications: Yes  Additional Specialty Medications: Mycophenolate 180mg   Patient Reported Additional Medication X Missed Doses in the Last Month: 0  Adherence tools used: patient uses a pill box to manage medications          Specialty medication(s) dose(s) confirmed: Regimen is correct and unchanged.     Are there any concerns with adherence? No    Adherence counseling provided? Not needed    CLINICAL MANAGEMENT AND INTERVENTION      Clinical Benefit Assessment:    Do you feel the medicine is effective or helping your condition? Yes    Clinical Benefit counseling provided? Not needed    Adverse Effects Assessment:    Are you experiencing any side effects? No    Are you experiencing difficulty administering your medicine? No    Quality of Life Assessment:    How many days over the past month did your transplant  keep you from your normal activities? For example, brushing your teeth or getting up in the morning. 0    Have you discussed this with your provider? Not needed    Therapy Appropriateness:    Is therapy appropriate? Yes, therapy is appropriate and should be continued DISEASE/MEDICATION-SPECIFIC INFORMATION      N/A    PATIENT SPECIFIC NEEDS     - Does the patient have any physical, cognitive, or cultural barriers? No    - Is the patient high risk? Yes, patient is taking a REMS drug. Medication is dispensed in compliance with REMS program.     - Does the patient require a Care Management Plan? No     - Does the patient require physician intervention or other additional services (i.e. nutrition, smoking cessation, social work)? No      SHIPPING     Specialty Medication(s) to be Shipped:   Transplant:  mycophenolic acid 180mg  and tacrolimus 1mg     Other medication(s) to be shipped: na     Changes to insurance: No    Delivery Scheduled: Yes, Expected medication delivery date: 04/27/2020.     Medication will be delivered via UPS to the confirmed prescription address in [facility].    The patient will receive a drug information handout for each medication shipped and additional FDA Medication Guides as required.  Verified that patient has previously received a Conservation officer, historic buildings.    All of the patient's questions and concerns have been addressed.    Thad Ranger   [facility] Specialty Pharmacist

## 2020-04-24 MED FILL — MYCOPHENOLATE SODIUM 180 MG TABLET,DELAYED RELEASE: 30 days supply | Qty: 120 | Fill #1 | Status: AC

## 2020-04-24 MED FILL — TACROLIMUS 1 MG CAPSULE, IMMEDIATE-RELEASE: 30 days supply | Qty: 90 | Fill #9 | Status: AC

## 2020-04-24 MED FILL — TACROLIMUS 1 MG CAPSULE, IMMEDIATE-RELEASE: 30 days supply | Qty: 90 | Fill #9

## 2020-04-24 MED FILL — MYCOPHENOLATE SODIUM 180 MG TABLET,DELAYED RELEASE: 30 days supply | Qty: 120 | Fill #1

## 2020-04-25 NOTE — Unmapped (Signed)
Late entry: Sent message to Gertie Fey, NP about pt's 5/4 labs specifically her tac level:  ----- Message -----   From: Nigel Bridgeman   Sent: 04/16/2020 ?? 2:42 PM EDT   To: Bertram Denver, FNP   Subject: Tac 2.6? Wait for next? ?? ?? ?? ?? ?? ?? ?? ?? ?? ?? ??     Hi. 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     Myfortic 360mg  bid   Tac 2/1 since August 2020     Okay to wait for next routine set of labs in a month?          Amil Amen responded that same day on May 6 confirming it was okay to wait for labs in a month.

## 2020-04-27 DIAGNOSIS — Z944 Liver transplant status: Principal | ICD-10-CM

## 2020-05-08 NOTE — Unmapped (Signed)
Called patient and mentioned that Evelyn Fey, NP and this coordinator had discussed her May 4 labs a few weeks ago and her LFTs were better with slight improvement in setting of lower than usual tac level; mentioned it was discussed that patient to get labs in a month to ensure her LFTs/labs were still stable and that her tac level was in goal.   Verbalized understanding to get labs next week or the next.   Patient doing well.    When asked if she had COVID vaccine, she denied having it and did not want to get it right now. She felt it was too soon/fast.   Discussed the COVID vaccine and benefits of having it and that other transplant patients have done well from the vaccine.

## 2020-05-11 DIAGNOSIS — Z944 Liver transplant status: Principal | ICD-10-CM

## 2020-05-19 NOTE — Unmapped (Signed)
[facility] Refill Coordination Note    Specialty Medication(s) to be Shipped:   Transplant: mycophenolate mofetil 180mg  and tacrolimus 1mg     Other medication(s) to be shipped: N/A     [name], DOB: 07-12-1966  Phone: [phone] (home)       All above HIPAA information was verified with patient.     Was a Nurse, learning disability used for this call? No    Completed refill call assessment today to schedule patient's medication shipment from the [facility] [phone]).       Specialty medication(s) and dose(s) confirmed: Regimen is correct and unchanged.   Changes to medications: Shakiara reports no changes at this time.  Changes to insurance: No  Questions for the pharmacist: No    Confirmed patient received Welcome Packet with first shipment. The patient will receive a drug information handout for each medication shipped and additional FDA Medication Guides as required.       DISEASE/MEDICATION-SPECIFIC INFORMATION        N/A    SPECIALTY MEDICATION ADHERENCE     Medication Adherence    Patient reported X missed doses in the last month: 0  Specialty Medication: Mycophenolate 180mg   Patient is on additional specialty medications: Yes  Additional Specialty Medications: Tacrolimus 1mg   Patient Reported Additional Medication X Missed Doses in the Last Month: 0  Patient is on more than two specialty medications: No  Adherence tools used: patient uses a pill box to manage medications        Mycophenolate 180 mg: 7 days of medicine on hand   Tacrolimus 1 mg: 7 days of medicine on hand     SHIPPING     Shipping address confirmed in Epic.     Delivery Scheduled: Yes, Expected medication delivery date: 05/22/2020.     Medication will be delivered via UPS to the prescription address in Epic [facility].    Lorelei Pont [facility] Specialty Technician

## 2020-05-21 MED FILL — MYCOPHENOLATE SODIUM 180 MG TABLET,DELAYED RELEASE: 30 days supply | Qty: 120 | Fill #2 | Status: AC

## 2020-05-21 MED FILL — TACROLIMUS 1 MG CAPSULE, IMMEDIATE-RELEASE: 30 days supply | Qty: 90 | Fill #10

## 2020-05-21 MED FILL — TACROLIMUS 1 MG CAPSULE, IMMEDIATE-RELEASE: 30 days supply | Qty: 90 | Fill #10 | Status: AC

## 2020-05-21 MED FILL — MYCOPHENOLATE SODIUM 180 MG TABLET,DELAYED RELEASE: 30 days supply | Qty: 120 | Fill #2

## 2020-05-25 DIAGNOSIS — Z944 Liver transplant status: Principal | ICD-10-CM

## 2020-05-28 DIAGNOSIS — Z944 Liver transplant status: Principal | ICD-10-CM

## 2020-05-29 LAB — CBC W/ DIFFERENTIAL
BANDED NEUTROPHILS ABSOLUTE COUNT: 0.1 10*3/uL (ref 0.0–0.1)
BASOPHILS ABSOLUTE COUNT: 0.1 10*3/uL (ref 0.0–0.2)
BASOPHILS RELATIVE PERCENT: 1 %
EOSINOPHILS ABSOLUTE COUNT: 0.2 10*3/uL (ref 0.0–0.4)
EOSINOPHILS RELATIVE PERCENT: 3 %
HEMATOCRIT: 40.1 % (ref 34.0–46.6)
HEMOGLOBIN: 13.5 g/dL (ref 11.1–15.9)
IMMATURE GRANULOCYTES: 1 %
LYMPHOCYTES ABSOLUTE COUNT: 2.4 10*3/uL (ref 0.7–3.1)
LYMPHOCYTES RELATIVE PERCENT: 35 %
MEAN CORPUSCULAR HEMOGLOBIN CONC: 33.7 g/dL (ref 31.5–35.7)
MEAN CORPUSCULAR HEMOGLOBIN: 29.2 pg (ref 26.6–33.0)
MEAN CORPUSCULAR VOLUME: 87 fL (ref 79–97)
MONOCYTES ABSOLUTE COUNT: 0.5 10*3/uL (ref 0.1–0.9)
NEUTROPHILS ABSOLUTE COUNT: 3.7 10*3/uL (ref 1.4–7.0)
NEUTROPHILS RELATIVE PERCENT: 53 %
PLATELET COUNT: 232 10*3/uL (ref 150–450)
RED BLOOD CELL COUNT: 4.62 x10E6/uL (ref 3.77–5.28)
RED CELL DISTRIBUTION WIDTH: 12.8 % (ref 11.7–15.4)
WHITE BLOOD CELL COUNT: 6.9 10*3/uL (ref 3.4–10.8)

## 2020-05-29 LAB — COMPREHENSIVE METABOLIC PANEL
A/G RATIO: 1.8 (ref 1.2–2.2)
ALKALINE PHOSPHATASE: 98 IU/L (ref 48–121)
ALT (SGPT): 58 IU/L — ABNORMAL HIGH (ref 0–32)
AST (SGOT): 26 IU/L (ref 0–40)
BILIRUBIN TOTAL: 0.3 mg/dL (ref 0.0–1.2)
BLOOD UREA NITROGEN: 18 mg/dL (ref 6–24)
CALCIUM: 9.4 mg/dL (ref 8.7–10.2)
CHLORIDE: 106 mmol/L (ref 96–106)
GFR MDRD AF AMER: 76 mL/min/{1.73_m2}
GFR MDRD NON AF AMER: 66 mL/min/{1.73_m2}
GLOBULIN, TOTAL: 2.3 g/dL (ref 1.5–4.5)
GLUCOSE: 107 mg/dL — ABNORMAL HIGH (ref 65–99)
POTASSIUM: 4.6 mmol/L (ref 3.5–5.2)
SODIUM: 141 mmol/L (ref 134–144)
TOTAL PROTEIN: 6.5 g/dL (ref 6.0–8.5)

## 2020-05-29 LAB — GAMMA GLUTAMYL TRANSFERASE: Gamma glutamyl transferase:CCnc:Pt:Ser/Plas:Qn:: 20

## 2020-05-29 LAB — MEAN CORPUSCULAR VOLUME: Erythrocyte mean corpuscular volume:EntVol:Pt:RBC:Qn:Automated count: 87

## 2020-05-29 LAB — BLOOD UREA NITROGEN: Urea nitrogen:MCnc:Pt:Ser/Plas:Qn:: 18

## 2020-05-29 LAB — PHOSPHORUS, SERUM: Phosphate:MCnc:Pt:Ser/Plas:Qn:: 3.3

## 2020-05-29 LAB — BILIRUBIN DIRECT: Bilirubin.glucuronidated+Bilirubin.albumin bound:MCnc:Pt:Ser/Plas:Qn:: 0.14

## 2020-05-29 LAB — BILIRUBIN, DIRECT: BILIRUBIN DIRECT: 0.14 mg/dL (ref 0.00–0.40)

## 2020-05-29 LAB — MAGNESIUM: Magnesium:MCnc:Pt:Ser/Plas:Qn:: 1.8

## 2020-05-30 LAB — TACROLIMUS BLOOD: Tacrolimus:MCnc:Pt:Bld:Qn:LC/MS/MS: 3.6

## 2020-06-08 DIAGNOSIS — Z944 Liver transplant status: Principal | ICD-10-CM

## 2020-06-08 NOTE — Unmapped (Signed)
Called patient to discuss scheduling liver transplant annual appointments. Appointments scheduled as per patients preferred date and time. Patient stated verbal understanding of all 07/06/20 appointments. Appt letter mailed to patient via Epic letter.

## 2020-06-22 DIAGNOSIS — Z944 Liver transplant status: Principal | ICD-10-CM

## 2020-06-24 NOTE — Unmapped (Signed)
[facility] Refill Coordination Note    Specialty Medication(s) to be Shipped:   Transplant:  mycophenolic acid 180mg  and tacrolimus 1mg     Other medication(s) to be shipped: none     Evelyn Rollins, DOB: [birth year]  Phone: [phone] (home)       All above HIPAA information was verified with patient.     Was a Nurse, learning disability used for this call? No    Completed refill call assessment today to schedule patient's medication shipment from the [facility] [phone]).       Specialty medication(s) and dose(s) confirmed: Regimen is correct and unchanged.   Changes to medications: Evelyn Rollins reports no changes at this time.  Changes to insurance: No  Questions for the pharmacist: No    Confirmed patient received Welcome Packet with first shipment. The patient will receive a drug information handout for each medication shipped and additional FDA Medication Guides as required.       DISEASE/MEDICATION-SPECIFIC INFORMATION        N/A    SPECIALTY MEDICATION ADHERENCE     Medication Adherence    Patient reported X missed doses in the last month: 0  Adherence tools used: patient uses a pill box to manage medications        Mycophenolate 180 mg: 10 days of medicine on hand   Tacrolimus 1 mg: 10 days of medicine on hand             SHIPPING     Shipping address confirmed in Epic.     Delivery Scheduled: Yes, Expected medication delivery date: 06/29/20.     Medication will be delivered via UPS to the prescription address in Epic WAM.    Evelyn Rollins   [facility] Specialty Technician

## 2020-06-26 MED FILL — TACROLIMUS 1 MG CAPSULE, IMMEDIATE-RELEASE: 30 days supply | Qty: 90 | Fill #11 | Status: AC

## 2020-06-26 MED FILL — MYCOPHENOLATE SODIUM 180 MG TABLET,DELAYED RELEASE: 30 days supply | Qty: 120 | Fill #3

## 2020-06-26 MED FILL — TACROLIMUS 1 MG CAPSULE, IMMEDIATE-RELEASE: 30 days supply | Qty: 90 | Fill #11

## 2020-06-26 MED FILL — MYCOPHENOLATE SODIUM 180 MG TABLET,DELAYED RELEASE: 30 days supply | Qty: 120 | Fill #3 | Status: AC

## 2020-06-30 NOTE — Unmapped (Signed)
Last Saturday, on July 10, patient went out to eat with daughter and son-in-law and both her daughter and son-in-law and tested positive the next day.   Starting to have symptoms last Wednesday, July 14. Covid + test on Saturday, July 17 when symptoms worsened with fever around 100, cough, headache, no taste and no smell. Last night the fever was 102 and today has been 99.      Verbalized understanding that if her fever goes above 101.5 again, develops SOB, or symptoms worsent that she needs to go to ED/urgent care to be assessed; encouraged to hydrate well.   Mentioned to call PCP if she needs symptoms relief beyond OTC medication.     Patient needs her annual appointments rescheduled. Will route to provider and [facility]PA to be aware and will message TPA in checklist to reschedule the July 26 appointments until mid-August.

## 2020-07-01 NOTE — Unmapped (Signed)
Called patient to discuss rescheduling liver transplant annual appointments, unable to reach patient left VM requesting a call back at [phone].

## 2020-07-02 ENCOUNTER — Ambulatory Visit: Admit: 2020-07-02 | Discharge: 2020-08-12 | Disposition: E | Payer: MEDICARE | Source: Other Acute Inpatient Hospital

## 2020-07-02 ENCOUNTER — Emergency Department (HOSPITAL_COMMUNITY): Payer: Medicare Other

## 2020-07-02 ENCOUNTER — Other Ambulatory Visit: Payer: Self-pay

## 2020-07-02 ENCOUNTER — Emergency Department (HOSPITAL_COMMUNITY)
Admission: EM | Admit: 2020-07-02 | Discharge: 2020-07-02 | Disposition: A | Discharge status: Other Acute Inpatient Hospital | Payer: Medicare Other | Attending: Emergency Medicine | Admitting: Emergency Medicine

## 2020-07-02 ENCOUNTER — Encounter (HOSPITAL_COMMUNITY): Payer: Self-pay

## 2020-07-02 DIAGNOSIS — Z944 Liver transplant status: Secondary | ICD-10-CM | POA: Diagnosis not present

## 2020-07-02 DIAGNOSIS — E039 Hypothyroidism, unspecified: Secondary | ICD-10-CM | POA: Diagnosis not present

## 2020-07-02 DIAGNOSIS — D84821 Immunodeficiency due to drugs: Secondary | ICD-10-CM

## 2020-07-02 DIAGNOSIS — Z6841 Body Mass Index (BMI) 40.0 and over, adult: Secondary | ICD-10-CM | POA: Diagnosis not present

## 2020-07-02 DIAGNOSIS — R0602 Shortness of breath: Secondary | ICD-10-CM | POA: Diagnosis present

## 2020-07-02 DIAGNOSIS — G40909 Epilepsy, unspecified, not intractable, without status epilepticus: Secondary | ICD-10-CM | POA: Diagnosis not present

## 2020-07-02 DIAGNOSIS — E785 Hyperlipidemia, unspecified: Secondary | ICD-10-CM | POA: Diagnosis present

## 2020-07-02 DIAGNOSIS — G4733 Obstructive sleep apnea (adult) (pediatric): Secondary | ICD-10-CM | POA: Diagnosis not present

## 2020-07-02 DIAGNOSIS — R638 Other symptoms and signs concerning food and fluid intake: Secondary | ICD-10-CM | POA: Diagnosis not present

## 2020-07-02 DIAGNOSIS — E119 Type 2 diabetes mellitus without complications: Secondary | ICD-10-CM | POA: Insufficient documentation

## 2020-07-02 DIAGNOSIS — Z9989 Dependence on other enabling machines and devices: Secondary | ICD-10-CM

## 2020-07-02 DIAGNOSIS — E1169 Type 2 diabetes mellitus with other specified complication: Secondary | ICD-10-CM | POA: Diagnosis present

## 2020-07-02 DIAGNOSIS — I10 Essential (primary) hypertension: Secondary | ICD-10-CM | POA: Diagnosis present

## 2020-07-02 DIAGNOSIS — J1282 Pneumonia due to coronavirus disease 2019: Secondary | ICD-10-CM | POA: Diagnosis present

## 2020-07-02 DIAGNOSIS — E66813 Obesity, class 3: Secondary | ICD-10-CM | POA: Diagnosis present

## 2020-07-02 DIAGNOSIS — Z7984 Long term (current) use of oral hypoglycemic drugs: Secondary | ICD-10-CM | POA: Insufficient documentation

## 2020-07-02 DIAGNOSIS — J9601 Acute respiratory failure with hypoxia: Secondary | ICD-10-CM | POA: Diagnosis not present

## 2020-07-02 DIAGNOSIS — Z79899 Other long term (current) drug therapy: Secondary | ICD-10-CM

## 2020-07-02 DIAGNOSIS — U071 COVID-19: Secondary | ICD-10-CM | POA: Diagnosis not present

## 2020-07-02 DIAGNOSIS — Z7982 Long term (current) use of aspirin: Secondary | ICD-10-CM | POA: Diagnosis not present

## 2020-07-02 DIAGNOSIS — Z79890 Hormone replacement therapy (postmenopausal): Secondary | ICD-10-CM | POA: Diagnosis not present

## 2020-07-02 DIAGNOSIS — R0902 Hypoxemia: Secondary | ICD-10-CM

## 2020-07-02 DIAGNOSIS — Z01818 Encounter for other preprocedural examination: Secondary | ICD-10-CM

## 2020-07-02 HISTORY — DX: Essential (primary) hypertension: I10

## 2020-07-02 LAB — BLOOD GAS CRITICAL CARE PANEL, VENOUS
CALCIUM IONIZED VENOUS (MG/DL): 4.62 mg/dL (ref 4.40–5.40)
GLUCOSE WHOLE BLOOD: 208 mg/dL — ABNORMAL HIGH (ref 70–179)
HCO3 VENOUS: 21 mmol/L — ABNORMAL LOW (ref 22–27)
HEMOGLOBIN BLOOD GAS: 10.1 g/dL — ABNORMAL LOW (ref 12.00–16.00)
LACTATE BLOOD VENOUS: 1.8 mmol/L (ref 0.5–1.8)
O2 SATURATION VENOUS: 75.8 % (ref 40.0–85.0)
PCO2 VENOUS: 50 mmHg (ref 40–60)
PH VENOUS: 7.26 — ABNORMAL LOW (ref 7.32–7.43)
PO2 VENOUS: 46 mmHg (ref 30–55)
SODIUM WHOLE BLOOD: 142 mmol/L (ref 135–145)

## 2020-07-02 LAB — CBC W/ AUTO DIFF
BASOPHILS ABSOLUTE COUNT: 0 10*9/L (ref 0.0–0.1)
BASOPHILS RELATIVE PERCENT: 0.3 %
EOSINOPHILS ABSOLUTE COUNT: 0 10*9/L (ref 0.0–0.4)
EOSINOPHILS RELATIVE PERCENT: 0 %
HEMATOCRIT: 43.8 % (ref 36.0–46.0)
HEMOGLOBIN: 14.2 g/dL (ref 12.0–16.0)
LARGE UNSTAINED CELLS: 1 % (ref 0–4)
LYMPHOCYTES ABSOLUTE COUNT: 0.9 10*9/L — ABNORMAL LOW (ref 1.5–5.0)
MEAN CORPUSCULAR HEMOGLOBIN: 28.8 pg (ref 26.0–34.0)
MEAN PLATELET VOLUME: 9.2 fL (ref 7.0–10.0)
MONOCYTES RELATIVE PERCENT: 3.3 %
NEUTROPHILS ABSOLUTE COUNT: 7 10*9/L (ref 2.0–7.5)
NEUTROPHILS RELATIVE PERCENT: 84.9 %
PLATELET COUNT: 219 10*9/L (ref 150–440)
RED BLOOD CELL COUNT: 4.92 10*12/L (ref 4.00–5.20)
RED CELL DISTRIBUTION WIDTH: 14.2 % (ref 12.0–15.0)
WBC ADJUSTED: 8.2 10*9/L (ref 4.5–11.0)

## 2020-07-02 LAB — COMPREHENSIVE METABOLIC PANEL
ALBUMIN: 2.7 g/dL — ABNORMAL LOW (ref 3.4–5.0)
ALKALINE PHOSPHATASE: 65 U/L (ref 46–116)
ALT (SGPT): 44 U/L (ref 10–49)
ALT: 43 U/L (ref 0–44)
ANION GAP: 8 mmol/L (ref 5–14)
AST (SGOT): 53 U/L — ABNORMAL HIGH (ref <=34)
AST: 44 U/L — ABNORMAL HIGH (ref 15–41)
Albumin: 3.2 g/dL — ABNORMAL LOW (ref 3.5–5.0)
Alkaline Phosphatase: 55 U/L (ref 38–126)
Anion gap: 16 — ABNORMAL HIGH (ref 5–15)
BILIRUBIN TOTAL: 0.4 mg/dL (ref 0.3–1.2)
BLOOD UREA NITROGEN: 20 mg/dL (ref 9–23)
BUN / CREAT RATIO: 17
BUN: 18 mg/dL (ref 6–20)
CALCIUM: 8.3 mg/dL — ABNORMAL LOW (ref 8.7–10.4)
CHLORIDE: 110 mmol/L — ABNORMAL HIGH (ref 98–107)
CO2: 19 mmol/L — ABNORMAL LOW (ref 22–32)
CO2: 23 mmol/L (ref 20.0–31.0)
CREATININE: 1.17 mg/dL — ABNORMAL HIGH
Calcium: 8.2 mg/dL — ABNORMAL LOW (ref 8.9–10.3)
Chloride: 104 mmol/L (ref 98–111)
Creatinine, Ser: 1.33 mg/dL — ABNORMAL HIGH (ref 0.44–1.00)
EGFR CKD-EPI AA FEMALE: 61 mL/min/{1.73_m2} (ref >=60)
EGFR CKD-EPI NON-AA FEMALE: 53 mL/min/{1.73_m2} — ABNORMAL LOW (ref >=60)
GFR calc Af Amer: 52 mL/min — ABNORMAL LOW (ref >60)
GFR calc non Af Amer: 45 mL/min — ABNORMAL LOW (ref >60)
Glucose, Bld: 164 mg/dL — ABNORMAL HIGH (ref 70–99)
POTASSIUM: 4 mmol/L (ref 3.4–4.5)
PROTEIN TOTAL: 6.8 g/dL (ref 5.7–8.2)
Potassium: 3.9 mmol/L (ref 3.5–5.1)
SODIUM: 141 mmol/L (ref 135–145)
Sodium: 139 mmol/L (ref 135–145)
Total Bilirubin: 0.6 mg/dL (ref 0.3–1.2)
Total Protein: 7 g/dL (ref 6.5–8.1)

## 2020-07-02 LAB — URINALYSIS
BACTERIA: NONE SEEN /HPF
BILIRUBIN UA: NEGATIVE
BLOOD UA: NEGATIVE
GLUCOSE UA: 50 — AB
GRANULAR CASTS: 111 /LPF — ABNORMAL HIGH
HYALINE CASTS: 84 /LPF — ABNORMAL HIGH (ref 0–1)
LEUKOCYTE ESTERASE UA: NEGATIVE
NITRITE UA: NEGATIVE
PH UA: 5 (ref 5.0–9.0)
PROTEIN UA: 100 — AB
RBC UA: 4 /HPF (ref <=4)
SPECIFIC GRAVITY UA: 1.026 (ref 1.003–1.030)
SQUAMOUS EPITHELIAL: <1 /HPF (ref 0–5)
UROBILINOGEN UA: 0.2
WBC UA: 40 /HPF — ABNORMAL HIGH (ref 0–5)

## 2020-07-02 LAB — BLOOD GAS, ARTERIAL
Acid-base deficit: 3.9 mmol/L — ABNORMAL HIGH (ref 0.0–2.0)
BASE EXCESS ARTERIAL: -6.7 — ABNORMAL LOW (ref -2.0–2.0)
Bicarbonate: 21.8 mmol/L (ref 20.0–28.0)
FIO2 ARTERIAL: 100
FIO2: 100
HCO3 ARTERIAL: 17 mmol/L — ABNORMAL LOW (ref 22–27)
O2 Saturation: 97.1 %
PCO2 ARTERIAL: 29.3 mmHg — ABNORMAL LOW (ref 35.0–45.0)
PH ARTERIAL: 7.39 (ref 7.35–7.45)
PO2 ARTERIAL: 115 mmHg — ABNORMAL HIGH (ref 80.0–110.0)
Patient temperature: 37.4
pCO2 arterial: 32.1 mmHg (ref 32.0–48.0)
pH, Arterial: 7.41 (ref 7.350–7.450)
pO2, Arterial: 91.6 mmHg (ref 83.0–108.0)

## 2020-07-02 LAB — INR: Coagulation tissue factor induced.INR:RelTime:Pt:PPP:Qn:Coag: 1.05

## 2020-07-02 LAB — MAGNESIUM: Magnesium:MCnc:Pt:Ser/Plas:Qn:: 2

## 2020-07-02 LAB — CK TOTAL AND CKMB
CK INDEX: 1.1 %
CREATINE KINASE-MB: 2.11 ng/mL (ref 0.00–5.00)

## 2020-07-02 LAB — D-DIMER QUANTITATIVE (CW,ML,HL): Lab: 1722 — ABNORMAL HIGH

## 2020-07-02 LAB — SPECIFIC GRAVITY UA: Specific gravity:Rden:Pt:Urine:Qn:: 1.026

## 2020-07-02 LAB — GLUCOSE WHOLE BLOOD: Glucose:MCnc:Pt:Bld:Qn:: 208 — ABNORMAL HIGH

## 2020-07-02 LAB — CREATINE KINASE TOTAL: Creatine kinase:CCnc:Pt:Ser/Plas:Qn:: 198 — ABNORMAL HIGH

## 2020-07-02 LAB — PCO2 ARTERIAL: Carbon dioxide:PPres:Pt:BldA:Qn:: 29.3 — ABNORMAL LOW

## 2020-07-02 LAB — HEMOGLOBIN: Hemoglobin:MCnc:Pt:Bld:Qn:: 14.2

## 2020-07-02 LAB — ALBUMIN: Albumin:MCnc:Pt:Ser/Plas:Qn:: 2.7 — ABNORMAL LOW

## 2020-07-02 LAB — PHOSPHORUS: Phosphate:MCnc:Pt:Ser/Plas:Qn:: 2.7

## 2020-07-02 LAB — C-REACTIVE PROTEIN
C reactive protein:MCnc:Pt:Ser/Plas:Qn:: 131 — ABNORMAL HIGH
CRP: 13.3 mg/dL — ABNORMAL HIGH (ref <1.0)

## 2020-07-02 LAB — CBC WITH DIFFERENTIAL/PLATELET
Abs Immature Granulocytes: 0.05 10*3/uL (ref 0.00–0.07)
Basophils Absolute: 0 10*3/uL (ref 0.0–0.1)
Basophils Relative: 0 %
Eosinophils Absolute: 0 10*3/uL (ref 0.0–0.5)
Eosinophils Relative: 0 %
HCT: 43.8 % (ref 36.0–46.0)
Hemoglobin: 14.1 g/dL (ref 12.0–15.0)
Immature Granulocytes: 1 %
Lymphocytes Relative: 11 %
Lymphs Abs: 0.8 10*3/uL (ref 0.7–4.0)
MCH: 28.2 pg (ref 26.0–34.0)
MCHC: 32.2 g/dL (ref 30.0–36.0)
MCV: 87.6 fL (ref 80.0–100.0)
Monocytes Absolute: 0.3 10*3/uL (ref 0.1–1.0)
Monocytes Relative: 5 %
Neutro Abs: 6.1 10*3/uL (ref 1.7–7.7)
Neutrophils Relative %: 83 %
Platelets: 181 10*3/uL (ref 150–400)
RBC: 5 MIL/uL (ref 3.87–5.11)
RDW: 14.1 % (ref 11.5–15.5)
WBC: 7.4 10*3/uL (ref 4.0–10.5)
nRBC: 0 % (ref 0.0–0.2)

## 2020-07-02 LAB — LACTIC ACID, PLASMA: Lactic Acid, Venous: 1.7 mmol/L (ref 0.5–1.9)

## 2020-07-02 LAB — D-DIMER, QUANTITATIVE: D-Dimer, Quant: 1.05 ug/mL-FEU — ABNORMAL HIGH (ref 0.00–0.50)

## 2020-07-02 LAB — TRIGLYCERIDES: Triglycerides: 160 mg/dL — ABNORMAL HIGH (ref <150)

## 2020-07-02 LAB — SARS CORONAVIRUS 2 BY RT PCR (HOSPITAL ORDER, PERFORMED IN ~~LOC~~ HOSPITAL LAB): SARS Coronavirus 2: POSITIVE — AB

## 2020-07-02 LAB — FIBRINOGEN: Fibrinogen: 546 mg/dL — ABNORMAL HIGH (ref 210–475)

## 2020-07-02 LAB — FERRITIN: Ferritin: 968 ng/mL — ABNORMAL HIGH (ref 11–307)

## 2020-07-02 LAB — LACTATE DEHYDROGENASE: LDH: 541 U/L — ABNORMAL HIGH (ref 98–192)

## 2020-07-02 LAB — PROCALCITONIN: Procalcitonin: 0.28 ng/mL

## 2020-07-02 MED ORDER — PROPOFOL 1000 MG/100ML IV EMUL: 5.0000 ug/kg/min | INTRAVENOUS | Status: DC

## 2020-07-02 MED ORDER — SODIUM CHLORIDE 0.9 % IV BOLUS
1000.0000 mL | Freq: Once | INTRAVENOUS | Status: AC
  Administered 2020-07-02: 1000 mL via INTRAVENOUS

## 2020-07-02 MED ORDER — FENTANYL CITRATE (PF) 100 MCG/2ML IJ SOLN: 50.0000 ug | INTRAMUSCULAR | Status: DC | PRN

## 2020-07-02 MED ORDER — ROCURONIUM BROMIDE 50 MG/5ML IV SOLN
100.0000 mg | Freq: Once | INTRAVENOUS | Status: AC
  Administered 2020-07-02: 100 mg via INTRAVENOUS
  Filled 2020-07-02: qty 10

## 2020-07-02 MED ORDER — SODIUM CHLORIDE 0.9 % IV SOLN
100.0000 mg | Freq: Every day | INTRAVENOUS | Status: DC
  Filled 2020-07-02: qty 20

## 2020-07-02 MED ORDER — PROPOFOL 1000 MG/100ML IV EMUL
INTRAVENOUS | Status: AC
  Filled 2020-07-02: qty 100

## 2020-07-02 MED ORDER — ETOMIDATE 2 MG/ML IV SOLN
20.0000 mg | Freq: Once | INTRAVENOUS | Status: AC
  Administered 2020-07-02: 20 mg via INTRAVENOUS

## 2020-07-02 MED ORDER — SODIUM CHLORIDE 0.9 % IV SOLN
100.0000 mg | INTRAVENOUS | Status: AC
  Administered 2020-07-02 (×2): 100 mg via INTRAVENOUS
  Filled 2020-07-02 (×2): qty 20

## 2020-07-02 MED ORDER — DEXAMETHASONE SODIUM PHOSPHATE 10 MG/ML IJ SOLN
10.0000 mg | Freq: Once | INTRAMUSCULAR | Status: AC
  Administered 2020-07-02: 10 mg via INTRAVENOUS
  Filled 2020-07-02: qty 1

## 2020-07-02 MED ORDER — PHENYLEPHRINE 40 MCG/ML (10ML) SYRINGE FOR IV PUSH (FOR BLOOD PRESSURE SUPPORT): 80.0000 ug | PREFILLED_SYRINGE | Freq: Once | INTRAVENOUS | Status: DC | PRN

## 2020-07-02 MED ADMIN — midazolam in sodium chloride 0.9% (1 mg/mL) infusion PMB: 0-6 mg/h | INTRAVENOUS | @ 23:00:00

## 2020-07-02 NOTE — ED Notes (Signed)
Have contacted [facility] nurse Irving Burton to update on intubation

## 2020-07-02 NOTE — ED Triage Notes (Signed)
Pt reports covid positive since Saturday.  C/O weakness, fever, and generalized body aches.  REports history of liver transplant and was told if her fever got to 102 she needed to go to the doctor.

## 2020-07-02 NOTE — Consult Note (Signed)
Consult Note   Cynthia Stephenson CXK:[id number] DOB: 17-Nov-1966 DOA: 07/02/2020  PCP: Joette Catching, MD  Patient coming from: home  I have personally briefly reviewed patient's old medical records in [facility]  Chief Complaint: shortness of breath  HPI: Cynthia Stephenson is a 54 y.o. female with medical history significant of cirrhosis status post liver transplant on chronic immunosuppressive therapy, hypertension, diabetes, hypothyroidism who reports experiencing myalgias, sore throat, loss of taste and fevers for the past 5 days.  Approximately 2 days ago, she began developing shortness of breath and nonproductive cough.  When her symptoms had started, she took a home Covid test on 7/17 which was found to be positive.  She is not had any nausea, vomiting, diarrhea.  She has had poor p.o. intake for the last several days.  Her husband also has similar symptoms.  ED Course: She was evaluated in the emergency room where she was noted to be severely hypoxic with O2 saturations of 50% on room air.  She was placed on a nonrebreather and current saturations are in the low 90s.  Chest x-ray shows bilateral infiltrates consistent with COVID-19 pneumonia.  Her Covid test is positive.  Inflammatory markers are elevated.  She received a dose of Decadron in the emergency room.  Admission has been requested.  Review of Systems: As per HPI otherwise 10 point review of systems negative.    Past Medical History:  Diagnosis Date  . Cirrhosis (HCC)   . HTN (hypertension) 07/02/2020  . Hypothyroidism   . Seizures (HCC)    x1 in Dec 2015  . Thrombocytopenia (HCC)     Past Surgical History:  Procedure Laterality Date  . ABDOMINAL HYSTERECTOMY    . CARDIAC CATHETERIZATION    . CESAREAN SECTION     x2  . CESAREAN SECTION    . cesearin section    . ESOPHAGOGASTRODUODENOSCOPY N/A 05/01/2015   Procedure: ESOPHAGOGASTRODUODENOSCOPY (EGD);  Surgeon: Malissa Hippo, MD;  [facility];  Service:  Endoscopy;  Laterality: N/A;  . LIVER BIOPSY  02/16/2014  . LIVER TRANSPLANT  06/24/15   [facility]    Social History:  reports that she has never smoked. She has never used smokeless tobacco. She reports that she does not drink alcohol and does not use drugs.  No Known Allergies  Family History  Problem Relation Age of Onset  . Breast cancer Mother   . Ovarian cancer Mother   . Thyroid cancer Mother   . Diabetes Father   . Heart disease Father   . Skin cancer Father   . Celiac disease Paternal Aunt     Prior to Admission medications   Medication Sig Start Date End Date Taking? Authorizing Provider  acetaminophen (TYLENOL) 325 MG tablet Take by mouth. As needed    [provider]  amLODipine (NORVASC) 5 MG tablet Take 5 mg by mouth daily.    [provider]  aspirin EC 81 MG tablet Take 81 mg by mouth daily.    [provider]  Cholecalciferol (VITAMIN D3) 2000 UNITS capsule Take 2,000 Units by mouth.    [provider]  levETIRAcetam (KEPPRA) 250 MG tablet Take 1 tablet (250 mg total) by mouth 2 (two) times daily. 07/18/16   Penumalli, Glenford Bayley, MD  levothyroxine (SYNTHROID, LEVOTHROID) 137 MCG tablet TAKE ONE TABLET BY MOUTH ONCE DAILY 03/03/17   [provider]  LORazepam (ATIVAN) 0.5 MG tablet Take 0.5 mg by mouth 2 (two) times daily. Take up to  two a day as needed    [provider]  magnesium oxide (MAG-OX) 400 MG tablet Take 400 mg by mouth daily.    [provider]  metFORMIN (GLUCOPHAGE-XR) 500 MG 24 hr tablet Take 250 mg by mouth every evening. Patient states that she takes at 7 pm. 03/21/18   [provider]  mycophenolate (MYFORTIC) 180 MG EC tablet Take 180 mg by mouth. Patient states that she takes 2 in the morning and 2 at night.    [provider]  pravastatin (PRAVACHOL) 10 MG tablet Take 10 mg by mouth daily.    [provider]  tacrolimus (PROGRAF) 1 MG capsule Take 5 mg by mouth.  Patient states that she is taking 3 in the morning and 2 at night. 09/18/15   [provider]    Physical Exam: Vitals:   07/02/20 0915 07/02/20 1000 07/02/20 1030 07/02/20 1130  BP: (!) 115/58 119/65 117/68 (!) 107/57  Pulse: 90 85 85 86  Resp: (!) 50 (!) 39 (!) 45 (!) 41  Temp:      TempSrc:      SpO2: 90% 91% (!) 87% (!) 84%  Weight:      Height:        Constitutional: NAD, calm, comfortable Eyes: PERRL, lids and conjunctivae normal ENMT: Mucous membranes are moist. Posterior pharynx clear of any exudate or lesions.Normal dentition.  Neck: normal, supple, no masses, no thyromegaly Respiratory: clear to auscultation bilaterally, no wheezing, no crackles. Mild increased respiratory effort. No accessory muscle use.  Cardiovascular: Regular rate and rhythm, no murmurs / rubs / gallops. No extremity edema. 2+ pedal pulses. No carotid bruits.  Abdomen: no tenderness, no masses palpated. No hepatosplenomegaly. Bowel sounds positive.  Musculoskeletal: no clubbing / cyanosis. No joint deformity upper and lower extremities. Good ROM, no contractures. Normal muscle tone.  Skin: no rashes, lesions, ulcers. No induration Neurologic: CN 2-12 grossly intact. Sensation intact, DTR normal. Strength 5/5 in all 4.  Psychiatric: Normal judgment and insight. Alert and oriented x 3. Normal mood.    Labs on Admission: I have personally reviewed following labs and imaging studies  CBC: Recent Labs  Lab 07/02/20 0917  WBC 7.4  NEUTROABS 6.1  HGB 14.1  HCT 43.8  MCV 87.6  PLT 181   Basic Metabolic Panel: Recent Labs  Lab 07/02/20 0917  NA 139  K 3.9  CL 104  CO2 19*  GLUCOSE 164*  BUN 18  CREATININE 1.33*  CALCIUM 8.2*   GFR: Estimated Creatinine Clearance: 54.8 mL/min (A) (by C-G formula based on SCr of 1.33 mg/dL (H)). Liver Function Tests: Recent Labs  Lab 07/02/20 0917  AST 44*  ALT 43  ALKPHOS 55  BILITOT 0.6  PROT 7.0  ALBUMIN 3.2*   No results for input(s):  LIPASE, AMYLASE in the last 168 hours. No results for input(s): AMMONIA in the last 168 hours. Coagulation Profile: No results for input(s): INR, PROTIME in the last 168 hours. Cardiac Enzymes: No results for input(s): CKTOTAL, CKMB, CKMBINDEX, TROPONINI in the last 168 hours. BNP (last 3 results) No results for input(s): PROBNP in the last 8760 hours. HbA1C: No results for input(s): HGBA1C in the last 72 hours. CBG: No results for input(s): GLUCAP in the last 168 hours. Lipid Profile: Recent Labs    07/02/20 0918  TRIG 160*   Thyroid Function Tests: No results for input(s): TSH, T4TOTAL, FREET4, T3FREE, THYROIDAB in the last 72 hours. Anemia Panel: Recent Labs  07/02/20 0918  FERRITIN 968*   Urine analysis:    Component Value Date/Time   COLORURINE YELLOW 06/15/2015 1351   APPEARANCEUR CLEAR 06/15/2015 1351   LABSPEC 1.010 06/15/2015 1351   PHURINE 5.5 06/15/2015 1351   GLUCOSEU NEGATIVE 06/15/2015 1351   HGBUR NEGATIVE 06/15/2015 1351   BILIRUBINUR NEGATIVE 06/15/2015 1351   KETONESUR NEGATIVE 06/15/2015 1351   PROTEINUR NEGATIVE 06/15/2015 1351   UROBILINOGEN 0.2 06/15/2015 1351   NITRITE NEGATIVE 06/15/2015 1351   LEUKOCYTESUR TRACE (A) 06/15/2015 1351    Radiological Exams on Admission: DG Chest Port 1 View  Result Date: 07/02/2020 CLINICAL DATA:  COVID positivity EXAM: PORTABLE CHEST 1 VIEW COMPARISON:  06/15/2015 FINDINGS: Cardiac shadow is enlarged. Patchy opacities are noted in both lungs consistent with the given clinical history. No sizable effusion is noted. No bony abnormality is seen. IMPRESSION: Patchy opacities bilaterally consistent with the given clinical history of COVID-19 positivity. Electronically Signed   By: Alcide CleverMark  Lukens M.D.   On: 07/02/2020 09:24    EKG: Independently reviewed. Sinus rhythm without acute changes  Assessment/Plan Active Problems:   Hypothyroidism   Seizure disorder (HCC)   Acute respiratory failure with hypoxia (HCC)    Pneumonia due to COVID-19 virus   Liver transplant status (HCC)   Immunocompromised state due to drug therapy   HTN (hypertension)   Type 2 diabetes mellitus with hyperlipidemia (HCC)   Obesity, Class III, BMI 40-49.9 (morbid obesity) (HCC)   OSA on CPAP     1. Acute respiratory failure with hypoxia secondary to COVID-19 pneumonia.  Patient is currently on a nonrebreather.  She is received a dose of Decadron.  We will continue on Solu-Medrol as well as remdesivir.  Case reviewed with Dr. Sherene SiresWert with pulmonology and due to her multiple comorbidities and high oxygen requirement, her risk for further decompensation is high.  It was recommended that patient be transferred to a higher level of care.  Case was reviewed with [facility] Chandra BatchFischer[facility] the patient in transfer.  Arrangements are being made for transport. Requested that ABG be checked and depending on results, patient may need to be intubated prior to transport in order to secure airway. 2. History of liver transplant.  Continue on mycophenolate and tacrolimus. 3. Diabetes.  Currently on Metformin.  Will hold for now.  Continue on sliding scale insulin. 4. Seizure disorder.  Continue on home dose of Keppra 5. Hypothyroidism.  Continue on Synthroid 6. Hyperlipidemia.  We will continue on statin 7. Hypertension.  She is chronically on amlodipine.  Blood pressure low normal.  Hold amlodipine for now. 8. Morbid obesity.  Would benefit from diet and exercise.  BMI of 42 9. Obstructive sleep apnea on CPAP.  DVT prophylaxis: lovenox Code Status: full code  Family Communication:  Discussed with patient's husband and updated on patient's condition.  Disposition Plan: [facility]. Consults called: case discussed with Dr. Sherene SiresWert  Admission status: inpatient, icu    Critical care procedure note Authorized and performed by: Erick BlinksJehanzeb Jammi Morrissette Total critical care time:  Approximately 60 minutes Due to high probability of clinically significant, life-threatening deterioration, the patient required my highest level of preparedness to intervene emergently and I personally spent this critical care time directly and personally managing the patient.  The critical care time included obtaining a history, examining the patient, pulse oximetry, ordering and review of studies, arranging urgent treatment with development of a management plan, evaluation  of patient's response to treatment, frequent reassessment, discussions with other providers.  Critical care time was performed to assess and manage the high probability of imminent, life-threatening deterioration that could result in multiorgan failure.  It was exclusive of separate billable procedures and treating other patients and teaching time.  Please see MDM section and the rest of the of note for further information on patient assessment and treatment   Erick Blinks MD [facility]   If 7PM-7AM, please contact night-coverage [email or web address]   07/02/2020, 12:01 PM

## 2020-07-02 NOTE — ED Notes (Signed)
Have notified pharmacy of Remdesivir order

## 2020-07-02 NOTE — ED Notes (Signed)
Per Dr. Kerry Hough, take patient off hi flow [place] and put back on nonrebreather. Pt now on nonrebreather and O2 sats have improved. Respiratory notified of blood gas.

## 2020-07-02 NOTE — ED Provider Notes (Addendum)
[facility] EMERGENCY DEPARTMENT Provider Note   CSN: [id number] Arrival date & time: 07/02/20  0805     History Chief Complaint  Patient presents with  . covid    Cynthia Stephenson is a 54 y.o. female.  Patient is a 54 year old female with past medical history of cirrhosis with liver transplant, hypothyroidism, seizures. She presents today for evaluation of shortness of breath. Patient has been sick with cold-like symptoms for the past week. She had a positive Covid test several days ago. She has become more short of breath over the past 2 days. She describes cough. She denies any fevers or chills. She is here with her husband who was ill in a similar fashion.  The history is provided by the patient.       Past Medical History:  Diagnosis Date  . Cirrhosis (HCC)   . Hypothyroidism   . Seizures (HCC)    x1 in Dec 2015  . Thrombocytopenia [facility])     Patient Active Problem List   Diagnosis Date Noted  . Weakness 06/15/2015  . Obesity, morbid (HCC) 06/04/2015  . Pulmonary edema, noncardiac 06/03/2015  . Edema 06/02/2015  . Anasarca 06/02/2015  . Volume overload 05/24/2015  . Hyponatremia 05/24/2015  . Anemia of chronic disease 05/24/2015  . Peripheral edema   . Decompensation of cirrhosis of liver (HCC) 04/28/2015  . Liver failure (HCC)   . Dyspnea 03/20/2015  . Thrombocytopenia (HCC) 03/20/2015  . Cirrhosis of liver with ascites (HCC) 03/19/2015  . Ascites 03/19/2015  . Seizure disorder (HCC) 02/20/2015  . Other cirrhosis of liver (HCC)   . Seizures (HCC) 02/10/2015  . Hypothyroidism 02/10/2015  . Depression 02/10/2015  . Chest pain 01/27/2015    Past Surgical History:  Procedure Laterality Date  . ABDOMINAL HYSTERECTOMY    . CARDIAC CATHETERIZATION    . CESAREAN SECTION     x2  . CESAREAN SECTION    . cesearin section    . ESOPHAGOGASTRODUODENOSCOPY N/A 05/01/2015   Procedure: ESOPHAGOGASTRODUODENOSCOPY (EGD);  Surgeon: Malissa Hippo, MD;  Location[facility];  Service: Endoscopy;  Laterality: N/A;  . LIVER BIOPSY  02/16/2014  . LIVER TRANSPLANT  06/24/15   [facility]     OB History   No obstetric history on file.     Family History  Problem Relation Age of Onset  . Breast cancer Mother   . Ovarian cancer Mother   . Thyroid cancer Mother   . Diabetes Father   . Heart disease Father   . Skin cancer Father   . Celiac disease Paternal Aunt     Social History   Tobacco Use  . Smoking status: Never Smoker  . Smokeless tobacco: Never Used  Substance Use Topics  . Alcohol use: No    Alcohol/week: 0.0 standard drinks  . Drug use: No    Home Medications Prior to Admission medications   Medication Sig Start Date End Date Taking? Authorizing Provider  acetaminophen (TYLENOL) 325 MG tablet Take by mouth. As needed    [provider]  amLODipine (NORVASC) 5 MG tablet Take 5 mg by mouth daily.    [provider]  aspirin EC 81 MG tablet Take 81 mg by mouth daily.    [provider]  Cholecalciferol (VITAMIN D3) 2000 UNITS capsule Take 2,000 Units by mouth.    [provider]  levETIRAcetam (KEPPRA) 250 MG tablet Take 1 tablet (250 mg total) by mouth 2 (two) times daily. 07/18/16   Penumalli,  Glenford Bayley, MD  levothyroxine (SYNTHROID, LEVOTHROID) 137 MCG tablet TAKE ONE TABLET BY MOUTH ONCE DAILY 03/03/17   [provider]  LORazepam (ATIVAN) 0.5 MG tablet Take 0.5 mg by mouth 2 (two) times daily. Take up to two a day as needed    [provider]  magnesium oxide (MAG-OX) 400 MG tablet Take 400 mg by mouth daily.    [provider]  metFORMIN (GLUCOPHAGE-XR) 500 MG 24 hr tablet Take 250 mg by mouth every evening. Patient states that she takes at 7 pm. 03/21/18   [provider]  mycophenolate (MYFORTIC) 180 MG EC tablet Take 180 mg by mouth. Patient states that she takes 2 in the morning and 2 at night.    [provider]  pravastatin (PRAVACHOL) 10 MG tablet Take 10  mg by mouth daily.    [provider]  tacrolimus (PROGRAF) 1 MG capsule Take 5 mg by mouth. Patient states that she is taking 3 in the morning and 2 at night. 09/18/15   [provider]    Allergies    Patient has no known allergies.  Review of Systems   Review of Systems  All other systems reviewed and are negative.   Physical Exam Updated Vital Signs BP 120/72 (BP Location: Left Arm)   Pulse 93   Temp 99.4 F (37.4 C) (Oral)   Resp (!) 32   Ht 5\' 2"  (1.575 m)   Wt 104.3 kg   SpO2 (!) 85%   BMI 42.07 kg/m   Physical Exam Vitals and nursing note reviewed.  Constitutional:      General: She is not in acute distress.    Appearance: She is well-developed. She is not diaphoretic.  HENT:     Head: Normocephalic and atraumatic.  Cardiovascular:     Rate and Rhythm: Normal rate and regular rhythm.     Heart sounds: No murmur heard.  No friction rub. No gallop.   Pulmonary:     Effort: Respiratory distress present.     Breath sounds: Rhonchi present. No wheezing.     Comments: Patient in mild respiratory distress. She does become winded answering questions. Abdominal:     General: Bowel sounds are normal. There is no distension.     Palpations: Abdomen is soft.     Tenderness: There is no abdominal tenderness.  Musculoskeletal:        General: Normal range of motion.     Cervical back: Normal range of motion and neck supple.  Skin:    General: Skin is warm and dry.  Neurological:     Mental Status: She is alert and oriented to person, place, and time.     ED Results / Procedures / Treatments   Labs (all labs ordered are listed, but only abnormal results are displayed) Labs Reviewed  SARS CORONAVIRUS 2 BY RT PCR (HOSPITAL ORDER, PERFORMED IN [place] [facility])  CULTURE, BLOOD (ROUTINE X 2)  CULTURE, BLOOD (ROUTINE X 2)  LACTIC ACID, PLASMA  LACTIC ACID, PLASMA  CBC WITH DIFFERENTIAL/PLATELET  COMPREHENSIVE METABOLIC PANEL  D-DIMER,  QUANTITATIVE (NOT AT [facility])  PROCALCITONIN  LACTATE DEHYDROGENASE  FERRITIN  TRIGLYCERIDES  FIBRINOGEN  C-REACTIVE PROTEIN  POC URINE PREG, ED    EKG EKG Interpretation  Date/Time:  Thursday July 02 2020 09:00:56 EDT Ventricular Rate:  93 PR Interval:    QRS Duration: 91 QT Interval:  419 QTC Calculation: 522 R Axis:   80 Text Interpretation: Sinus rhythm Probable left  atrial enlargement Nonspecific T wave abnormality lateral leads Confirmed by Geoffery Lyons ([id number]) on 07/02/2020 9:09:38 AM   Radiology No results found.  Procedures Procedures (including critical care time)  Medications Ordered in ED Medications - No data to display  ED Course  I have reviewed the triage vital signs and the nursing notes.  Pertinent labs & imaging results that were available during my care of the patient were reviewed by me and considered in my medical decision making (see chart for details).    MDM Rules/Calculators/A&P  Patient with recent diagnosis of COVID-19 presenting with complaints of shortness of breath.  Patient is significantly hypoxic on presentation with oxygen saturations in the 50s.  She was placed on nonrebreather with improvement of her saturations into the low 90s.  Patient's work-up shows elevations of her inflammatory markers and chest x-ray that is consistent with bilateral patchy opacities consistent with COVID-19.  Patient continued on 100% oxygen by nonrebreather here in the ER.  I have spoken with Dr. Kerry Hough from the Hospitalist service.  He has evaluated the patient and discussed the care with pulmonology.  They have suggested consultation with the ICU service at [facility] where her transplant was performed.  They have recommended transfer to [facility] for further management and care.  She was accepted by Al[facility] St[facility].   an ABG was obtained showing a pH of 7.4 with a PO2 of 92 and PCO2 of 33.  Patient awaiting transport to [facility].  I have received recommendations from Dr.  Chandra Batch that the patient be intubated prior to transport.  Patient given RSI with etomidate and rocuronium.  Using the glide scope, a 7.5 endotracheal tube was easily placed with tube placement confirmed with direct visualization of the tube passing the cords and end-tidal CO2.  Post intubation chest x-ray does show a right mainstem intubation and the tube was retracted prior to transport.  CRITICAL CARE Performed by: Geoffery Lyons Total critical care time: 90 minutes Critical care time was exclusive of separately billable procedures and treating other patients. Critical care was necessary to treat or prevent imminent or life-threatening deterioration. Critical care was time spent personally by me on the following activities: development of treatment plan with patient and/or surrogate as well as nursing, discussions with consultants, evaluation of patient's response to treatment, examination of patient, obtaining history from patient or surrogate, ordering and performing treatments and interventions, ordering and review of laboratory studies, ordering and review of radiographic studies, pulse oximetry and re-evaluation of patient's condition.   Cynthia Stephenson was evaluated in Emergency Department on 07/02/2020 for the symptoms described in the history of present illness. She was evaluated in the context of the global COVID-19 pandemic, which necessitated consideration that the patient might be at risk for infection with the SARS-CoV-2 virus that causes COVID-19. Institutional protocols and algorithms that pertain to the evaluation of patients at risk for COVID-19 are in a state of rapid change based on information released by regulatory bodies including the CDC and federal and state organizations. These policies and algorithms were followed during the patient's care in the ED.   Final Clinical Impression(s) / ED Diagnoses Final diagnoses:  None    Rx / DC Orders ED Discharge Orders    None         Geoffery Lyons, MD 07/02/20 1453    Geoffery Lyons, MD 07/02/20 1511    Geoffery Lyons, MD 07/03/20 [phone]

## 2020-07-03 LAB — COMPREHENSIVE METABOLIC PANEL
ALBUMIN: 2.5 g/dL — ABNORMAL LOW (ref 3.4–5.0)
ALKALINE PHOSPHATASE: 59 U/L (ref 46–116)
ALT (SGPT): 36 U/L (ref 10–49)
ANION GAP: 7 mmol/L (ref 5–14)
AST (SGOT): 41 U/L — ABNORMAL HIGH (ref <=34)
BILIRUBIN TOTAL: 0.3 mg/dL (ref 0.3–1.2)
BLOOD UREA NITROGEN: 24 mg/dL — ABNORMAL HIGH (ref 9–23)
BUN / CREAT RATIO: 18
CHLORIDE: 112 mmol/L — ABNORMAL HIGH (ref 98–107)
CREATININE: 1.33 mg/dL — ABNORMAL HIGH
EGFR CKD-EPI AA FEMALE: 52 mL/min/{1.73_m2} — ABNORMAL LOW (ref >=60)
EGFR CKD-EPI NON-AA FEMALE: 45 mL/min/{1.73_m2} — ABNORMAL LOW (ref >=60)
GLUCOSE RANDOM: 188 mg/dL — ABNORMAL HIGH (ref 70–179)
POTASSIUM: 4 mmol/L (ref 3.4–4.5)
PROTEIN TOTAL: 6.3 g/dL (ref 5.7–8.2)
SODIUM: 142 mmol/L (ref 135–145)

## 2020-07-03 LAB — HEMOGLOBIN A1C: HEMOGLOBIN A1C: 5.6 % (ref 4.8–5.6)

## 2020-07-03 LAB — BLOOD GAS, ARTERIAL
BASE EXCESS ARTERIAL: -4.8 — ABNORMAL LOW (ref -2.0–2.0)
BASE EXCESS ARTERIAL: -6.4 — ABNORMAL LOW (ref -2.0–2.0)
BASE EXCESS ARTERIAL: -7.5 — ABNORMAL LOW (ref -2.0–2.0)
BASE EXCESS ARTERIAL: -6.5 — ABNORMAL LOW (ref -2.0–2.0)
BASE EXCESS ARTERIAL: -6.5 — ABNORMAL LOW (ref -2.0–2.0)
FIO2 ARTERIAL: 100
FIO2 ARTERIAL: 100
FIO2 ARTERIAL: 100
HCO3 ARTERIAL: 20 mmol/L — ABNORMAL LOW (ref 22–27)
HCO3 ARTERIAL: 18 mmol/L — ABNORMAL LOW (ref 22–27)
HCO3 ARTERIAL: 19 mmol/L — ABNORMAL LOW (ref 22–27)
HCO3 ARTERIAL: 18 mmol/L — ABNORMAL LOW (ref 22–27)
HCO3 ARTERIAL: 20 mmol/L — ABNORMAL LOW (ref 22–27)
O2 SATURATION ARTERIAL: 93.5 % — ABNORMAL LOW (ref 94.0–100.0)
O2 SATURATION ARTERIAL: 96.7 % (ref 94.0–100.0)
O2 SATURATION ARTERIAL: 95.6 % (ref 94.0–100.0)
O2 SATURATION ARTERIAL: 95.6 % (ref 94.0–100.0)
O2 SATURATION ARTERIAL: 92.4 % — ABNORMAL LOW (ref 94.0–100.0)
PCO2 ARTERIAL: 45.1 mmHg — ABNORMAL HIGH (ref 35.0–45.0)
PCO2 ARTERIAL: 42.1 mmHg (ref 35.0–45.0)
PCO2 ARTERIAL: 39.7 mmHg (ref 35.0–45.0)
PCO2 ARTERIAL: 40.1 mmHg (ref 35.0–45.0)
PCO2 ARTERIAL: 36.3 mmHg (ref 35.0–45.0)
PCO2 ARTERIAL: 41.6 mmHg (ref 35.0–45.0)
PH ARTERIAL: 7.29 — ABNORMAL LOW (ref 7.35–7.45)
PH ARTERIAL: 7.29 — ABNORMAL LOW (ref 7.35–7.45)
PH ARTERIAL: 7.28 — ABNORMAL LOW (ref 7.35–7.45)
PH ARTERIAL: 7.33 — ABNORMAL LOW (ref 7.35–7.45)
PO2 ARTERIAL: 72 mmHg — ABNORMAL LOW (ref 80.0–110.0)
PO2 ARTERIAL: 93.7 mmHg (ref 80.0–110.0)
PO2 ARTERIAL: 85.4 mmHg (ref 80.0–110.0)
PO2 ARTERIAL: 83.6 mmHg (ref 80.0–110.0)
PO2 ARTERIAL: 67.5 mmHg — ABNORMAL LOW (ref 80.0–110.0)

## 2020-07-03 LAB — ESTIMATED AVERAGE GLUCOSE: Estimated average glucose:MCnc:Pt:Bld:Qn:Estimated from glycated hemoglobin: 114

## 2020-07-03 LAB — PROTEIN TOTAL: Protein:MCnc:Pt:Ser/Plas:Qn:: 6.3

## 2020-07-03 LAB — MEAN CORPUSCULAR HEMOGLOBIN CONC: Erythrocyte mean corpuscular hemoglobin concentration:MCnc:Pt:RBC:Qn:Automated count: 32.2

## 2020-07-03 LAB — O2 SATURATION ARTERIAL
Oxygen saturation:MFr:Pt:BldA:Qn:: 95.6
Oxygen saturation:MFr:Pt:BldA:Qn:: 96.7

## 2020-07-03 LAB — TACROLIMUS BLOOD: Lab: 1.8

## 2020-07-03 LAB — CBC
HEMATOCRIT: 40 % (ref 36.0–46.0)
HEMOGLOBIN: 12.9 g/dL (ref 12.0–16.0)
MEAN CORPUSCULAR HEMOGLOBIN: 28.7 pg (ref 26.0–34.0)
MEAN CORPUSCULAR VOLUME: 89 fL (ref 80.0–100.0)
MEAN PLATELET VOLUME: 10 fL (ref 7.0–10.0)
PLATELET COUNT: 214 10*9/L (ref 150–440)
RED BLOOD CELL COUNT: 4.49 10*12/L (ref 4.00–5.20)
RED CELL DISTRIBUTION WIDTH: 14.2 % (ref 12.0–15.0)
WBC ADJUSTED: 5.8 10*9/L (ref 4.5–11.0)

## 2020-07-03 LAB — SPECIMEN SOURCE

## 2020-07-03 LAB — PHOSPHORUS: Phosphate:MCnc:Pt:Ser/Plas:Qn:: 3.1

## 2020-07-03 LAB — MAGNESIUM: Magnesium:MCnc:Pt:Ser/Plas:Qn:: 1.9

## 2020-07-03 LAB — HCO3 ARTERIAL
Bicarbonate:SCnc:Pt:BldA:Qn:: 19 — ABNORMAL LOW
Bicarbonate:SCnc:Pt:BldA:Qn:: 20 — ABNORMAL LOW

## 2020-07-03 LAB — BASE EXCESS ARTERIAL: Base excess:SCnc:Pt:BldA:Qn:Calculated: -6.4 — ABNORMAL LOW

## 2020-07-03 LAB — UREA NITROGEN URINE: Lab: 536

## 2020-07-03 MED ADMIN — HYDROmorphone 1 mg/mL in 0.9% sodium chloride: 0-10 mg/h | INTRAVENOUS | @ 05:00:00 | Stop: 2020-07-16

## 2020-07-03 MED ADMIN — oxyCODONE (ROXICODONE) immediate release tablet 20 mg: 20 mg | ORAL | @ 13:00:00 | Stop: 2020-07-17

## 2020-07-03 MED ADMIN — oxyCODONE (ROXICODONE) immediate release tablet 20 mg: 20 mg | ORAL | @ 16:00:00 | Stop: 2020-07-17

## 2020-07-03 MED ADMIN — lactated ringers bolus 500 mL: 500 mL | INTRAVENOUS | @ 20:00:00 | Stop: 2020-07-03

## 2020-07-03 MED ADMIN — HYDROmorphone 1 mg/mL in 0.9% sodium chloride: 0-10 mg/h | INTRAVENOUS | @ 18:00:00 | Stop: 2020-07-16

## 2020-07-03 MED ADMIN — LORazepam (ATIVAN) tablet 2 mg: 2 mg | ORAL | @ 20:00:00

## 2020-07-03 MED ADMIN — polyethylene glycol (MIRALAX) packet 17 g: 17 g | ORAL | @ 17:00:00

## 2020-07-03 MED ADMIN — remdesivir (VEKLURY) 100 mg in sodium chloride (NS) 0.9 % 295 mL IVPB: 100 mg | INTRAVENOUS | @ 03:00:00 | Stop: 2020-07-06

## 2020-07-03 MED ADMIN — aspirin chewable tablet 81 mg: 81 mg | ORAL | @ 17:00:00

## 2020-07-03 MED ADMIN — LORazepam (ATIVAN) tablet 2 mg: 2 mg | ORAL | @ 13:00:00

## 2020-07-03 MED ADMIN — dexamethasone (DECADRON) 4 mg/mL injection 6 mg: 6 mg | INTRAVENOUS | @ 01:00:00 | Stop: 2020-07-12

## 2020-07-03 MED ADMIN — norepinephrine 8 mg in sodium chloride 0.9 % 250 mL (32mcg/mL) infusion PMB: 0-30 ug/min | INTRAVENOUS | @ 20:00:00

## 2020-07-03 MED ADMIN — polyethylene glycol (MIRALAX) packet 17 g: 17 g | ORAL | @ 13:00:00

## 2020-07-03 MED ADMIN — levothyroxine (SYNTHROID) tablet 100 mcg: 100 ug | GASTROENTERAL | @ 17:00:00

## 2020-07-03 MED ADMIN — dexamethasone (DECADRON) 4 mg/mL injection 6 mg: 6 mg | INTRAVENOUS | @ 22:00:00 | Stop: 2020-07-12

## 2020-07-03 MED ADMIN — HYDROmorphone 1 mg/mL in 0.9% sodium chloride: 0-10 mg/h | INTRAVENOUS | @ 08:00:00 | Stop: 2020-07-16

## 2020-07-03 MED ADMIN — remdesivir (VEKLURY) 100 mg in sodium chloride (NS) 0.9 % 295 mL IVPB: 100 mg | INTRAVENOUS | @ 13:00:00 | Stop: 2020-07-06

## 2020-07-03 MED ADMIN — chlorhexidine (PERIDEX) 0.12 % solution 5 mL: 5 mL | OROMUCOSAL | @ 12:00:00

## 2020-07-03 NOTE — Unmapped (Incomplete)
Received patient at approx 1808. Vital signs taken, see flowsheet for information. MDI to place arterial line and CVAD. Monitoring at this time.   Problem: Adult Inpatient Plan of Care  Goal: Plan of Care Review  Outcome: Ongoing - Unchanged  Goal: Patient-Specific Goal (Individualization)  Outcome: Ongoing - Unchanged  Goal: Absence of Hospital-Acquired Illness or Injury  Outcome: Ongoing - Unchanged  Goal: Optimal Comfort and Wellbeing  Outcome: Ongoing - Unchanged  Goal: Readiness for Transition of Care  Outcome: Ongoing - Unchanged  Goal: Rounds/Family Conference  Outcome: Ongoing - Unchanged

## 2020-07-03 NOTE — Unmapped (Signed)
MICU History & Physical     Date of Service: 07/03/2020    Problem List:       Active Problems:    Liver transplant recipient (CMS-HCC)    Morbid obesity with BMI of 40.0-44.9, adult (CMS-HCC)    Acute hypoxemic respiratory failure due to severe acute respiratory syndrome coronavirus 2 (SARS-CoV-2) disease (CMS-HCC)  Resolved Problems:    * No resolved hospital problems. *    HPI: Evelyn Rollins is a 54 y.o. female with PMH cirrhosis status post liver transplant 2016 on chronic immunosuppressive therapy, hypertension, diabetes, hypothyroidism who presented to [facility] on 7/22 with myalgias, sore throat, loss of taste and fevers  x5 days. When her symptoms had started, she took a home Covid test on 7/17 which was found to be positive. She has had poor p.o. intake for the last several days. Her husband also has similar symptoms. In the OSH ED she was found to be severely hypoxic with O2 saturations of 50% on room air. She was placed on a nonrebreather and current saturations increased to the low 90s. Chest x-ray shows bilateral infiltrates consistent with COVID-19 pneumonia. Her Covid test is positive. Inflammatory markers are elevated. She received a dose of Decadron in the emergency room.      Neurological   Analgesia and Sedation  -dilaudid and versed gtt  -PRN dilaudid and versed  -ativan 2q4h, oxy 20q4h    Anxiety, depression  -holding home meds:    Seizure history     Pulmonary   Hypoxic respiratory failure 2/2 COVID ARDS  -continue low tidal volume ventilation  -ABG q4h  -prone if P:F <100    Cardiovascular   HTN Hx  -holding home meds    Renal   AKI  -fe urea  -pending   -baseline Scr .8-1.1    Infectious Disease/Autoimmune   #COVID 19 infection  Symptom onset: 7/17  Exposure: unknown  COVID PCR+: 7/17  OSH Admission: 7/22  Day of admission/transfer: 7/22  OSH Meds Given: dexamethasone  COVID Specific Meds: dexamethasone  Vaccination status: not vaccinated    Monitoring:  - Special airborne/contact precautions (If unavailable, droplet & contact precautions)  - f/u daily CMP, CBC w/ diff, CRP, D-dimer, DIC, troponin, pro-BNP with weekly ferritin, LDH and cytokine level  - f/u q8h lactate; ABG  - f/u CXR    Medications:  - remdesivir started 7/22, hold if (1) ALT >=5x ULN or (2) s/s liver inflammation or (3) elevated ALT w inc indirect Bili, alkphos or INR  - cont decadron 6mg  IV daily x 10d, started 7/22    #CAP/VAP  - afebrile, leukocytosis   - no abx indicated   - cont abx regimen   - pan cx for T>38.2   - f/u cx data       Cultures:  No results found for: BLOOD CULTURE, URINE CULTURE, LOWER RESPIRATORY CULTURE  WBC (10*9/L)   Date Value   07/03/2020 5.8     WBC, UA (/HPF)   Date Value   07-11-20 40 (H)        FEN/GI   -trickle tube feeds  -bowel regimen  -PPI    Malnutrition Assessment: Not done yet.     #Immunocompromised s/p Liver transplant in 2016  -continue tacrolimus  -level pending    Heme/Coag   Hypercoagulable state 2/2 covid  -anticoagulation group C  -lovenox  -LE dopplers pendig    Endocrine   History of DM2  -ISS    Integumentary   #  -  WOCN consulted for high risk skin assessment Yes.  - WOCN recs >> pending - cont pressure mitigating precautions per skin policy    Prophylaxis/LDA/Restraints/Consults   Can CVC be removed? No: need for medications requiring central access (e.g. pressors)   Can A-line be removed? No: frequent ABGs  Can Foley be removed? No: Need continuous I/O  Mobility plan: Step 2 - Head of bed elevation (>60 degrees)    Feeding: Trickle feeds, advance as tolerated  Analgesia: No pain issues  Sedation SAT/SBT: No FiO2 > .50  Thromboembolic ppx: Enoxaparin  Head of bed >30 degrees: Yes  Ulcer ppx: Yes, coagulopathy  Glucose within target range: Yes, in range    Does patient need/have an active type/screen? Yes    RASS at goal? No - adjusting sedation or order to reflect need  [place] Agitation Assessment Scale (RASS) : -2 (07/03/2020  4:06 AM)     Can antipsychotics be stopped? No: Ongoing ICU delirium. Need to readdress daily to assess for discontinuation.       Would hospice care be appropriate for this patient? Yes, but family not ready    Patient Lines/Drains/Airways Status    Active Active Lines, Drains, & Airways     Name:   Placement date:   Placement time:   Site:   Days:    ETT  7.5   07/09/2020    1700     less than 1    CVC Triple Lumen July 09, 2020 Non-tunneled Right Internal jugular   07/09/2020    1945    Internal jugular   less than 1    Urethral Catheter   07/09/2020    ???    ???   1    Peripheral IV 07-09-2020 Left Antecubital   07/09/2020    1300    Antecubital   less than 1    Arterial Line 2020/07/09 Right Radial   07-09-2020    2315    Radial   less than 1              Patient Lines/Drains/Airways Status    Active Wounds     Name:   Placement date:   Placement time:   Site:   Days:    Surgical Site 06/24/15 Other (Comment)   06/24/15    1522     1835                Goals of Care     Code Status: Full Code    Designated Healthcare Decision Maker:  Ms. Hugill current decisional capacity for healthcare decision-making is incapacitated. Her designated Educational psychologist) is/are .      Subjective     Intubated, sedated lady     ROS: Ten-point review of systems is obtained and is negative except as noted in HPI.    Allergies  No Known Allergies    Meds  No current facility-administered medications on file prior to encounter.     Current Outpatient Medications on File Prior to Encounter   Medication Sig   ??? acetaminophen (TYLENOL) 325 MG tablet Take 650 mg by mouth daily as needed.   ??? amLODIPine (NORVASC) 5 MG tablet Take 5 mg by mouth daily.   ??? ARIPiprazole (ABILIFY) 5 MG tablet Take 5 mg by mouth nightly.    ??? aspirin (ECOTRIN) 81 MG tablet Take 81 mg by mouth daily.   ??? buPROPion (WELLBUTRIN XL) 300 MG 24 hr tablet Take 300 mg by mouth daily. PCP managing   ???  calcium carbonate/vitamin D3 (CALCIUM WITH VITAMIN D ORAL) Take 600 mg by mouth every other day. Calcium 600mg  and vitamin D 200units   ??? escitalopram oxalate (LEXAPRO) 20 MG tablet Take 20 mg by mouth daily. PCP managing this   ??? fluticasone propionate (FLONASE) 50 mcg/actuation nasal spray 2 sprays into each nostril.   ??? levothyroxine (SYNTHROID) 100 MCG tablet 100 mcg.    ??? liothyronine (CYTOMEL) 5 MCG tablet TAKE 1 TABLET ON AN EMPTY STOMACH X 7 DAYS, THEN INCREASE TO 2 TABS IF 1 TAB TOLERATED   ??? liraglutide (VICTOZA) injection pen Inject 0.1 mL (0.6 mg total) under the skin daily.   ??? LORazepam (ATIVAN) 0.5 MG tablet Take 1 mg by mouth two (2) times a day as needed for anxiety.    ??? magnesium oxide (MAG-OX) 400 mg tablet Take 400 mg by mouth.   ??? magnesium oxide 150 mg magnesium Tab tablet Take by mouth. (Patient not taking: Reported on 04/21/2020)   ??? mycophenolate (MYFORTIC) 180 MG EC tablet Take 2 tablets (360mg ) by mouth twice daily.   ??? nystatin, bulk, 15 billion unit Powd 1 application by Miscellaneous route once as needed.   ??? pen needle, diabetic 29 gauge x 1/2 (12 mm) Ndle Use as directed once daily   ??? pravastatin (PRAVACHOL) 40 MG tablet Take 40 mg by mouth daily. PCP started and managing   ??? progesterone (PROMETRIUM) 100 MG capsule Take 100 mg by mouth.   ??? tacrolimus (PROGRAF) 1 MG capsule Take 2 capsules (2mg ) by mouth in the morning and 1 capsule (1mg ) in the evening.   ??? zolpidem (AMBIEN) 5 MG tablet Take 1 tab by mouth at bedtime       Past Medical History  Past Medical History:   Diagnosis Date   ??? Cirrhosis (CMS-HCC)    ??? Hypothyroidism    ??? NAFLD (nonalcoholic fatty liver disease)    ??? Obesity    ??? Seizure (CMS-HCC) Dec 2015       Past Surgical History  Past Surgical History:   Procedure Laterality Date   ??? CESAREAN SECTION  1987, 1989    2   ??? ESOPHAGOGASTRODUODENOSCOPY  recently   ??? HYSTERECTOMY  2012   ??? PR ERCP BALLOON DILATE BILIARY/PANC DUCT/AMPULLA EA N/A 09/02/2015    Procedure: ERCP;WITH TRANS-ENDOSCOPIC BALLOON DILATION OF BILIARY/PANCREATIC DUCT(S) OR OF AMPULLA, INCLUDING SPHINCTERECTOMY, WHEN PERFOREMD,EACH DUCT (16109);  Surgeon: Malcolm Metro, MD;  [facility];  Service: Gastroenterology   ??? PR ERCP REMOVE FOREIGN BODY/STENT BILIARY/PANC DUCT N/A 09/02/2015    Procedure: ENDOSCOPIC RETROGRADE CHOLANGIOPANCREATOGRAPHY (ERCP); W/ REMOVAL OF FOREIGN BODY/STENT FROM BILIARY/PANCREATIC DUCT(S);  Surgeon: Malcolm Metro, MD;  Location: [facility];  Service: Gastroenterology   ??? PR ERCP STENT PLACEMENT BILIARY/PANCREATIC DUCT N/A 07/07/2015    Procedure: ENDOSCOPIC RETROGRADE CHOLANGIOPANCREATOGRAPHY (ERCP); WITH PLACEMENT OF ENDOSCOPIC STENT INTO BILIARY OR PANCREATIC DUCT;  Surgeon: Malcolm Metro, MD;  [facility];  Service: Gastroenterology   ??? PR ERCP,W/REMOVAL STONE,BIL/PANCR DUCTS N/A 09/02/2015    Procedure: ERCP; W/ENDOSCOPIC RETROGRADE REMOVAL OF CALCULUS/CALCULI FROM BILIARY &/OR PANCREATIC DUCTS;  Surgeon: Malcolm Metro, MD;  [facility];  Service: Gastroenterology   ??? PR TRANSPLANT LIVER,ALLOTRANSPLANT N/A 06/24/2015    Procedure: LIVER ALLOTRANSPLANTATION; ORTHOTOPIC, PARTIAL OR WHOLE, FROM CADAVER OR LIVING DONOR, ANY AGE;  Surgeon: Vivi Barrack, MD;  Location: MAIN OR [facility];  Service: Transplant   ??? PR TRANSPLANT,PREP DONOR LIVER, WHOLE N/A 06/24/2015  Procedure: BACKBNCH STD PREP CAD DONOR WHOLE LIVER GFT PRIOR TNSPLNT,INC CHOLE,DISS/REM SURR TISSU WO TRISEG/LOBE SPLT;  Surgeon: Vivi Barrack, MD;  Location: MAIN OR [facility];  Service: Transplant       Family History  Family History   Problem Relation Age of Onset   ??? Breast cancer Mother    ??? Thyroid cancer Mother    ??? Ovarian cancer Mother    ??? Cancer Mother    ??? COPD Father    ??? Diabetes Father    ??? Melanoma Father    ??? Other Father         pacemaker       Social History  Social History     Socioeconomic History   ??? Marital status: Married     Spouse name: Not on file   ??? Number of children: Not on file   ??? Years of education: Not on file   ??? Highest education level: Not on file   Occupational History   ??? Not on file   Tobacco Use   ??? Smoking status: Never Smoker   ??? Smokeless tobacco: Never Used   Substance and Sexual Activity   ??? Alcohol use: No     Alcohol/week: 0.0 standard drinks   ??? Drug use: No   ??? Sexual activity: Not on file   Other Topics Concern   ??? Not on file   Social History Narrative    Living situation: the patient lives with her husband.    Address [place], [place], [place]): [place], [place], [place]    Guardian/Payee: None        Family Contact: none provided    Outpatient Providers: no psychiatric providers    Relationship Status: Married     Children: Yes; two children and one stepchild    Education: HS graduate    Income/Employment/Disability: Doesn't currently work. 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 Informant: the patient     Domestic Violence: None known. Informant: the patient     Exposure/Witness to Violence: None known    Protective Services Involvement: None known    Current/Prior Legal: None known    Physical Aggression/Violence: None known    Access to Firearms: Yes, Unsecured     Gang Involvement: None known        Psychiatric History:    Diagnoses:    None        Inpatient hospitalizations:    None        Outpatient treatment:    None        Medication trials:    None per patient. Per medical chart, she received Valium for a brief period of time for some anxiety.        Suicide attempts/Self-injury:    None        Substance Use:    None        Family Psychiatric History:    None                 Social Determinants of Health     Financial Resource Strain:    ??? Difficulty of Paying Living Expenses:    Food Insecurity:    ??? Worried About Programme researcher, broadcasting/film/video in the Last Year:    ??? Ran Out of Food in the Last Year:    Transportation Needs:    ??? Lack of Transportation (Medical):    ??? Lack  of Transportation (Non-Medical):    Physical Activity:    ??? Days of Exercise per Week:    ??? Minutes of Exercise per Session:    Stress:    ??? Feeling of Stress :    Social Connections:    ??? Frequency of Communication with Friends and Family:    ??? Frequency of Social Gatherings with Friends and Family:    ??? Attends Religious Services:    ??? Database administrator or Organizations:    ??? Attends Banker Meetings:    ??? Marital Status:            Objective     Vitals - past 24 hours  Temp:  [36.9 ??C (98.4 ??F)-37.6 ??C (99.7 ??F)] 36.9 ??C (98.4 ??F)  Heart Rate:  [59-88] 60  SpO2 Pulse:  [59-79] 59  Resp:  [16-58] 58  BP: (110-164)/(54-78) 110/54  FiO2 (%):  [70 %-80 %] 75 %  SpO2:  [93 %-98 %] 95 % Intake/Output  I/O last 3 completed shifts:  In: 82.8 [I.V.:82.8]  Out: 300 [Urine:300]     Physical Exam:   General: NAD, well nourished, obese, intubated, sedated   HEENT:  normocephalic, trachea mdl, anicteric, no scleral edema, no conjuctival erythema/drainage, MMM and pink   CV: RRR. S1 and S2 normal, no m/r/c/g. no bruit, or JVD. DP Pulses 2  equal. No BLE edema.  Lungs:   chest rise symm, diminished bases. CTA bilat, no wheezes/crackles/rhonchi. Good air movement.  Skin:   w/d/i, no jaundice present, no rashes, lesions, petechiae or breakdown. no drng, erythema.    Abd: contour, abdomen soft, non-tender and not distended. Normoactive bowel sounds x 4 quads, no rebound tenderness or guarding.   Ext: No cyanosis, bruising, clubbing or edema.       Continuous Infusions:   ??? HYDROmorphone 10 mg/hr (07/03/20 0600)   ??? midazolam (1 mg/mL) infusion 4 mg/hr (07/03/20 0600)       Scheduled Medications:   ??? chlorhexidine  5 mL Mouth BID   ??? dexamethasone  6 mg Intravenous Q24H   ??? enoxaparin (LOVENOX) injection  40 mg Subcutaneous Q12H   ??? insulin lispro  0-12 Units Subcutaneous ACHS   ??? remdesivir  100 mg Intravenous Daily       PRN medications:  dextrose 50 % in water (D50W)    Data/Imaging Review: Reviewed in Epic and personally interpreted on 07/03/2020. See EMR for detailed results.    I personally provided critical care services to assess, manipulate, and/ or support vital system functions(s) to treat single or multiple vital organ system failure and/or to prevent further life threatening deterioration of the patient???s condition of covid-19. This included 45 minutes that I personally spent performing critical care services, excluding procedures.  Lanae Crumbly, NP

## 2020-07-03 NOTE — Unmapped (Signed)
Adult Nutrition Assessment Note    Visit Type: MD Consult  Reason for Visit: Enteral Nutrition    This patient was not seen in person. The clinical nutrition service has moved to a liaison model to minimize potential spread of COVID-19 and protect patients/providers.  During this time, we will be limiting person-to-person contact when possible.    HPI & PMH:  Evelyn Rollins is a 54 y.o. female with PMH cirrhosis status post liver transplant 2016 on chronic immunosuppressive therapy, hypertension, diabetes, hypothyroidism who presented to [facility] on 7/22 with myalgias, sore throat, loss of taste and fevers  x5 days. When her symptoms had started, she took a home Covid test on 7/17 which was found to be positive. She has had poor p.o. intake for the last several days. Her husband also has similar symptoms. In the OSH ED she was found to be severely hypoxic with O2 saturations of 50% on room air. She was placed on a nonrebreather and current saturations increased to the low 90s. Chest x-ray shows bilateral infiltrates consistent with COVID-19 pneumonia. Her Covid test is positive. Inflammatory markers are elevated. She received a dose of Decadron in the emergency room.    Anthropometric Data:  -- Height: 157.5 cm (5' 2.01)   -- Last recorded weight: (!) 104.8 kg (231 lb)  -- Admission weight: (!) 104.8 kg (231 lb)  -- IBW: 49.99 kg  -- Percent IBW: 209.6 %  -- BMI: Body mass index is 42.24 kg/m??.   -- Weight changes this admission:   Last 5 Recorded Weights    25-Jul-2020 1808   Weight: (!) 104.8 kg (231 lb)      -- Weight history:   Wt Readings from Last 10 Encounters:   July 25, 2020 (!) 104.8 kg (231 lb)   06/25/18 (!) 104.8 kg (231 lb 1.6 oz)   04/26/18 (!) 105 kg (231 lb 6.4 oz)   03/21/18 (!) 103 kg (227 lb)   09/21/17 (!) 100.9 kg (222 lb 6.4 oz)   09/04/17 99.6 kg (219 lb 8 oz)   07/03/17 (!) 103 kg (227 lb)   07/03/17 (!) 102.6 kg (226 lb 1.6 oz)   05/31/17 (!) 104.6 kg (230 lb 9.6 oz)   05/17/17 (!) 106.6 kg (235 lb 1.6 oz)        Nutrition Focused Physical Exam:  Unable to complete at this time due to tele-visit      NUTRITIONALLY RELEVANT DATA     Medications:   Nutritionally pertinent medications reviewed and evaluated for potential food and/or medication interactions.     Labs:   Lab Results   Component Value Date    NA 142 07/03/2020    K 4.0 07/03/2020    CL 112 (H) 07/03/2020    CO2 23.0 07/03/2020    BUN 24 (H) 07/03/2020    CREATININE 1.33 (H) 07/03/2020    GLU 188 (H) 07/03/2020    CALCIUM 8.0 (L) 07/03/2020    ALBUMIN 2.5 (L) 07/03/2020    PHOS 3.1 07/03/2020         Food and Nutrition History:   Prior to admission: Patient unable to provide at this time.  Currently intubated/sedated in MICU.  OGT in place for enteral feeds with imaging study today suggesting - Enteric tube tip terminates over the right midabdomen, likely within the proximal duodenum. Recommend retracting 5-10 cm for gastric placement.     Current Nutrition:  Enteral nutrition via OG tube       Nutrition Orders   (  From admission, onward)             Start     Ordered    06/14/2020 1757  Adult Enteral Nutrition Vital High Protein (Critical Care Low Cal/HP)  Effective now     Question Answer Comment   Enteral Nutrition Formula: Vital High Protein (Critical Care Low Cal/HP)    Feeding Route: OG Tube    Enteral Nutrition Continuous initial rate (mL/hr): 10        06/17/2020 1800                   Nutrient Needs:   Daily Estimated Nutrient Needs:  Energy: 1478-2956 kcals 11-14 kcal/kg (per ASPEN/SCCM guidelines for the critically ill obese, BMI 30-50) using admission body weight, 104.8 kg (07/03/20 1335)]  Protein: 124 gm [> 2.5 gm/kg (per ASPEN/SCCM guidelines for the critically ill obese, BMI >/equal to 40) using ideal body weight, 49.99 kg (07/03/20 1335)]  Carbohydrate:   [45-60% of kcal]  Fluid:   mL [per MD team]      Malnutrition assessment not yet completed at this time due lack of nutrition history and inability to complete nutrition focused physical exam (NFPE).     GOALS and EVALUATION     ??? Patient to meet 75% or greater of nutrient needs via enteral nutrition within 3 days. - New    Assessment of motivation, barriers, or compliance:  None identified at this time.    NUTRITION ASSESSMENT     ??? Patient appropriate for enteral nutrition support to meet nutrient needs given respiratory failure requiring mechanical ventilation.     Discharge Planning:   Monitor for potential discharge needs with multi-disciplinary team.       NUTRITION INTERVENTIONS and RECOMMENDATION     For enteral feeds, recommend Vital HP, advanced as tolerates to goal of 70 ml/hr   - Provides: 1470 kcal, 129 gm pro, 164 gm CHO and 1234 ml water   - FWF per MD and hydration needs  Replete lytes PRN  Please weigh pt weekly    Follow-Up Parameters:   1-2 times per week (and more frequent as indicated)    Linde Gillis, RD, LDN  [phone]

## 2020-07-03 NOTE — Unmapped (Signed)
MICU Daily Progress Note     Date of Service: 07/03/2020    Problem List:   Active Problems:    Liver transplant recipient (CMS-HCC)    Morbid obesity with BMI of 40.0-44.9, adult (CMS-HCC)    Acute hypoxemic respiratory failure due to severe acute respiratory syndrome coronavirus 2 (SARS-CoV-2) disease (CMS-HCC)  Resolved Problems:    * No resolved hospital problems. *      Interval history: : Evelyn Rollins is a 54 y.o. female with PMH cirrhosis status post liver transplant 2016 on chronic immunosuppressive therapy, hypertension, diabetes, hypothyroidism who presented to [facility] on 7/22 with myalgias, sore throat, loss of taste and fevers  x5 days. When her symptoms had started, she took a home Covid test on 7/17 which was found to be positive. She has had poor p.o. intake for the last several days. Her husband also has similar symptoms. In the OSH ED she was found to be severely hypoxic with O2 saturations of 50% on room air. She was placed on a nonrebreather and current saturations increased to the low 90s. Chest x-ray shows bilateral infiltrates consistent with COVID-19 pneumonia. Her Covid test is positive. Inflammatory markers are elevated. She received a dose of Decadron in the emergency room.    24hr events:   -no events overnight remains on PRVC 75%    Neurological   Analgesia and Sedation  -dilaudid and versed gtt  -PRN dilaudid and versed  -ativan 2q4h, oxy 20q4h  ??  Anxiety, depression  -holding home meds: abilify, wellbutrin, lexapro  ??  ?Seizure history  -husband reports that she has not had a seizure in over 5 years and there was question whether she actually ever had one  -not currently on ppx     Pulmonary   Hypoxic respiratory failure 2/2 COVID ARDS  -continue low tidal volume ventilation  -ABG q4h  -prone if P:F <100    Cardiovascular   HTN Hx  -holding home amlodipine  -continue home aspirin    Renal   AKI  -fe urea pending   -baseline Scr .8-1.1    Infectious Disease/Autoimmune   #COVID 19 infection  Symptom onset:??7/17  Exposure: unknown  COVID PCR+: 7/17  OSH Admission:??7/22  Day of admission/transfer: 7/22  OSH Meds Given:??dexamethasone  COVID Specific??Meds:??dexamethasone, remdesivir  Vaccination status: not vaccinated  ??  Monitoring:  - Special airborne/contact precautions (If unavailable, droplet & contact precautions)  - f/u daily CMP, CBC w/ diff, CRP, D-dimer, DIC, troponin, pro-BNP with weekly ferritin, LDH and cytokine level  - f/u q8h lactate; ABG  - f/u CXR  ??  Medications:  - remdesivir started 7/22, hold if (1) ALT >=5x ULN or (2) s/s liver inflammation or (3) elevated ALT w inc indirect Bili, alkphos or INR  - cont decadron 6mg  IV daily x 10d, started 7/22  ??  #CAP/VAP  - afebrile, leukocytosis   - no abx indicated   - pan cx for T>38.2   - f/u cx data   ??    Cultures:  No results found for: BLOOD CULTURE, URINE CULTURE, LOWER RESPIRATORY CULTURE  WBC (10*9/L)   Date Value   07/03/2020 5.8     WBC, UA (/HPF)   Date Value   07/08/2020 40 (H)        FEN/GI   -trickle tube feeds  -bowel regimen  -PPI  ??    Malnutrition Assessment: Not done yet.     ??  Malnutrition Assessment: Not done yet.  #Immunocompromised  s/p Liver transplant in 2016  -continue tacrolimus  -level pending  -holding cellcept  -ICID c/s appreciate recs    Heme/Coag   Hypercoagulable state 2/2 covid  -anticoagulation group C lovenox   -LE dopplers pending    Endocrine   Hypothyroidism, history of DM2  -continue ISS  -continue synthroid      Integumentary     #  - WOCN consulted for high risk skin assessment Yes.  - WOCN recs >> pending   - cont pressure mitigating precautions per skin policy    Prophylaxis/LDA/Restraints/Consults   Can CVC be removed? No: need for medications requiring central access (e.g. pressors)   Can A-line be removed? No: frequent ABGs  Can Foley be removed? No: Need continuous I/O  Mobility plan: Step 1 - Range of motion    Feeding: Trickle feeds, advance as tolerated  Analgesia: No pain issues Sedation SAT/SBT: No FiO2 > .50  Thromboembolic ppx: Enoxaparin  Head of bed >30 degrees: Yes  Ulcer ppx: Yes, mechanical ventilation > 3 days, not on tube feeds  Glucose within target range: Yes, in range    Does patient need/have an active type/screen? Yes    RASS at goal? Yes  [place] Agitation Assessment Scale (RASS) : -4 (07/03/2020  9:03 AM)     Can antipsychotics be stopped? N/A, not on antipsychotics  CAM-ICU Result: Positive (07/03/2020  8:00 AM)      Would hospice care be appropriate for this patient? No, patient improving or expected to improve    Patient Lines/Drains/Airways Status    Active Active Lines, Drains, & Airways     Name:   Placement date:   Placement time:   Site:   Days:    ETT  7.5   06/16/2020    1700     less than 1    CVC Triple Lumen 06/29/2020 Non-tunneled Right Internal jugular   07/09/2020    1945    Internal jugular   less than 1    NG/OG Tube Decompression;Feedings Center mouth   07/03/20    0610    Center mouth   less than 1    Urethral Catheter   06/22/2020    ???    ???   1    Peripheral IV 07/07/2020 Left Antecubital   06/11/2020    1300    Antecubital   less than 1    Arterial Line 06/25/2020 Right Radial   07/05/2020    2315    Radial   less than 1              Patient Lines/Drains/Airways Status    Active Wounds     None                Goals of Care     Code Status: Full Code    Designated Healthcare Decision Maker:  Ms. Ahlquist current decisional capacity for healthcare decision-making is Full capacity. Her designated Educational psychologist) is/are husband .      Subjective     Middle aged woman lying in bed, intubated and sedated    Objective     Vitals - past 24 hours  Temp:  [36.9 ??C (98.4 ??F)-37.6 ??C (99.7 ??F)] 37.2 ??C (99 ??F)  Heart Rate:  [59-88] 59  SpO2 Pulse:  [59-79] 59  Resp:  [16-32] 28  BP: (110-164)/(54-78) 125/65  FiO2 (%):  [65 %-80 %] 65 %  SpO2:  [77 %-99 %] 96 % Intake/Output  I/O last  3 completed shifts:  In: 509.8 [I.V.:96.8; IV Piggyback:413]  Out: 350 [Urine:350] ??  Physical Exam:   General: NAD, well nourished, obese, intubated, sedated   HEENT:  normocephalic, trachea mdl, anicteric, no scleral edema, no conjuctival erythema/drainage, MMM and pink   CV: RRR. S1 and S2 normal, no m/r/c/g. no bruit, or JVD. DP Pulses 2  equal. No BLE edema.  Lungs:   chest rise symm, diminished bases. CTA bilat, no wheezes/crackles/rhonchi. Good air movement.  Skin:   w/d/i, no jaundice present, no rashes, lesions, petechiae or breakdown. no drng, erythema.    Abd: contour, abdomen soft, non-tender and not distended. Normoactive bowel sounds x 4 quads, no rebound tenderness or guarding.   Ext: No cyanosis, bruising, clubbing or edema.   ??      Continuous Infusions:   ??? HYDROmorphone 3 mg/hr (07/03/20 1000)   ??? midazolam (1 mg/mL) infusion 3 mg/hr (07/03/20 1000)       Scheduled Medications:   ??? amLODIPine  5 mg Oral Daily   ??? aspirin  81 mg Oral Daily   ??? chlorhexidine  5 mL Mouth BID   ??? dexamethasone  6 mg Intravenous Q24H   ??? enoxaparin (LOVENOX) injection  40 mg Subcutaneous Q12H   ??? insulin lispro  0-12 Units Subcutaneous ACHS   ??? levothyroxine  100 mcg Enteral tube: gastric  daily   ??? LORazepam  2 mg Oral Q4H   ??? oxyCODONE  20 mg Oral Q4H   ??? polyethylen glycol  17 g Oral TID   ??? pravastatin  40 mg Oral Daily   ??? remdesivir  100 mg Intravenous Daily   ??? senna  2 tablet Oral BID       PRN medications:  dextrose 50 % in water (D50W)    Data/Imaging Review: Reviewed in Epic and personally interpreted on 07/03/2020. See EMR for detailed results.    I personally provided critical care services to assess, manipulate, and/ or support vital system functions(s) to treat single or multiple vital organ system failure and/or to prevent further life threatening deterioration of the patient???s condition of covid-19 ARDS. This included 45 minutes that I personally spent performing critical care services, excluding procedures.  Lanae Crumbly, NP

## 2020-07-03 NOTE — Unmapped (Signed)
Arterial Line Insertion Procedure Note     Date of Service: 2020/07/30    Patient Name:: Evelyn Rollins  Patient MRN: [id number]    Indications: Hemodynamic monitoring    Procedure Details:   Informed consent was obtained after explanation of the risks and benefits of the procedure, refer to the consent documentation.    Time-out was performed immediately prior to the procedure to verify correct patient, procedure, site, positioning, and special equipment if applicable.    Freida Busman???s test was performed to ensure adequate perfusion. The patient???s right wrist was prepped and draped in sterile fashion. 1% Lidocaine was used to anesthetize the area. A 20 G Arrow line was introduced into the radial artery with ultrasound guidance. The catheter was threaded over the guide wire and the needle was removed with appropriate pulsatile blood return. The catheter was then sutured in place to the skin and a sterile CHG dressing applied. Perfusion to the extremity distal to the point of catheter insertion was checked and found to be adequate.  A pressure transducer was connected sterilely to the arterial line and an arterial line waveform was noted on the monitor.     Estimated Blood Loss: 1 ml    Condition:  The patient tolerated the procedure well and remains in the same condition as pre-procedure.    Complications:  The patient tolerated the procedure well and there were no complications.    Plan:  Will obtain ABG.     Esther Hardy, MD

## 2020-07-03 NOTE — Unmapped (Signed)
MICU Nightshift Note     Date of Service: 07/03/2020    Active Problems:    Liver transplant recipient (CMS-HCC)    Morbid obesity with BMI of 40.0-44.9, adult (CMS-HCC)    Acute hypoxemic respiratory failure due to severe acute respiratory syndrome coronavirus 2 (SARS-CoV-2) disease (CMS-HCC)  Resolved Problems:    * No resolved hospital problems. *          Plan summary     Arterial line placed, see procedure note. Tolerated well. ABG allowed for weaning of FiO2 and adjustment of Tv to 6mg /kg ( ). Repeat gas with pH 7.29. Co2 45 and O2 71 on 75% FiO2, will increase rate to 28-30 and increase PEEP to 16 and repeat gas in one hour. If p/f ratio > 150, will need to consider proning.       Esther Hardy, MD

## 2020-07-03 NOTE — Unmapped (Signed)
CVAD Liaison - Procedure In Progress Upon Arrival    The CVAD Liaison was contacted for the insertion of Central Venous Access Device (CVAD).  The procedure had already started prior to arrival. CVAD Liaison remained until the entire of the procedure as a resource. CVAD was inserted by Davene Costain, NP.Report of the procedure given to the Primary Nurse.Line kit open, room and  sterile field setup upon arrival to bedside, but line was not started yet.    Thank you for this consult,  Thalia Bloodgood RN    Consult Time 30 minutes (min)

## 2020-07-03 NOTE — Unmapped (Addendum)
NEW IMMUNOCOMPROMISED HOST INFECTIOUS DISEASE CONSULT NOTE      Evelyn Rollins is being seen in consultation at the request of [name], * for evaluation of COVID-19 in transplant patient.    Assessment/Recommendations:    Evelyn Rollins is a 54 y.o. female with PMHx including NAFLD s/p OLT in 2016, DM2 not on insulin (unknown A1c), seizures x1 in remote past and morbid obesity who presented to an OSH on 7/22 with 7 days of symptoms and profound hypoxia due to critical COVID-19 ARDS requiring intubation on 7/22. Started on dex and remdesivir. No evidence of concomitant secondary bacterial or fungal infection.    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   - Surgical complications: biliary anastomosis leak with pelvic collection, s/p abx  - Serologies: CMV D-/R+, EBV D+/R+, Toxo D?/R-  - Induction: basiliximab  - Immunosuppression: myfortic, tac  - Prophylaxis: none    Pertinent Exposure History  Donor Infections: Donor death from drowning     Pertinent Co-morbidities  # Morbid obesity  # Hypothyroidism  # DM - unknown A1c  # Depression/anxiety  # Seizure x1 in remote past    Drug Intolerances - none     Infection History  # Critical SARS CoV-2 infection with ARDS and w/o secondary bacterial or fungal infection requiring intubation on day of presentation (7/22) in unvaccinated patient, onset 06/24/20   -Known exposure(s): daughter, son-in-law on 7/10  -Date of symptom onset : 7/14   -Date of diagnostic test: 7/17 (home test), 7/22 at OSH   -Remdesivir administered: 7/22-    -Steroids administered: dexamethasone 7/22-  -CCP 1U or 2U administered: n/a  -Monoclonal antibody administered: n/a  -Tocilizumab administered: n/a  -Procal at OSH on 7/22 0.28 (low)       RECOMMENDATIONS FOR 07/03/2020    Diagnostic  ??? Add on A1c   ??? Agree with Neurology evaluation     Treatment  ??? Agree dexamethasone 6mg  x10 days   ??? Agree with remdesivir x5 days          The [facility] ID service will continue to follow from afar.  Please page the ID Transplant/Liquid Oncology Fellow consult at [phone] with questions.  Patient discussed with Dr. Mila Homer.    Maryjean Morn, MD  Infectious Disease Fellow, PGY-5    History of Present Illness:      Source of information includes:  Electronic Medical Records.  History obtained from:chart and family member.    Patient is a 54yo female with PMHx including NAFLD s/p OLT in 2016, DM2 not on insulin (unknown A1c), seizures x1 in remote past and morbid obesity who presented to an OSH on 7/22 with 7 days of fever, cough, headache and loss of sense of taste and smell with 2 days of worsening SOB. She went out to eat with her daughter and son-in-law on 7/10 then the next day they both tested positive for Covid. The patient started feeling ill on 7/14. She took a home covid test on 7/17 when her symptoms worsened that was positive. She then presented to the OSH ED after she had been SOB x2 days. Per OSH notes, no n/v/d. Did have decreased PO.     In the ED, she was tachypneic in the 30-40s and on presentation was satting 50% on RA. She was placed on NRB. CXR with bilateral dense patchy infiltrates and COVID test was positive. She got a dose of dex in the ED. Of note, at OSH ED  her procal was 0.28 (neg). She was intubated then transferred to [facility] for further care. She was continued on dex and started on remdesivir.     Allergies:  No Known Allergies    Medications:   Antimicrobials:  Anti-infectives (From admission, onward)    Start     Dose/Rate Route Frequency Ordered Stop    July 16, 2020 1900  remdesivir (VEKLURY) 100 mg in sodium chloride (NS) 0.9 % 295 mL IVPB     Question Answer Comment   Has the patient/caregiver been given copy of EUA fact sheet? Yes    Has the patient/caregiver been counseled on alternative therapies? Yes    Has the patient/caregiver been counseled that this medication is an unapproved drug? Yes    Lab confirmed SARS-CoV-2 infection? Yes    Does patient have symptoms consistent with SARS-CoV-2 infection? Yes    Hospitalization less than or equal to 10 days for COVID-19? Yes        100 mg  590 mL/hr over 30 Minutes Intravenous Daily (standard) 07/16/20 1823 07/06/20 0859          Current/Prior immunomodulators:  Dexamethasone 6mg  7/22-   Myfortic  Tac    Other medications reviewed.     Medical History:  Past Medical History:   Diagnosis Date   ??? Cirrhosis (CMS-HCC)    ??? Hypothyroidism    ??? NAFLD (nonalcoholic fatty liver disease)    ??? Obesity    ??? Seizure (CMS-HCC) Dec 2015       Surgical History:  Past Surgical History:   Procedure Laterality Date   ??? CESAREAN SECTION  1987, 1989    2   ??? ESOPHAGOGASTRODUODENOSCOPY  recently   ??? HYSTERECTOMY  2012   ??? PR ERCP BALLOON DILATE BILIARY/PANC DUCT/AMPULLA EA N/A 09/02/2015    Procedure: ERCP;WITH TRANS-ENDOSCOPIC BALLOON DILATION OF BILIARY/PANCREATIC DUCT(S) OR OF AMPULLA, INCLUDING SPHINCTERECTOMY, WHEN PERFOREMD,EACH DUCT (16109);  Surgeon: Malcolm Metro, MD;  Location: [facility];  Service: Gastroenterology   ??? PR ERCP REMOVE FOREIGN BODY/STENT BILIARY/PANC DUCT N/A 09/02/2015    Procedure: ENDOSCOPIC RETROGRADE CHOLANGIOPANCREATOGRAPHY (ERCP); W/ REMOVAL OF FOREIGN BODY/STENT FROM BILIARY/PANCREATIC DUCT(S);  Surgeon: Malcolm Metro, MD;  Location: [facility];  Service: Gastroenterology   ??? PR ERCP STENT PLACEMENT BILIARY/PANCREATIC DUCT N/A 07/07/2015    Procedure: ENDOSCOPIC RETROGRADE CHOLANGIOPANCREATOGRAPHY (ERCP); WITH PLACEMENT OF ENDOSCOPIC STENT INTO BILIARY OR PANCREATIC DUCT;  Surgeon: Malcolm Metro, MD;  Location: GI [facility]URES [facility];  Service: Gastroenterology   ??? PR ERCP,W/REMOVAL STONE,BIL/PANCR DUCTS N/A 09/02/2015    Procedure: ERCP; W/ENDOSCOPIC RETROGRADE REMOVAL OF CALCULUS/CALCULI FROM BILIARY &/OR PANCREATIC DUCTS;  Surgeon: Malcolm Metro, MD;  Location: GI [facility]URES [facility];  Service: Gastroenterology   ??? PR TRANSPLANT LIVER,ALLOTRANSPLANT N/A 06/24/2015    Procedure: LIVER ALLOTRANSPLANTATION; ORTHOTOPIC, PARTIAL OR WHOLE, FROM CADAVER OR LIVING DONOR, ANY AGE;  Surgeon: Vivi Barrack, MD;  Location: MAIN OR [facility];  Service: Transplant   ??? PR TRANSPLANT,PREP DONOR LIVER, WHOLE N/A 06/24/2015    Procedure: [facility] STD PREP CAD DONOR WHOLE LIVER GFT PRIOR TNSPLNT,INC CHOLE,DISS/REM SURR TISSU WO TRISEG/LOBE SPLT;  Surgeon: Vivi Barrack, MD;  Location: MAIN OR [facility];  Service: Transplant       Social History:  Tobacco use:   reports that she has never smoked. She has never used smokeless tobacco.   Alcohol use:    reports no history of alcohol use.   Drug use:    reports no history of drug use.  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 Breast cancer Mother    ??? Thyroid cancer Mother    ??? Ovarian cancer Mother    ??? Cancer Mother    ??? COPD Father    ??? Diabetes Father    ??? Melanoma Father    ??? Other Father         pacemaker       Review of Systems:  All other systems reviewed are negative.        Vital Signs last 24 hours:  Temp:  [36.9 ??C (98.4 ??F)-37.6 ??C (99.7 ??F)] 37.2 ??C (99 ??F)  Core Temp:  [37 ??C (98.6 ??F)-37.3 ??C (99.1 ??F)] 37 ??C (98.6 ??F)  Heart Rate:  [56-88] 56  SpO2 Pulse:  [59-79] 59  Resp: [16-32] 28  BP: (110-164)/(54-78) 125/65  MAP (mmHg):  [71-106] 78  A BP-2: (94-130)/(46-63) 109/52  MAP:  [60 mmHg-79 mmHg] 68 mmHg  FiO2 (%):  [55 %-80 %] 55 %  SpO2:  [77 %-99 %] 98 %  BMI (Calculated):  [42.24] 42.24    Physical Exam:  Patient Lines/Drains/Airways Status    Active Active Lines, Drains, & Airways     Name:   Placement date:   Placement time:   Site:   Days:    ETT  7.5   07/02/20    1700     less than 1    CVC Triple Lumen 07/02/20 Non-tunneled Right Internal jugular   07/02/20    1945    Internal jugular   less than 1    NG/OG Tube Decompression;Feedings Center mouth   07/03/20    0610    Center mouth   less than 1    Urethral Catheter   07/02/20    ???    ???   1    Peripheral IV 07/02/20 Left Antecubital   07/02/20    1300    Antecubital   1    Arterial Line 07/02/20 Right Radial   07/02/20    2315    Radial   less than 1              GEN:  Ill appearing, intubated, sedated  EYES: Upward deviation w/ 2mm pupils w/ slow reaction to light  CV:RRR, no abnormal heart sounds noted and no peripheral edema  PULM:Intubated, on 100% fio2 w/ coarse lung sounds posteriorly  ZD:GUYQ, NT, distended with reproducible umbilical hernia  IH:[id number]  RECTAL:deferred  SKIN:no petechiae, ecchymoses or rashes on full inspection  MSK:no swollen joints  NEURO:tremulous w/ hyperflexed ankles, sedated, eventually  responded appropriately with head nodding and no tremor noted    Data for Medical Decision Making  ( [id number] )     Recent Labs   Lab Units 07/03/20  0327 24-Jul-2020  2053 24-Jul-2020  1820   WBC 10*9/L 5.8 8.2  --    HEMOGLOBIN g/dL 25.9 56.3  --    HEMOGLOBIN BG g/dL  --   --  87.5   PLATELET COUNT (1) 10*9/L 214 219  --    NEUTRO ABS 10*9/L  --  7.0  --    LYMPHO ABS 10*9/L  --  0.9*  --    EOSINO ABS 10*9/L  --  0.0  --    BUN mg/dL 24* 20  --    CREATININE mg/dL 6.43* 3.29*  --    AST U/L 41* 53*  --    ALT U/L 36 44  --    BILIRUBIN TOTAL mg/dL 0.3 0.4  --    ALK PHOS U/L 59 65  --    POTASSIUM WHOLE BLOOD mmol/L  --  4.0 4.0   POTASSIUM mmol/L 4.0 4.0  --    MAGNESIUM mg/dL 1.9 2.0  --    PHOSPHORUS mg/dL 3.1 2.7  --    CALCIUM mg/dL 8.0* 8.3*  --    CK TOTAL U/L  --  198.0*  --        Drug Level Results in Past 360 Days  Result Component Current Result   Tacrolimus, Timed 1.8 (07/03/2020)         New Culture Data     Microbiology Results (last day)     Procedure Component Value Date/Time Date/Time    COVID-19 PCR [[id number]]  (Abnormal) Collected: 2020-07-24 2323    Lab Status: Final result Specimen: Nasal Swab Updated: 07/03/20 0007     SARS-CoV-2 PCR Positive    Narrative:      This test was performed using the Cepheid Xpert Xpress SARS-CoV-2 assay which has been validated by the CLIA-certified, CAP-inspected [facility] Clinical Molecular [facility]. FDA has granted Emergency Use Authorization for this test. This real-time RT-PCR test detects SARS-CoV-2 by targeting the N2 and E genes. Negative results do not preclude SARS-CoV-2 infection and should not be used as the sole basis for patient management decisions. Negative results must be combined with clinical observations, patient history, and epidemiological information. Information for providers and patients can be found here: [email or web address]      MRSA Screen [[id number]] Collected: 07-24-20 2323    Lab Status: In process Specimen: Nares from Nose Updated: 2020/07/24 2324    Lower Respiratory Culture [[id number]] Collected: Jul 24, 2020 2323    Lab Status: No result Specimen: SPUTUM EXPECTORATED            Recent Studies     XR Chest Portable    Result Date: 07/03/2020  EXAM: XR CHEST PORTABLE DATE: 07/03/2020 7:16 AM ACCESSION: [id number] UN DICTATED: 07/03/2020 7:46 AM INTERPRETATION LOCATION: [facility] CLINICAL INDICATION: 54 years old Female with Increasing Vent support ; OTHER  COMPARISON: Same day chest radiograph TECHNIQUE: Portable Chest Radiograph. FINDINGS: Endotracheal tube is been retracted and terminates approximately 3 cm above the carina. Support devices otherwise unchanged. Hypoinflated lungs. Similar appearance of diffuse bilateral patchy airspace opacities. Unchanged cardiomediastinal silhouette.     Endotracheal tube has been retracted now terminating 3 cm above the carina. Otherwise no significant same interval change.    XR Chest Portable    Result Date: 07/03/2020  EXAM: XR CHEST PORTABLE DATE: 24-Jul-2020 9:26  PM ACCESSION: [id number] UN DICTATED: 07/02/2020 9:27 PM INTERPRETATION LOCATION: [facility] CLINICAL INDICATION: 54 years old Female with LINE CHECK (CATHETER VASCULAR FIT)  COMPARISON: Chest radiograph 08/06/2019 TECHNIQUE: Portable Chest Radiograph. FINDINGS: Endotracheal tube is low in position terminating approximately 0.5 cm above the carina. Right IJ central venous catheter terminates over the right atrium. Pulmonary vascular congestion and increased interstitial and airspace opacities. No pleural effusion or pneumothorax. Stable cardiomediastinal silhouette.     Endotracheal tube is low in position terminating medially at the level of the carina. Recommend retracting approximately 2 to 3 cm. Right IJ central catheter terminates over the right atrium. Mild to moderate pulmonary edema. The findings of this study were discussed via telephone with RN Milus Mallick  by Dr. Kristian Covey on 07/02/2020 9:34 PM.     XR Abdomen 1 View    Result Date: 07/03/2020  EXAM: XR ABDOMEN 1 VIEW DATE: 07/03/2020 7:18 AM ACCESSION: [id number] UN DICTATED: 07/03/2020 7:36 AM INTERPRETATION LOCATION: [facility] CLINICAL INDICATION: 54 year old Female with OGT placed ; OTHER  COMPARISON: None. TECHNIQUE: Supine view of the abdomen. FINDINGS: Enteric tube tip terminates over the right mid abdomen. Nonobstructive bowel gas pattern. Surgical clips within the upper abdomen. Opacities within the lung bases. Characterize on same-day chest radiograph.     Enteric tube tip terminates over the right midabdomen, likely within the proximal duodenum. Recommend retracting 5-10 cm for gastric placement.      Serologies:  Lab Results   Component Value Date    CMV IGG Positive (A) 06/23/2015    EBV VCA IgG Antibody Positive (A) 06/23/2015    Hep A IgG Reactive (A) 04/14/2015    Hep B Surface Ag Nonreactive 07/09/2015    Hep B S Ab Nonreactive 06/23/2015    Hep B Surf Ab Quant <8.00 06/23/2015    Hepatitis C Ab Nonreactive 07/09/2015    RPR Nonreactive 06/23/2015    HSV 1 IgG Negative 06/23/2015    HSV 2 IgG Negative 06/23/2015    Varicella IgG Positive 06/23/2015    Rubella IgG Scr Positive (A) 04/14/2015    Toxoplasma Gondii IgG Negative 06/23/2015    Quantiferon TB Gold Plus Interpretation Negative 04/14/2015    Quantiferon Mitogen Minus Nil 7.67 04/14/2015    Quantiferon Antigen 1 minus Nil 0.00 04/14/2015       Immunizations:  Immunization History   Administered Date(s) Administered   ??? Hepatitis B, Adult 05/01/2015   ??? Influenza Vaccine Quad (IIV4 PF) 9mo+ injectable 08/20/2015   ??? PNEUMOCOCCAL POLYSACCHARIDE 23 04/29/2015   ??? PPD Test 04/29/2015   ??? SHINGRIX-ZOSTER VACCINE (HZV), RECOMBINANT,SUB-UNIT,ADJUVANTED IM 07/03/2017

## 2020-07-03 NOTE — Unmapped (Signed)
Hepatology Consult Service  Initial Consultation    Assessment and Recommendations:   Evelyn Rollins is a 54 y.o. 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 on Myfortic and Tacrolimus  that presented to [facility] with ARDS 2/2 COVID19 Pneumonia. Pt is seen in consultation at the request of Evelyn Rollins, * (Medical ICU (MDI)) for assistance in management of post liver transplant care in setting of severe acute infection.    Active Problems:    Liver transplant recipient (CMS-HCC)    Morbid obesity with BMI of 40.0-44.9, adult (CMS-HCC)    Acute hypoxemic respiratory failure due to severe acute respiratory syndrome coronavirus 2 (SARS-CoV-2) disease (CMS-HCC)  Resolved Problems:    * No resolved hospital problems. 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 She received a Liver TP in 2016 at [facility]. She has had some fatty liver on follow up imaging noted. Last Liver TP [place] was ~ 1 year ago, and showed stable indices. Unfortunately, she was not vaccinated to COVID, and has become infected with COVID resulting in pneumonia and ARDS. She is now intubated and deeply sedate, being cared for by the ICU team. We are consulted to help ensure now acute liver/post transplant care recommendations are needed. At this point, her liver enzymes are ok. ALT mildly elevated at 40-50, but this perhaps more c/w with her fatty liver that has been noted previously in follow up. Albumin is low, but not surprising in setting of acute infection. In setting of acute viral infection with COVID, We would recommend holding her myfortic, and continuing Tacrolimus.     -- Hold home myfortic   -- Follow up Tac level   -- Cont home Tacrolimus, goal 3-5. Prev on Tac 2 mg in AM and 1 mg in PM   -- CTM hepatic function panel daily     Thank you for involving [place] in the care of your patient. We will continue to follow along with you. This patient was discussed with Dr. Sherryll Burger.     For questions, please contact the on-call fellow for the Hepatology Consult Service at [phone].     Reason for Consultation:   Pt was seen in consultation at the request of Evelyn Rollins, * (Medical ICU (MDI)) for assistance in management of post transplant care in setting of acute infection with COVID19.    Subjective:   HPI:  I was unable to obtain HPI as pt was deeply sedated and intubated.     Appears she had been doing well in the outpt setting overall. However, she was not vaccinated and came into contact with her daughter and son in law who both tested + for COVID. She then developed high fever, and presented to the ED at an OSH in acute hypoxic resp failure. She was transferred to [facility] for on going management.     Allergies:  Patient has no known allergies.    Medications:   Prior to Admission medications    Medication Dose, Route, Frequency   acetaminophen (TYLENOL) 325 MG tablet 650 mg, Oral, Daily PRN   amLODIPine (NORVASC) 5 MG tablet 5 mg, Oral, Daily (standard)   ARIPiprazole (ABILIFY) 5 MG tablet 5 mg, Oral, Nightly   aspirin (ECOTRIN) 81 MG tablet 81 mg, Oral, Daily (standard)   buPROPion (WELLBUTRIN XL) 300 MG 24 hr tablet 300 mg,  Oral, Daily (standard), PCP managing    calcium carbonate/vitamin D3 (CALCIUM WITH VITAMIN D ORAL) 600 mg, Oral, Every other day, Calcium 600mg  and vitamin D 200units    escitalopram oxalate (LEXAPRO) 20 MG tablet 20 mg, Oral, Daily (standard), PCP managing this    fluticasone propionate (FLONASE) 50 mcg/actuation nasal spray 2 sprays, Nasal   levothyroxine (SYNTHROID) 100 MCG tablet 100 mcg.    liothyronine (CYTOMEL) 5 MCG tablet TAKE 1 TABLET ON AN EMPTY STOMACH X 7 DAYS, THEN INCREASE TO 2 TABS IF 1 TAB TOLERATED   liraglutide (VICTOZA) injection pen 0.6 mg, Subcutaneous, Daily (standard)   LORazepam (ATIVAN) 0.5 MG tablet 1 mg, Oral, 2 times a day PRN   magnesium oxide (MAG-OX) 400 mg tablet 400 mg, Oral   magnesium oxide 150 mg magnesium Tab tablet Take by mouth.  Patient not taking: Reported on 04/21/2020   mycophenolate (MYFORTIC) 180 MG EC tablet Take 2 tablets (360mg ) by mouth twice daily.   nystatin, bulk, 15 billion unit Powd 1 application, Miscellaneous, Once as needed   pen needle, diabetic 29 gauge x 1/2 (12 mm) Ndle Use as directed once daily   pravastatin (PRAVACHOL) 40 MG tablet 40 mg, Oral, Daily (standard), PCP started and managing    progesterone (PROMETRIUM) 100 MG capsule 100 mg, Oral   tacrolimus (PROGRAF) 1 MG capsule Take 2 capsules (2mg ) by mouth in the morning and 1 capsule (1mg ) in the evening.   zolpidem (AMBIEN) 5 MG tablet Take 1 tab by mouth at bedtime       Medical History:  Past Medical History:   Diagnosis Date   ??? Cirrhosis (CMS-HCC)    ??? Hypothyroidism    ??? NAFLD (nonalcoholic fatty liver disease)    ??? Obesity    ??? Seizure (CMS-HCC) Dec 2015       Surgical History:  Past Surgical History:   Procedure Laterality Date   ??? CESAREAN SECTION  1987, 1989    2   ??? ESOPHAGOGASTRODUODENOSCOPY  recently   ??? HYSTERECTOMY  2012   ??? PR ERCP BALLOON DILATE BILIARY/PANC DUCT/AMPULLA EA N/A 09/02/2015    Procedure: ERCP;WITH TRANS-ENDOSCOPIC BALLOON DILATION OF BILIARY/PANCREATIC DUCT(S) OR OF AMPULLA, INCLUDING SPHINCTERECTOMY, WHEN PERFOREMD,EACH DUCT (09811);  Surgeon: Malcolm Metro, MD;  Location: [facility];  Service: Gastroenterology   ??? PR ERCP REMOVE FOREIGN BODY/STENT BILIARY/PANC DUCT N/A 09/02/2015    Procedure: ENDOSCOPIC RETROGRADE CHOLANGIOPANCREATOGRAPHY (ERCP); W/ REMOVAL OF FOREIGN BODY/STENT FROM BILIARY/PANCREATIC DUCT(S);  Surgeon: Malcolm Metro, MD;  Location: [facility];  Service: Gastroenterology   ??? PR ERCP STENT PLACEMENT BILIARY/PANCREATIC DUCT N/A 07/07/2015    Procedure: ENDOSCOPIC RETROGRADE CHOLANGIOPANCREATOGRAPHY (ERCP); WITH PLACEMENT OF ENDOSCOPIC STENT INTO BILIARY OR PANCREATIC DUCT;  Surgeon: Malcolm Metro, MD;  Location: GI PROCEDURES [facility];  Service: Gastroenterology   ??? PR ERCP,W/REMOVAL STONE,BIL/PANCR DUCTS N/A 09/02/2015    Procedure: ERCP; W/ENDOSCOPIC RETROGRADE REMOVAL OF CALCULUS/CALCULI FROM BILIARY &/OR PANCREATIC DUCTS;  Surgeon: Malcolm Metro, MD;  Location: [facility];  Service: Gastroenterology   ??? PR TRANSPLANT LIVER,ALLOTRANSPLANT N/A 06/24/2015    Procedure: LIVER ALLOTRANSPLANTATION; ORTHOTOPIC, PARTIAL OR WHOLE, FROM CADAVER OR LIVING DONOR, ANY AGE;  Surgeon: Vivi Barrack, MD;  Location: MAIN OR [facility];  Service: Transplant   ??? PR TRANSPLANT,PREP DONOR LIVER, WHOLE N/A 06/24/2015    Procedure: [facility] STD PREP CAD DONOR WHOLE LIVER GFT PRIOR TNSPLNT,INC CHOLE,DISS/REM SURR TISSU WO TRISEG/LOBE SPLT;  Surgeon: Vivi Barrack, MD;  Location: MAIN OR  [facility];  Service: Transplant       Social History:  Social History     Tobacco Use   ??? Smoking status: Never Smoker   ??? Smokeless tobacco: Never Used   Substance Use Topics   ??? Alcohol use: No     Alcohol/week: 0.0 standard drinks   ??? Drug use: No       Family History:  Family History   Problem Relation Age of Onset   ??? Breast cancer Mother    ??? Thyroid cancer Mother    ??? Ovarian cancer Mother    ??? Cancer Mother    ??? COPD Father    ??? Diabetes Father    ??? Melanoma Father    ??? Other Father         pacemaker       Review of Systems:  10 systems were reviewed and are negative unless otherwise mentioned in the HPI    Objective:   Physical Exam:  Temp:  [36.9 ??C-37.6 ??C] 37.2 ??C  Heart Rate:  [56-88] 56  SpO2 Pulse:  [56-79] 56  Resp:  [16-32] 28  BP: (110-164)/(54-78) 135/65  FiO2 (%):  [55 %-80 %] 55 %  SpO2:  [77 %-99 %] 96 %    Gen: sedated, intubated   HENT: atraumatic, normocephalic, et tube noted   Heart: RRR  Lungs: normal/symetric chest rise, on PRVC 75% FiO2, PEEP 16  Neuro: unable to assess due to pt sedation on versed and dilaudid   Skin:  No rashes, lesions on clothed exam    Labs/Studies:  Labs, studies, and imaging from the last 24hrs per EMR and personally reviewed.    I have reviewed and summarized prior records.    Prior GI Procedures:  Liver TP US   IMPRESSION:  -- Patent hepatic transplant vasculature. Stable resistive indices in the hepatic transplant arteries, within normal limits.   --Similar to prior exam, sluggish left portal vein velocity.  --Hepatomegaly. Hepatic steatosis.

## 2020-07-03 NOTE — Unmapped (Signed)
Problem: Adult Inpatient Plan of Care  Goal: Plan of Care Review  Outcome: Ongoing - Unchanged  Goal: Patient-Specific Goal (Individualization)  Outcome: Ongoing - Unchanged  Goal: Absence of Hospital-Acquired Illness or Injury  Outcome: Ongoing - Unchanged  Goal: Optimal Comfort and Wellbeing  Outcome: Ongoing - Unchanged     Problem: Skin Injury Risk Increased  Goal: Skin Health and Integrity  Outcome: Ongoing - Unchanged     Problem: Non-Violent Restraints  Goal: Patient will remain free of restraint events  Outcome: Ongoing - Unchanged  Goal: Patient will remain free of physical injury  Outcome: Ongoing - Unchanged     Problem: Infection  Goal: Infection Symptom Resolution  Outcome: Ongoing - Unchanged     Problem: Fall Injury Risk  Goal: Absence of Fall and Fall-Related Injury  Outcome: Ongoing - Unchanged     Problem: Communication Impairment (Mechanical Ventilation, Invasive)  Goal: Effective Communication  Outcome: Ongoing - Unchanged

## 2020-07-03 NOTE — Unmapped (Signed)
Central Venous Catheter Insertion Procedure Note     Date of Service: 07/01/2020    Patient Name: Evelyn Rollins  Patient MRN: [id number]    Line type:  Triple Lumen    Indications:  Inadequate peripheral access    Procedure Details:   Informed consent was obtained after explanation of the risks and benefits of the procedure, refer to the consent documentation.    Time-out was performed immediately prior to the procedure.    The right internal jugular vein was identified using bedside ultrasound. This area was prepped and draped in the usual sterile fashion. Maximum sterile technique was used including antiseptics, cap, gloves, gown, hand hygiene, mask, and sterile sheet.  The patient was placed in Trendelenburg position. Local anesthesia with 1% lidocaine was applied subcutaneously then deep to the skin. The finder was then inserted into the internal jugular using ultrasound guidance.    Using the Seldinger Technique a Triple Lumen was placed with each port easily flushed and freely drawing venous blood.    The catheter was secured with sutures. A sterile CHG drsg was applied to the site.    Condition:  The patient tolerated the procedure well and remains in the same condition as pre-procedure.    Complications:  None; patient tolerated the procedure well.    Plan:  CXR confirmed appropriate placement of central line. CVAD order placed OK to use     Lanae Crumbly, NP

## 2020-07-04 LAB — BLOOD GAS, ARTERIAL
BASE EXCESS ARTERIAL: -5 — ABNORMAL LOW (ref -2.0–2.0)
BASE EXCESS ARTERIAL: -3.2 — ABNORMAL LOW (ref -2.0–2.0)
BASE EXCESS ARTERIAL: -5.7 — ABNORMAL LOW (ref -2.0–2.0)
FIO2 ARTERIAL: 100
HCO3 ARTERIAL: 21 mmol/L — ABNORMAL LOW (ref 22–27)
HCO3 ARTERIAL: 22 mmol/L (ref 22–27)
HCO3 ARTERIAL: 19 mmol/L — ABNORMAL LOW (ref 22–27)
O2 SATURATION ARTERIAL: 92.5 % — ABNORMAL LOW (ref 94.0–100.0)
O2 SATURATION ARTERIAL: 95.5 % (ref 94.0–100.0)
O2 SATURATION ARTERIAL: 91.3 % — ABNORMAL LOW (ref 94.0–100.0)
PCO2 ARTERIAL: 38.8 mmHg (ref 35.0–45.0)
PCO2 ARTERIAL: 37.3 mmHg (ref 35.0–45.0)
PH ARTERIAL: 7.31 — ABNORMAL LOW (ref 7.35–7.45)
PH ARTERIAL: 7.37 (ref 7.35–7.45)
PH ARTERIAL: 7.36 (ref 7.35–7.45)
PO2 ARTERIAL: 67.3 mmHg — ABNORMAL LOW (ref 80.0–110.0)
PO2 ARTERIAL: 76.2 mmHg — ABNORMAL LOW (ref 80.0–110.0)
PO2 ARTERIAL: 66 mmHg — ABNORMAL LOW (ref 80.0–110.0)

## 2020-07-04 LAB — COMPREHENSIVE METABOLIC PANEL
ALBUMIN: 2.6 g/dL — ABNORMAL LOW (ref 3.4–5.0)
ALKALINE PHOSPHATASE: 55 U/L (ref 46–116)
ALT (SGPT): 31 U/L (ref 10–49)
AST (SGOT): 26 U/L (ref <=34)
BILIRUBIN TOTAL: 0.2 mg/dL — ABNORMAL LOW (ref 0.3–1.2)
BLOOD UREA NITROGEN: 44 mg/dL — ABNORMAL HIGH (ref 9–23)
BUN / CREAT RATIO: 29
CALCIUM: 8.5 mg/dL — ABNORMAL LOW (ref 8.7–10.4)
CHLORIDE: 111 mmol/L — ABNORMAL HIGH (ref 98–107)
CO2: 22 mmol/L (ref 20.0–31.0)
CREATININE: 1.52 mg/dL — ABNORMAL HIGH
EGFR CKD-EPI AA FEMALE: 44 mL/min/{1.73_m2} — ABNORMAL LOW (ref >=60)
EGFR CKD-EPI NON-AA FEMALE: 39 mL/min/{1.73_m2} — ABNORMAL LOW (ref >=60)
GLUCOSE RANDOM: 215 mg/dL — ABNORMAL HIGH (ref 70–179)
POTASSIUM: 4.3 mmol/L (ref 3.4–4.5)
PROTEIN TOTAL: 6 g/dL (ref 5.7–8.2)
SODIUM: 140 mmol/L (ref 135–145)

## 2020-07-04 LAB — CREATININE, URINE: Lab: 132.7

## 2020-07-04 LAB — URINALYSIS
BILIRUBIN UA: NEGATIVE
BLOOD UA: NEGATIVE
GLUCOSE UA: NEGATIVE
GRANULAR CASTS: 1 /LPF — ABNORMAL HIGH
HYALINE CASTS: 3 /LPF — ABNORMAL HIGH (ref 0–1)
KETONES UA: NEGATIVE
LEUKOCYTE ESTERASE UA: NEGATIVE
PH UA: 5 (ref 5.0–9.0)
RBC UA: 1 /HPF (ref <=4)
SPECIFIC GRAVITY UA: 1.015 (ref 1.003–1.030)
SQUAMOUS EPITHELIAL: <1 /HPF (ref 0–5)
UROBILINOGEN UA: 0.2
WBC UA: 3 /HPF (ref 0–5)

## 2020-07-04 LAB — BLOOD GAS CRITICAL CARE PANEL, ARTERIAL
BASE EXCESS ARTERIAL: -2.3 — ABNORMAL LOW (ref -2.0–2.0)
BASE EXCESS ARTERIAL: -3.4 — ABNORMAL LOW (ref -2.0–2.0)
CALCIUM IONIZED ARTERIAL (MG/DL): 4.64 mg/dL (ref 4.40–5.40)
CALCIUM IONIZED ARTERIAL (MG/DL): 4.91 mg/dL (ref 4.40–5.40)
FIO2 ARTERIAL: 60
GLUCOSE WHOLE BLOOD: 227 mg/dL — ABNORMAL HIGH (ref 70–179)
GLUCOSE WHOLE BLOOD: 219 mg/dL — ABNORMAL HIGH (ref 70–179)
HCO3 ARTERIAL: 23 mmol/L (ref 22–27)
HEMOGLOBIN BLOOD GAS: 12.9 g/dL (ref 12.00–16.00)
LACTATE BLOOD ARTERIAL: 1.5 mmol/L — ABNORMAL HIGH (ref <1.3)
LACTATE BLOOD ARTERIAL: 2.6 mmol/L — ABNORMAL HIGH (ref <1.3)
O2 SATURATION ARTERIAL: 96.1 % (ref 94.0–100.0)
O2 SATURATION ARTERIAL: 93.2 % — ABNORMAL LOW (ref 94.0–100.0)
PCO2 ARTERIAL: 43.3 mmHg (ref 35.0–45.0)
PH ARTERIAL: 7.36 (ref 7.35–7.45)
PH ARTERIAL: 7.33 — ABNORMAL LOW (ref 7.35–7.45)
PO2 ARTERIAL: 84.3 mmHg (ref 80.0–110.0)
PO2 ARTERIAL: 68.4 mmHg — ABNORMAL LOW (ref 80.0–110.0)
POTASSIUM WHOLE BLOOD: 4.2 mmol/L (ref 3.4–4.6)
POTASSIUM WHOLE BLOOD: 4 mmol/L (ref 3.4–4.6)
SODIUM WHOLE BLOOD: 143 mmol/L (ref 135–145)
SODIUM WHOLE BLOOD: 145 mmol/L (ref 135–145)

## 2020-07-04 LAB — TACROLIMUS, TROUGH: Lab: 2.3 — ABNORMAL LOW

## 2020-07-04 LAB — CBC
HEMATOCRIT: 39.4 % (ref 36.0–46.0)
MEAN CORPUSCULAR HEMOGLOBIN CONC: 32.1 g/dL (ref 31.0–37.0)
MEAN CORPUSCULAR HEMOGLOBIN: 28.7 pg (ref 26.0–34.0)
MEAN CORPUSCULAR VOLUME: 89.4 fL (ref 80.0–100.0)
MEAN PLATELET VOLUME: 8.9 fL (ref 7.0–10.0)
PLATELET COUNT: 403 10*9/L (ref 150–440)
RED BLOOD CELL COUNT: 4.4 10*12/L (ref 4.00–5.20)
RED CELL DISTRIBUTION WIDTH: 14.6 % (ref 12.0–15.0)
WBC ADJUSTED: 10.5 10*9/L (ref 4.5–11.0)

## 2020-07-04 LAB — PHOSPHORUS: Phosphate:MCnc:Pt:Ser/Plas:Qn:: 3.9

## 2020-07-04 LAB — PO2 ARTERIAL: Oxygen:PPres:Pt:BldA:Qn:: 66 — ABNORMAL LOW

## 2020-07-04 LAB — PH ARTERIAL
pH:LsCnc:Pt:BldA:Qn:: 7.36
pH:LsCnc:Pt:BldA:Qn:: 7.37

## 2020-07-04 LAB — SPECIMEN SOURCE

## 2020-07-04 LAB — POTASSIUM WHOLE BLOOD: Potassium:SCnc:Pt:Bld:Qn:: 4

## 2020-07-04 LAB — D-DIMER QUANTITATIVE (CW,ML,HL): Lab: 1467 — ABNORMAL HIGH

## 2020-07-04 LAB — WBC ADJUSTED: Leukocytes:NCnc:Pt:Bld:Qn:: 10.5

## 2020-07-04 LAB — SQUAMOUS EPITHELIAL: Lab: <1

## 2020-07-04 LAB — CALCIUM: Calcium:MCnc:Pt:Ser/Plas:Qn:: 8.5 — ABNORMAL LOW

## 2020-07-04 LAB — HCO3 ARTERIAL: Bicarbonate:SCnc:Pt:BldA:Qn:: 21 — ABNORMAL LOW

## 2020-07-04 LAB — UREA NITROGEN URINE: Lab: 1446

## 2020-07-04 LAB — MAGNESIUM: Magnesium:MCnc:Pt:Ser/Plas:Qn:: 2.4

## 2020-07-04 MED ADMIN — tacrolimus (PROGRAF) oral suspension: 2 mg | GASTROENTERAL | @ 13:00:00

## 2020-07-04 MED ADMIN — LORazepam (ATIVAN) tablet 2 mg: 2 mg | ORAL | @ 20:00:00

## 2020-07-04 MED ADMIN — midazolam (PF) (VERSED) 1 mg/mL injection: @ 17:00:00 | Stop: 2020-07-04

## 2020-07-04 MED ADMIN — oxyCODONE (ROXICODONE) immediate release tablet 20 mg: 20 mg | ORAL | @ 03:00:00 | Stop: 2020-07-17

## 2020-07-04 MED ADMIN — HYDROmorphone (PF) (DILAUDID) 1 mg/mL injection: INTRAVENOUS | @ 17:00:00 | Stop: 2020-07-04

## 2020-07-04 MED ADMIN — LORazepam (ATIVAN) tablet 2 mg: 2 mg | ORAL | @ 03:00:00

## 2020-07-04 MED ADMIN — oxyCODONE (ROXICODONE) immediate release tablet 20 mg: 20 mg | ORAL | Stop: 2020-07-17

## 2020-07-04 MED ADMIN — levothyroxine (SYNTHROID) tablet 100 mcg: 100 ug | GASTROENTERAL | @ 09:00:00

## 2020-07-04 MED ADMIN — insulin lispro (HumaLOG) injection 0-12 Units: 0-12 [IU] | SUBCUTANEOUS | @ 14:00:00

## 2020-07-04 MED ADMIN — oxyCODONE (ROXICODONE) immediate release tablet 20 mg: 20 mg | ORAL | @ 20:00:00 | Stop: 2020-07-17

## 2020-07-04 MED ADMIN — LORazepam (ATIVAN) tablet 2 mg: 2 mg | ORAL | @ 07:00:00

## 2020-07-04 MED ADMIN — LORazepam (ATIVAN) tablet 2 mg: 2 mg | ORAL | @ 15:00:00

## 2020-07-04 MED ADMIN — LORazepam (ATIVAN) tablet 2 mg: 2 mg | ORAL

## 2020-07-04 MED ADMIN — polyethylene glycol (MIRALAX) packet 17 g: 17 g | ORAL

## 2020-07-04 MED ADMIN — HYDROmorphone 1 mg/mL in 0.9% sodium chloride: 0-10 mg/h | INTRAVENOUS | @ 02:00:00 | Stop: 2020-07-16

## 2020-07-04 MED ADMIN — HYDROmorphone 1 mg/mL in 0.9% sodium chloride: 0-10 mg/h | INTRAVENOUS | @ 20:00:00 | Stop: 2020-07-16

## 2020-07-04 MED ADMIN — dexamethasone (DECADRON) 4 mg/mL injection 6 mg: 6 mg | INTRAVENOUS | @ 22:00:00 | Stop: 2020-07-12

## 2020-07-04 MED ADMIN — oxyCODONE (ROXICODONE) immediate release tablet 20 mg: 20 mg | ORAL | @ 13:00:00 | Stop: 2020-07-17

## 2020-07-04 MED ADMIN — polyethylene glycol (MIRALAX) packet 17 g: 17 g | ORAL | @ 13:00:00

## 2020-07-04 MED ADMIN — midazolam (VERSED) injection 2 mg: 2 mg | @ 17:00:00

## 2020-07-04 MED ADMIN — midazolam in sodium chloride 0.9% (1 mg/mL) infusion PMB: 0-6 mg/h | INTRAVENOUS | @ 23:00:00

## 2020-07-04 MED ADMIN — chlorhexidine (PERIDEX) 0.12 % solution 5 mL: 5 mL | OROMUCOSAL | @ 12:00:00

## 2020-07-04 NOTE — Unmapped (Signed)
[facility] Nightshift Note     Date of Service: 07/04/2020    Active Problems:    Liver transplant recipient (CMS-HCC)    Morbid obesity with BMI of 40.0-44.9, adult (CMS-HCC)    Acute hypoxemic respiratory failure due to severe acute respiratory syndrome coronavirus 2 (SARS-CoV-2) disease (CMS-HCC)      Plan summary     Pt with EEG on overnight. Does not open eyes or respond to commands so will attempt to decrease sedation. Pt with non-focal neuro exam earlier in the evening per primary team. ABG stable.     Esther Hardy, MD

## 2020-07-04 NOTE — Unmapped (Signed)
HEPATOLOGY INPATIENT TREATMENT PLAN NOTE     Interval History:     Remains deeply sedated and intubated. Norepi started overnight.     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 She received a Liver TP in 2016 at [facility]. She has had some fatty liver on follow up imaging noted. Last Liver TP [place] was ~ 1 year ago, and showed stable indices. Admitted for ARDS 2/2 COVID19 pneumonia. Remains intubated and deeply sedate. Liver enzymes stable. ??  -- Cont to hold home myfortic   -- Follow up Tac level (pending), goal 3-5  -- Cont home Tac 2 mg in AM and 1 mg in PM   -- CTM hepatic function panel daily   ??    Thank you for this consult. They patient was Discussed with  Dr. Sherryll Burger. We will continue to follow along. Please page hepatology fellow on call with questions.     Heron Sabins, MD  Gastroenterology and Hepatology Fellow

## 2020-07-04 NOTE — Unmapped (Signed)
Initial Consult Note        Requesting Attending Physician:  Richrd Prime Fischer, *  Service Requesting Consult: Medical ICU (MDI)     Assessment and Plan          Evelyn Rollins is a 54 y.o. female with PMH of cirrhosis s/p liver transplant in 2016 on chronic immunosuppression (tacrolimus and myfortic), HTN, diabetes, and hypothyroidism on whom I have been asked by [name], * to consult for possible seizure activity.    Seizure-like activity: Two witnessed episodes on 7/23, described as shaking of the trunk, which progressed to the arms bilaterally, then the legs bilaterally, with upward eye deviation. Pt returned to baseline within 3 minutes after the first episode; she was able to follow commands throughout the second. Overall, exam is reassuring without clear focal neurologic deficits; pt was alert and moving all extremities to command symmetrically. Therefore, we agree with deferring head imaging at this time. Of note, she had one prior seizure in 2015 (unclear if provoked) for which she was briefly on keppra. Given this history and particularly in the setting of acute infection, these episodes may have been seizures, and we would recommend video EEG for further characterization. Additionally, with respect to neurotoxicity of calcineurin inhibitors, literature suggests some association between tacrolimus and PRES; we have low concern for PRES at this time, but would recommend considering MRI brain if there is a decline in neurologic exam.      Recommendations:  - please order video EEG   - agree with increasing versed gtt and holding off on starting AED at this time  - consider imaging if decline in neurologic exam  - neurology will continue to follow. Please page the neurology consult pager [phone]) with any questions.       This patient was discussed with Dr. Marry Guan, who agrees to the assessment and plan. Dr. Nadara Mode was available.     Sung Amabile, MD  PGY-2, [facility] Neurology        HPI Reason for Consult: seizure-like activity    Evelyn Rollins is a 54 y.o. female with PMH of cirrhosis s/p liver transplant in 2016 on chronic immunosuppression, HTN, diabetes, and hypothyroidism on whom I have been asked by Richrd Prime Fischer to consult for seizure-like activity.    Pt's nurse reported that she had had episodes of tremulousness since this morning. She then witnessed seizure-like activity at about 4:30pm, described as shaking of her trunk, then shaking of her arms bilaterally, followed by shaking of her legs bilaterally. Eyes were open with upward gaze. It lasted about 35 seconds; pt did not respond to commands during the episode and for 3 minutes afterwards, before returning to baseline. She had a similar episode less than 30 minutes after (exact duration unclear, but less than 5 minutes), though she was able to respond to commands throughout. Versed gtt was increased. Seizure-like activity resolved, and pt was not loaded with an AED.    Of note, upon chart review and history obtained from pt's husband, she had a seizure in December 2015 for which she was briefly on keppra. Has not been on keppra for years and has been seizure-free since.       No Known Allergies       Current Facility-Administered Medications   Medication Dose Route Frequency Provider Last Rate Last Admin   ??? aspirin chewable tablet 81 mg  81 mg Oral Daily Lanae Crumbly, NP   81 mg at 07/03/20 1235   ???  chlorhexidine (PERIDEX) 0.12 % solution 5 mL  5 mL Mouth BID [name], MD   5 mL at 07/03/20 0757   ??? dexamethasone (DECADRON) 4 mg/mL injection 6 mg  6 mg Intravenous Q24H Lanae Crumbly, NP   6 mg at 06/20/2020 2045   ??? dextrose 50 % in water (D50W) 50 % solution 12.5 g  12.5 g Intravenous Q10 Min PRN Lanae Crumbly, NP       ??? enoxaparin (LOVENOX) syringe 40 mg  40 mg Subcutaneous Q12H Lanae Crumbly, NP   40 mg at 07/03/20 1505   ??? HYDROmorphone 1 mg/mL in 0.9% sodium chloride  0-10 mg/hr Intravenous Continuous Lanae Crumbly, NP 3 mL/hr at 07/03/20 1330 3 mg/hr at 07/03/20 1330   ??? insulin lispro (HumaLOG) injection 0-12 Units  0-12 Units Subcutaneous ACHS Lanae Crumbly, NP   2 Units at 07/03/20 1222   ??? levothyroxine (SYNTHROID) tablet 100 mcg  100 mcg Enteral tube: gastric  daily Lanae Crumbly, NP   100 mcg at 07/03/20 1235   ??? LORazepam (ATIVAN) tablet 2 mg  2 mg Oral Q4H Lanae Crumbly, NP   2 mg at 07/03/20 1531   ??? midazolam in sodium chloride 0.9% (1 mg/mL) infusion PMB  0-6 mg/hr Intravenous Continuous Lanae Crumbly, NP 6 mL/hr at 07/03/20 1645 6 mg/hr at 07/03/20 1645   ??? norepinephrine 8 mg in sodium chloride 0.9 % 250 mL (107mcg/mL) infusion PMB  0-30 mcg/min Intravenous Continuous Lanae Crumbly, NP 3.8 mL/hr at 07/03/20 1703 2 mcg/min at 07/03/20 1703   ??? oxyCODONE (ROXICODONE) immediate release tablet 20 mg  20 mg Oral Q4H Lanae Crumbly, NP   20 mg at 07/03/20 1531   ??? polyethylene glycol (MIRALAX) packet 17 g  17 g Oral TID Lanae Crumbly, NP   17 g at 07/03/20 1303   ??? pravastatin (PRAVACHOL) tablet 40 mg  40 mg Oral Daily Lanae Crumbly, NP   40 mg at 07/03/20 1235   ??? remdesivir (VEKLURY) 100 mg in sodium chloride (NS) 0.9 % 295 mL IVPB  100 mg Intravenous Daily Lanae Crumbly, NP   Stopped at 07/03/20 1013   ??? senna (SENOKOT) tablet 2 tablet  2 tablet Oral BID Lanae Crumbly, NP   2 tablet at 07/03/20 6606   ??? [START ON 07/04/2020] tacrolimus (PROGRAF) oral suspension  2 mg Enteral tube: gastric  Daily [name], MD       ??? tacrolimus (PROGRAF) oral suspension  1 mg Enteral tube: gastric  Nightly (2000) [name], MD           Past Medical History:   Diagnosis Date   ??? Cirrhosis (CMS-HCC)    ??? Hypothyroidism    ??? NAFLD (nonalcoholic fatty liver disease)    ??? Obesity    ??? Seizure (CMS-HCC) Dec 2015       Past Surgical History:   Procedure Laterality Date   ??? CESAREAN SECTION  1987, 1989    2   ??? ESOPHAGOGASTRODUODENOSCOPY  recently   ??? HYSTERECTOMY  2012   ??? PR ERCP BALLOON DILATE BILIARY/PANC DUCT/AMPULLA EA N/A 09/02/2015    Procedure: ERCP;WITH TRANS-ENDOSCOPIC BALLOON DILATION OF BILIARY/PANCREATIC DUCT(S) OR OF AMPULLA, INCLUDING SPHINCTERECTOMY, WHEN PERFOREMD,EACH DUCT (30160);  Surgeon: Malcolm Metro, MD;  Location: [facility];  Service: Gastroenterology   ??? PR ERCP REMOVE FOREIGN BODY/STENT BILIARY/PANC DUCT N/A 09/02/2015    Procedure: ENDOSCOPIC  RETROGRADE CHOLANGIOPANCREATOGRAPHY (ERCP); W/ REMOVAL OF FOREIGN BODY/STENT FROM BILIARY/PANCREATIC DUCT(S);  Surgeon: Malcolm Metro, MD;  Location: GI [facility];  Service: Gastroenterology   ??? PR ERCP STENT PLACEMENT BILIARY/PANCREATIC DUCT N/A 07/07/2015    Procedure: ENDOSCOPIC RETROGRADE CHOLANGIOPANCREATOGRAPHY (ERCP); WITH PLACEMENT OF ENDOSCOPIC STENT INTO BILIARY OR PANCREATIC DUCT;  Surgeon: Malcolm Metro, MD;  Location: [facility];  Service: Gastroenterology   ??? PR ERCP,W/REMOVAL STONE,BIL/PANCR DUCTS N/A 09/02/2015    Procedure: ERCP; W/ENDOSCOPIC RETROGRADE REMOVAL OF CALCULUS/CALCULI FROM BILIARY &/OR PANCREATIC DUCTS;  Surgeon: Malcolm Metro, MD;  Location: [facility];  Service: Gastroenterology   ??? PR TRANSPLANT LIVER,ALLOTRANSPLANT N/A 06/24/2015    Procedure: LIVER ALLOTRANSPLANTATION; ORTHOTOPIC, PARTIAL OR WHOLE, FROM CADAVER OR LIVING DONOR, ANY AGE;  Surgeon: Vivi Barrack, MD;  Location: MAIN OR [facility];  Service: Transplant   ??? PR TRANSPLANT,PREP DONOR LIVER, WHOLE N/A 06/24/2015    Procedure: [facility] STD PREP CAD DONOR WHOLE LIVER GFT PRIOR TNSPLNT,INC CHOLE,DISS/REM SURR TISSU WO TRISEG/LOBE SPLT;  Surgeon: Vivi Barrack, MD;  Location: MAIN OR [facility];  Service: Transplant       Social History     Socioeconomic History   ??? Marital status: Married     Spouse name: Not on file   ??? Number of children: Not on file   ??? Years of education: Not on file   ??? Highest education level: Not on file   Occupational History   ??? Not on file   Tobacco Use   ??? Smoking status: Never Smoker   ??? Smokeless tobacco: Never Used   Substance and Sexual Activity   ??? Alcohol use: No     Alcohol/week: 0.0 standard drinks   ??? Drug use: No   ??? Sexual activity: Not on file   Other Topics Concern   ??? Not on file   Social History Narrative    Living situation: the patient lives with her husband.    Address [place], [place], [place]): [place], [place], [place]    Guardian/Payee: None        Family Contact: none provided    Outpatient Providers: no psychiatric providers    Relationship Status: Married     Children: Yes; two children and one stepchild    Education: HS graduate    Income/Employment/Disability: Doesn't currently work. 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 Informant: the patient     Domestic Violence: None known. Informant: the patient     Exposure/Witness to Violence: None known    Protective Services Involvement: None known    Current/Prior Legal: None known    Physical Aggression/Violence: None known    Access to Firearms: Yes, Unsecured     Gang Involvement: None known        Psychiatric History:    Diagnoses:    None        Inpatient hospitalizations:    None        Outpatient treatment:    None        Medication trials:    None per patient. Per medical chart, she received Valium for a brief period of time for some anxiety.        Suicide attempts/Self-injury:    None        Substance Use:    None        Family Psychiatric History:    None  Social Determinants of Health     Financial Resource Strain:    ??? Difficulty of Paying Living Expenses:    Food Insecurity:    ??? Worried About Programme researcher, broadcasting/film/video in the Last Year:    ??? Barista in the Last Year:    Transportation Needs:    ??? Freight forwarder (Medical):    ??? Lack of Transportation (Non-Medical):    Physical Activity:    ??? Days of Exercise per Week:    ??? Minutes of Exercise per Session:    Stress:    ??? Feeling of Stress :    Social Connections:    ??? Frequency of Communication with Friends and Family:    ??? Frequency of Social Gatherings with Friends and Family:    ??? Attends Religious Services:    ??? Database administrator or Organizations:    ??? Attends Banker Meetings:    ??? Marital Status:        Family History   Problem Relation Age of Onset   ??? Breast cancer Mother    ??? Thyroid cancer Mother    ??? Ovarian cancer Mother    ??? Cancer Mother    ??? COPD Father    ??? Diabetes Father    ??? Melanoma Father    ??? Other Father         pacemaker       Code Status: Full Code     Review of Systems     Unable to obtain as pt is intubated.       Objective        Temp:  [36.9 ??C-37.6 ??C] 37.2 ??C  Core Temp:  [36.7 ??C-37.3 ??C] 36.8 ??C  Heart Rate:  [56-88] 59  SpO2 Pulse:  [56-79] 58  Resp:  [16-32] 28  BP: (110-164)/(54-78) 135/65  MAP (mmHg):  [71-106] 88  A BP-2: (94-130)/(46-63) 108/46  MAP:  [60 mmHg-80 mmHg] 63 mmHg  FiO2 (%):  [55 %-80 %] 55 %  SpO2:  [77 %-99 %] 95 %  BMI (Calculated):  [42.24] 42.24  I/O this shift:  In: 684 [I.V.:44; NG/GT:50; IV Piggyback:590]  Out: 320 [Urine:220; Emesis/NG output:100]    Physical Exam:  General Appearance: Intubated  HEENT: Head is atraumatic and normocephalic. Sclera anicteric without injection.}  Lungs: Ventilated  Heart: Regular rate and rhythm.  Abdomen: Nondistended.  Extremities: No clubbing or cyanosis.    Neurological Examination:     Mental Status: Intubated. Arouses to verbal stimulus. Follows commands briskly.     Cranial Nerves: PERRL. Gaze midline. Pursuit eye movements were uninterrupted with full range and without more than end-gaze nystagmus. Face symmetric at rest. Hearing intact to conversation.     Motor Exam: Normal bulk.  No tremors, myoclonus, or other adventitious movement.  Pronator drift is absent.  Moves all extremities spontaneously and symmetrically. Follows commands to give thumbs up bilaterally and wiggle toes bilaterally.    Reflexes: Deferred    Sensory: Intact to light touch in all extremities    Cerebellar/Coordination/Gait: Deferred            Diagnostic Studies      All Labs Last 24hrs:   Recent Results (from the past 24 hour(s))   Carboxyhemoglobin/POC    Collection Time: 06/27/2020  6:20 PM   Result Value Ref Range    Carboxyhemoglobin <1.0 <1.2 %   GLUCOSE-BG/POC    Collection Time: 06/30/2020  6:20 PM   Result Value  Ref Range    Glucose Whole Blood 207 (H) 70 - 179 mg/dL   POCT Arterial Blood Gas    Collection Time: 07/05/2020  6:20 PM   Result Value Ref Range    pH, Arterial 7.21 (L) 7.35 - 7.45    pCO2, Arterial 51 (H) 35 - 45 mm[Hg]    pO2, Arterial 99 80 - 110 mm[Hg]    HCO3 (Bicarbonate), Arterial 20.6 (L) 22.0 - 27.0 mmol/L    Base Excess, Arterial -7.3 (L) -2.0 - 2.0 mmol/L    O2 Sat, Arterial 96.2 94.0 - 100.0 %    Specimen Type, Arterial Arterial     FIO2 100 %   HEMOGLOBIN BG/POC    Collection Time: 07/06/2020  6:20 PM   Result Value Ref Range    Hemoglobin 14.5 12.0 - 16.0 g/dL   POCT Ionized Calcium    Collection Time: 06/20/2020  6:20 PM   Result Value Ref Range    Ionized Calcium 4.7 4.4 - 5.4 mg/dL   POTASSIUM-BG/POC    Collection Time: 06/26/2020  6:20 PM   Result Value Ref Range    Potassium, Bld 4.0 3.4 - 4.6 mmol/L   POCT Lactic Acid (Lactate), Arterial    Collection Time: 06/23/2020  6:20 PM   Result Value Ref Range    Lactate, Arterial 0.9 <1.3 mmol/L   METHEMOGLOBIN/POC    Collection Time: 06/19/2020  6:20 PM   Result Value Ref Range    Methemoglobin <1.0 <1.5 %   SODIUM-BG/POC    Collection Time: 06/23/2020  6:20 PM   Result Value Ref Range    Sodium Whole Blood 139 135 - 145 mmol/L   POCT Arterial Oxyhemoglobin    Collection Time: 07/10/2020  6:20 PM   Result Value Ref Range    Oxyhemoglobin 94.8 94.0 - 100.0 %   POCT Glucose    Collection Time: 06/12/2020  8:35 PM   Result Value Ref Range    Glucose, POC 207 (H) 70 - 179 mg/dL   CK Total and CKMB    Collection Time: 06/19/2020  8:53 PM   Result Value Ref Range    Creatine Kinase, Total 198.0 (H) 34.0 - 145.0 U/L    CK-MB 2.11 0.00 - 5.00 ng/mL    CK Index 1.1 %   Comprehensive metabolic panel    Collection Time: 06/24/2020  8:53 PM   Result Value Ref Range    Sodium 141 135 - 145 mmol/L    Potassium 4.0 3.4 - 4.5 mmol/L    Chloride 110 (H) 98 - 107 mmol/L    Anion Gap 8 5 - 14 mmol/L    CO2 23.0 20.0 - 31.0 mmol/L    BUN 20 9 - 23 mg/dL    Creatinine 1.61 (H) 0.60 - 0.80 mg/dL    BUN/Creatinine Ratio 17     EGFR CKD-EPI Non-African American, Female 53 (L) >=60 mL/min/1.55m2    EGFR CKD-EPI African American, Female 61 >=60 mL/min/1.6m2    Glucose 213 (H) 70 - 179 mg/dL    Calcium 8.3 (L) 8.7 - 10.4 mg/dL    Albumin 2.7 (L) 3.4 - 5.0 g/dL    Total Protein 6.8 5.7 - 8.2 g/dL    Total Bilirubin 0.4 0.3 - 1.2 mg/dL    AST 53 (H) <=09 U/L    ALT 44 10 - 49 U/L    Alkaline Phosphatase 65 46 - 116 U/L   D-Dimer, Quantitative    Collection Time: 07/01/2020  8:53 PM  Result Value Ref Range    D-Dimer 1,722 (H) <=500 ng/mL FEU   PT-INR    Collection Time: 07-10-2020  8:53 PM   Result Value Ref Range    PT 12.5 10.5 - 13.5 sec    INR 1.05    C-reactive protein    Collection Time: July 10, 2020  8:53 PM   Result Value Ref Range    CRP 131.0 (H) <=10.0 mg/L   Phosphorus Level    Collection Time: 2020/07/10  8:53 PM   Result Value Ref Range    Phosphorus 2.7 2.4 - 5.1 mg/dL   Magnesium Level    Collection Time: 07-10-20  8:53 PM   Result Value Ref Range    Magnesium 2.0 1.6 - 2.6 mg/dL   CBC w/ Differential    Collection Time: July 10, 2020  8:53 PM   Result Value Ref Range    WBC 8.2 4.5 - 11.0 10*9/L    RBC 4.92 4.00 - 5.20 10*12/L    HGB 14.2 12.0 - 16.0 g/dL    HCT 16.1 09.6 - 04.5 %    MCV 89.0 80.0 - 100.0 fL    MCH 28.8 26.0 - 34.0 pg    MCHC 32.3 31.0 - 37.0 g/dL    RDW 40.9 81.1 - 91.4 %    MPV 9.2 7.0 - 10.0 fL    Platelet 219 150 - 440 10*9/L    Variable HGB Concentration Slight (A) Not Present    Neutrophils % 84.9 %    Lymphocytes % 10.7 % Monocytes % 3.3 %    Eosinophils % 0.0 %    Basophils % 0.3 %    Neutrophil Left Shift 1+ (A) Not Present    Absolute Neutrophils 7.0 2.0 - 7.5 10*9/L    Absolute Lymphocytes 0.9 (L) 1.5 - 5.0 10*9/L    Absolute Monocytes 0.3 0.2 - 0.8 10*9/L    Absolute Eosinophils 0.0 0.0 - 0.4 10*9/L    Absolute Basophils 0.0 0.0 - 0.1 10*9/L    Large Unstained Cells 1 0 - 4 %    Hypochromasia Slight (A) Not Present   Blood Gas Critical Care Panel, Venous    Collection Time: July 10, 2020  8:53 PM   Result Value Ref Range    Specimen Source CVAD     FIO2 Venous Not Specified     pH, Venous 7.26 (L) 7.32 - 7.43    pCO2, Ven 50 40 - 60 mm Hg    pO2, Ven 46 30 - 55 mm Hg    HCO3, Ven 21 (L) 22 - 27 mmol/L    Base Excess, Ven -4.6 (L) -2.0 - 2.0    O2 Saturation, Venous 75.8 40.0 - 85.0 %    Sodium Whole Blood 142 135 - 145 mmol/L    Potassium, Bld 4.0 3.4 - 4.6 mmol/L    Calcium, Ionized Venous 4.62 4.40 - 5.40 mg/dL    Glucose Whole Blood 208 (H) 70 - 179 mg/dL    Lactate, Venous 1.8 0.5 - 1.8 mmol/L    Hgb, blood gas 10.10 (L) 12.00 - 16.00 g/dL   Urinalysis    Collection Time: 07-10-2020  8:55 PM   Result Value Ref Range    Color, UA Amber     Clarity, UA Turbid     Specific Gravity, UA 1.026 1.003 - 1.030    pH, UA 5.0 5.0 - 9.0    Leukocyte Esterase, UA Negative Negative    Nitrite, UA Negative Negative    Protein,  UA 100 mg/dL (A) Negative    Glucose, UA 50 mg/dL (A) Negative    Ketones, UA Trace (A) Negative    Urobilinogen, UA 0.2 mg/dL 0.2 mg/dL, 1.0 mg/dL    Bilirubin, UA Negative Negative    Blood, UA Negative Negative    RBC, UA 4 <=4 /HPF    WBC, UA 40 (H) 0 - 5 /HPF    Squam Epithel, UA <1 0 - 5 /HPF    Bacteria, UA None Seen None Seen /HPF    Granular Casts, UA 111 (H) 0 /LPF    Hyaline Casts, UA 84 (H) 0 - 1 /LPF    Mucus, UA Many (A) None Seen /HPF   COVID-19 PCR    Collection Time: 07/18/20 11:23 PM    Specimen: Nasal Swab   Result Value Ref Range    SARS-CoV-2 PCR Positive (A) Negative   Blood Gas, Arterial Specify Site: Arterial; FIO2 Arterial: 100%    Collection Time: 07/18/2020 11:26 PM   Result Value Ref Range    Specimen Source Arterial     FIO2 Arterial 100%     pH, Arterial 7.39 7.35 - 7.45    pO2, Arterial 115.0 (H) 80.0 - 110.0 mm Hg    pCO2, Arterial 29.3 (L) 35.0 - 45.0 mm Hg    HCO3 (Bicarbonate), Arterial 17 (L) 22 - 27 mmol/L    Base Excess, Arterial -6.7 (L) -2.0 - 2.0    O2 Sat, Arterial 98.3 94.0 - 100.0 %   CBC    Collection Time: 07/03/20  3:27 AM   Result Value Ref Range    WBC 5.8 4.5 - 11.0 10*9/L    RBC 4.49 4.00 - 5.20 10*12/L    HGB 12.9 12.0 - 16.0 g/dL    HCT 16.1 09.6 - 04.5 %    MCV 89.0 80.0 - 100.0 fL    MCH 28.7 26.0 - 34.0 pg    MCHC 32.2 31.0 - 37.0 g/dL    RDW 40.9 81.1 - 91.4 %    MPV 10.0 7.0 - 10.0 fL    Platelet 214 150 - 440 10*9/L   Comprehensive metabolic panel    Collection Time: 07/03/20  3:27 AM   Result Value Ref Range    Sodium 142 135 - 145 mmol/L    Potassium 4.0 3.4 - 4.5 mmol/L    Chloride 112 (H) 98 - 107 mmol/L    Anion Gap 7 5 - 14 mmol/L    CO2 23.0 20.0 - 31.0 mmol/L    BUN 24 (H) 9 - 23 mg/dL    Creatinine 7.82 (H) 0.60 - 0.80 mg/dL    BUN/Creatinine Ratio 18     EGFR CKD-EPI Non-African American, Female 45 (L) >=60 mL/min/1.79m2    EGFR CKD-EPI African American, Female 52 (L) >=60 mL/min/1.41m2    Glucose 188 (H) 70 - 179 mg/dL    Calcium 8.0 (L) 8.7 - 10.4 mg/dL    Albumin 2.5 (L) 3.4 - 5.0 g/dL    Total Protein 6.3 5.7 - 8.2 g/dL    Total Bilirubin 0.3 0.3 - 1.2 mg/dL    AST 41 (H) <=95 U/L    ALT 36 10 - 49 U/L    Alkaline Phosphatase 59 46 - 116 U/L   Magnesium Level    Collection Time: 07/03/20  3:27 AM   Result Value Ref Range    Magnesium 1.9 1.6 - 2.6 mg/dL   Phosphorus Level    Collection Time: 07/03/20  3:27  AM   Result Value Ref Range    Phosphorus 3.1 2.4 - 5.1 mg/dL   Blood Gas, Arterial Specify Site: Arterial; FIO2 Arterial: 100%    Collection Time: 07/03/20  3:27 AM   Result Value Ref Range    Specimen Source Arterial     FIO2 Arterial 100%     pH, Arterial 7.29 (L) 7.35 - 7.45    pO2, Arterial 71.5 (L) 80.0 - 110.0 mm Hg    pCO2, Arterial 45.1 (H) 35.0 - 45.0 mm Hg    HCO3 (Bicarbonate), Arterial 21 (L) 22 - 27 mmol/L    Base Excess, Arterial -4.8 (L) -2.0 - 2.0    O2 Sat, Arterial 92.8 (L) 94.0 - 100.0 %   POCT Glucose    Collection Time: 07/03/20  6:49 AM   Result Value Ref Range    Glucose, POC 177 70 - 179 mg/dL   Blood Gas, Arterial Specify Site: Arterial; FIO2 Arterial: 100%    Collection Time: 07/03/20  6:51 AM   Result Value Ref Range    Specimen Source Arterial     FIO2 Arterial 100%     pH, Arterial 7.29 (L) 7.35 - 7.45    pO2, Arterial 72.0 (L) 80.0 - 110.0 mm Hg    pCO2, Arterial 42.1 35.0 - 45.0 mm Hg    HCO3 (Bicarbonate), Arterial 20 (L) 22 - 27 mmol/L    Base Excess, Arterial -6.4 (L) -2.0 - 2.0    O2 Sat, Arterial 93.5 (L) 94.0 - 100.0 %   Blood Gas, Arterial Specify Site: Arterial; FIO2 Arterial: 100%    Collection Time: 07/03/20  8:59 AM   Result Value Ref Range    Specimen Source Arterial     FIO2 Arterial 100%     pH, Arterial 7.28 (L) 7.35 - 7.45    pO2, Arterial 93.7 80.0 - 110.0 mm Hg    pCO2, Arterial 39.7 35.0 - 45.0 mm Hg    HCO3 (Bicarbonate), Arterial 18 (L) 22 - 27 mmol/L    Base Excess, Arterial -7.5 (L) -2.0 - 2.0    O2 Sat, Arterial 96.7 94.0 - 100.0 %   Tacrolimus Level, Timed    Collection Time: 07/03/20  8:59 AM   Result Value Ref Range    Tacrolimus, Timed 1.8 ng/mL   POCT Glucose    Collection Time: 07/03/20 12:11 PM   Result Value Ref Range    Glucose, POC 161 70 - 179 mg/dL   Urea Nitrogen, Random Urine    Collection Time: 07/03/20 12:25 PM   Result Value Ref Range    Urea Nitrogen, Ur 536 Undefined mg/dL   Blood Gas, Arterial Specify Site: Arterial; FIO2 Arterial: Not Specified    Collection Time: 07/03/20 12:30 PM   Result Value Ref Range    Specimen Source Arterial     FIO2 Arterial Not Specified     pH, Arterial 7.30 (L) 7.35 - 7.45    pO2, Arterial 85.4 80.0 - 110.0 mm Hg    pCO2, Arterial 40.1 35.0 - 45.0 mm Hg    HCO3 (Bicarbonate), Arterial 19 (L) 22 - 27 mmol/L    Base Excess, Arterial -6.5 (L) -2.0 - 2.0    O2 Sat, Arterial 95.6 94.0 - 100.0 %   POCT Glucose    Collection Time: 07/03/20  3:04 PM   Result Value Ref Range    Glucose, POC 135 70 - 179 mg/dL   Blood Gas, Arterial Specify Site: Arterial; FIO2 Arterial: 100%  Collection Time: 07/03/20  3:05 PM   Result Value Ref Range    Specimen Source Arterial     FIO2 Arterial 100%     pH, Arterial 7.33 (L) 7.35 - 7.45    pO2, Arterial 83.6 80.0 - 110.0 mm Hg    pCO2, Arterial 36.3 35.0 - 45.0 mm Hg    HCO3 (Bicarbonate), Arterial 18 (L) 22 - 27 mmol/L    Base Excess, Arterial -6.5 (L) -2.0 - 2.0    O2 Sat, Arterial 95.6 94.0 - 100.0 %   Hemoglobin A1c    Collection Time: 07/03/20  4:34 PM   Result Value Ref Range    Hemoglobin A1C 5.6 4.8 - 5.6 %    Estimated Average Glucose 114 mg/dL

## 2020-07-04 NOTE — Unmapped (Signed)
Pt remains on same settings overnight, tolerating well, ETT secure and patent. NAD noted at this time.

## 2020-07-04 NOTE — Unmapped (Signed)
Follow-up Consult Note        Requesting Attending Physician:  Richrd Prime Fischer, *  Service Requesting Consult: Medical ICU (MDI)     Assessment and Plan          Evelyn Rollins is a 54 y.o. female with PMH of cirrhosis s/p liver transplant in 2016 on chronic immunosuppression (tacrolimus and myfortic), HTN, diabetes, and hypothyroidism on whom I have been asked by [name], * to consult for possible seizure activity.    Seizure-like activity - new RUE weakness: Two witnessed episodes on 7/23, described as shaking of the trunk, which progressed to the arms bilaterally, then the legs bilaterally, with upward eye deviation. Pt returned to baseline within 3 minutes after the first episode; she was able to follow commands throughout the second. Evolving exam, with focality noted on re-evaluation, specifically diminished movements of right upper extremity. Multiple push-botton events during evaluation on 7/24 for diffuse upper and lower limb shaking, activated upon stimulation. With the evolution of possible focal deficit, would be more interested in MR imaging, as differential more concerning for PRES/tacrolimus toxicity verses acute stroke in the setting of SARS CoV-2 w/ARDS. No epileptiform activity on EEG thus far, however will recommend continued to monitoring for now.     Recommendations   - MRI brain w/wo contrast   - Continued cvEEG monitoring for now. Can attempt midazolam gtt wean   - Neurology will continue to follow. Please page the neurology consult pager [phone]) with any questions.     This patient was seen and discussed with Dr. Seward Meth who agrees to the assessment and plan.    Owens Loffler, DO, PGY-2, [facility] Neurology          HPI/Interval Events         Reason for Consult: seizure-like activity    Evelyn Rollins is a 54 y.o. female with PMH of cirrhosis s/p liver transplant in 2016 on chronic immunosuppression, HTN, diabetes, and hypothyroidism on whom I have been asked by Richrd Prime Fischer to consult for seizure-like activity.    Pt's nurse reported that she had had episodes of tremulousness since this morning. She then witnessed seizure-like activity at about 4:30pm, described as shaking of her trunk, then shaking of her arms bilaterally, followed by shaking of her legs bilaterally. Eyes were open with upward gaze. It lasted about 35 seconds; pt did not respond to commands during the episode and for 3 minutes afterwards, before returning to baseline. She had a similar episode less than 30 minutes after (exact duration unclear, but less than 5 minutes), though she was able to respond to commands throughout. Versed gtt was increased. Seizure-like activity resolved, and pt was not loaded with an AED.    Of note, upon chart review and history obtained from pt's husband, she had a seizure in December 2015 for which she was briefly on keppra. Has not been on keppra for years and has been seizure-free since.     Interval events: No epileptiform activity since starting EEG monitoring.     No Known Allergies       Current Facility-Administered Medications   Medication Dose Route Frequency Provider Last Rate Last Admin   ??? aspirin chewable tablet 81 mg  81 mg Oral Daily Lanae Crumbly, NP   81 mg at 07/04/20 0865   ??? chlorhexidine (PERIDEX) 0.12 % solution 5 mL  5 mL Mouth BID [name], MD   5 mL at 07/04/20 0801   ??? dexamethasone (  DECADRON) 4 mg/mL injection 6 mg  6 mg Intravenous Q24H Lanae Crumbly, NP   6 mg at 07/03/20 1800   ??? dextrose 50 % in water (D50W) 50 % solution 12.5 g  12.5 g Intravenous Q10 Min PRN Lanae Crumbly, NP       ??? enoxaparin (LOVENOX) syringe 40 mg  40 mg Subcutaneous Q12H Lanae Crumbly, NP   40 mg at 07/04/20 0305   ??? HYDROmorphone (PF) (DILAUDID) injection 4 mg  4 mg Intravenous Q4H PRN Lanae Crumbly, NP   4 mg at 07/04/20 1233   ??? HYDROmorphone 1 mg/mL in 0.9% sodium chloride  0-10 mg/hr Intravenous Continuous Lanae Crumbly, NP 5 mL/hr at 07/04/20 0618 5 mg/hr at 07/04/20 0618   ??? insulin lispro (HumaLOG) injection 0-12 Units  0-12 Units Subcutaneous ACHS Lanae Crumbly, NP   2 Units at 07/04/20 8119   ??? levothyroxine (SYNTHROID) tablet 100 mcg  100 mcg Enteral tube: gastric  daily Lanae Crumbly, NP   100 mcg at 07/04/20 0525   ??? LORazepam (ATIVAN) tablet 2 mg  2 mg Oral Q4H Lanae Crumbly, NP   2 mg at 07/04/20 1118   ??? midazolam (VERSED) injection 2 mg  2 mg Intravenous Q4H PRN Lanae Crumbly, NP   Given at 07/04/20 1232   ??? midazolam in sodium chloride 0.9% (1 mg/mL) infusion PMB  0-6 mg/hr Intravenous Continuous Lanae Crumbly, NP 5 mL/hr at 07/04/20 0600 5 mg/hr at 07/04/20 0600   ??? norepinephrine 8 mg in sodium chloride 0.9 % 250 mL (19mcg/mL) infusion PMB  0-30 mcg/min Intravenous Continuous Lanae Crumbly, NP   Stopped at 07/04/20 0835   ??? oxyCODONE (ROXICODONE) immediate release tablet 20 mg  20 mg Oral Q4H Lanae Crumbly, NP   20 mg at 07/04/20 1117   ??? polyethylene glycol (MIRALAX) packet 17 g  17 g Oral TID Lanae Crumbly, NP   17 g at 07/04/20 0844   ??? pravastatin (PRAVACHOL) tablet 40 mg  40 mg Oral Daily Lanae Crumbly, NP   40 mg at 07/04/20 0843   ??? remdesivir (VEKLURY) 100 mg in sodium chloride (NS) 0.9 % 295 mL IVPB  100 mg Intravenous Daily Lanae Crumbly, NP   Stopped at 07/04/20 1103   ??? senna (SENOKOT) tablet 2 tablet  2 tablet Oral BID Lanae Crumbly, NP   2 tablet at 07/04/20 [phone]   ??? tacrolimus (PROGRAF) oral suspension  2 mg Enteral tube: gastric  Daily [name], MD   2 mg at 07/04/20 0844   ??? tacrolimus (PROGRAF) oral suspension  1 mg Enteral tube: gastric  Nightly (2000) [name], MD   1 mg at 07/03/20 2026       Past Medical History:   Diagnosis Date   ??? Cirrhosis (CMS-HCC)    ??? Hypothyroidism    ??? NAFLD (nonalcoholic fatty liver disease)    ??? Obesity    ??? Seizure (CMS-HCC) Dec 2015       Past Surgical History: Procedure Laterality Date   ??? CESAREAN SECTION  1987, 1989    2   ??? ESOPHAGOGASTRODUODENOSCOPY  recently   ??? HYSTERECTOMY  2012   ??? PR ERCP BALLOON DILATE BILIARY/PANC DUCT/AMPULLA EA N/A 09/02/2015    Procedure: ERCP;WITH TRANS-ENDOSCOPIC BALLOON DILATION OF BILIARY/PANCREATIC DUCT(S) OR OF AMPULLA, INCLUDING SPHINCTERECTOMY, WHEN PERFOREMD,EACH DUCT (29562);  Surgeon: Malcolm Metro, MD;  [facility];  Service: Gastroenterology   ??? PR ERCP REMOVE FOREIGN BODY/STENT BILIARY/PANC DUCT N/A 09/02/2015    Procedure: ENDOSCOPIC RETROGRADE CHOLANGIOPANCREATOGRAPHY (ERCP); W/ REMOVAL OF FOREIGN BODY/STENT FROM BILIARY/PANCREATIC DUCT(S);  Surgeon: Malcolm Metro, MD;  [facility]: [facility];  Service: Gastroenterology   ??? PR ERCP STENT PLACEMENT BILIARY/PANCREATIC DUCT N/A 07/07/2015    Procedure: ENDOSCOPIC RETROGRADE CHOLANGIOPANCREATOGRAPHY (ERCP); WITH PLACEMENT OF ENDOSCOPIC STENT INTO BILIARY OR PANCREATIC DUCT;  Surgeon: Malcolm Metro, MD;  Location: [facility];  Service: Gastroenterology   ??? PR ERCP,W/REMOVAL STONE,BIL/PANCR DUCTS N/A 09/02/2015    Procedure: ERCP; W/ENDOSCOPIC RETROGRADE REMOVAL OF CALCULUS/CALCULI FROM BILIARY &/OR PANCREATIC DUCTS;  Surgeon: Malcolm Metro, MD;  Location: [facility];  Service: Gastroenterology   ??? PR TRANSPLANT LIVER,ALLOTRANSPLANT N/A 06/24/2015    Procedure: LIVER ALLOTRANSPLANTATION; ORTHOTOPIC, PARTIAL OR WHOLE, FROM CADAVER OR LIVING DONOR, ANY AGE;  Surgeon: Vivi Barrack, MD;  Location: MAIN OR [facility];  Service: Transplant   ??? PR TRANSPLANT,PREP DONOR LIVER, WHOLE N/A 06/24/2015    Procedure: [facility] STD PREP CAD DONOR WHOLE LIVER GFT PRIOR TNSPLNT,INC CHOLE,DISS/REM SURR TISSU WO TRISEG/LOBE SPLT;  Surgeon: Vivi Barrack, MD;  Location: MAIN OR [facility];  Service: Transplant       Social History     Socioeconomic History   ??? Marital status: Married     Spouse name: Not on file   ??? Number of children: Not on file   ??? Years of education: Not on file   ??? Highest education level: Not on file   Occupational History   ??? Not on file   Tobacco Use   ??? Smoking status: Never Smoker   ??? Smokeless tobacco: Never Used   Substance and Sexual Activity   ??? Alcohol use: No     Alcohol/week: 0.0 standard drinks   ??? Drug use: No   ??? Sexual activity: Not on file   Other Topics Concern   ??? Not on file   Social History Narrative    Living situation: the patient lives with her husband.    Address [place], [place], [place]): [place], [place], [place]    Guardian/Payee: None        Family Contact: none provided    Outpatient Providers: no psychiatric providers    Relationship Status: Married     Children: Yes; two children and one stepchild    Education: HS graduate    Income/Employment/Disability: Doesn't currently work. 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 Informant: the patient     Domestic Violence: None known. Informant: the patient     Exposure/Witness to Violence: None known    Protective Services Involvement: None known    Current/Prior Legal: None known    Physical Aggression/Violence: None known    Access to Firearms: Yes, Unsecured     Gang Involvement: None known        Psychiatric History:    Diagnoses:    None        Inpatient hospitalizations:    None        Outpatient treatment:    None        Medication trials:    None per patient. Per medical chart, she received Valium for a brief period of time for some anxiety.        Suicide attempts/Self-injury:    None        Substance Use:    None  Family Psychiatric History:    None                 Social Determinants of Psychologist, prison and probation services Strain:    ??? Difficulty of Paying Living Expenses:    Food Insecurity:    ??? Worried About Programme researcher, broadcasting/film/video in the Last Year:    ??? Barista in the Last Year:    Transportation Needs:    ??? Freight forwarder (Medical):    ??? Lack of Transportation (Non-Medical):    Physical Activity:    ??? Days of Exercise per Week:    ??? Minutes of Exercise per Session:    Stress:    ??? Feeling of Stress :    Social Connections:    ??? Frequency of Communication with Friends and Family:    ??? Frequency of Social Gatherings with Friends and Family:    ??? Attends Religious Services:    ??? Database administrator or Organizations:    ??? Attends Banker Meetings:    ??? Marital Status:        Family History   Problem Relation Age of Onset   ??? Breast cancer Mother    ??? Thyroid cancer Mother    ??? Ovarian cancer Mother    ??? Cancer Mother    ??? COPD Father    ??? Diabetes Father    ??? Melanoma Father    ??? Other Father         pacemaker       Code Status: Full Code     Review of Systems     Unable to obtain as pt is intubated.       Objective        Core Temp:  [36.5 ??C-37.2 ??C] 36.5 ??C  Heart Rate:  [55-65] 62  SpO2 Pulse:  [55-65] 62  Resp:  [21-52] 28  BP: (132-141)/(64-66) 141/64  MAP (mmHg):  [85-89] 89  A BP-2: (105-144)/(46-66) 118/58  MAP:  [63 mmHg-86 mmHg] 74 mmHg  FiO2 (%):  [50 %-65 %] 50 %  SpO2:  [91 %-98 %] 94 %  I/O this shift:  In: 322.2 [I.V.:27.2; IV Piggyback:295]  Out: 250 [Urine:250]    Physical Exam:  General Appearance: Intubated  HEENT: Head is atraumatic and normocephalic. Sclera anicteric without injection.}  Lungs: Ventilated  Heart: Regular rate and rhythm.  Abdomen: Nondistended.  Extremities: No clubbing or cyanosis.    Neurological Examination on midazolam:     Mental Status: Intubated. Arouses to noxious stimulus. Does not follow commands.     Cranial Nerves: Disconjugate gaze.    Motor Exam: Normal bulk.  Diffuse myoclonic jerking in upper and lower extremities with activation, no negative myoclonus      LUE: Localizes to noxious  RUE: Weak flicker to noxious  LLE: withdraw to noxious   RLE: withdraw to noxious        Reflexes: Deferred    Sensory: noxious per above     Cerebellar/Coordination/Gait: Deferred            Diagnostic Studies      All Labs Last 24hrs:   Recent Results (from the past 24 hour(s))   POCT Glucose    Collection Time: 07/03/20  3:04 PM   Result Value Ref Range    Glucose, POC 135 70 - 179 mg/dL   Blood Gas, Arterial Specify Site: Arterial; FIO2 Arterial: 100%    Collection Time: 07/03/20  3:05 PM   Result Value Ref Range    Specimen Source Arterial     FIO2 Arterial 100%     pH, Arterial 7.33 (L) 7.35 - 7.45    pO2, Arterial 83.6 80.0 - 110.0 mm Hg    pCO2, Arterial 36.3 35.0 - 45.0 mm Hg    HCO3 (Bicarbonate), Arterial 18 (L) 22 - 27 mmol/L    Base Excess, Arterial -6.5 (L) -2.0 - 2.0    O2 Sat, Arterial 95.6 94.0 - 100.0 %   Hemoglobin A1c    Collection Time: 07/03/20  4:34 PM   Result Value Ref Range    Hemoglobin A1C 5.6 4.8 - 5.6 %    Estimated Average Glucose 114 mg/dL   POCT Glucose    Collection Time: 07/03/20  8:15 PM   Result Value Ref Range    Glucose, POC 109 70 - 179 mg/dL   Blood Gas, Arterial Specify Site: Arterial; FIO2 Arterial: 100%    Collection Time: 07/03/20  8:17 PM   Result Value Ref Range    Specimen Source Arterial     FIO2 Arterial 100%     pH, Arterial 7.30 (L) 7.35 - 7.45    pO2, Arterial 67.5 (L) 80.0 - 110.0 mm Hg    pCO2, Arterial 41.6 35.0 - 45.0 mm Hg    HCO3 (Bicarbonate), Arterial 20 (L) 22 - 27 mmol/L    Base Excess, Arterial -5.4 (L) -2.0 - 2.0    O2 Sat, Arterial 92.4 (L) 94.0 - 100.0 %   Blood Gas, Arterial Specify Site: Arterial; FIO2 Arterial: 100%    Collection Time: 07/04/20  1:47 AM   Result Value Ref Range    Specimen Source Arterial     FIO2 Arterial 100%     pH, Arterial 7.31 (L) 7.35 - 7.45    pO2, Arterial 67.3 (L) 80.0 - 110.0 mm Hg    pCO2, Arterial 42.5 35.0 - 45.0 mm Hg    HCO3 (Bicarbonate), Arterial 21 (L) 22 - 27 mmol/L    Base Excess, Arterial -5.0 (L) -2.0 - 2.0    O2 Sat, Arterial 92.5 (L) 94.0 - 100.0 %   CBC    Collection Time: 07/04/20  3:13 AM   Result Value Ref Range    WBC 10.5 4.5 - 11.0 10*9/L    RBC 4.40 4.00 - 5.20 10*12/L    HGB 12.6 12.0 - 16.0 g/dL HCT 16.1 09.6 - 04.5 %    MCV 89.4 80.0 - 100.0 fL    MCH 28.7 26.0 - 34.0 pg    MCHC 32.1 31.0 - 37.0 g/dL    RDW 40.9 81.1 - 91.4 %    MPV 8.9 7.0 - 10.0 fL    Platelet 403 150 - 440 10*9/L   Comprehensive metabolic panel    Collection Time: 07/04/20  3:13 AM   Result Value Ref Range    Sodium 140 135 - 145 mmol/L    Potassium 4.3 3.4 - 4.5 mmol/L    Chloride 111 (H) 98 - 107 mmol/L    Anion Gap 7 5 - 14 mmol/L    CO2 22.0 20.0 - 31.0 mmol/L    BUN 44 (H) 9 - 23 mg/dL    Creatinine 7.82 (H) 0.60 - 0.80 mg/dL    BUN/Creatinine Ratio 29     EGFR CKD-EPI Non-African American, Female 39 (L) >=60 mL/min/1.4m2    EGFR CKD-EPI African American, Female 44 (L) >=60 mL/min/1.69m2    Glucose 215 (H) 70 -  179 mg/dL    Calcium 8.5 (L) 8.7 - 10.4 mg/dL    Albumin 2.6 (L) 3.4 - 5.0 g/dL    Total Protein 6.0 5.7 - 8.2 g/dL    Total Bilirubin 0.2 (L) 0.3 - 1.2 mg/dL    AST 26 <=96 U/L    ALT 31 10 - 49 U/L    Alkaline Phosphatase 55 46 - 116 U/L   D-Dimer, Quantitative    Collection Time: 07/04/20  3:13 AM   Result Value Ref Range    D-Dimer 1,467 (H) <=500 ng/mL FEU   Magnesium Level    Collection Time: 07/04/20  3:13 AM   Result Value Ref Range    Magnesium 2.4 1.6 - 2.6 mg/dL   Phosphorus Level    Collection Time: 07/04/20  3:13 AM   Result Value Ref Range    Phosphorus 3.9 2.4 - 5.1 mg/dL   Blood Gas Critical Care Panel, Arterial    Collection Time: 07/04/20  6:43 AM   Result Value Ref Range    Specimen Source Arterial     FIO2 Arterial 60%     pH, Arterial 7.36 7.35 - 7.45    pCO2, Arterial 40.9 35.0 - 45.0 mm Hg    pO2, Arterial 84.3 80.0 - 110.0 mm Hg    HCO3 (Bicarbonate), Arterial 22 22 - 27 mmol/L    Base Excess, Arterial -2.3 (L) -2.0 - 2.0    O2 Sat, Arterial 96.1 94.0 - 100.0 %    Sodium Whole Blood 143 135 - 145 mmol/L    Potassium, Bld 4.2 3.4 - 4.6 mmol/L    Calcium, Ionized Arterial 4.64 4.40 - 5.40 mg/dL    Glucose Whole Blood 227 (H) 70 - 179 mg/dL    Lactate, Arterial 1.5 (H) <1.3 mmol/L    Hgb, blood gas 12.90 12.00 - 16.00 g/dL   POCT Glucose    Collection Time: 07/04/20  8:58 AM   Result Value Ref Range    Glucose, POC 192 (H) 70 - 179 mg/dL   Blood Gas, Arterial Specify Site: Arterial; FIO2 Arterial: 100%    Collection Time: 07/04/20  9:03 AM   Result Value Ref Range    Specimen Source Arterial     FIO2 Arterial 100%     pH, Arterial 7.37 7.35 - 7.45    pO2, Arterial 76.2 (L) 80.0 - 110.0 mm Hg    pCO2, Arterial 38.6 35.0 - 45.0 mm Hg    HCO3 (Bicarbonate), Arterial 22 22 - 27 mmol/L    Base Excess, Arterial -2.8 (L) -2.0 - 2.0    O2 Sat, Arterial 95.5 94.0 - 100.0 %   Creatinine, Urine    Collection Time: 07/04/20  9:54 AM   Result Value Ref Range    Creat U 132.7 Undefined mg/dL   Urinalysis    Collection Time: 07/04/20  9:54 AM   Result Value Ref Range    Color, UA Yellow     Clarity, UA Clear     Specific Gravity, UA 1.015 1.003 - 1.030    pH, UA 5.0 5.0 - 9.0    Leukocyte Esterase, UA Negative Negative    Nitrite, UA Negative Negative    Protein, UA Trace (A) Negative    Glucose, UA Negative Negative    Ketones, UA Negative Negative    Urobilinogen, UA 0.2 mg/dL     Bilirubin, UA Negative Negative    Blood, UA Negative Negative    RBC, UA 1 <=4 /HPF  WBC, UA 3 0 - 5 /HPF    Squam Epithel, UA <1 0 - 5 /HPF    Bacteria, UA Rare (A) None Seen /HPF    Granular Casts, UA 1 (H) 0 /LPF    Hyaline Casts, UA 3 (H) 0 - 1 /LPF    Mucus, UA Rare (A) None Seen /HPF   Urea Nitrogen, Random Urine    Collection Time: 07/04/20  9:55 AM   Result Value Ref Range    Urea Nitrogen, Ur 1,446 Undefined mg/dL   POCT Glucose    Collection Time: 07/04/20 11:21 AM   Result Value Ref Range    Glucose, POC 150 70 - 179 mg/dL

## 2020-07-04 NOTE — Unmapped (Addendum)
Tacrolimus Therapeutic Monitoring Pharmacy Note    Jaala Bohle is a 54 y.o. female continuing tacrolimus.     Indication: Liver transplant     Date of Transplant: 2016      Prior Dosing Information: Home regimen  tacrolimus 2mg  am and 1mg  pm     Goals:  Therapeutic Drug Levels  Tacrolimus trough goal: 3-5 ng/mL per Consult note 07/03/20    Additional Clinical Monitoring/Outcomes  ?? Monitor renal function (SCr and urine output) and liver function (LFTs)  ?? Monitor for signs/symptoms of adverse events (e.g., hyperglycemia, hyperkalemia, hypomagnesemia, hypertension, headache, tremor)    Results:   Tacrolimus level:  tacro =   2.3 ng/mL at 0646  drawn ~ 10h after last dose (7/23 ~2045)    Pharmacokinetic Considerations and Significant Drug Interactions:  ??? Concurrent hepatotoxic medications: none identified at time of note  ??? Concurrent CYP3A4 substrates/inhibitors: none identified at time of note  ??? Concurrent nephrotoxic medications: none identified at time of note    Assessment/Plan:  UNLESS INDICATED OTHERWISE BY TRANSPLANT TEAM    ASSESSMENT:  7/23 evening tacrolimus changed to susp via NG given pt intubated at this time.    Recommendation(s)  ??? Continue tacro SUSP 2 mg QAM and 1 mg QPM, slightly uptrending from yesterday  ??? Restart Myfortic after acute illness per Consult team    Follow-up  ??? Daily with AM labs (between 0600-0800, within 1h preferred) PRIOR to morning dose .   ??? A pharmacist will continue to monitor and recommend levels as appropriate    Longitudinal Dose Monitoring:  Date Dose (mg), Route AM Scr (mg/dL) Level  (ng/mL) Key Drug Interactions                        07/25       07/24 2mg  SUSP at 0844  (1mg  susp at x) 1.52 2.3    07/23 1mg  SUSP 2026 1.3 1.8 at 0859 -prior to adm         Please page service pharmacist with questions/clarifications.    Drue Flirt, PharmD   July 04, 2020 12:41 PM    Orvan Falconer, PharmD

## 2020-07-04 NOTE — Unmapped (Signed)
MICU Daily Progress Note     Date of Service: 07/04/2020    Problem List:   Active Problems:    Liver transplant recipient (CMS-HCC)    Morbid obesity with BMI of 40.0-44.9, adult (CMS-HCC)    Acute hypoxemic respiratory failure due to severe acute respiratory syndrome coronavirus 2 (SARS-CoV-2) disease (CMS-HCC)  Resolved Problems:    * No resolved hospital problems. *      Interval history:  Evelyn Rollins is a 54 y.o. female with PMH cirrhosis status post liver transplant 2016 on chronic immunosuppressive therapy, hypertension, diabetes, hypothyroidism who presented to [facility] on 7/22 with myalgias, sore throat, loss of taste and fevers  x5 days. When her symptoms had started, she took a home Covid test on 7/17 which was found to be positive. She has had poor p.o. intake for the last several days. Her husband also has similar symptoms. In the OSH ED she was found to be severely hypoxic with O2 saturations of 50% on room air. She was placed on a nonrebreather and current saturations increased to the low 90s. Chest x-ray shows bilateral infiltrates consistent with COVID-19 pneumonia. Her Covid test is positive. Inflammatory markers are elevated. She received a dose of Decadron in the emergency room.    24hr events:   -no acute events    Neurological   Analgesia and Sedation  -dilaudid and versed gtt  -PRN dilaudid and versed  -ativan 2q4h, oxy 20q4h  ??  Anxiety, depression  -holding home meds:  ??  Seizure history, ?seizure activity  -neurology consult  -vEEG negative for seizure    Pulmonary   Hypoxic respiratory failure 2/2 COVID ARDS  -continue low tidal volume ventilation  -ABG q4h  -prone if P:F <100    Cardiovascular   HTN Hx  -holding home meds    Renal   AKI  -fe urea pending   -baseline Scr .8-1.1    Infectious Disease/Autoimmune   #COVID 19 infection  Symptom onset:??7/17  Exposure: unknown  COVID PCR+: 7/17  OSH Admission:??7/22  Day of admission/transfer: 7/22  OSH Meds Given:??dexamethasone  COVID Specific??Meds:??dexamethasone  Vaccination status: not vaccinated  ??  Monitoring:  - Special airborne/contact precautions (If unavailable, droplet & contact precautions)  - f/u daily CMP, CBC w/ diff, CRP, D-dimer, DIC, troponin, pro-BNP with weekly ferritin, LDH and cytokine level  - f/u q8h lactate; ABG  - f/u CXR  ??  Medications:  - remdesivir started 7/22, hold if (1) ALT >=5x ULN or (2) s/s liver inflammation or (3) elevated ALT w inc indirect Bili, alkphos or INR  - cont decadron 6mg  IV daily x 10d, started 7/22  ??  #CAP/VAP  - afebrile, leukocytosis   - no abx indicated   - pan cx for T>38.2   - f/u cx data     Cultures:  No results found for: BLOOD CULTURE, URINE CULTURE, LOWER RESPIRATORY CULTURE  WBC (10*9/L)   Date Value   07/04/2020 10.5     WBC, UA (/HPF)   Date Value   07/04/2020 3          FEN/GI   -trickle tube feeds  -bowel regimen  -PPI  ??    Malnutrition Assessment: Not done yet.     Malnutrition Assessment: Not done yet.  #Immunocompromised s/p Liver transplant in 2016  -continue tacrolimus  -HOLD cellcept    Heme/Coag   Hypercoagulable state 2/2 covid  -anticoagulation group C  -lovenox  -LE dopplers pendig    Endocrine  History of DM2  -ISS  ??    Integumentary     #  - WOCN consulted for high risk skin assessment Yes.  - WOCN recs >> pending   - cont pressure mitigating precautions per skin policy    Prophylaxis/LDA/Restraints/Consults   Can CVC be removed? No: need for medications requiring central access (e.g. pressors)   Can A-line be removed? No: frequent ABGs  Can Foley be removed? No: Need continuous I/O  Mobility plan: Step 1 - Range of motion    Feeding: Trickle feeds, advance as tolerated  Analgesia: No pain issues  Sedation SAT/SBT: No PEEP > 8  Thromboembolic ppx: Enoxaparin  Head of bed >30 degrees: Yes  Ulcer ppx: Yes, coagulopathy  Glucose within target range: Yes, in range    Does patient need/have an active type/screen? NA    RASS at goal? Yes  [place] Agitation Assessment Scale (RASS) : -3 (07/04/2020  6:18 AM)     Can antipsychotics be stopped? N/A, not on antipsychotics  CAM-ICU Result: Positive (07/04/2020  6:00 AM)      Would hospice care be appropriate for this patient? No, patient improving or expected to improve    Patient Lines/Drains/Airways Status    Active Active Lines, Drains, & Airways     Name:   Placement date:   Placement time:   Site:   Days:    ETT  7.5   07/06/2020    1700     1    CVC Triple Lumen 07/10/2020 Non-tunneled Right Internal jugular   07/10/2020    1945    Internal jugular   1    NG/OG Tube Decompression;Feedings Center mouth   07/03/20    0610    Center mouth   1    Urethral Catheter   07/09/2020    ???    ???   2    Peripheral IV 06/18/2020 Left Antecubital   06/22/2020    1300    Antecubital   2    Arterial Line 07/07/2020 Right Radial   06/17/2020    2315    Radial   1              Patient Lines/Drains/Airways Status    Active Wounds     None                Goals of Care     Code Status: Full Code    Designated Healthcare Decision Maker:  Ms. Orzechowski current decisional capacity for healthcare decision-making is incapacitated. Her designated Educational psychologist) is/are her husband .      Subjective     Intubated, sedated    Objective     Vitals - past 24 hours  Heart Rate:  [55-66] 66  SpO2 Pulse:  [55-66] 66  Resp:  [21-52] 28  BP: (132-141)/(64-65) 141/64  FiO2 (%):  [50 %-65 %] 60 %  SpO2:  [91 %-98 %] 95 % Intake/Output  I/O last 3 completed shifts:  In: 1701.9 [I.V.:391.9; NG/GT:180; IV Piggyback:1130]  Out: 1155 [Urine:1055; Emesis/NG output:100]     ??  Physical Exam:   General: NAD, well nourished, obese, intubated, sedated   HEENT:  normocephalic, trachea mdl, anicteric, no scleral edema, no conjuctival erythema/drainage, MMM and pink   CV: RRR. S1 and S2 normal, no m/r/c/g. no bruit, or JVD. DP Pulses 2  equal. No BLE edema.  Lungs:   chest rise symm, diminished bases. CTA bilat, no wheezes/crackles/rhonchi. Good air movement.  Skin:   w/d/i, no jaundice present, no rashes, lesions, petechiae or breakdown. no drng, erythema.    Abd: contour, abdomen soft, non-tender and not distended. Normoactive bowel sounds x 4 quads, no rebound tenderness or guarding.   Ext: No cyanosis, bruising, clubbing or edema.       Continuous Infusions:   ??? HYDROmorphone 5 mg/hr (07/04/20 0618)   ??? midazolam (1 mg/mL) infusion 5 mg/hr (07/04/20 0600)   ??? norepinephrine bitartrate-NS Stopped (07/04/20 0835)       Scheduled Medications:   ??? aspirin  81 mg Oral Daily   ??? chlorhexidine  5 mL Mouth BID   ??? dexamethasone  6 mg Intravenous Q24H   ??? enoxaparin (LOVENOX) injection  40 mg Subcutaneous Q12H   ??? insulin lispro  0-12 Units Subcutaneous ACHS   ??? levothyroxine  100 mcg Enteral tube: gastric  daily   ??? LORazepam  2 mg Oral Q4H   ??? oxyCODONE  20 mg Oral Q4H   ??? polyethylen glycol  17 g Oral TID   ??? pravastatin  40 mg Oral Daily   ??? remdesivir  100 mg Intravenous Daily   ??? senna  2 tablet Oral BID   ??? Tacrolimus  2 mg Enteral tube: gastric  Daily   ??? Tacrolimus  1 mg Enteral tube: gastric  Nightly (2000)       PRN medications:  dextrose 50 % in water (D50W), HYDROmorphone, midazolam    Data/Imaging Review: Reviewed in Epic and personally interpreted on 07/04/2020. See EMR for detailed results.    I personally provided critical care services to assess, manipulate, and/ or support vital system functions(s) to treat single or multiple vital organ system failure and/or to prevent further life threatening deterioration of the patient???s condition of covid ARDS. This included 45 minutes that I personally spent performing critical care services, excluding procedures.  Lanae Crumbly, NP

## 2020-07-04 NOTE — Unmapped (Signed)
DAILY REAL TIME INTERIM LONGTERM VIDEO EEG MONITORING NOTE      REPORT NOT YET FINAL - Please see final report under PROCEDURE tab in CHART REVIEW when study completed      HISTORY   Per chart, Evelyn Rollins is 54 y.o. years old female with evaluation of seizure or seizure like activity.    PROCEDURE  Continuous video-EEG was performed while awake utilizing 21 active electrodes placed according to the international 10-20 system.  The study was recorded digitally with a bandpass of 1-70Hz  and a sampling rate of 200Hz  and was reviewed with the possibility of multiple reformatting. The study was digitally processed with potential spike and seizure events identified for physician analysis and review.  Patient recognized events were identified by a push button marker and reviewed by the physician.  Background activity was reviewed from the continuously recorded data. Simultaneous video was reviewed for all patient events.      TECHNICAL DESCRIPTION:    Day 1 (07/03/20 @21 :15 - 07/04/20 @7 :00)    Stupor/Coma  No posterior dominant rhythm is observed and no bilateral beta activity. There is a 0.5-1Hz , 20-40uV, continuous generalized slow, which is polymorphic, reactive and variable, with overriding low-voltage delta/theta frequencies    Sleep  No N2 sleep structures observed    Events  None. No push button events    EKG recording shows a regular heart rhythm of 60-80 bpm.      CLASSIFICATION:   - Continuous slow, generalized    IMPRESSION   This day 1 of continuous EEG monitoring is consistent with moderate encephalopathy.  There were no seizures or epileptiform activity. No push buttons.

## 2020-07-04 NOTE — Unmapped (Signed)
Sedated on versed and dilaudid gtt, sedation down titrated overnight to reduce sedation for neuro checks. EEG monitoring ongoing. Levo at 2 to maintain MAP >65. Rate vent settings. Abdominal hernia noted. WCTM    Problem: Adult Inpatient Plan of Care  Goal: Plan of Care Review  Outcome: Ongoing - Unchanged  Goal: Patient-Specific Goal (Individualization)  Outcome: Ongoing - Unchanged  Goal: Absence of Hospital-Acquired Illness or Injury  Outcome: Ongoing - Unchanged  Goal: Optimal Comfort and Wellbeing  Outcome: Ongoing - Unchanged  Goal: Readiness for Transition of Care  Outcome: Ongoing - Unchanged  Goal: Rounds/Family Conference  Outcome: Ongoing - Unchanged     Problem: Skin Injury Risk Increased  Goal: Skin Health and Integrity  Outcome: Ongoing - Unchanged     Problem: Non-Violent Restraints  Goal: Patient will remain free of restraint events  Outcome: Ongoing - Unchanged  Goal: Patient will remain free of physical injury  Outcome: Ongoing - Unchanged     Problem: Infection  Goal: Infection Symptom Resolution  Outcome: Ongoing - Unchanged     Problem: Fall Injury Risk  Goal: Absence of Fall and Fall-Related Injury  Outcome: Ongoing - Unchanged     Problem: Wound  Goal: Optimal Wound Healing  Outcome: Ongoing - Unchanged     Problem: Communication Impairment (Mechanical Ventilation, Invasive)  Goal: Effective Communication  Outcome: Ongoing - Unchanged     Problem: Device-Related Complication Risk (Mechanical Ventilation, Invasive)  Goal: Optimal Device Function  Outcome: Ongoing - Unchanged     Problem: Inability to Wean (Mechanical Ventilation, Invasive)  Goal: Mechanical Ventilation Liberation  Outcome: Ongoing - Unchanged     Problem: Skin and Tissue Injury (Mechanical Ventilation, Invasive)  Goal: Absence of Device-Related Skin and Tissue Injury  Outcome: Ongoing - Unchanged     Problem: Ventilator-Induced Lung Injury (Mechanical Ventilation, Invasive)  Goal: Absence of Ventilator-Induced Lung Injury Outcome: Ongoing - Unchanged     Problem: Self-Care Deficit  Goal: Improved Ability to Complete Activities of Daily Living  Outcome: Ongoing - Unchanged     Problem: Asthma Comorbidity  Goal: Maintenance of Asthma Control  Outcome: Ongoing - Unchanged     Problem: COPD Comorbidity  Goal: Maintenance of COPD Symptom Control  Outcome: Ongoing - Unchanged     Problem: Diabetes Comorbidity  Goal: Blood Glucose Level Within Desired Range  Outcome: Ongoing - Unchanged     Problem: Heart Failure Comorbidity  Goal: Maintenance of Heart Failure Symptom Control  Outcome: Ongoing - Unchanged     Problem: Hypertension Comorbidity  Goal: Blood Pressure in Desired Range  Outcome: Ongoing - Unchanged     Problem: Obstructive Sleep Apnea Risk or Actual (Comorbidity Management)  Goal: Unobstructed Breathing During Sleep  Outcome: Ongoing - Unchanged     Problem: Pain Chronic (Persistent) (Comorbidity Management)  Goal: Acceptable Pain Control and Functional Ability  Outcome: Ongoing - Unchanged     Problem: Seizure Disorder Comorbidity  Goal: Maintenance of Seizure Control  Outcome: Ongoing - Unchanged

## 2020-07-05 LAB — BASIC METABOLIC PANEL
ANION GAP: 10 mmol/L (ref 5–14)
BLOOD UREA NITROGEN: 62 mg/dL — ABNORMAL HIGH (ref 9–23)
BUN / CREAT RATIO: 28
CALCIUM: 8.3 mg/dL — ABNORMAL LOW (ref 8.7–10.4)
CHLORIDE: 113 mmol/L — ABNORMAL HIGH (ref 98–107)
CO2: 21 mmol/L (ref 20.0–31.0)
CREATININE: 2.23 mg/dL — ABNORMAL HIGH
EGFR CKD-EPI AA FEMALE: 28 mL/min/{1.73_m2} — ABNORMAL LOW (ref >=60)
GLUCOSE RANDOM: 245 mg/dL — ABNORMAL HIGH (ref 70–179)
POTASSIUM: 4.8 mmol/L — ABNORMAL HIGH (ref 3.4–4.5)
SODIUM: 144 mmol/L (ref 135–145)

## 2020-07-05 LAB — BLOOD GAS CRITICAL CARE PANEL, ARTERIAL
BASE EXCESS ARTERIAL: -5 — ABNORMAL LOW (ref -2.0–2.0)
BASE EXCESS ARTERIAL: -5.9 — ABNORMAL LOW (ref -2.0–2.0)
CALCIUM IONIZED ARTERIAL (MG/DL): 4.61 mg/dL (ref 4.40–5.40)
CALCIUM IONIZED ARTERIAL (MG/DL): 4.81 mg/dL (ref 4.40–5.40)
CALCIUM IONIZED ARTERIAL (MG/DL): 4.88 mg/dL (ref 4.40–5.40)
FIO2 ARTERIAL: 70
FIO2 ARTERIAL: 70
GLUCOSE WHOLE BLOOD: 168 mg/dL (ref 70–179)
GLUCOSE WHOLE BLOOD: 175 mg/dL (ref 70–179)
GLUCOSE WHOLE BLOOD: 247 mg/dL — ABNORMAL HIGH (ref 70–179)
HCO3 ARTERIAL: 20 mmol/L — ABNORMAL LOW (ref 22–27)
HCO3 ARTERIAL: 21 mmol/L — ABNORMAL LOW (ref 22–27)
HEMOGLOBIN BLOOD GAS: 12.5 g/dL (ref 12.00–16.00)
HEMOGLOBIN BLOOD GAS: 11.9 g/dL — ABNORMAL LOW (ref 12.00–16.00)
HEMOGLOBIN BLOOD GAS: 11.4 g/dL — ABNORMAL LOW (ref 12.00–16.00)
LACTATE BLOOD ARTERIAL: 2.9 mmol/L — ABNORMAL HIGH (ref <1.3)
LACTATE BLOOD ARTERIAL: 3.3 mmol/L — ABNORMAL HIGH (ref <1.3)
LACTATE BLOOD ARTERIAL: 2.6 mmol/L — ABNORMAL HIGH (ref <1.3)
O2 SATURATION ARTERIAL: 95.9 % (ref 94.0–100.0)
O2 SATURATION ARTERIAL: 98.8 % (ref 94.0–100.0)
O2 SATURATION ARTERIAL: 97.3 % (ref 94.0–100.0)
PCO2 ARTERIAL: 43.3 mmHg (ref 35.0–45.0)
PCO2 ARTERIAL: 43.6 mmHg (ref 35.0–45.0)
PH ARTERIAL: 7.27 — ABNORMAL LOW (ref 7.35–7.45)
PH ARTERIAL: 7.32 — ABNORMAL LOW (ref 7.35–7.45)
PO2 ARTERIAL: 117 mmHg — ABNORMAL HIGH (ref 80.0–110.0)
PO2 ARTERIAL: 98.5 mmHg (ref 80.0–110.0)
POTASSIUM WHOLE BLOOD: 3.8 mmol/L (ref 3.4–4.6)
POTASSIUM WHOLE BLOOD: 4.2 mmol/L (ref 3.4–4.6)
POTASSIUM WHOLE BLOOD: 4.7 mmol/L — ABNORMAL HIGH (ref 3.4–4.6)
SODIUM WHOLE BLOOD: 147 mmol/L — ABNORMAL HIGH (ref 135–145)
SODIUM WHOLE BLOOD: 146 mmol/L — ABNORMAL HIGH (ref 135–145)
SODIUM WHOLE BLOOD: 143 mmol/L (ref 135–145)

## 2020-07-05 LAB — COMPREHENSIVE METABOLIC PANEL
ALBUMIN: 2.4 g/dL — ABNORMAL LOW (ref 3.4–5.0)
ALBUMIN: 2 g/dL — ABNORMAL LOW (ref 3.4–5.0)
ALKALINE PHOSPHATASE: 48 U/L (ref 46–116)
ALKALINE PHOSPHATASE: 47 U/L (ref 46–116)
ALT (SGPT): 32 U/L (ref 10–49)
ANION GAP: 11 mmol/L (ref 5–14)
ANION GAP: 9 mmol/L (ref 5–14)
AST (SGOT): 23 U/L (ref <=34)
AST (SGOT): 51 U/L — ABNORMAL HIGH (ref <=34)
BILIRUBIN TOTAL: 0.3 mg/dL (ref 0.3–1.2)
BILIRUBIN TOTAL: 0.3 mg/dL (ref 0.3–1.2)
BLOOD UREA NITROGEN: 45 mg/dL — ABNORMAL HIGH (ref 9–23)
BLOOD UREA NITROGEN: 64 mg/dL — ABNORMAL HIGH (ref 9–23)
BUN / CREAT RATIO: 33
BUN / CREAT RATIO: 28
CALCIUM: 8.4 mg/dL — ABNORMAL LOW (ref 8.7–10.4)
CALCIUM: 8.3 mg/dL — ABNORMAL LOW (ref 8.7–10.4)
CHLORIDE: 113 mmol/L — ABNORMAL HIGH (ref 98–107)
CO2: 20 mmol/L (ref 20.0–31.0)
CO2: 22 mmol/L (ref 20.0–31.0)
CREATININE: 1.38 mg/dL — ABNORMAL HIGH
CREATININE: 2.3 mg/dL — ABNORMAL HIGH
EGFR CKD-EPI AA FEMALE: 50 mL/min/{1.73_m2} — ABNORMAL LOW (ref >=60)
EGFR CKD-EPI AA FEMALE: 27 mL/min/{1.73_m2} — ABNORMAL LOW (ref >=60)
EGFR CKD-EPI NON-AA FEMALE: 43 mL/min/{1.73_m2} — ABNORMAL LOW (ref >=60)
EGFR CKD-EPI NON-AA FEMALE: 23 mL/min/{1.73_m2} — ABNORMAL LOW (ref >=60)
GLUCOSE RANDOM: 175 mg/dL (ref 70–179)
POTASSIUM: 3.7 mmol/L (ref 3.4–4.5)
POTASSIUM: 4.8 mmol/L — ABNORMAL HIGH (ref 3.4–4.5)
PROTEIN TOTAL: 5.9 g/dL (ref 5.7–8.2)
PROTEIN TOTAL: 5.7 g/dL (ref 5.7–8.2)
SODIUM: 145 mmol/L (ref 135–145)
SODIUM: 144 mmol/L (ref 135–145)

## 2020-07-05 LAB — CBC
HEMOGLOBIN: 13 g/dL (ref 12.0–16.0)
MEAN CORPUSCULAR HEMOGLOBIN CONC: 31.6 g/dL (ref 31.0–37.0)
MEAN CORPUSCULAR HEMOGLOBIN: 28.7 pg (ref 26.0–34.0)
MEAN CORPUSCULAR VOLUME: 90.8 fL (ref 80.0–100.0)
MEAN PLATELET VOLUME: 8.4 fL (ref 7.0–10.0)
PLATELET COUNT: 410 10*9/L (ref 150–440)
RED BLOOD CELL COUNT: 4.53 10*12/L (ref 4.00–5.20)
RED CELL DISTRIBUTION WIDTH: 14.5 % (ref 12.0–15.0)
WBC ADJUSTED: 12.9 10*9/L — ABNORMAL HIGH (ref 4.5–11.0)

## 2020-07-05 LAB — BLOOD GAS, ARTERIAL
BASE EXCESS ARTERIAL: -5.9 — ABNORMAL LOW (ref -2.0–2.0)
BASE EXCESS ARTERIAL: -6.9 — ABNORMAL LOW (ref -2.0–2.0)
BASE EXCESS ARTERIAL: -5.8 — ABNORMAL LOW (ref -2.0–2.0)
FIO2 ARTERIAL: 100
FIO2 ARTERIAL: 100
FIO2 ARTERIAL: 100
HCO3 ARTERIAL: 18 mmol/L — ABNORMAL LOW (ref 22–27)
HCO3 ARTERIAL: 20 mmol/L — ABNORMAL LOW (ref 22–27)
O2 SATURATION ARTERIAL: 96.5 % (ref 94.0–100.0)
O2 SATURATION ARTERIAL: 96.9 % (ref 94.0–100.0)
PCO2 ARTERIAL: 35.8 mmHg (ref 35.0–45.0)
PCO2 ARTERIAL: 41.8 mmHg (ref 35.0–45.0)
PH ARTERIAL: 7.28 — ABNORMAL LOW (ref 7.35–7.45)
PH ARTERIAL: 7.32 — ABNORMAL LOW (ref 7.35–7.45)
PH ARTERIAL: 7.29 — ABNORMAL LOW (ref 7.35–7.45)
PO2 ARTERIAL: 156 mmHg — ABNORMAL HIGH (ref 80.0–110.0)

## 2020-07-05 LAB — MAGNESIUM: Magnesium:MCnc:Pt:Ser/Plas:Qn:: 2.1

## 2020-07-05 LAB — PHOSPHORUS: Phosphate:MCnc:Pt:Ser/Plas:Qn:: 2.9

## 2020-07-05 LAB — TACROLIMUS, TROUGH: Lab: 3.5 — ABNORMAL LOW

## 2020-07-05 LAB — SODIUM URINE: Lab: 10

## 2020-07-05 LAB — ALBUMIN: Albumin:MCnc:Pt:Ser/Plas:Qn:: 2.4 — ABNORMAL LOW

## 2020-07-05 LAB — SPECIMEN SOURCE

## 2020-07-05 LAB — CO2: Carbon dioxide:SCnc:Pt:Ser/Plas:Qn:: 21

## 2020-07-05 LAB — D-DIMER QUANTITATIVE (CW,ML,HL): Lab: 3897 — ABNORMAL HIGH

## 2020-07-05 LAB — PCO2 ARTERIAL: Carbon dioxide:PPres:Pt:BldA:Qn:: 43

## 2020-07-05 LAB — HCO3 ARTERIAL: Bicarbonate:SCnc:Pt:BldA:Qn:: 20 — ABNORMAL LOW

## 2020-07-05 LAB — PH ARTERIAL
pH:LsCnc:Pt:BldA:Qn:: 7.27 — ABNORMAL LOW
pH:LsCnc:Pt:BldA:Qn:: 7.28 — ABNORMAL LOW

## 2020-07-05 LAB — BLOOD GAS, VENOUS
BASE EXCESS VENOUS: -5.3 — ABNORMAL LOW (ref -2.0–2.0)
HCO3 VENOUS: 21 mmol/L — ABNORMAL LOW (ref 22–27)
O2 SATURATION VENOUS: 75.4 % (ref 40.0–85.0)
PCO2 VENOUS: 48 mmHg (ref 40–60)
PH VENOUS: 7.26 — ABNORMAL LOW (ref 7.32–7.43)

## 2020-07-05 LAB — SODIUM: Sodium:SCnc:Pt:Ser/Plas:Qn:: 144

## 2020-07-05 LAB — CREATININE, URINE: Lab: 269.3

## 2020-07-05 LAB — VANCOMYCIN RANDOM: Vancomycin^random:MCnc:Pt:Ser/Plas:Qn:: 14.2

## 2020-07-05 LAB — LACTATE BLOOD VENOUS: Lactate:SCnc:Pt:BldV:Qn:: 3 — ABNORMAL HIGH

## 2020-07-05 LAB — HEMATOCRIT: Hematocrit:VFr:Pt:Bld:Qn:: 41.2

## 2020-07-05 LAB — LACTATE BLOOD ARTERIAL: Lactate:SCnc:Pt:BldA:Qn:: 3.3 — ABNORMAL HIGH

## 2020-07-05 MED ADMIN — polyethylene glycol (MIRALAX) packet 17 g: 17 g | ORAL | @ 12:00:00

## 2020-07-05 MED ADMIN — midazolam (VERSED) injection 2 mg: 2 mg | INTRAVENOUS | @ 05:00:00 | Stop: 2020-07-05

## 2020-07-05 MED ADMIN — enoxaparin (LOVENOX) syringe 40 mg: 40 mg | SUBCUTANEOUS | @ 07:00:00 | Stop: 2020-07-05

## 2020-07-05 MED ADMIN — midazolam in sodium chloride 0.9% (1 mg/mL) infusion PMB: 0-6 mg/h | INTRAVENOUS | @ 17:00:00

## 2020-07-05 MED ADMIN — vecuronium (NORCURON) injection 10 mg: 10 mg | INTRAVENOUS | @ 05:00:00 | Stop: 2020-07-05

## 2020-07-05 MED ADMIN — enoxaparin (LOVENOX) syringe 50 mg: .5 mg/kg | SUBCUTANEOUS | @ 22:00:00

## 2020-07-05 MED ADMIN — midazolam (PF) (VERSED) 1 mg/mL injection: INTRAVENOUS | @ 05:00:00 | Stop: 2020-07-05

## 2020-07-05 MED ADMIN — LORazepam (ATIVAN) tablet 2 mg: 2 mg | ORAL | @ 01:00:00

## 2020-07-05 MED ADMIN — midazolam (VERSED) injection 2 mg: 2 mg | INTRAVENOUS | @ 21:00:00

## 2020-07-05 MED ADMIN — potassium chloride (KLOR-CON) packet 20 mEq: 20 meq | ORAL | @ 17:00:00 | Stop: 2020-07-05

## 2020-07-05 MED ADMIN — midazolam (VERSED) injection 2 mg: 2 mg | INTRAVENOUS | @ 02:00:00

## 2020-07-05 MED ADMIN — HYDROmorphone (PF) (DILAUDID) injection 4 mg: 4 mg | INTRAVENOUS | @ 08:00:00 | Stop: 2020-07-18

## 2020-07-05 MED ADMIN — LORazepam (ATIVAN) tablet 2 mg: 2 mg | ORAL | @ 07:00:00 | Stop: 2020-07-05

## 2020-07-05 MED ADMIN — HYDROmorphone (PF) (DILAUDID) injection 4 mg: 4 mg | INTRAVENOUS | @ 19:00:00 | Stop: 2020-07-18

## 2020-07-05 MED ADMIN — oxyCODONE (ROXICODONE) immediate release tablet 20 mg: 20 mg | ORAL | @ 12:00:00 | Stop: 2020-07-05

## 2020-07-05 MED ADMIN — midazolam (VERSED) injection 2 mg: 2 mg | INTRAVENOUS | @ 05:00:00

## 2020-07-05 MED ADMIN — senna (SENOKOT) tablet 2 tablet: 2 | ORAL | @ 12:00:00

## 2020-07-05 MED ADMIN — chlorhexidine (PERIDEX) 0.12 % solution 5 mL: 5 mL | OROMUCOSAL | @ 12:00:00

## 2020-07-05 MED ADMIN — LORazepam (ATIVAN) tablet 4 mg: 4 mg | ORAL | @ 21:00:00

## 2020-07-05 MED ADMIN — aspirin chewable tablet 81 mg: 81 mg | ORAL | @ 12:00:00

## 2020-07-05 MED ADMIN — levothyroxine (SYNTHROID) tablet 100 mcg: 100 ug | GASTROENTERAL | @ 09:00:00

## 2020-07-05 MED ADMIN — insulin regular (HumuLIN,NovoLIN) injection 0-12 Units: 0-12 [IU] | SUBCUTANEOUS | @ 23:00:00

## 2020-07-05 MED ADMIN — oxyCODONE (ROXICODONE) immediate release tablet 30 mg: 30 mg | ORAL | @ 17:00:00 | Stop: 2020-07-17

## 2020-07-05 MED ADMIN — HYDROmorphone (PF) (DILAUDID) injection 4 mg: 4 mg | INTRAVENOUS | @ 03:00:00 | Stop: 2020-07-04

## 2020-07-05 MED ADMIN — oxyCODONE (ROXICODONE) immediate release tablet 20 mg: 20 mg | ORAL | @ 03:00:00 | Stop: 2020-07-17

## 2020-07-05 MED ADMIN — cefepime (MAXIPIME) 2 g in dextrose 100 mL IVPB (premix): 2 g | INTRAVENOUS | @ 04:00:00 | Stop: 2020-07-09

## 2020-07-05 MED ADMIN — chlorhexidine (PERIDEX) 0.12 % solution 5 mL: 5 mL | OROMUCOSAL

## 2020-07-05 MED ADMIN — lactated ringers bolus 1,000 mL: 1000 mL | INTRAVENOUS | @ 10:00:00 | Stop: 2020-07-05

## 2020-07-05 MED ADMIN — HYDROmorphone (PF) (DILAUDID) injection 4 mg: 4 mg | INTRAVENOUS | @ 13:00:00 | Stop: 2020-07-18

## 2020-07-05 MED ADMIN — LORazepam (ATIVAN) tablet 4 mg: 4 mg | ORAL | @ 17:00:00

## 2020-07-05 MED ADMIN — HYDROmorphone 1 mg/mL in 0.9% sodium chloride: 0-10 mg/h | INTRAVENOUS | @ 12:00:00 | Stop: 2020-07-16

## 2020-07-05 MED ADMIN — LORazepam (ATIVAN) tablet 2 mg: 2 mg | ORAL | @ 12:00:00 | Stop: 2020-07-05

## 2020-07-05 MED ADMIN — polyethylene glycol (MIRALAX) packet 17 g: 17 g | ORAL | @ 01:00:00

## 2020-07-05 MED ADMIN — insulin regular (HumuLIN,NovoLIN) injection 0-12 Units: 0-12 [IU] | SUBCUTANEOUS | @ 17:00:00

## 2020-07-05 MED ADMIN — insulin lispro (HumaLOG) injection 0-12 Units: 0-12 [IU] | SUBCUTANEOUS | @ 01:00:00

## 2020-07-05 MED ADMIN — oxyCODONE (ROXICODONE) immediate release tablet 20 mg: 20 mg | ORAL | @ 01:00:00 | Stop: 2020-07-17

## 2020-07-05 MED ADMIN — pravastatin (PRAVACHOL) tablet 40 mg: 40 mg | ORAL | @ 12:00:00

## 2020-07-05 MED ADMIN — lactated ringers bolus 500 mL: 500 mL | INTRAVENOUS | @ 22:00:00 | Stop: 2020-07-05

## 2020-07-05 MED ADMIN — HYDROmorphone 1 mg/mL in 0.9% sodium chloride: 0-10 mg/h | INTRAVENOUS | @ 18:00:00 | Stop: 2020-07-16

## 2020-07-05 MED ADMIN — insulin lispro (HumaLOG) injection 0-12 Units: 0-12 [IU] | SUBCUTANEOUS | @ 11:00:00 | Stop: 2020-07-05

## 2020-07-05 MED ADMIN — midazolam (VERSED) injection 2 mg: 2 mg | INTRAVENOUS | @ 09:00:00

## 2020-07-05 MED ADMIN — metoclopramide (REGLAN) injection 5 mg: 5 mg | INTRAVENOUS | @ 22:00:00 | Stop: 2020-07-06

## 2020-07-05 MED ADMIN — oxyCODONE (ROXICODONE) immediate release tablet 20 mg: 20 mg | ORAL | @ 07:00:00 | Stop: 2020-07-05

## 2020-07-05 NOTE — Unmapped (Signed)
Tacrolimus Therapeutic Monitoring Pharmacy Note    Evelyn Rollins is a 54 y.o. female continuing tacrolimus.     Indication: Liver transplant     Date of Transplant: 2016      Prior Dosing Information: Home regimen  tacrolimus 2mg  am and 1mg  pm     Goals:  Therapeutic Drug Levels  Tacrolimus trough goal: 3-5 ng/mL per Consult note 07/03/20    Additional Clinical Monitoring/Outcomes  ?? Monitor renal function (SCr and urine output) and liver function (LFTs)  ?? Monitor for signs/symptoms of adverse events (e.g., hyperglycemia, hyperkalemia, hypomagnesemia, hypertension, headache, tremor)    Results:   Tacrolimus level:  tacro =   3.5ng/mL at 0653  drawn ~ 10h after last dose (7/24 ~2041)    Pharmacokinetic Considerations and Significant Drug Interactions:  ??? Concurrent hepatotoxic medications: none identified at time of note  ??? Concurrent CYP3A4 substrates/inhibitors: none identified at time of note  ??? Concurrent nephrotoxic medications: none identified at time of note    Assessment/Plan:  UNLESS INDICATED OTHERWISE BY TRANSPLANT TEAM    ASSESSMENT:  7/23 evening tacrolimus changed to susp via NG given pt intubated at this time.    7/25 tacro level is within goal range' albeit drawn ~ 1.25h  Early (true trough closer to ~3ng/mL)    Recommendation(s)  ??? Continue tacro SUSP 2 mg QAM and 1 mg QPM, slightly uptrending from yesterday  ??? Restart Myfortic after acute illness per Consult team    Follow-up  ??? Daily with AM labs (between 0600-0800, within 1h preferred) PRIOR to morning dose .   ??? A pharmacist will continue to monitor and recommend levels as appropriate    Longitudinal Dose Monitoring:  Date Dose (mg), Route AM Scr (mg/dL) Level  (ng/mL) Key Drug Interactions                        07/25 2mg  susp at 0754  1mg  pending) 1.38 3.5 at 0653    07/24 2mg  SUSP at 0844  (1mg  susp at 2041 1.52 2.3    07/23 1mg  SUSP 2026 1.3 1.8 at 0859 -prior to adm         Please page service pharmacist with questions/clarifications. Drue Flirt, PharmD   July 05, 2020 3:37 PM

## 2020-07-05 NOTE — Unmapped (Signed)
Brief Inpatient Follow Up Consult Note         Recommendations          Evelyn Rollins is a 54 y.o. female with PMH of cirrhosis s/p liver transplant in 2016 on chronic immunosuppression (tacrolimus and myfortic), HTN, diabetes, and hypothyroidism on whom I have been asked by [name], * to consult for possible seizure activity.  ??  Seizure-like activity - new RUE weakness: Pt presented with episodes of seizure like activity. Events captured on EEG without epileptiform correlate. By history alone, particularly the 2nd event which had bilateral involvement but maintained consciousness. Patient had a desaturation resulting in Pronationand versed pushes overnight. There was no epileptiform correlate with the overnight event either. Her abnormal movements are not consistent with epileptic activity but rather a symptomatic myoclonus . Reduced activation on RUE was noted on 7/24 neurological exam. Given her risk for vascular event as well as the concern for PRES 2/2 Tacrolimus toxicity, additional imaging is recommended as below.   ??  RECOMMENDATIONS:  1. Discontinue EEG  2. Versed wean as able from a respiratory standpoint  3. MRI Brain wwo Contrast preferably, but can consider CTH given change in Neuro exam       This patient was discussed with Dr. Seward Meth, who agrees to the assessment and plan.   Please page the neurology consult pager [phone]) with any questions. We will continue to follow along with interest.    Lyla Son, MD  [facility] Department of Neurology - PGY-3

## 2020-07-05 NOTE — Unmapped (Addendum)
Vancomycin Therapeutic Monitoring Pharmacy Note    Erva Koke is a 54 y.o. female starting vancomycin. Date of therapy initiation: 07/04/20    Indication: Pneumonia    Prior Dosing Information: None/new initiation     Goals:  Therapeutic Drug Levels  Vancomycin trough goal: 15-20 mg/L    Additional Clinical Monitoring/Outcomes  Renal function, volume status (intake and output)    Results: Not applicable    Wt Readings from Last 1 Encounters:   07/08/2020 (!) 104.8 kg (231 lb)     Creatinine   Date Value Ref Range Status   07/04/2020 1.52 (H) 0.60 - 0.80 mg/dL Final   16/09/9603 5.40 (H) 0.60 - 0.80 mg/dL Final   98/10/9146 8.29 (H) 0.60 - 0.80 mg/dL Final        Pharmacokinetic Considerations and Significant Drug Interactions:  ??? Adult (estimated initial): Vd = 74.408 L, ke = 0.0443 hr-1  ??? Concurrent nephrotoxic meds: tacrolimus    Assessment/Plan:  Recommendation(s)  ??? Start vancomycin 1750mg  q24hr  ??? Estimated trough on recommended regimen: 13 mg/L    Follow-up  ??? Level due: prior to fourth or fifth dose  ??? A pharmacist will continue to monitor and order levels as appropriate    Please page service pharmacist with questions/clarifications.    Cephus Slater, PharmD

## 2020-07-05 NOTE — Unmapped (Signed)
MICU Daily Progress Note     Date of Service: 07/05/2020    Problem List:   Active Problems:    Liver transplant recipient (CMS-HCC)    Morbid obesity with BMI of 40.0-44.9, adult (CMS-HCC)    Acute hypoxemic respiratory failure due to severe acute respiratory syndrome coronavirus 2 (SARS-CoV-2) disease (CMS-HCC)  Resolved Problems:    * No resolved hospital problems. *      Interval history:  Evelyn Rollins is a 54 y.o. female with PMH cirrhosis status post liver transplant 2016 on chronic immunosuppressive therapy, hypertension, diabetes, hypothyroidism who presented to [facility] on 7/22 with myalgias, sore throat, loss of taste and fevers  x5 days. When her symptoms had started, she took a home Covid test on 7/17 which was found to be positive. She has had poor p.o. intake for the last several days. Her husband also has similar symptoms. In the OSH ED she was found to be severely hypoxic with O2 saturations of 50% on room air. She was placed on a nonrebreather and current saturations increased to the low 90s. Chest x-ray shows bilateral infiltrates consistent with COVID-19 pneumonia. Her Covid test is positive. Inflammatory markers are elevated. She received a dose of Decadron in the emergency room.    24hr events:   - desat w vent dys sync req inc sedation and vec bolus  - c/f sz like activity  - febrile    Neurological   Analgesia and Sedation  -dilaudid and versed gtt >> plan to wean dilaudid first as tol  - goal RASS 3- to 4-  -PRN dilaudid and versed  -ativan 2q4h, oxy 20q4h >> inc to 4q4 and 30 q4  - plan for vec bolus for MRI  ??  Anxiety, depression  -holding home meds abilify, wellbutrin and lexapro; ambien and ativan prn  - cont to hold lexapro and wellbutrin d/t c/f and risk of serotonin syndrome while on reglan  ??  Seizure history (not taking AEDs), ?seizure activity  -neurology consult >> c/f RUE weakness, rec MRI or CT when stable enough to transport  -vEEG negative for seizure x 48hrs >> discont monitoring per neuro 7/25      Pulmonary   #ARDS, Acute resp failure r/t COVID  - tolerating PRVC well  - cont lung protective ventilation strategies (34ml/kg, permissive hypercapnea)  - cont wean vent as tol per ARDS protocol  - sedation for vent compliance as above w lacrilube OU bid  - goal sats >92%, pao2 >60 pco2, 45-60 , pH>7.25  - pulm toilet as tol, HOB>30deg  - peridex for vap ppx   - prone since 0100 7/25 >> f/u cxr when supinated      Cardiovascular   h/o HTN   -holding home antihtn  - previously on levo for MAP >65  - weaned off levo since 7/24  - last echo 2016 w nml LVEF, enlrgd RV and deprssed RV fxn w mild phtn  - consider formal echo when supine    Renal   AKI  - oliguric >> fluid bolus, f/u urine lytes and bmp  - cont foley for accurate I/O during critical illness   - goal euvolemia   - replete electrolytes prn and monitor daily      Infectious Disease/Autoimmune   #COVID 19 infection  Symptom onset:??7/17  Exposure: unknown  COVID PCR+: 7/17  OSH Admission:??7/22  Day of admission/transfer: 7/22  OSH Meds Given:??dexamethasone  COVID Specific??Meds:??dexamethasone  Vaccination status: not vaccinated  ??  Monitoring:  -  Special airborne/contact precautions (If unavailable, droplet & contact precautions)  - f/u daily CMP, CBC w/ diff, CRP, D-dimer, DIC, troponin, pro-BNP with weekly ferritin, LDH and cytokine level  - f/u q6h lactate; ABG  ??  Medications:  - completed remdesivir  - cont decadron 6mg  IV daily x 10d, started 7/22  ??  #CAP/VAP  - febrile, leukocytosis   - started cefep/vanc 7/25  - pan cx for T>38.2   - f/u cx data     Cultures:  Blood Culture, Routine (no units)   Date Value   07/04/2020 No Growth at 24 hours   07/04/2020 Positive Culture, Results to Follow (A)     Lower Respiratory Culture (no units)   Date Value   07/04/2020 CULTURE IN PROGRESS.     WBC (10*9/L)   Date Value   07/05/2020 12.9 (H)     WBC, UA (/HPF)   Date Value   07/04/2020 3          FEN/GI   -trickle tube feeds >> intmt high residuals  - started reglan x 24hrs  -bowel regimen w senna and miralax  -PPI  - cont pravachol per home regimen  ??    Malnutrition Assessment: Not done yet.     Malnutrition Assessment: Not done yet.  #Immunocompromised s/p Liver transplant in 2016  -continue tacrolimus  -HOLD cellcept    Heme/Coag   Hypercoagulable state 2/2 covid  -anticoagulation group C, lovenox  -LE dopplers neg for DVT 7/24  - no evid bleeding    Endocrine   History of DM2  -ISS  ??    Integumentary     #  - WOCN consulted for high risk skin assessment Yes.  - WOCN recs >> pending, ordered 7/25   - cont pressure mitigating precautions per skin policy    Prophylaxis/LDA/Restraints/Consults   Can CVC be removed? No: need for medications requiring central access (e.g. pressors)   Can A-line be removed? No: frequent ABGs  Can Foley be removed? No: Need continuous I/O  Mobility plan: Step 1 - Range of motion    Feeding: Trickle feeds, advance as tolerated  Analgesia: No pain issues  Sedation SAT/SBT: No PEEP > 8  Thromboembolic ppx: Enoxaparin  Head of bed >30 degrees: Yes  Ulcer ppx: Yes, coagulopathy  Glucose within target range: Yes, in range    Does patient need/have an active type/screen? NA    RASS at goal? Yes  [place] Agitation Assessment Scale (RASS) : -5 (07/05/2020  6:00 AM)     Can antipsychotics be stopped? N/A, not on antipsychotics  CAM-ICU Result: Positive (07/05/2020 12:00 AM)      Would hospice care be appropriate for this patient? No, patient improving or expected to improve    Patient Lines/Drains/Airways Status    Active Active Lines, Drains, & Airways     Name:   Placement date:   Placement time:   Site:   Days:    ETT  7.5   08-01-20    1700     2    CVC Triple Lumen August 01, 2020 Non-tunneled Right Internal jugular   08-01-20    1945    Internal jugular   2    NG/OG Tube Decompression;Feedings Center mouth   07/03/20    0610    Center mouth   2    Urethral Catheter   08/01/2020    ???    ???   3    Peripheral IV 08-01-2020 Left Antecubital  07-18-2020    1300    Antecubital   2    Arterial Line 2020/07/18 Right Radial   07-18-2020    2315    Radial   2              Patient Lines/Drains/Airways Status    Active Wounds     None                Goals of Care     Code Status: Full Code    Designated Healthcare Decision Maker:  Ms. Christon current decisional capacity for healthcare decision-making is incapacitated. Her designated Educational psychologist) is/are her husband .      Subjective     Intubated, sedated    Objective     Vitals - past 24 hours  Temp:  [38.9 ??C (102 ??F)] 38.9 ??C (102 ??F)  Heart Rate:  [62-89] 83  SpO2 Pulse:  [62-89] 84  Resp:  [20-39] 26  FiO2 (%):  [50 %-100 %] 60 %  SpO2:  [84 %-99 %] 95 % Intake/Output  I/O last 3 completed shifts:  In: 1645 [I.V.:490; NG/GT:860; IV Piggyback:295]  Out: 1600 [Urine:1600]     ??  Physical Exam:   General: NAD, well nourished, obese, intubated, sedated   HEENT:  normocephalic, trachea mdl, anicteric, no scleral edema, no conjuctival erythema/drainage, MMM and pink   CV: RRR. S1 and S2 normal, no m/r/c/g. no bruit, or JVD. DP Pulses 2  equal. 1+ BLE edema.  Lungs:   chest rise symm, diminished bases. CTA bilat, no wheezes/crackles/rhonchi. Good air movement.  Skin:   w/d/i, no jaundice present, no rashes, lesions, petechiae or breakdown. no drng, erythema.    Abd: contour, abdomen soft, non-tender and not distended. Normoactive bowel sounds x 4 quads, no rebound tenderness or guarding.   Ext: No cyanosis, bruising, clubbing or edema.   Neuro: perrl 3Sl, mdl, orbital edema, +c/c/g, intmt truncal 2-3 beat clonus w stim (no assoc w sz on eeg),       Continuous Infusions:   ??? HYDROmorphone 8 mg/hr (07/05/20 0750)   ??? midazolam (1 mg/mL) infusion 5 mg/hr (07/05/20 0600)   ??? norepinephrine bitartrate-NS Stopped (07/04/20 0835)       Scheduled Medications:   ??? aspirin  81 mg Oral Daily   ??? Cefepime  2 g Intravenous Q12H   ??? chlorhexidine  5 mL Mouth BID   ??? dexamethasone  6 mg Intravenous Q24H   ??? enoxaparin (LOVENOX) injection  0.5 mg/kg Subcutaneous Q12H   ??? insulin regular  0-12 Units Subcutaneous Q6H [facility]   ??? levothyroxine  100 mcg Enteral tube: gastric  daily   ??? LORazepam  4 mg Oral Q4H   ??? metoclopramide  5 mg Intravenous Q6H [facility]   ??? oxyCODONE  30 mg Oral Q4H   ??? polyethylen glycol  17 g Oral TID   ??? potassium chloride  20 mEq Oral Once   ??? pravastatin  40 mg Oral Daily   ??? senna  2 tablet Oral BID   ??? Tacrolimus  2 mg Enteral tube: gastric  Daily   ??? Tacrolimus  1 mg Enteral tube: gastric  Nightly (2000)   ??? vancomycin  1,750 mg Intravenous Q24H       PRN medications:  dextrose 50 % in water (D50W), HYDROmorphone, midazolam    Data/Imaging Review: Reviewed in Epic and personally interpreted on 07/05/2020. See EMR for detailed results.      Critical Care Attestation  This patient is critically ill or injured with the impairment of vital organ systems such that there is a high probability of imminent or life threatening deterioration in the patient's condition. This patient must remain in the ICU for ongoing evaluation of the comprehensive management plan outlined in this note. I directly provided critical care services as documented in this note and the critical care time spent (55 min) is exclusive of separately billable procedures.    Olina Melfi Fonnie Mu, ACNP

## 2020-07-05 NOTE — Unmapped (Signed)
Pt required being place in prone position overnight, ETT secure and patent. PT unable to tolerate PRVC, vent dyssynchrony. Improvement on PCV, NAD noted at this time.

## 2020-07-05 NOTE — Unmapped (Signed)
DAILY REAL TIME INTERIM LONGTERM VIDEO EEG MONITORING NOTE      REPORT NOT YET FINAL - Please see final report under PROCEDURE tab in CHART REVIEW when study completed      HISTORY   Per chart, Evelyn Rollins is 54 y.o. years old female with evaluation of seizure or seizure like activity.    PROCEDURE  Continuous video-EEG was performed while awake utilizing 21 active electrodes placed according to the international 10-20 system.  The study was recorded digitally with a bandpass of 1-70Hz  and a sampling rate of 200Hz  and was reviewed with the possibility of multiple reformatting. The study was digitally processed with potential spike and seizure events identified for physician analysis and review.  Patient recognized events were identified by a push button marker and reviewed by the physician.  Background activity was reviewed from the continuously recorded data. Simultaneous video was reviewed for all patient events.      TECHNICAL DESCRIPTION:    Day 1 (07/03/20 @21 :15 - 07/04/20 @7 :00)    Stupor/Coma  No posterior dominant rhythm is observed and no bilateral beta activity. There is a 0.5-1Hz , 20-40uV, continuous generalized slow, which is polymorphic, reactive and variable, with overriding low-voltage delta/theta frequencies    Sleep  No N2 sleep structures observed    Events  None. No push button events    EKG recording shows a regular heart rhythm of 60-80 bpm.    Day 2 (07/04/20 @7 :00 - 07/05/20 @7 :00)  The EEG continued to show findings similar to the previous day???s recording.     Events    (7:14, 8:35, 12:12)  Clinical Classification: Paroxysmal Event (shaking)  Clinical Description: Patient is lying in bed when he abruptly has low-amplitude/high-frequency shaking of the entire body lasting a few seconds. No other clinical symptoms were observed. Similar clinical symptoms are seen with subsequent events (8:35, 12:12), both occurring with stimulation by medical staff.  EEG Classification: No EEG change  EEG Description: No EEG changes were seen    (22:16)  Clinical Classification: Paroxsymal Event (O2 desaturation)  Clinical Description: Push button was pressed for persistent O2 desaturation and brief intermittent body shaking as described above. Push button was pressed multiples times between (22:16-22:22)  EEG Classification: No EEG change  EEG Description: No EEG changes were observed    CLASSIFICATION:   - Continuous slow, generalized    Events  Clinical Classification: Paroxysmal Event (shaking)  EEG Classification: No EEG change    Clinical Classification: Paroxsymal Event (O2 desaturation)  EEG Classification: No EEG change    IMPRESSION   This day 2 of continuous EEG monitoring is consistent with a moderate encephalopathy. There were 3 push button events for brief body shaking which had no EEG correlate. In addition there were multiple push button events at 22:16 for O2 desaturation and brief body shaking which also had no associated EEG changes. There were no seizures or epileptiform activity.

## 2020-07-05 NOTE — Unmapped (Signed)
Sedated on versed and dilaudid gtt, which were up-titrated for increase in sedation and vent dsync. PRN versed and dilaudid given d/t vent dsynchrony and oxygen desaturation. Possible seizure activity witnessed, MD on call notified( see MD note) . Vecuronium IV push given before been proned. NSR with PVCs, pressor off. Vent settings changed d/t O2 saturation and ABG results( see flow sheet and RT note for more information). Tube feed held due to proning, and restarted after proning. No BM at time of this documentation. See flow sheet for Is and Os. Will continue to monitor.    Problem: Adult Inpatient Plan of Care  Goal: Plan of Care Review  Outcome: Ongoing - Unchanged  Goal: Patient-Specific Goal (Individualization)  Outcome: Ongoing - Unchanged  Goal: Absence of Hospital-Acquired Illness or Injury  Outcome: Ongoing - Unchanged  Goal: Optimal Comfort and Wellbeing  Outcome: Ongoing - Unchanged  Goal: Readiness for Transition of Care  Outcome: Ongoing - Unchanged  Goal: Rounds/Family Conference  Outcome: Ongoing - Unchanged     Problem: Skin Injury Risk Increased  Goal: Skin Health and Integrity  Outcome: Ongoing - Unchanged     Problem: Non-Violent Restraints  Goal: Patient will remain free of restraint events  Outcome: Ongoing - Unchanged  Goal: Patient will remain free of physical injury  Outcome: Ongoing - Unchanged     Problem: Infection  Goal: Infection Symptom Resolution  Outcome: Ongoing - Unchanged     Problem: Fall Injury Risk  Goal: Absence of Fall and Fall-Related Injury  Outcome: Ongoing - Unchanged     Problem: Wound  Goal: Optimal Wound Healing  Outcome: Ongoing - Unchanged     Problem: Communication Impairment (Mechanical Ventilation, Invasive)  Goal: Effective Communication  Outcome: Ongoing - Unchanged     Problem: Device-Related Complication Risk (Mechanical Ventilation, Invasive)  Goal: Optimal Device Function  Outcome: Ongoing - Unchanged     Problem: Inability to Wean (Mechanical Ventilation, Invasive)  Goal: Mechanical Ventilation Liberation  Outcome: Ongoing - Unchanged     Problem: Skin and Tissue Injury (Mechanical Ventilation, Invasive)  Goal: Absence of Device-Related Skin and Tissue Injury  Outcome: Ongoing - Unchanged     Problem: Ventilator-Induced Lung Injury (Mechanical Ventilation, Invasive)  Goal: Absence of Ventilator-Induced Lung Injury  Outcome: Ongoing - Unchanged     Problem: Self-Care Deficit  Goal: Improved Ability to Complete Activities of Daily Living  Outcome: Ongoing - Unchanged     Problem: Asthma Comorbidity  Goal: Maintenance of Asthma Control  Outcome: Ongoing - Unchanged     Problem: COPD Comorbidity  Goal: Maintenance of COPD Symptom Control  Outcome: Ongoing - Unchanged     Problem: Diabetes Comorbidity  Goal: Blood Glucose Level Within Desired Range  Outcome: Ongoing - Unchanged     Problem: Heart Failure Comorbidity  Goal: Maintenance of Heart Failure Symptom Control  Outcome: Ongoing - Unchanged     Problem: Hypertension Comorbidity  Goal: Blood Pressure in Desired Range  Outcome: Ongoing - Unchanged     Problem: Obstructive Sleep Apnea Risk or Actual (Comorbidity Management)  Goal: Unobstructed Breathing During Sleep  Outcome: Ongoing - Unchanged     Problem: Pain Chronic (Persistent) (Comorbidity Management)  Goal: Acceptable Pain Control and Functional Ability  Outcome: Ongoing - Unchanged     Problem: Seizure Disorder Comorbidity  Goal: Maintenance of Seizure Control  Outcome: Ongoing - Unchanged     Problem: LTC COVID-19 Confirmed or Rule-Out  Goal: Patient/Resident will remain free of complications due to COVID-19  Description: 1. Review  and update the patient/resident's isolation status in the Isolation activity  2. Keep the patient/resident's door closed at all times and limit movement of the patient/resident outside of the room to medically essential purposes. If applicable, transfer patient/resident to a single-person room   3. Educate and reinforce infection prevention and control practices recommended by CDC  4. Frequently monitor for development of more severe symptoms   5. Use appropriate PPE when providing care for patient/resident  6. Reinforce no visitor policy and non-essential health care personnel policy, except for certain compassionate care situations  7. If worsening of symptoms occur, alert the nearest Hospital caring for confirmed COVID-19 patients and arrange for transfer with proper precautions including placing a facemask on the patient/resident during transfer  8. Communicate information about known or suspected case of COVID-19 to appropriate public health personnel  9. Avoid procedures that are likely to induce coughing (e.g., sputum induction, open suctioning of airways). If required, do so in an Airborne Infection Isolation Room. The health care provider in the room should wear an N95 or higher-level respirator, eye protection, gloves, and a gown. The number of HCP present during the procedure should be limited to only those essential for patient/resident care and procedure support. Visitors should not be present for the procedure. Clean and disinfect procedure room surfaces promptly  10. Update patient/resident and family/representatives as needed      Outcome: Ongoing - Unchanged

## 2020-07-05 NOTE — Unmapped (Signed)
MICU Nightshift Note     Date of Service: 07/05/2020    Active Problems:    Liver transplant recipient (CMS-HCC)    Morbid obesity with BMI of 40.0-44.9, adult (CMS-HCC)    Acute hypoxemic respiratory failure due to severe acute respiratory syndrome coronavirus 2 (SARS-CoV-2) disease (CMS-HCC)    Plan summary   Called by nursing overnight due to desaturation and seizure like activity. Nursing had already given Dilaudid and Versed PRN upon arrival. PT O2 sat 92% on 100% FiO2 although reported desaturation to mid 80s. At that time bedside exam without any seizure like activity, pupils equal and reactive, would not respond to voice, withdrew RLE to pain but otherwise would not withdrawal to nail bed pressure, negative babinski bilaterally. Pt remained dysynchronus with the ventilator despite up titrate of sedation and additional PRNs given. Based on blood gas and desaturation with any movement or distrurbance. Decision was made to prone patient. 10mg  Vecuronium and 2mg  IV versed given during process.     Neurology called given seizure like activity who reviewed EEG. EEG negative for any seizure like activity. Again requested MRI that day team service requested. Pt unstable at this time but MRI ordered so that upon stabilization can obtain.     Pt also with temp overnight to 39. Sputum culture with 4+ GPC and worsening CXR, specifically in RLL. Will initiate Vanc and Cefepime. UA NEG for infection. Blood cx with GPC. Lactic increasing, will give 1L LR in setting of fever and sepsis. If pt continues to have fever without obvious source or develops new neurological sx, may consider LP. At this time, favor obtaining MRI as soon as pt is stable from a respiratory status.     Evelyn Hardy, MD      Attending Attestation     I saw and examined the patient. I reviewed the Resident's note and agree with the documented findings and plan of care.     At the time of my evaluation, Evelyn Rollins is critically ill and requires critical care support. The reason she is critically ill and the nature of the treatment and management provided by the teaching physician (me) is as follows:    Acute overnight issues requiring management  Severe hypoxemic respiratory failure requiring proning and paralysis overnight  Possible startle clonus with EEG not showing true seizure  VAP requiring new ABX start    Evelyn Rollins requires high complexity decision making for assessment and support, frequent evaluation and titration of therapies, application of advanced monitoring technologies and extensive interpretation of multiple databases.     I independently spent 65 minutes in critical care time reviewing the history, examining the patient, evaluating the hemodynamic, laboratory, and radiographic data, developing a comprehensive management plan, and serially assessing the patient's response to our critical care interventions.     Harrel Carina, MD  Pulmonary/Critical Care Medicine  Pediatric Pulmonology

## 2020-07-06 LAB — COMPREHENSIVE METABOLIC PANEL
ALBUMIN: 1.9 g/dL — ABNORMAL LOW (ref 3.4–5.0)
ALBUMIN: 1.8 g/dL — ABNORMAL LOW (ref 3.4–5.0)
ALKALINE PHOSPHATASE: 46 U/L (ref 46–116)
ALKALINE PHOSPHATASE: 49 U/L (ref 46–116)
ALT (SGPT): 39 U/L (ref 10–49)
ALT (SGPT): 38 U/L (ref 10–49)
ANION GAP: 7 mmol/L (ref 5–14)
ANION GAP: 6 mmol/L (ref 5–14)
AST (SGOT): 64 U/L — ABNORMAL HIGH (ref <=34)
AST (SGOT): 51 U/L — ABNORMAL HIGH (ref <=34)
BILIRUBIN TOTAL: 0.2 mg/dL — ABNORMAL LOW (ref 0.3–1.2)
BILIRUBIN TOTAL: 0.2 mg/dL — ABNORMAL LOW (ref 0.3–1.2)
BLOOD UREA NITROGEN: 69 mg/dL — ABNORMAL HIGH (ref 9–23)
BLOOD UREA NITROGEN: 82 mg/dL — ABNORMAL HIGH (ref 9–23)
BUN / CREAT RATIO: 29
CALCIUM: 8.2 mg/dL — ABNORMAL LOW (ref 8.7–10.4)
CHLORIDE: 112 mmol/L — ABNORMAL HIGH (ref 98–107)
CHLORIDE: 111 mmol/L — ABNORMAL HIGH (ref 98–107)
CO2: 23 mmol/L (ref 20.0–31.0)
CO2: 22 mmol/L (ref 20.0–31.0)
CREATININE: 2.5 mg/dL — ABNORMAL HIGH
CREATININE: 2.83 mg/dL — ABNORMAL HIGH
EGFR CKD-EPI AA FEMALE: 24 mL/min/{1.73_m2} — ABNORMAL LOW (ref >=60)
EGFR CKD-EPI AA FEMALE: 21 mL/min/{1.73_m2} — ABNORMAL LOW (ref >=60)
EGFR CKD-EPI NON-AA FEMALE: 21 mL/min/{1.73_m2} — ABNORMAL LOW (ref >=60)
EGFR CKD-EPI NON-AA FEMALE: 18 mL/min/{1.73_m2} — ABNORMAL LOW (ref >=60)
GLUCOSE RANDOM: 267 mg/dL — ABNORMAL HIGH (ref 70–179)
POTASSIUM: 4.9 mmol/L — ABNORMAL HIGH (ref 3.4–4.5)
POTASSIUM: 4.8 mmol/L — ABNORMAL HIGH (ref 3.4–4.5)
PROTEIN TOTAL: 5.4 g/dL — ABNORMAL LOW (ref 5.7–8.2)
PROTEIN TOTAL: 5.2 g/dL — ABNORMAL LOW (ref 5.7–8.2)
SODIUM: 142 mmol/L (ref 135–145)
SODIUM: 139 mmol/L (ref 135–145)

## 2020-07-06 LAB — BLOOD GAS CRITICAL CARE PANEL, ARTERIAL
BASE EXCESS ARTERIAL: -4.4 — ABNORMAL LOW (ref -2.0–2.0)
BASE EXCESS ARTERIAL: -3.8 — ABNORMAL LOW (ref -2.0–2.0)
BASE EXCESS ARTERIAL: -5.4 — ABNORMAL LOW (ref -2.0–2.0)
CALCIUM IONIZED ARTERIAL (MG/DL): 5.1 mg/dL (ref 4.40–5.40)
CALCIUM IONIZED ARTERIAL (MG/DL): 4.78 mg/dL (ref 4.40–5.40)
CALCIUM IONIZED ARTERIAL (MG/DL): 5 mg/dL (ref 4.40–5.40)
GLUCOSE WHOLE BLOOD: 253 mg/dL — ABNORMAL HIGH (ref 70–179)
GLUCOSE WHOLE BLOOD: 248 mg/dL — ABNORMAL HIGH (ref 70–179)
GLUCOSE WHOLE BLOOD: 277 mg/dL — ABNORMAL HIGH (ref 70–179)
HCO3 ARTERIAL: 21 mmol/L — ABNORMAL LOW (ref 22–27)
HCO3 ARTERIAL: 21 mmol/L — ABNORMAL LOW (ref 22–27)
HEMOGLOBIN BLOOD GAS: 10 g/dL — ABNORMAL LOW (ref 12.00–16.00)
HEMOGLOBIN BLOOD GAS: 11 g/dL — ABNORMAL LOW (ref 12.00–16.00)
HEMOGLOBIN BLOOD GAS: 10.8 g/dL — ABNORMAL LOW (ref 12.00–16.00)
HEMOGLOBIN BLOOD GAS: 11.9 g/dL — ABNORMAL LOW (ref 12.00–16.00)
LACTATE BLOOD ARTERIAL: 1.8 mmol/L — ABNORMAL HIGH (ref <1.3)
LACTATE BLOOD ARTERIAL: 1.7 mmol/L — ABNORMAL HIGH (ref <1.3)
LACTATE BLOOD ARTERIAL: 1.3 mmol/L — ABNORMAL HIGH (ref <1.3)
LACTATE BLOOD ARTERIAL: 1.2 mmol/L (ref <1.3)
O2 SATURATION ARTERIAL: 96.7 % (ref 94.0–100.0)
O2 SATURATION ARTERIAL: 96.1 % (ref 94.0–100.0)
O2 SATURATION ARTERIAL: 98.5 % (ref 94.0–100.0)
PCO2 ARTERIAL: 47.1 mmHg — ABNORMAL HIGH (ref 35.0–45.0)
PCO2 ARTERIAL: 44.2 mmHg (ref 35.0–45.0)
PCO2 ARTERIAL: 46.7 mmHg — ABNORMAL HIGH (ref 35.0–45.0)
PCO2 ARTERIAL: 46.1 mmHg — ABNORMAL HIGH (ref 35.0–45.0)
PH ARTERIAL: 7.3 — ABNORMAL LOW (ref 7.35–7.45)
PH ARTERIAL: 7.28 — ABNORMAL LOW (ref 7.35–7.45)
PO2 ARTERIAL: 83.4 mmHg (ref 80.0–110.0)
PO2 ARTERIAL: 86.8 mmHg (ref 80.0–110.0)
PO2 ARTERIAL: 112 mmHg — ABNORMAL HIGH (ref 80.0–110.0)
POTASSIUM WHOLE BLOOD: 4.8 mmol/L — ABNORMAL HIGH (ref 3.4–4.6)
POTASSIUM WHOLE BLOOD: 4.8 mmol/L — ABNORMAL HIGH (ref 3.4–4.6)
POTASSIUM WHOLE BLOOD: 4.9 mmol/L — ABNORMAL HIGH (ref 3.4–4.6)
POTASSIUM WHOLE BLOOD: 5 mmol/L — ABNORMAL HIGH (ref 3.4–4.6)
SODIUM WHOLE BLOOD: 146 mmol/L — ABNORMAL HIGH (ref 135–145)
SODIUM WHOLE BLOOD: 140 mmol/L (ref 135–145)
SODIUM WHOLE BLOOD: 142 mmol/L (ref 135–145)
SODIUM WHOLE BLOOD: 141 mmol/L (ref 135–145)

## 2020-07-06 LAB — CBC
HEMATOCRIT: 34 % — ABNORMAL LOW (ref 36.0–46.0)
HEMOGLOBIN: 10.8 g/dL — ABNORMAL LOW (ref 12.0–16.0)
MEAN CORPUSCULAR HEMOGLOBIN CONC: 31.8 g/dL (ref 31.0–37.0)
MEAN CORPUSCULAR HEMOGLOBIN: 29 pg (ref 26.0–34.0)
MEAN CORPUSCULAR VOLUME: 91.1 fL (ref 80.0–100.0)
MEAN PLATELET VOLUME: 8.3 fL (ref 7.0–10.0)
RED BLOOD CELL COUNT: 3.73 10*12/L — ABNORMAL LOW (ref 4.00–5.20)
RED CELL DISTRIBUTION WIDTH: 14.7 % (ref 12.0–15.0)
WBC ADJUSTED: 9.4 10*9/L (ref 4.5–11.0)

## 2020-07-06 LAB — BASE EXCESS ARTERIAL
Base excess:SCnc:Pt:BldA:Qn:Calculated: -3.8 — ABNORMAL LOW
Base excess:SCnc:Pt:BldA:Qn:Calculated: -4.8 — ABNORMAL LOW

## 2020-07-06 LAB — TACROLIMUS, TROUGH: Lab: 5.8

## 2020-07-06 LAB — D-DIMER QUANTITATIVE (CW,ML,HL): Lab: 1963 — ABNORMAL HIGH

## 2020-07-06 LAB — PH ARTERIAL: pH:LsCnc:Pt:BldA:Qn:: 7.3 — ABNORMAL LOW

## 2020-07-06 LAB — BUN / CREAT RATIO: Urea nitrogen/Creatinine:MRto:Pt:Ser/Plas:Qn:: 28

## 2020-07-06 LAB — ANION GAP: Anion gap 3:SCnc:Pt:Ser/Plas:Qn:: 6

## 2020-07-06 LAB — MAGNESIUM: Magnesium:MCnc:Pt:Ser/Plas:Qn:: 2.2

## 2020-07-06 LAB — SPECIMEN SOURCE

## 2020-07-06 LAB — PHOSPHORUS: Phosphate:MCnc:Pt:Ser/Plas:Qn:: 3.4

## 2020-07-06 LAB — PLATELET COUNT: Platelets:NCnc:Pt:Bld:Qn:Automated count: 330

## 2020-07-06 MED ADMIN — polyethylene glycol (MIRALAX) packet 17 g: 17 g | ORAL | @ 01:00:00

## 2020-07-06 MED ADMIN — tacrolimus (PROGRAF) oral suspension: 1 mg | GASTROENTERAL | @ 01:00:00

## 2020-07-06 MED ADMIN — oxyCODONE (ROXICODONE) immediate release tablet 30 mg: 30 mg | GASTROENTERAL | @ 16:00:00 | Stop: 2020-07-17

## 2020-07-06 MED ADMIN — VECuronium (NORCURON) injection 5 mg: 5 mg | INTRAVENOUS | @ 06:00:00 | Stop: 2020-07-06

## 2020-07-06 MED ADMIN — polyethylene glycol (MIRALAX) packet 17 g: 17 g | GASTROENTERAL | @ 20:00:00

## 2020-07-06 MED ADMIN — pravastatin (PRAVACHOL) tablet 40 mg: 40 mg | ORAL | @ 12:00:00 | Stop: 2020-07-06

## 2020-07-06 MED ADMIN — LORazepam (ATIVAN) tablet 4 mg: 4 mg | GASTROENTERAL | @ 16:00:00

## 2020-07-06 MED ADMIN — enoxaparin (LOVENOX) syringe 50 mg: .5 mg/kg | SUBCUTANEOUS | @ 10:00:00 | Stop: 2020-07-06

## 2020-07-06 MED ADMIN — oxyCODONE (ROXICODONE) immediate release tablet 30 mg: 30 mg | ORAL | @ 12:00:00 | Stop: 2020-07-06

## 2020-07-06 MED ADMIN — heparin (porcine) 7,500 units/0.75 mL syringe: 7500 [IU] | SUBCUTANEOUS | @ 15:00:00

## 2020-07-06 MED ADMIN — famotidine (PEPCID) tablet 20 mg: 20 mg | GASTROENTERAL | @ 15:00:00

## 2020-07-06 MED ADMIN — senna (SENOKOT) tablet 2 tablet: 2 | ORAL | @ 12:00:00 | Stop: 2020-07-06

## 2020-07-06 MED ADMIN — insulin regular (HumuLIN,NovoLIN) injection 0-12 Units: 0-12 [IU] | SUBCUTANEOUS | @ 10:00:00

## 2020-07-06 MED ADMIN — HYDROmorphone 1 mg/mL in 0.9% sodium chloride: 0-10 mg/h | INTRAVENOUS | @ 02:00:00 | Stop: 2020-07-16

## 2020-07-06 MED ADMIN — tacrolimus (PROGRAF) oral suspension: 2 mg | GASTROENTERAL | @ 12:00:00

## 2020-07-06 MED ADMIN — cefepime (MAXIPIME) 2 g in dextrose 100 mL IVPB (premix): 2 g | INTRAVENOUS | @ 03:00:00 | Stop: 2020-07-09

## 2020-07-06 MED ADMIN — LORazepam (ATIVAN) tablet 4 mg: 4 mg | ORAL | @ 07:00:00 | Stop: 2020-07-06

## 2020-07-06 MED ADMIN — oxyCODONE (ROXICODONE) immediate release tablet 30 mg: 30 mg | ORAL | @ 07:00:00 | Stop: 2020-07-06

## 2020-07-06 MED ADMIN — LORazepam (ATIVAN) tablet 4 mg: 4 mg | GASTROENTERAL | @ 20:00:00

## 2020-07-06 MED ADMIN — insulin regular (HumuLIN,NovoLIN) injection 0-12 Units: 0-12 [IU] | SUBCUTANEOUS | @ 03:00:00

## 2020-07-06 MED ADMIN — HYDROmorphone 1 mg/mL in 0.9% sodium chloride: 0-10 mg/h | INTRAVENOUS | @ 06:00:00 | Stop: 2020-07-06

## 2020-07-06 MED ADMIN — heparin (porcine) 7,500 units/0.75 mL syringe: 7500 [IU] | SUBCUTANEOUS | @ 20:00:00

## 2020-07-06 MED ADMIN — metoclopramide (REGLAN) injection 5 mg: 5 mg | INTRAVENOUS | @ 10:00:00 | Stop: 2020-07-06

## 2020-07-06 MED ADMIN — HYDROmorphone (PF) (DILAUDID) injection 4 mg: 4 mg | INTRAVENOUS | @ 06:00:00 | Stop: 2020-07-06

## 2020-07-06 MED ADMIN — senna (SENOKOT) tablet 2 tablet: 2 | ORAL | @ 01:00:00

## 2020-07-06 MED ADMIN — midazolam in sodium chloride 0.9% (1 mg/mL) infusion PMB: 0-6 mg/h | INTRAVENOUS | @ 09:00:00 | Stop: 2020-07-06

## 2020-07-06 MED ADMIN — vancomycin (VANCOCIN) 1750 mg in sodium chloride (NS) 0.9 % 500 mL IVPB (premix): 1750 mg | INTRAVENOUS | @ 04:00:00

## 2020-07-06 MED ADMIN — oxyCODONE (ROXICODONE) immediate release tablet 30 mg: 30 mg | ORAL | @ 03:00:00 | Stop: 2020-07-17

## 2020-07-06 MED ADMIN — insulin NPH (HumuLIN,NovoLIN) injection 6 Units: 6 [IU] | SUBCUTANEOUS | @ 13:00:00 | Stop: 2020-07-06

## 2020-07-06 MED ADMIN — cefepime (MAXIPIME) 1 g in sodium chloride 0.9 % (NS) 100 mL IVPB-connector bag: 1 g | INTRAVENOUS | @ 18:00:00 | Stop: 2020-07-09

## 2020-07-06 MED ADMIN — HYDROmorphone 1 mg/mL in 0.9% sodium chloride: 0-10 mg/h | INTRAVENOUS | @ 11:00:00 | Stop: 2020-07-06

## 2020-07-06 MED ADMIN — insulin NPH (HumuLIN,NovoLIN) injection 10 Units: 10 [IU] | SUBCUTANEOUS | @ 23:00:00 | Stop: 2020-07-06

## 2020-07-06 NOTE — Unmapped (Addendum)
Care Management  Initial Transition Planning Assessment    Per chart review, patient is a 54 y.o. female with PMH cirrhosis status post liver transplant 2016 on chronic immunosuppressive therapy, hypertension, diabetes, hypothyroidism who [facility] with myalgias, sore throat, loss of taste and fevers  x5 days. When her symptoms had started, she took a home Covid test on 7/17 which was found to be positive. She has had poor p.o. intake for the last several days. Her husband also has similar symptoms. In the OSH ED she was found to be severely hypoxic with O2 saturations of 50% on room air. She was placed on a nonrebreather and current saturations increased to the low 90s. Chest x-ray shows bilateral infiltrates consistent with COVID-19 pneumonia. Her Covid test is positive. Inflammatory markers are elevated.    CM initial assessment completed telephonically as a precautionary measure in accordance with [facility] COVID-19 Pandemic emergency response plan. Patient representative husband Cornelia Walraven verbalized understanding and agreement with completing this assessment by telephone.     Patient lives with husband Vernia Buff in [place]). At baseline patient ambulates without assistance and is independent with ADL's. She does not use home health services and Her only DME is CPAP. Husband agrees to [facility] if needed and will will provide transportation home. CM discussed COVID exposure and implications. Family encouraged to call [facility] at [phone] for COVID counseling and testing. Husband reports he tested positive for COVID and is quarantined at home.     Food Insecurity: Patient not at risk  Editor, commissioning- Payor: MEDICARE / Plan: MEDICARE PART A AND PART B / Product Type: *No Product type* /   Secondary Insurance ??? Secondary Insurance  MEDICAID [place]  Preferred Pharmacy - [id number] - [place], [place] - [address] AT [facility]  [id number] - [place], [place] - [address]  [facility]  Medical Provider(s): Rebecka Apley, NP  Previous admit date: 09/30/2015  After discharge, the patient will obtain follow up care from: PCP: Rebecka Apley, NP  Advance Directives: No  Level of function prior to admission: Independent  HME: CPAP  Transportation home: Private vehicle  Dialysis: No              General  Care Manager assessed the patient by : Telephone conversation with family, Medical record review, Discussion with Clinical Care team  Orientation Level: Other (Comment) (UTA)  Functional level prior to admission: Independent  Who provides care at home?: N/A  Reason for referral: Discharge Planning    Contact/Decision Maker  Extended Emergency Contact Information  Primary Emergency Contact: Amayiah, Gosnell  Mobile Phone: [phone]  Relation: Spouse  Secondary Emergency Contact: Al [place]  Home Phone: [phone]  Work Phone: [phone]  Mobile Phone: [phone]  Relation: Daughter    Legal Next of Kin / Guardian / POA / Advance Directives     Advance Directive (Medical Treatment)  Does patient have an advance directive covering medical treatment?: Patient does not have advance directive covering medical treatment.  Reason patient does not have an advance directive covering medical treatment:: Patient has been deemed unable to make medical decisions, cannot communicate.    Health Care Decision Maker [HCDM] (Medical & Mental Health Treatment)  Information offered on HCDM, Medical & Mental Health advance directives:: Patient has been deemed unable to make medical decisions, cannot communicate.     Patient Information  Lives with: Spouse/significant other  Type of Residence: Private residence (Single level home with a ramp to enter)        Location/Detail: [address]  [place], [place] [address]    Support Systems/Concerns: Spouse    Responsibilities/Dependents at home?: No    Home Care services in place prior to admission?: No (Family is agreeable to home health services if recommended, no preference of agency but would like to look at a list of agencies if [facility] is recommended.)              Equipment Currently Used at Home: other (see comments) (CPAP)     Currently receiving outpatient dialysis?: No     Financial Information     Need for financial assistance?: No (Medicare. Denies food insecurity.)     Social Determinants of Health  Social Determinants of Health     Tobacco Use: Low Risk    ??? Smoking Tobacco Use: Never Smoker   ??? Smokeless Tobacco Use: Never Used   Alcohol Use: Not At Risk   ??? How often do you have 5 or more drinks on one occasion?: 0   ??? How many drinks containing alcohol do you have on a typical day when you are drinking?: Not on file   ??? How often do you have a drink containing alcohol?: 0   Financial Resource Strain: Low Risk    ??? Difficulty of Paying Living Expenses: Not very hard   Food Insecurity: No Food Insecurity   ??? Worried About Running Out of Food in the Last Year: Never true   ??? Ran Out of Food in the Last Year: Never true   Transportation Needs: No Transportation Needs   ??? Lack of Transportation (Medical): No   ??? Lack of Transportation (Non-Medical): No   Physical Activity:    ??? Days of Exercise per Week:    ??? Minutes of Exercise per Session:    Stress:    ??? Feeling of Stress :    Social Connections:    ??? Frequency of Communication with Friends and Family:    ??? Frequency of Social Gatherings with Friends and Family:    ??? Attends Religious Services:    ??? Database administrator or Organizations:    ??? Attends Engineer, structural:    ??? Marital Status:    Intimate Programme researcher, broadcasting/film/video Violence:    ??? Fear of Current or Ex-Partner:    ??? Emotionally Abused:    ??? Physically Abused:    ??? Sexually Abused:    Depression:    ??? PHQ-2 Score:    Housing Stability:    ??? Within the past 12 months, have you ever stayed: outside, in a car, in a tent, in an overnight shelter, or temporarily in someone else's home (i.e. couch-surfing)?:    ??? Are you worried about losing your housing?:    ??? Within the past 12 months, have you been unable to get utilities (heat, electricity) when it was really needed?:    Substance Use: Low Risk    ??? Taken prescription drugs for non-medical reasons: Never   ??? Taken illegal drugs: Never   ??? Patient indicated they have taken drugs in the past year, including Cannabis, Cocaine, Prescription stimulants, Methamphetamine, Inahalnts, Sedatives or sleeping pills, Hallucinogens, Street Opioids or Prescription opiods for non-medical reasons: Not on file   Health Literacy:    ??? :      Discharge Needs Assessment  Concerns to be Addressed: discharge planning  Clinical Risk Factors: New Diagnosis, Multiple Diagnoses (Chronic)    Barriers to taking medications: No    Prior overnight hospital stay or ED visit in last 90 days: No    Readmission Within the Last 30 Days: no previous admission in last 30 days    Patient's Choice of Community Agency(s): No preference stated, CM will offer choice as appropriate.    Anticipated Changes Related to Illness: other (see comments) (Likely will need time to recover before sresuming usual activities.)    Equipment Needed After Discharge: other (see comments) (CM will follow for DME needs.)    Discharge Facility/Level of Care Needs: other (see comments) (CM will provide choice of facilities/services if deemed medically necessary.)    Readmission  Risk of Unplanned Readmission Score: UNPLANNED READMISSION SCORE: 27%  Predictive Model Details          27% (High)  Factor Value    Calculated 07/06/2020 12:04 24% Number of active Rx orders 43    [place] Risk of Unplanned Readmission Model 8% Active antipsychotic Rx order present     8% ECG/EKG order present in last 6 months     7% Latest calcium low (8.2 mg/dL)     6% Latest BUN high (69 mg/dL)     6% Diagnosis of electrolyte disorder present     6% Restraint order present in last 6 months     5% Imaging order present in last 6 months     5% Latest hemoglobin low (10.8 g/dL)     5% Phosphorous result present     4% Active anticoagulant Rx order present     4% Active corticosteroid Rx order present     3% Age 58     3% Latest creatinine high (2.50 mg/dL)     3% Current length of stay 3.75 days     2% Charlson Comorbidity Index 2     2% Future appointment scheduled     1% Active ulcer medication Rx order present      Readmitted Within the Last 30 Days? (No if blank)   Patient at risk for readmission?: Yes    Discharge Plan  Screen findings are: Discharge planning needs identified or anticipated (Comment). (CM will follow for DC needs.)    Expected Discharge Date:     Expected Transfer from Critical Care:       Patient and/or family were provided with choice of facilities / services that are available and appropriate to meet post hospital care needs?: Other (Comment) (CM will provide choice of facilities/services if deemed medically necessary.)     Initial Assessment complete?: Yes

## 2020-07-06 NOTE — Unmapped (Signed)
Tacrolimus Therapeutic Monitoring Pharmacy Note    Evelyn Rollins is a 54 y.o. female continuing tacrolimus.     Indication: Liver transplant     Date of Transplant: 2016      Prior Dosing Information: Home regimen  tacrolimus 2mg  am and 1mg  pm     Goals:  Therapeutic Drug Levels  Tacrolimus trough goal: 3-5 ng/mL per Consult note 07/03/20    Additional Clinical Monitoring/Outcomes  ?? Monitor renal function (SCr and urine output) and liver function (LFTs)  ?? Monitor for signs/symptoms of adverse events (e.g., hyperglycemia, hyperkalemia, hypomagnesemia, hypertension, headache, tremor)    Results:   Tacrolimus level: 5.8 ng/mL, drawn appropriately (~1hr before true trough    Pharmacokinetic Considerations and Significant Drug Interactions:  ??? Concurrent hepatotoxic medications: none identified at time of note  ??? Concurrent CYP3A4 substrates/inhibitors: none identified at time of note  ??? Concurrent nephrotoxic medications: none identified at time of note    Assessment/Plan:  UNLESS INDICATED OTHERWISE BY TRANSPLANT TEAM    Recommendation(s)  ??? Continue tacro SUSP 2 mg QAM and 1 mg QPM, slightly uptrending from yesterday   ??? Will continue to watch levels daily - trending up over the last 3 days, may require a dose reduction    Follow-up  ??? Daily with AM labs (between 0600-0800, within 1h preferred) PRIOR to morning dose .   ??? A pharmacist will continue to monitor and recommend levels as appropriate    Longitudinal Dose Monitoring:  Date Dose (mg), Route AM Scr (mg/dL) Level  (ng/mL) Key Drug Interactions   07/28       07/27       07/26 2+1 NG 2.50 5.8 (0650)    07/25 2+1 NG 1.38 3.5 (1610)    07/24 2+1 NG 1.52 2.3 (9604)    07/23 1mg  NG 1.3 1.8 (5409)         Please page service pharmacist with questions/clarifications.    Charleen Kirks, PharmD

## 2020-07-06 NOTE — Unmapped (Signed)
MRI on hold until screening form is completed in epic. Please answer all the questions in the form. If patient isn't able to answer the questions, please have immediate family member to help answer the questions and list their name and their number in the forms.     Patient needs to be in a hospital gown. IV should be med locked or have 30 feet of extension on. All medication patches should be removed prior to coming to MRI. Please call MRI at [phone] if you have any questions.     So far patient has been unable to tolerate conditions for MRI. Desats to low-mid 80s when flat per RN, so expedite study is on hold until patient can safely tolerate.    Thank you!

## 2020-07-06 NOTE — Unmapped (Signed)
CONTINUOUS VIDEO-EEG MONITORING REPORT    Patient: Evelyn Rollins  Date of Birth: November 16, 1966  Attending: Florian Buff, M.D.  Ordering Provider: Alison Stalling                                    [facility] No:  [id number]    DATE STARTED: 07/03/2020  21:15  DATE ENDED: 07/05/2020  14:40    HISTORY   Per chart, Evelyn Rollins is 54 y.o. years old female w/pmh of cirrhosis s/p liver transplant, HTN, diabetes & hypothyroidism presenting w/sz-like activity. cEEG ordered.    PROCEDURE  Continuous video-EEG was performed while awake utilizing 21 active electrodes placed according to the international 10-20 system.  The study was recorded digitally with a bandpass of 1-70Hz  and a sampling rate of 200Hz  and was reviewed with the possibility of multiple reformatting. The study was digitally processed with potential spike and seizure events identified for physician analysis and review.  Patient recognized events were identified by a push button marker and reviewed by the physician.  Background activity was reviewed from the continuously recorded data. Simultaneous video was reviewed for all patient events.      TECHNICAL DESCRIPTION:    Day 1 (07/03/20 @21 :15 - 07/04/20 @7 :00)    Stupor/Coma  No posterior dominant rhythm is observed and no bilateral beta activity. There is a 0.5-1Hz , 20-40uV, continuous generalized slow, which is polymorphic, reactive and variable, with overriding low-voltage delta/theta frequencies    Sleep  No N2 sleep structures observed    Events  None. No push button events    EKG recording shows a regular heart rhythm of 60-80 bpm.    Day 2 (07/04/20 @7 :00 - 07/05/20 @7 :00)  The EEG continued to show findings similar to the previous day???s recording.     Events    (7:14, 8:35, 12:12)  Clinical Classification: Paroxysmal Event (shaking)  Clinical Description: Patient is lying in bed when he abruptly has low-amplitude/high-frequency shaking of the entire body lasting a few seconds. No other clinical symptoms were observed. Similar clinical symptoms are seen with subsequent events (8:35, 12:12), both occurring with stimulation by medical staff.  EEG Classification: No EEG change  EEG Description: No EEG changes were seen    (22:16)  Clinical Classification: Paroxsymal Event (O2 desaturation)  Clinical Description: Push button was pressed for persistent O2 desaturation and brief intermittent body shaking as described above. Push button was pressed multiples times between (22:16-22:22)  EEG Classification: No EEG change  EEG Description: No EEG changes were observed    Day 3 (07/05/20 @7 :00 - 07/05/20 @14 :40)  The EEG continued to show findings similar to the previous day???s recording. There were no push button events      CLASSIFICATION:   - Continuous slow, generalized    Events  Clinical Classification: Paroxysmal Event (shaking)  EEG Classification: No EEG change    Clinical Classification: Paroxsymal Event (O2 desaturation)  EEG Classification: No EEG change    IMPRESSION   This 3 days of continuous EEG monitoring is consistent with a moderate encephalopathy. There were 3 push button events for brief body shaking which had no EEG correlate. In addition there were multiple push button events at 22:16 for O2 desaturation and brief body shaking which also had no associated EEG changes. There were no seizures or epileptiform activity.

## 2020-07-06 NOTE — Unmapped (Signed)
MICU Evening Summary     Date of Service: 07/05/2020    Interval History: Evelyn Rollins is a 54 y.o. 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 ??Pt was intubated for hypoxic respiratory failure. Critical care services are indicated for AHRF r/t covid pna, AKI, ileus, VAP.       Assessment & Plan     Neuro: cont scheduled oxy/ativan and versed/dilaudid gtts for vent sync and compliance. vec given prior to supination. Cont to have intmt truncal and BUE myoclonus (several beats) w stim. EEG removed since no events assoc w sz, perrl 4Sl, mdl gaze, unable to tol MRI/CT d/t inability to lay flat wo desat amidst sedation/paralysis  Pulm: tolerating VC, supinated, cxr w improved aeration,    CV:low dose levo for MAP >65, able to wean to off after volume, scvo2 = 52m consider formal echo in am   Renal: elevated Cr, fluid bolus given, urine lytes suggest pre-renal which improved w volume.   GI: cont bowel regimen, cont reglan x 24hr for inc residuals  Heme: lovenox group B, no evid bleeding  Endo: nph added 6q12 + ADI  ID: cont vanc/cefep, gpc blood likely contaminate and sputum 3+ staph      Critical Care Attestation     This patient is critically ill or injured with the impairment of vital organ systems such that there is a high probability of imminent or life threatening deterioration in the patient's condition. This patient must remain in the ICU for ongoing evaluation of the comprehensive management plan outlined in this note. I directly provided critical care services as documented in this note and the critical care time spent (45 min) is exclusive of separately billable procedures.    Terri Rorrer Fonnie Mu, ACNP

## 2020-07-06 NOTE — Unmapped (Signed)
IMMUNOCOMPROMISED HOST INFECTIOUS DISEASE PROGRESS NOTE      Evelyn Rollins is being seen in consultation at the request of Evelyn Rollins, * for evaluation of COVID-19 in transplant patient.    Assessment/Recommendations:    Evelyn Rollins is a 54 y.o. female with PMHx including NAFLD s/p OLT in 2016, DM2 not on insulin (unknown A1c), seizures x1 in remote past and morbid obesity who presented to an OSH on 7/22 with 7 days of symptoms and profound hypoxia due to critical COVID-19 ARDS requiring intubation on 7/22, now with c/f secondary bacterial PNA (MSSA).    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   - Surgical complications: biliary anastomosis leak with pelvic collection, s/p abx  - Serologies: CMV D-/R+, EBV D+/R+, Toxo D?/R-  - Induction: basiliximab  - Immunosuppression: myfortic, tac  - Prophylaxis: none    Pertinent Exposure History  Donor Infections: Donor death from drowning     Pertinent Co-morbidities  # Morbid obesity  # Hypothyroidism  # DM - unknown A1c  # Depression/anxiety  # Seizure x1 in remote past    Drug Intolerances - none     Infection History  # Critical SARS CoV-2 infection with ARDS requiring intubation and w/ c/f MSSA PNA (7/22) in unvaccinated patient, onset 06/24/20   -Known exposure(s): daughter, son-in-law on 7/10  -Date of symptom onset : 7/14  -Date of diagnostic test: 7/17 (home test), 7/22 at OSH  -Remdesivir administered: 7/22-   -Steroids administered: dexamethasone 7/22-  -CCP 1U or 2U administered: n/a  -Monoclonal antibody administered: n/a  -Tocilizumab administered: n/a  -07/04/20 Lower resp Cx: 3+ MSSA  -Abx: Cefepime & Vancomycin: 7/25-current    S Epi bacteremia (MRSE) 07/04/20 favored as contaminant but not proven  Favored as a contaminant as only growing 1 of 2 bottles from 7/24.  -07/04/20 BC 1 of 2 w/ MRSE  -Recommended repeat blood cultures 7/26         RECOMMENDATIONS FOR 07/06/2020    Diagnostic  ??? Repeat BC x 2 to ensure blood cultures remain clear    Treatment  ??? Agree dexamethasone 6mg  x10 days   ??? Agree with remdesivir x5 days  ??? OK to continue cefepime, vancomycin given c/f secondary bacterial PNA with MSSA and S epi bacteremia (favored as a contaminant, but repeat BC not yet completed).           The [facility] ID service will continue to follow from afar. Please page the ID Transplant/Liquid Oncology Fellow consult at [phone] with questions. Patient discussed with Dr. Mila Homer.    [name], MD, MPH  Fellow, [facility] Infectious Diseases    Interim History:      Source of information includes:  Electronic Medical Records.  History obtained from:chart     Supinated in AM and at time of chart review was on FiO2 70->60 w/ PEEP 20. Last febrile 7/25. Remains on NE with worsening renal function. Plan for head imaging when stable    Allergies:  No Known Allergies    Medications:   Antimicrobials:  Anti-infectives (From admission, onward)    Start     Dose/Rate Route Frequency Ordered Stop    07/06/20 1200  cefepime (MAXIPIME) 1 g in sodium chloride 0.9 % (NS) 100 mL IVPB-connector bag      1 g  200 mL/hr over 30 Minutes Intravenous Every 12 hours 07/06/20 0837 07/09/20 2359    07/05/20 0000  vancomycin (VANCOCIN) 1750 mg in sodium chloride (  NS) 0.9 % 500 mL IVPB (premix)      1,750 mg  285.7 mL/hr over 105 Minutes Intravenous Every 24 hours 07/04/20 2336            Current/Prior immunomodulators:  Dexamethasone 6mg  7/22-   Myfortic  Tac    Other medications reviewed.     Review of Systems:  All other systems reviewed are negative.        Vital Signs last 24 hours:  Core Temp:  [36.9 ??C-37.4 ??C] 37.2 ??C  Heart Rate:  [66-88] 66  SpO2 Pulse:  [67-87] 67  Resp:  [24-32] 27  A BP-2: (106-157)/(43-82) 117/49  MAP:  [61 mmHg-103 mmHg] 66 mmHg  FiO2 (%):  [50 %-100 %] 60 %  SpO2:  [85 %-99 %] 98 %    Physical Exam:  Patient Lines/Drains/Airways Status    Active Active Lines, Drains, & Airways     Name:   Placement date:   Placement time:   Site:   Days:    ETT 7.5   06/14/2020    1700     3    CVC Triple Lumen 06/15/2020 Non-tunneled Right Internal jugular   06/15/2020    1945    Internal jugular   3    NG/OG Tube Decompression;Feedings Center mouth   07/03/20    0610    Center mouth   3    Urethral Catheter   07/07/2020    ???    ???   4    Peripheral IV 06/26/2020 Left Antecubital   07/10/2020    1300    Antecubital   4    Arterial Line 06/20/2020 Right Radial   07/11/2020    2315    Radial   3              Physical exam deferred.   Vitals w/ PEEP 20, FiO2 60. Tachycardic. Remains on NE.     Data for Medical Decision Making  ( [id number] )     Recent Labs   Lab Units 07/06/20  0841 07/06/20  0450 07/05/20  2135 07/05/20  2101 07/05/20  1811 07/05/20  7829 07/05/20  0455 07/05/20  0311 07/04/20  5621 07/04/20  0313 07/03/20  0327 07/03/20  0327 06/11/2020  2053 07/05/2020  2053   WBC 10*9/L  --  9.4  --   --   --   --   --  12.9*  --  10.5  --  5.8  --  8.2   HEMOGLOBIN g/dL  --  30.8*  --   --   --   --   --  13.0  --  12.6  --  12.9  --  14.2   PLATELET COUNT (1) 10*9/L  --  330  --   --   --   --   --  410  --  403  --  214  --  219   NEUTRO ABS 10*9/L  --   --   --   --   --   --   --   --   --   --   --   --   --  7.0   LYMPHO ABS 10*9/L  --   --   --   --   --   --   --   --   --   --   --   --   --  0.9*   EOSINO ABS 10*9/L  --   --   --   --   --   --   --   --   --   --   --   --   --  0.0   BUN mg/dL  --  69* 64*  --  62*  --   --  45*  --  44*   < > 24*   < > 20   CREATININE mg/dL  --  1.61* 0.96*  --  0.45*  --   --  1.38*  --  1.52*   < > 1.33*   < > 1.17*   AST U/L  --  64* 51*  --   --   --   --  23  --  26  --  41*   < > 53*   ALT U/L  --  39 32  --   --   --   --  24  --  31  --  36   < > 44   BILIRUBIN TOTAL mg/dL  --  0.2* 0.3  --   --   --   --  0.3  --  0.2*  --  0.3   < > 0.4   ALK PHOS U/L  --  46 47  --   --   --   --  48  --  55  --  59   < > 65   POTASSIUM WHOLE BLOOD mmol/L 4.8* 4.8*  --  4.7*  --    < >   < >  --    < >  --   --   --   --  4.0   POTASSIUM mmol/L  -- 4.9* 4.8*  --  4.8*  --    < > 3.7   < > 4.3   < > 4.0   < > 4.0   MAGNESIUM mg/dL  --  2.2  --   --   --   --   --  2.1  --  2.4  --  1.9  --  2.0   PHOSPHORUS mg/dL  --  3.4  --   --   --   --   --  2.9  --  3.9  --  3.1  --  2.7   CALCIUM mg/dL  --  8.2* 8.3*  --  8.3*  --   --  8.4*  --  8.5*   < > 8.0*   < > 8.3*   CK TOTAL U/L  --   --   --   --   --   --   --   --   --   --   --   --   --  198.0*    < > = values in this interval not displayed.       Drug Level   Results in Past 360 Days  Result Component Current Result   Vancomycin Rm 14.2 (07/05/2020)   Tacrolimus, Trough 5.8 (07/06/2020)       New Culture Data   Microbiology Results (last day)     Procedure Component Value Date/Time Date/Time    Lower Respiratory Culture [[id number]]  (Abnormal)  (Susceptibility) Collected: 07/04/20 1328    Lab Status: Preliminary result Specimen: Aspirate, Tracheal from Tracheal aspirate Updated: 07/06/20 1324     Lower Respiratory Culture 3+ Methicillin-Susceptible Staphylococcus aureus     Gram Stain <10  Epithelial cells/LPF      >25 PMNS/LPF      4+ Gram positive cocci      Acceptable for culture    Narrative:      Specimen Source: Tracheal aspirate    Susceptibility  Methicillin-Susceptible Staphylococcus aureus (1)     Antibiotic Interpretation Microscan Method Status    Nafcillin Susceptible  KIRBY BAUER Preliminary     For predictive information: http://[email or web address]//professional-education-services/[email or web address]/[email or web address]-tests/culture-[email or web address]-information-for-staphylococci-resi/    Erythromycin Susceptible  KIRBY BAUER Preliminary    Clindamycin Susceptible  KIRBY BAUER Preliminary    Doxycycline Susceptible  KIRBY BAUER Preliminary    Gentamicin Susceptible  KIRBY BAUER Preliminary     Gentamicin is used only in combination with other active agents that test susceptible    Trimethoprim + Sulfamethoxazole Susceptible  KIRBY BAUER Preliminary    Vancomycin Susceptible 2 MIC SUSCEPTIBILITY RESULT Preliminary    Fluoroquinolone No Interpretation  KIRBY BAUER Preliminary     Fluoroquinolones are not indicated for the treatment of staphylococcal infections, including MRSA.                    Blood Culture, Adult [[id number]] Collected: 07/04/20 0916    Lab Status: Preliminary result Specimen: Blood from 1 Peripheral Draw Updated: 07/06/20 0930     Blood Culture, Routine No Growth at 48 hours          Recent Studies     XR Chest Portable    Result Date: 07/06/2020  EXAM: XR CHEST PORTABLE DATE: 07/06/2020 4:05 AM ACCESSION: [id number] UN DICTATED: 07/06/2020 9:05 AM INTERPRETATION LOCATION: [facility] CLINICAL INDICATION: 54 years old Female with ETT (VENTILATOR/RESPIRATOR DEP STATUS)  COMPARISON: Chest x-ray 07/04/2020, 07/04/2019 with TECHNIQUE: Portable Chest Radiograph. FINDINGS: Unchanged lines and tubes. Lungs are hypoinflated. Mildly improved heterogeneous and hazy bilateral opacities. Dense left retrocardiac opacity is unchanged. No pleural effusion or pneumothorax. Stable cardiomediastinal silhouette.     Mild improved diffuse heterogeneous and hazy bilateral airspace opacities.

## 2020-07-06 NOTE — Unmapped (Signed)
See micu flowsheets for detailed assessments, vitals, labs and I/O. Pt remains sedated with versed/drips. Norepi continues at 2mcg/min. Pt put on 100% o2, given prn versed/dilaudid and 5mg  vecuronium per md order and then supinated early this am. Pt desaturated to mid 80's for approx but then recovered to 90% spo2. Trialed pt in supine/flat position to assess fitness for MRI but pt unable to tolerate with o2 sats low-mid 80's. MICU NP made aware. Will continue to monitor.

## 2020-07-07 ENCOUNTER — Encounter (HOSPITAL_COMMUNITY): Payer: Self-pay | Admitting: Emergency Medicine

## 2020-07-07 LAB — BASIC METABOLIC PANEL
ANION GAP: 9 mmol/L (ref 5–14)
BLOOD UREA NITROGEN: 98 mg/dL — ABNORMAL HIGH (ref 9–23)
BUN / CREAT RATIO: 28
CALCIUM: 8.2 mg/dL — ABNORMAL LOW (ref 8.7–10.4)
CO2: 21 mmol/L (ref 20.0–31.0)
CREATININE: 3.48 mg/dL — ABNORMAL HIGH
EGFR CKD-EPI AA FEMALE: 16 mL/min/{1.73_m2} — ABNORMAL LOW (ref >=60)
EGFR CKD-EPI NON-AA FEMALE: 14 mL/min/{1.73_m2} — ABNORMAL LOW (ref >=60)
GLUCOSE RANDOM: 281 mg/dL — ABNORMAL HIGH (ref 70–179)
SODIUM: 144 mmol/L (ref 135–145)

## 2020-07-07 LAB — ENDOTOOL NEXT GLUCOSE

## 2020-07-07 LAB — COMPREHENSIVE METABOLIC PANEL
ALBUMIN: 1.9 g/dL — ABNORMAL LOW (ref 3.4–5.0)
ALBUMIN: 1.7 g/dL — ABNORMAL LOW (ref 3.4–5.0)
ALBUMIN: 1.8 g/dL — ABNORMAL LOW (ref 3.4–5.0)
ALKALINE PHOSPHATASE: 53 U/L (ref 46–116)
ALKALINE PHOSPHATASE: 52 U/L (ref 46–116)
ALT (SGPT): 56 U/L — ABNORMAL HIGH (ref 10–49)
ALT (SGPT): 55 U/L — ABNORMAL HIGH (ref 10–49)
ALT (SGPT): 46 U/L (ref 10–49)
ALT (SGPT): 43 U/L (ref 10–49)
ANION GAP: 8 mmol/L (ref 5–14)
ANION GAP: 8 mmol/L (ref 5–14)
ANION GAP: 9 mmol/L (ref 5–14)
ANION GAP: 8 mmol/L (ref 5–14)
AST (SGOT): 73 U/L — ABNORMAL HIGH (ref <=34)
AST (SGOT): 59 U/L — ABNORMAL HIGH (ref <=34)
AST (SGOT): 36 U/L — ABNORMAL HIGH (ref <=34)
AST (SGOT): 34 U/L (ref <=34)
BILIRUBIN TOTAL: 0.2 mg/dL — ABNORMAL LOW (ref 0.3–1.2)
BILIRUBIN TOTAL: 0.2 mg/dL — ABNORMAL LOW (ref 0.3–1.2)
BILIRUBIN TOTAL: 0.2 mg/dL — ABNORMAL LOW (ref 0.3–1.2)
BILIRUBIN TOTAL: <0.2 mg/dL — ABNORMAL LOW (ref 0.3–1.2)
BLOOD UREA NITROGEN: 83 mg/dL — ABNORMAL HIGH (ref 9–23)
BLOOD UREA NITROGEN: 96 mg/dL — ABNORMAL HIGH (ref 9–23)
BLOOD UREA NITROGEN: 91 mg/dL — ABNORMAL HIGH (ref 9–23)
BLOOD UREA NITROGEN: 106 mg/dL — ABNORMAL HIGH (ref 9–23)
BUN / CREAT RATIO: 26
BUN / CREAT RATIO: 29
BUN / CREAT RATIO: 25
BUN / CREAT RATIO: 28
CALCIUM: 8.2 mg/dL — ABNORMAL LOW (ref 8.7–10.4)
CALCIUM: 8.1 mg/dL — ABNORMAL LOW (ref 8.7–10.4)
CALCIUM: 8.1 mg/dL — ABNORMAL LOW (ref 8.7–10.4)
CHLORIDE: 113 mmol/L — ABNORMAL HIGH (ref 98–107)
CHLORIDE: 109 mmol/L — ABNORMAL HIGH (ref 98–107)
CHLORIDE: 111 mmol/L — ABNORMAL HIGH (ref 98–107)
CHLORIDE: 114 mmol/L — ABNORMAL HIGH (ref 98–107)
CO2: 22 mmol/L (ref 20.0–31.0)
CO2: 21 mmol/L (ref 20.0–31.0)
CO2: 20 mmol/L (ref 20.0–31.0)
CO2: 21 mmol/L (ref 20.0–31.0)
CREATININE: 3.14 mg/dL — ABNORMAL HIGH
CREATININE: 3.27 mg/dL — ABNORMAL HIGH
CREATININE: 3.65 mg/dL — ABNORMAL HIGH
CREATININE: 3.85 mg/dL — ABNORMAL HIGH
EGFR CKD-EPI AA FEMALE: 19 mL/min/{1.73_m2} — ABNORMAL LOW (ref >=60)
EGFR CKD-EPI AA FEMALE: 18 mL/min/{1.73_m2} — ABNORMAL LOW (ref >=60)
EGFR CKD-EPI AA FEMALE: 15 mL/min/{1.73_m2} — ABNORMAL LOW (ref >=60)
EGFR CKD-EPI AA FEMALE: 14 mL/min/{1.73_m2} — ABNORMAL LOW (ref >=60)
EGFR CKD-EPI NON-AA FEMALE: 16 mL/min/{1.73_m2} — ABNORMAL LOW (ref >=60)
EGFR CKD-EPI NON-AA FEMALE: 15 mL/min/{1.73_m2} — ABNORMAL LOW (ref >=60)
EGFR CKD-EPI NON-AA FEMALE: 13 mL/min/{1.73_m2} — ABNORMAL LOW (ref >=60)
GLUCOSE RANDOM: 442 mg/dL (ref 70–179)
GLUCOSE RANDOM: 207 mg/dL — ABNORMAL HIGH (ref 70–179)
POTASSIUM: 5.6 mmol/L — ABNORMAL HIGH (ref 3.4–4.5)
POTASSIUM: 5.1 mmol/L — ABNORMAL HIGH (ref 3.4–4.5)
POTASSIUM: 4.8 mmol/L — ABNORMAL HIGH (ref 3.4–4.5)
POTASSIUM: 4.8 mmol/L — ABNORMAL HIGH (ref 3.4–4.5)
PROTEIN TOTAL: 5.7 g/dL (ref 5.7–8.2)
PROTEIN TOTAL: 5.2 g/dL — ABNORMAL LOW (ref 5.7–8.2)
PROTEIN TOTAL: 5.3 g/dL — ABNORMAL LOW (ref 5.7–8.2)
PROTEIN TOTAL: 5.6 g/dL — ABNORMAL LOW (ref 5.7–8.2)
SODIUM: 143 mmol/L (ref 135–145)
SODIUM: 138 mmol/L (ref 135–145)
SODIUM: 140 mmol/L (ref 135–145)
SODIUM: 143 mmol/L (ref 135–145)

## 2020-07-07 LAB — BLOOD GAS CRITICAL CARE PANEL, ARTERIAL
BASE EXCESS ARTERIAL: -6.1 — ABNORMAL LOW (ref -2.0–2.0)
BASE EXCESS ARTERIAL: -6.3 — ABNORMAL LOW (ref -2.0–2.0)
BASE EXCESS ARTERIAL: -7 — ABNORMAL LOW (ref -2.0–2.0)
BASE EXCESS ARTERIAL: -7 — ABNORMAL LOW (ref -2.0–2.0)
CALCIUM IONIZED ARTERIAL (MG/DL): 4.9 mg/dL (ref 4.40–5.40)
CALCIUM IONIZED ARTERIAL (MG/DL): 4.88 mg/dL (ref 4.40–5.40)
CALCIUM IONIZED ARTERIAL (MG/DL): 4.84 mg/dL (ref 4.40–5.40)
CALCIUM IONIZED ARTERIAL (MG/DL): 4.76 mg/dL (ref 4.40–5.40)
CALCIUM IONIZED ARTERIAL (MG/DL): 4.7 mg/dL (ref 4.40–5.40)
CALCIUM IONIZED ARTERIAL (MG/DL): 4.68 mg/dL (ref 4.40–5.40)
GLUCOSE WHOLE BLOOD: 308 mg/dL — ABNORMAL HIGH (ref 70–179)
GLUCOSE WHOLE BLOOD: 339 mg/dL — ABNORMAL HIGH (ref 70–179)
GLUCOSE WHOLE BLOOD: 357 mg/dL — ABNORMAL HIGH (ref 70–179)
GLUCOSE WHOLE BLOOD: 342 mg/dL — ABNORMAL HIGH (ref 70–179)
GLUCOSE WHOLE BLOOD: 248 mg/dL — ABNORMAL HIGH (ref 70–179)
GLUCOSE WHOLE BLOOD: 188 mg/dL — ABNORMAL HIGH (ref 70–179)
HCO3 ARTERIAL: 22 mmol/L (ref 22–27)
HCO3 ARTERIAL: 20 mmol/L — ABNORMAL LOW (ref 22–27)
HCO3 ARTERIAL: 19 mmol/L — ABNORMAL LOW (ref 22–27)
HCO3 ARTERIAL: 19 mmol/L — ABNORMAL LOW (ref 22–27)
HCO3 ARTERIAL: 19 mmol/L — ABNORMAL LOW (ref 22–27)
HEMOGLOBIN BLOOD GAS: 11 g/dL — ABNORMAL LOW (ref 12.00–16.00)
HEMOGLOBIN BLOOD GAS: 10.8 g/dL — ABNORMAL LOW (ref 12.00–16.00)
HEMOGLOBIN BLOOD GAS: 10.4 g/dL — ABNORMAL LOW (ref 12.00–16.00)
HEMOGLOBIN BLOOD GAS: 10.8 g/dL — ABNORMAL LOW (ref 12.00–16.00)
LACTATE BLOOD ARTERIAL: 2.1 mmol/L — ABNORMAL HIGH (ref <1.3)
LACTATE BLOOD ARTERIAL: 2.1 mmol/L — ABNORMAL HIGH (ref <1.3)
LACTATE BLOOD ARTERIAL: 2.1 mmol/L — ABNORMAL HIGH (ref <1.3)
LACTATE BLOOD ARTERIAL: 1.9 mmol/L — ABNORMAL HIGH (ref <1.3)
O2 SATURATION ARTERIAL: 99 % (ref 94.0–100.0)
O2 SATURATION ARTERIAL: 98.5 % (ref 94.0–100.0)
O2 SATURATION ARTERIAL: 94.9 % (ref 94.0–100.0)
PCO2 ARTERIAL: 60.1 mmHg — ABNORMAL HIGH (ref 35.0–45.0)
PCO2 ARTERIAL: 47.1 mmHg — ABNORMAL HIGH (ref 35.0–45.0)
PCO2 ARTERIAL: 44.7 mmHg (ref 35.0–45.0)
PCO2 ARTERIAL: 37.4 mmHg (ref 35.0–45.0)
PCO2 ARTERIAL: 39.1 mmHg (ref 35.0–45.0)
PH ARTERIAL: 7.17 — CL (ref 7.35–7.45)
PH ARTERIAL: 7.26 — ABNORMAL LOW (ref 7.35–7.45)
PH ARTERIAL: 7.27 — ABNORMAL LOW (ref 7.35–7.45)
PH ARTERIAL: 7.3 — ABNORMAL LOW (ref 7.35–7.45)
PO2 ARTERIAL: 157 mmHg — ABNORMAL HIGH (ref 80.0–110.0)
PO2 ARTERIAL: 136 mmHg — ABNORMAL HIGH (ref 80.0–110.0)
PO2 ARTERIAL: 126 mmHg — ABNORMAL HIGH (ref 80.0–110.0)
PO2 ARTERIAL: 130 mmHg — ABNORMAL HIGH (ref 80.0–110.0)
PO2 ARTERIAL: 76.8 mmHg — ABNORMAL LOW (ref 80.0–110.0)
POTASSIUM WHOLE BLOOD: 5.6 mmol/L — ABNORMAL HIGH (ref 3.4–4.6)
POTASSIUM WHOLE BLOOD: 5.5 mmol/L — ABNORMAL HIGH (ref 3.4–4.6)
POTASSIUM WHOLE BLOOD: 5.1 mmol/L — ABNORMAL HIGH (ref 3.4–4.6)
POTASSIUM WHOLE BLOOD: 5 mmol/L — ABNORMAL HIGH (ref 3.4–4.6)
POTASSIUM WHOLE BLOOD: 4.7 mmol/L — ABNORMAL HIGH (ref 3.4–4.6)
POTASSIUM WHOLE BLOOD: 4.5 mmol/L (ref 3.4–4.6)
SODIUM WHOLE BLOOD: 142 mmol/L (ref 135–145)
SODIUM WHOLE BLOOD: 140 mmol/L (ref 135–145)
SODIUM WHOLE BLOOD: 139 mmol/L (ref 135–145)
SODIUM WHOLE BLOOD: 139 mmol/L (ref 135–145)
SODIUM WHOLE BLOOD: 140 mmol/L (ref 135–145)
SODIUM WHOLE BLOOD: 144 mmol/L (ref 135–145)

## 2020-07-07 LAB — EGFR CKD-EPI AA FEMALE
Glomerular filtration rate/1.73 sq M.predicted.black:ArVRat:Pt:Ser/Plas/Bld:Qn:Creatinine-based formula (CKD-EPI): 15 — ABNORMAL LOW

## 2020-07-07 LAB — CBC
HEMATOCRIT: 35.9 % — ABNORMAL LOW (ref 36.0–46.0)
HEMOGLOBIN: 11.1 g/dL — ABNORMAL LOW (ref 12.0–16.0)
MEAN CORPUSCULAR HEMOGLOBIN: 29.1 pg (ref 26.0–34.0)
MEAN CORPUSCULAR VOLUME: 94 fL (ref 80.0–100.0)
MEAN PLATELET VOLUME: 8.5 fL (ref 7.0–10.0)
PLATELET COUNT: 390 10*9/L (ref 150–440)
WBC ADJUSTED: 10.6 10*9/L (ref 4.5–11.0)

## 2020-07-07 LAB — ENDOTOOL
ENDOTOOL GLUCOSE: 214 mg/dL — ABNORMAL HIGH (ref 110–140)
ENDOTOOL INSULIN RATE: 11.5 U/h
ENDOTOOL INSULIN RATE: 11.5 U/h
ENDOTOOL INSULIN RATE: 9.5 U/h
ENDOTOOL INSULIN RATE: 1.8 U/h
ENDOTOOL INSULIN RATE: 3 U/h
ENDOTOOL INSULIN RATE: 3.8 U/h
ENDOTOOL INSULIN RATE: 8.5 U/h

## 2020-07-07 LAB — PHOSPHORUS
Phosphate:MCnc:Pt:Ser/Plas:Qn:: 4.4
Phosphate:MCnc:Pt:Ser/Plas:Qn:: 5.4 — ABNORMAL HIGH

## 2020-07-07 LAB — ENDOTOOL INSULIN RATE
Lab: 1.8
Lab: 11.5
Lab: 9.5
Lab: 9.5

## 2020-07-07 LAB — ENDOTOOL GLUCOSE
Lab: 180 — ABNORMAL HIGH
Lab: 209 — ABNORMAL HIGH

## 2020-07-07 LAB — SODIUM WHOLE BLOOD: Sodium:SCnc:Pt:Bld:Qn:: 139

## 2020-07-07 LAB — POTASSIUM: Potassium:SCnc:Pt:Ser/Plas:Qn:: 4.8 — ABNORMAL HIGH

## 2020-07-07 LAB — BUN / CREAT RATIO: Urea nitrogen/Creatinine:MRto:Pt:Ser/Plas:Qn:: 29

## 2020-07-07 LAB — FIO2 ARTERIAL

## 2020-07-07 LAB — TACROLIMUS, TROUGH: Lab: 9.5

## 2020-07-07 LAB — PROTIME: Coagulation tissue factor induced:Time:Pt:PPP:Qn:Coag: 12.9

## 2020-07-07 LAB — BASE EXCESS ARTERIAL: Base excess:SCnc:Pt:BldA:Qn:Calculated: -7 — ABNORMAL LOW

## 2020-07-07 LAB — O2 SATURATION ARTERIAL: Oxygen saturation:MFr:Pt:BldA:Qn:: 98.5

## 2020-07-07 LAB — EGFR CKD-EPI NON-AA FEMALE
Glomerular filtration rate/1.73 sq M.predicted.non black:ArVRat:Pt:Ser/Plas/Bld:Qn:Creatinine-based formula (CKD-EPI): 16 — ABNORMAL LOW

## 2020-07-07 LAB — APTT: Coagulation surface induced:Time:Pt:PPP:Qn:Coag: 27.2

## 2020-07-07 LAB — MAGNESIUM
Magnesium:MCnc:Pt:Ser/Plas:Qn:: 2.6
Magnesium:MCnc:Pt:Ser/Plas:Qn:: 2.6

## 2020-07-07 LAB — PH ARTERIAL: pH:LsCnc:Pt:BldA:Qn:: 7.17 — CL

## 2020-07-07 LAB — HEMOGLOBIN: Hemoglobin:MCnc:Pt:Bld:Qn:: 11.1 — ABNORMAL LOW

## 2020-07-07 LAB — D-DIMER QUANTITATIVE (CW,ML,HL): Lab: 1524 — ABNORMAL HIGH

## 2020-07-07 LAB — ALBUMIN: Albumin:MCnc:Pt:Ser/Plas:Qn:: 1.8 — ABNORMAL LOW

## 2020-07-07 LAB — HEMOGLOBIN BLOOD GAS: Hemoglobin:MCnc:Pt:Bld:Qn:: 11 — ABNORMAL LOW

## 2020-07-07 LAB — CULTURE, BLOOD (ROUTINE X 2)
Culture: NO GROWTH
Culture: NO GROWTH
Special Requests: ADEQUATE
Special Requests: ADEQUATE

## 2020-07-07 MED ADMIN — LORazepam (ATIVAN) tablet 4 mg: 4 mg | GASTROENTERAL | @ 03:00:00

## 2020-07-07 MED ADMIN — LORazepam (ATIVAN) tablet 4 mg: 4 mg | GASTROENTERAL | @ 16:00:00

## 2020-07-07 MED ADMIN — heparin (porcine) 7,500 units/0.75 mL syringe: 7500 [IU] | SUBCUTANEOUS | @ 04:00:00

## 2020-07-07 MED ADMIN — dextrose 50 % solution: INTRAVENOUS | @ 07:00:00 | Stop: 2020-07-07

## 2020-07-07 MED ADMIN — polyethylene glycol (MIRALAX) packet 17 g: 17 g | GASTROENTERAL | @ 03:00:00

## 2020-07-07 MED ADMIN — aspirin chewable tablet 81 mg: 81 mg | GASTROENTERAL | @ 13:00:00

## 2020-07-07 MED ADMIN — chlorhexidine (PERIDEX) 0.12 % solution 5 mL: 5 mL | OROMUCOSAL | @ 13:00:00

## 2020-07-07 MED ADMIN — oxyCODONE (ROXICODONE) immediate release tablet 30 mg: 30 mg | GASTROENTERAL | @ 03:00:00 | Stop: 2020-07-17

## 2020-07-07 MED ADMIN — polyethylene glycol (MIRALAX) packet 17 g: 17 g | GASTROENTERAL | @ 17:00:00

## 2020-07-07 MED ADMIN — insulin regular (HumuLIN,NovoLIN) injection 10 Units: 10 [IU] | INTRAVENOUS | @ 07:00:00 | Stop: 2020-07-07

## 2020-07-07 MED ADMIN — midazolam in sodium chloride 0.9% (1 mg/mL) infusion PMB: 0-6 mg/h | INTRAVENOUS | @ 10:00:00

## 2020-07-07 MED ADMIN — heparin (porcine) 7,500 units/0.75 mL syringe: 7500 [IU] | SUBCUTANEOUS | @ 17:00:00

## 2020-07-07 MED ADMIN — oxyCODONE (ROXICODONE) immediate release tablet 30 mg: 30 mg | GASTROENTERAL | @ 09:00:00 | Stop: 2020-07-17

## 2020-07-07 MED ADMIN — HYDROmorphone 1 mg/mL in 0.9% sodium chloride: 0-10 mg/h | INTRAVENOUS | @ 10:00:00 | Stop: 2020-07-16

## 2020-07-07 MED ADMIN — HYDROmorphone (PF) (DILAUDID) injection 4 mg: 4 mg | INTRAVENOUS | @ 15:00:00

## 2020-07-07 MED ADMIN — insulin NPH (HumuLIN,NovoLIN) injection 14 Units: 14 [IU] | SUBCUTANEOUS | @ 09:00:00 | Stop: 2020-07-07

## 2020-07-07 MED ADMIN — dextrose 50 % in water (D50W) 50 % solution 25 g: 25 g | INTRAVENOUS | @ 07:00:00 | Stop: 2020-07-07

## 2020-07-07 MED ADMIN — midazolam (VERSED) injection 5 mg: 5 mg | INTRAVENOUS | @ 01:00:00

## 2020-07-07 MED ADMIN — LORazepam (ATIVAN) tablet 4 mg: 4 mg | GASTROENTERAL | @ 13:00:00

## 2020-07-07 MED ADMIN — chlorhexidine (PERIDEX) 0.12 % solution 5 mL: 5 mL | OROMUCOSAL

## 2020-07-07 MED ADMIN — insulin regular (HumuLIN,NovoLIN) injection 0-12 Units: 0-12 [IU] | SUBCUTANEOUS | @ 05:00:00 | Stop: 2020-07-07

## 2020-07-07 MED ADMIN — midazolam (VERSED) injection 5 mg: 5 mg | INTRAVENOUS | @ 15:00:00

## 2020-07-07 MED ADMIN — tacrolimus (PROGRAF) oral suspension: 1 mg | GASTROENTERAL | @ 03:00:00

## 2020-07-07 MED ADMIN — HYDROmorphone (PF) (DILAUDID) injection 4 mg: 4 mg | INTRAVENOUS | @ 22:00:00

## 2020-07-07 MED ADMIN — levothyroxine (SYNTHROID) tablet 100 mcg: 100 ug | GASTROENTERAL | @ 09:00:00

## 2020-07-07 MED ADMIN — oxyCODONE (ROXICODONE) immediate release tablet 30 mg: 30 mg | GASTROENTERAL | @ 20:00:00 | Stop: 2020-07-17

## 2020-07-07 MED ADMIN — VECuronium (NORCURON) injection 10 mg: 10 mg | INTRAVENOUS | @ 02:00:00 | Stop: 2020-07-06

## 2020-07-07 MED ADMIN — HYDROmorphone 1 mg/mL in 0.9% sodium chloride: 0-10 mg/h | INTRAVENOUS | @ 05:00:00 | Stop: 2020-07-16

## 2020-07-07 MED ADMIN — HYDROmorphone 1 mg/mL in 0.9% sodium chloride: 0-10 mg/h | INTRAVENOUS | @ 20:00:00 | Stop: 2020-07-16

## 2020-07-07 MED ADMIN — insulin regular (HumuLIN,NovoLIN) injection 0-12 Units: 0-12 [IU] | SUBCUTANEOUS | @ 10:00:00 | Stop: 2020-07-07

## 2020-07-07 MED ADMIN — senna (SENOKOT) tablet 2 tablet: 2 | GASTROENTERAL | @ 04:00:00

## 2020-07-07 MED ADMIN — furosemide (LASIX) 120 mg in sodium chloride (NS) 0.9 % 50 mL IVPB: 120 mg | INTRAVENOUS | @ 16:00:00 | Stop: 2020-07-07

## 2020-07-07 MED ADMIN — insulin regular (HumuLIN,NovoLIN) 100 Units in sodium chloride (NS) 0.9 % 100 mL infusion: 0-50 [IU]/h | INTRAVENOUS | @ 14:00:00

## 2020-07-07 MED ADMIN — ceFAZolin (ANCEF) IVPB 2 g in 50 ml dextrose (premix): 2 g | INTRAVENOUS | @ 13:00:00 | Stop: 2020-07-07

## 2020-07-07 MED ADMIN — HYDROmorphone (PF) (DILAUDID) injection 4 mg: 4 mg | INTRAVENOUS | @ 10:00:00

## 2020-07-07 MED ADMIN — furosemide (LASIX) 120 mg in sodium chloride (NS) 0.9 % 50 mL IVPB: 120 mg | INTRAVENOUS | @ 23:00:00 | Stop: 2020-07-07

## 2020-07-07 MED ADMIN — vancomycin (VANCOCIN) IVPB 1000 mg (premix): 1000 mg | INTRAVENOUS | @ 06:00:00 | Stop: 2020-07-07

## 2020-07-07 MED ADMIN — pravastatin (PRAVACHOL) tablet 40 mg: 40 mg | GASTROENTERAL | @ 13:00:00

## 2020-07-07 MED ADMIN — polyethylene glycol (MIRALAX) packet 17 g: 17 g | GASTROENTERAL | @ 13:00:00

## 2020-07-07 MED ADMIN — norepinephrine 8 mg in sodium chloride 0.9 % 250 mL (32mcg/mL) infusion PMB: 0-30 ug/min | INTRAVENOUS | @ 07:00:00

## 2020-07-07 NOTE — Unmapped (Deleted)
Tacrolimus Therapeutic Monitoring Pharmacy Note    Evelyn Tobey is a 54 y.o. female continuing tacrolimus.     Indication: Liver transplant     Date of Transplant: 2016      Prior Dosing Information: Home regimen  tacrolimus 2mg  am and 1mg  pm     Goals:  Therapeutic Drug Levels  Tacrolimus trough goal: 3-5 ng/mL per Consult note 07/03/20    Additional Clinical Monitoring/Outcomes  ?? Monitor renal function (SCr and urine output) and liver function (LFTs)  ?? Monitor for signs/symptoms of adverse events (e.g., hyperglycemia, hyperkalemia, hypomagnesemia, hypertension, headache, tremor)    Results:   Tacrolimus level: 9.5 ng/mL, drawn appropriately    Pharmacokinetic Considerations and Significant Drug Interactions:  ??? Concurrent hepatotoxic medications: none identified at time of note  ??? Concurrent CYP3A4 substrates/inhibitors: none identified at time of note  ??? Concurrent nephrotoxic medications: none identified at time of note    Assessment/Plan:  UNLESS INDICATED OTHERWISE BY TRANSPLANT TEAM    Recommendation(s)  ??? HOLD tac until levels downtrend   ??? Will continue to watch levels daily\    Follow-up  ??? Daily with AM labs (between 0600-0800, within 1h preferred) PRIOR to morning dose .   ??? A pharmacist will continue to monitor and recommend levels as appropriate    Longitudinal Dose Monitoring:  Date Dose (mg), Route AM Scr (mg/dL) Level  (ng/mL) Key Drug Interactions   07/28 HOLD +       07/27 2 NG + HOLD 3.27 9.5 (0840)    07/26 2+1 NG 2.50 5.8 (0650)    07/25 2+1 NG 1.38 3.5 (1610)    07/24 2+1 NG 1.52 2.3 (9604)    07/23 1mg  NG 1.3 1.8 (5409)         Please page service pharmacist with questions/clarifications.    Charleen Kirks, PharmD

## 2020-07-07 NOTE — Unmapped (Signed)
Patient went to MRI, received Vec push, dilaudid and versed prior. Potassium elevated and treated per order. Desated at 0600 when being cleaned, vent settings adjusted by RT.  Problem: Adult Inpatient Plan of Care  Goal: Plan of Care Review  Outcome: Ongoing - Unchanged  Goal: Patient-Specific Goal (Individualization)  Outcome: Ongoing - Unchanged  Goal: Absence of Hospital-Acquired Illness or Injury  Outcome: Ongoing - Unchanged  Goal: Optimal Comfort and Wellbeing  Outcome: Ongoing - Unchanged  Goal: Readiness for Transition of Care  Outcome: Ongoing - Unchanged  Goal: Rounds/Family Conference  Outcome: Ongoing - Unchanged     Problem: Skin Injury Risk Increased  Goal: Skin Health and Integrity  Outcome: Ongoing - Unchanged     Problem: Non-Violent Restraints  Goal: Patient will remain free of restraint events  Outcome: Ongoing - Unchanged  Goal: Patient will remain free of physical injury  Outcome: Ongoing - Unchanged     Problem: Infection  Goal: Infection Symptom Resolution  Outcome: Ongoing - Unchanged     Problem: Fall Injury Risk  Goal: Absence of Fall and Fall-Related Injury  Outcome: Ongoing - Unchanged     Problem: Wound  Goal: Optimal Wound Healing  Outcome: Ongoing - Unchanged     Problem: Communication Impairment (Mechanical Ventilation, Invasive)  Goal: Effective Communication  Outcome: Ongoing - Unchanged     Problem: Device-Related Complication Risk (Mechanical Ventilation, Invasive)  Goal: Optimal Device Function  Outcome: Ongoing - Unchanged     Problem: Inability to Wean (Mechanical Ventilation, Invasive)  Goal: Mechanical Ventilation Liberation  Outcome: Ongoing - Unchanged     Problem: Skin and Tissue Injury (Mechanical Ventilation, Invasive)  Goal: Absence of Device-Related Skin and Tissue Injury  Outcome: Ongoing - Unchanged     Problem: Ventilator-Induced Lung Injury (Mechanical Ventilation, Invasive)  Goal: Absence of Ventilator-Induced Lung Injury  Outcome: Ongoing - Unchanged     Problem: Self-Care Deficit  Goal: Improved Ability to Complete Activities of Daily Living  Outcome: Ongoing - Unchanged     Problem: Asthma Comorbidity  Goal: Maintenance of Asthma Control  Outcome: Ongoing - Unchanged     Problem: COPD Comorbidity  Goal: Maintenance of COPD Symptom Control  Outcome: Ongoing - Unchanged     Problem: Diabetes Comorbidity  Goal: Blood Glucose Level Within Desired Range  Outcome: Ongoing - Unchanged     Problem: Heart Failure Comorbidity  Goal: Maintenance of Heart Failure Symptom Control  Outcome: Ongoing - Unchanged     Problem: Hypertension Comorbidity  Goal: Blood Pressure in Desired Range  Outcome: Ongoing - Unchanged     Problem: Obstructive Sleep Apnea Risk or Actual (Comorbidity Management)  Goal: Unobstructed Breathing During Sleep  Outcome: Ongoing - Unchanged     Problem: Pain Chronic (Persistent) (Comorbidity Management)  Goal: Acceptable Pain Control and Functional Ability  Outcome: Ongoing - Unchanged     Problem: Seizure Disorder Comorbidity  Goal: Maintenance of Seizure Control  Outcome: Ongoing - Unchanged     Problem: LTC COVID-19 Confirmed or Rule-Out  Goal: Patient/Resident will remain free of complications due to COVID-19  Description: 1. Review and update the patient/resident's isolation status in the Isolation activity  2. Keep the patient/resident's door closed at all times and limit movement of the patient/resident outside of the room to medically essential purposes. If applicable, transfer patient/resident to a single-person room   3. Educate and reinforce infection prevention and control practices recommended by CDC  4. Frequently monitor for development of more severe symptoms   5. Use appropriate  PPE when providing care for patient/resident  6. Reinforce no visitor policy and non-essential health care personnel policy, except for certain compassionate care situations  7. If worsening of symptoms occur, alert the nearest Hospital caring for confirmed COVID-19 patients and arrange for transfer with proper precautions including placing a facemask on the patient/resident during transfer  8. Communicate information about known or suspected case of COVID-19 to appropriate public health personnel  9. Avoid procedures that are likely to induce coughing (e.g., sputum induction, open suctioning of airways). If required, do so in an Airborne Infection Isolation Room. The health care provider in the room should wear an N95 or higher-level respirator, eye protection, gloves, and a gown. The number of HCP present during the procedure should be limited to only those essential for patient/resident care and procedure support. Visitors should not be present for the procedure. Clean and disinfect procedure room surfaces promptly  10. Update patient/resident and family/representatives as needed      Outcome: Ongoing - Unchanged

## 2020-07-07 NOTE — Unmapped (Addendum)
HEPATOLOGY TREATMENT PLAN NOTE    Ms. Evelyn Rollins is a 54 year old female, s/p OLT in 2016 2/2 NAFLD. Admitted with COVID infection.    Tacrolimus trough was elevated at 9.5. Per Epic, level was drawn at 0840. Per MAR, drug given at 0822, but in talking with nurse, tac level was drawn prior to giving med. Would give tac 0.5 this evening, and then switch to 1 q AM, 0.5 q PM    Continue daily troughs and daily LFTs.    Thank you for this consult.  Please page [phone] with questions.    Ace Gins, NP-C  Transplant, Hepatology

## 2020-07-07 NOTE — Unmapped (Signed)
Problem: Adult Inpatient Plan of Care  Goal: Plan of Care Review  07/07/2020 0741 by Ruby Cola, RN BSN  Outcome: Progressing  07/07/2020 0733 by Ruby Cola, RN BSN  Outcome: Ongoing - Unchanged  Goal: Patient-Specific Goal (Individualization)  07/07/2020 0741 by Ruby Cola, RN BSN  Outcome: Progressing  07/07/2020 0733 by Ruby Cola, RN BSN  Outcome: Ongoing - Unchanged  Goal: Absence of Hospital-Acquired Illness or Injury  07/07/2020 0741 by Ruby Cola, RN BSN  Outcome: Progressing  07/07/2020 0733 by Ruby Cola, RN BSN  Outcome: Ongoing - Unchanged  Goal: Optimal Comfort and Wellbeing  07/07/2020 0741 by Ruby Cola, RN BSN  Outcome: Progressing  07/07/2020 0733 by Ruby Cola, RN BSN  Outcome: Ongoing - Unchanged  Goal: Readiness for Transition of Care  07/07/2020 0741 by Ruby Cola, RN BSN  Outcome: Progressing  07/07/2020 0733 by Ruby Cola, RN BSN  Outcome: Ongoing - Unchanged  Goal: Rounds/Family Conference  07/07/2020 0741 by Ruby Cola, RN BSN  Outcome: Progressing  07/07/2020 0733 by Ruby Cola, RN BSN  Outcome: Ongoing - Unchanged     Problem: Infection  Goal: Infection Symptom Resolution  07/07/2020 0741 by Ruby Cola, RN BSN  Outcome: Progressing  07/07/2020 0733 by Ruby Cola, RN BSN  Outcome: Ongoing - Unchanged     Problem: Fall Injury Risk  Goal: Absence of Fall and Fall-Related Injury  07/07/2020 0741 by Ruby Cola, RN BSN  Outcome: Progressing  07/07/2020 0733 by Ruby Cola, RN BSN  Outcome: Ongoing - Unchanged     Problem: Communication Impairment (Mechanical Ventilation, Invasive)  Goal: Effective Communication  07/07/2020 0741 by Ruby Cola, RN BSN  Outcome: Progressing  07/07/2020 0733 by Ruby Cola, RN BSN  Outcome: Ongoing - Unchanged     Problem: Device-Related Complication Risk (Mechanical Ventilation, Invasive)  Goal: Optimal Device Function  07/07/2020 0741 by Ruby Cola, RN BSN  Outcome: Progressing  07/07/2020 0733 by Ruby Cola, RN BSN  Outcome: Ongoing - Unchanged     Problem: Skin and Tissue Injury (Mechanical Ventilation, Invasive)  Goal: Absence of Device-Related Skin and Tissue Injury  07/07/2020 0741 by Ruby Cola, RN BSN  Outcome: Progressing  07/07/2020 0733 by Ruby Cola, RN BSN  Outcome: Ongoing - Unchanged     Problem: Ventilator-Induced Lung Injury (Mechanical Ventilation, Invasive)  Goal: Absence of Ventilator-Induced Lung Injury  07/07/2020 0741 by Ruby Cola, RN BSN  Outcome: Progressing  07/07/2020 0733 by Ruby Cola, RN BSN  Outcome: Ongoing - Unchanged     Problem: COPD Comorbidity  Goal: Maintenance of COPD Symptom Control  07/07/2020 0741 by Ruby Cola, RN BSN  Outcome: Progressing  07/07/2020 0733 by Ruby Cola, RN BSN  Outcome: Ongoing - Unchanged     Problem: Diabetes Comorbidity  Goal: Blood Glucose Level Within Desired Range  07/07/2020 0741 by Ruby Cola, RN BSN  Outcome: Progressing  07/07/2020 0733 by Ruby Cola, RN BSN  Outcome: Ongoing - Unchanged     Problem: Obstructive Sleep Apnea Risk or Actual (Comorbidity Management)  Goal: Unobstructed Breathing During Sleep  07/07/2020 0741 by Ruby Cola, RN BSN  Outcome: Progressing  07/07/2020 0733 by Ruby Cola, RN BSN  Outcome: Ongoing - Unchanged     Problem: Pain Chronic (Persistent) (Comorbidity Management)  Goal: Acceptable Pain Control and Functional Ability  07/07/2020 0741 by Ruby Cola, RN BSN  Outcome: Progressing  07/07/2020 0733 by Ruby Cola, RN BSN  Outcome: Ongoing - Unchanged     Problem: Non-Violent Restraints  Goal: Patient will remain  free of restraint events  07/07/2020 0741 by Ruby Cola, RN BSN  Outcome: Progressing  07/07/2020 0733 by Ruby Cola, RN BSN  Outcome: Ongoing - Unchanged  Goal: Patient will remain free of physical injury  07/07/2020 0741 by Ruby Cola, RN BSN  Outcome: Progressing 07/07/2020 0733 by Ruby Cola, RN BSN  Outcome: Ongoing - Unchanged     Problem: Infection  Goal: Infection Symptom Resolution  07/07/2020 0741 by Ruby Cola, RN BSN  Outcome: Progressing  07/07/2020 0733 by Ruby Cola, RN BSN  Outcome: Ongoing - Unchanged

## 2020-07-07 NOTE — Unmapped (Signed)
MICU Daily Progress Note     Date of Service: 07/06/2020    Problem List:   Active Problems:    Liver transplant recipient (CMS-HCC)    Morbid obesity with BMI of 40.0-44.9, adult (CMS-HCC)    Acute hypoxemic respiratory failure due to severe acute respiratory syndrome coronavirus 2 (SARS-CoV-2) disease (CMS-HCC)  Resolved Problems:    * No resolved hospital problems. *      Interval history:  Evelyn Rollins is a 54 y.o. female with PMH cirrhosis status post liver transplant 2016 on chronic immunosuppressive therapy, hypertension, diabetes, hypothyroidism who presented to [facility] on 7/22 with myalgias, sore throat, loss of taste and fevers  x5 days. When her symptoms had started, she took a home Covid test on 7/17 which was found to be positive. She has had poor p.o. intake for the last several days. Her husband also has similar symptoms. In the OSH ED she was found to be severely hypoxic with O2 saturations of 50% on room air. She was placed on a nonrebreather and current saturations increased to the low 90s. Chest x-ray shows bilateral infiltrates consistent with COVID-19 pneumonia. Her Covid test is positive. Inflammatory markers are elevated. She received a dose of Decadron in the emergency room.    24hr events:   - low uop  - unable to lay flat for MRI last night due to desat     Neurological   Analgesia and Sedation  -dilaudid and versed gtt >> plan to wean dilaudid first as tol  - goal RASS 3- to 4-  -PRN dilaudid and versed  -ativan 2q4h, oxy 20q4h >> inc to 4q4 and 30 q4  - plan for vec bolus for MRI  ??  Anxiety, depression  -holding home meds abilify, wellbutrin and lexapro; ambien and ativan prn  - cont to hold lexapro and wellbutrin d/t c/f and risk of serotonin syndrome while on reglan  ??  Seizure history (not taking AEDs), ?seizure activity  -neurology consult >> c/f RUE weakness, rec MRI or CT when stable enough to transport  -vEEG negative for seizure x 48hrs >> discont monitoring per neuro 7/25      Pulmonary   #ARDS, Acute resp failure r/t COVID  - tolerating PRVC well  - cont lung protective ventilation strategies (39ml/kg, permissive hypercapnea)  - desat to 80's when laying flat last night.  Increased PEEP to 20, which improved her oxgenation and did not change her DP's (10-11).  Was able to tolerate laying flat.      Cardiovascular   h/o HTN   -holding home antihtn  - previously on levo for MAP >65  - weaned off levo since 7/24  - last echo 2016 w nml LVEF, enlrgd RV and deprssed RV fxn w mild phtn  - consider formal echo when supine    Renal   AKI  - oliguric >> fluid bolus, no improvement   - cont foley for accurate I/O during critical illness   - goal euvolemia   - replete electrolytes prn and monitor daily      Infectious Disease/Autoimmune   #COVID 19 infection  Symptom onset:??7/17  Exposure: unknown  COVID PCR+: 7/17  OSH Admission:??7/22  Day of admission/transfer: 7/22  OSH Meds Given:??dexamethasone  COVID Specific??Meds:??dexamethasone  Vaccination status: not vaccinated  ??  Monitoring:  - Special airborne/contact precautions (If unavailable, droplet & contact precautions)  - f/u daily CMP, CBC w/ diff, CRP, D-dimer, DIC, troponin, pro-BNP with weekly ferritin, LDH and cytokine level  -  f/u q6h lactate; ABG  ??  Medications:  - completed remdesivir  - cont decadron 6mg  po  daily x 10d, started 7/22  ??  #CAP/VAP  - febrile, leukocytosis   - started cefep/vanc 7/25  - pan cx for T>38.2   - f/u cx data     1/2 staph epi bactermia:  - will repeat blood cultures today  - f/u ID recs     Cultures:  Blood Culture, Routine (no units)   Date Value   07/04/2020 No Growth at 48 hours   07/04/2020 Staphylococcus epidermidis (A)     Lower Respiratory Culture (no units)   Date Value   07/04/2020 3+ Methicillin-Susceptible Staphylococcus aureus (A)     WBC (10*9/L)   Date Value   07/06/2020 9.4     WBC, UA (/HPF)   Date Value   07/04/2020 3          FEN/GI   -trickle tube feeds >> intmt high residuals  - started reglan x 24hrs  -bowel regimen w senna and miralax  -PPI  - cont pravachol per home regimen  ??    Malnutrition Assessment: Not done yet.     Malnutrition Assessment: Not done yet.  #Immunocompromised s/p Liver transplant in 2016  -continue tacrolimus  -HOLD cellcept    Heme/Coag   Hypercoagulable state 2/2 covid  -anticoagulation group C, lovenox  -LE dopplers neg for DVT 7/24  - no evid bleeding    Endocrine   History of DM2  -ISS  ??    Integumentary     #  - WOCN consulted for high risk skin assessment Yes.  - WOCN recs >> pending, ordered 7/25   - cont pressure mitigating precautions per skin policy    Prophylaxis/LDA/Restraints/Consults   Can CVC be removed? No: need for medications requiring central access (e.g. pressors)   Can A-line be removed? No: frequent ABGs  Can Foley be removed? No: Need continuous I/O  Mobility plan: Step 1 - Range of motion    Feeding: Trickle feeds, advance as tolerated  Analgesia: No pain issues  Sedation SAT/SBT: No PEEP > 8  Thromboembolic ppx: Enoxaparin  Head of bed >30 degrees: Yes  Ulcer ppx: Yes, coagulopathy  Glucose within target range: Yes, in range    Does patient need/have an active type/screen? NA    RASS at goal? Yes  [place] Agitation Assessment Scale (RASS) : -3 (07/06/2020  4:00 PM)     Can antipsychotics be stopped? N/A, not on antipsychotics  CAM-ICU Result: Positive (07/06/2020  4:00 PM)      Would hospice care be appropriate for this patient? No, patient improving or expected to improve    Patient Lines/Drains/Airways Status    Active Active Lines, Drains, & Airways     Name:   Placement date:   Placement time:   Site:   Days:    ETT  7.5   06/29/2020    1700     4    CVC Triple Lumen 06/14/2020 Non-tunneled Right Internal jugular   06/23/2020    1945    Internal jugular   3    NG/OG Tube Decompression;Feedings Center mouth   07/03/20    0610    Center mouth   3    Urethral Catheter   06/22/2020    ???    ???   4    Peripheral IV 07/03/2020 Left Antecubital   06/14/2020 1300    Antecubital   4  Arterial Line 07-12-20 Right Radial   July 12, 2020    2315    Radial   3              Patient Lines/Drains/Airways Status    Active Wounds     None                Goals of Care     Code Status: Full Code    Designated Healthcare Decision Maker:  Ms. Tschantz current decisional capacity for healthcare decision-making is incapacitated. Her designated Educational psychologist) is/are her husband .      Subjective     Intubated, sedated    Objective     Vitals - past 24 hours  Temp:  [37.2 ??C (98.9 ??F)] 37.2 ??C (98.9 ??F)  Heart Rate:  [64-88] 72  SpO2 Pulse:  [65-87] 74  Resp:  [24-32] 28  FiO2 (%):  [50 %-100 %] 60 %  SpO2:  [85 %-99 %] 94 % Intake/Output  I/O last 3 completed shifts:  In: 3168.9 [I.V.:493.9; NG/GT:1780; IV Piggyback:895]  Out: 985 [Urine:985]     ??  Physical Exam:   General: NAD, well nourished, obese, intubated, sedated   HEENT:  normocephalic, trachea mdl, anicteric, no scleral edema, no conjuctival erythema/drainage, MMM and pink   CV: RRR. S1 and S2 normal, no m/r/c/g. no bruit, or JVD. DP Pulses 2  equal. 1+ BLE edema.  Lungs:   chest rise symm, diminished bases. CTA bilat, no wheezes/crackles/rhonchi. Good air movement.  Skin:   w/d/i, no jaundice present, no rashes, lesions, petechiae or breakdown. no drng, erythema.    Abd: contour, abdomen soft, non-tender and not distended. Normoactive bowel sounds x 4 quads, no rebound tenderness or guarding.   Ext: No cyanosis, bruising, clubbing or edema.   Neuro: perrl 3Sl, mdl, orbital edema, +c/c/g, intmt truncal 2-3 beat clonus w stim (no assoc w sz on eeg),       Continuous Infusions:   ??? HYDROmorphone 8 mg/hr (07/06/20 0928)   ??? midazolam (1 mg/mL) infusion 6 mg/hr (07/06/20 0933)   ??? norepinephrine bitartrate-NS 1 mcg/min (07/06/20 0940)       Scheduled Medications:   ??? [START ON 07/07/2020] aspirin  81 mg Enteral tube: gastric  Daily   ??? Cefepime  1 g Intravenous Q12H   ??? chlorhexidine  5 mL Mouth BID   ??? dexamethasone 6 mg Intravenous Q24H   ??? famotidine  20 mg Enteral tube: gastric  Daily   ??? heparin (porcine) for subcutaneous use  7,500 Units Subcutaneous [facility]   ??? insulin NPH  10 Units Subcutaneous Q12H [facility]   ??? insulin regular  0-12 Units Subcutaneous Q6H [facility]   ??? levothyroxine  100 mcg Enteral tube: gastric  daily   ??? LORazepam  4 mg Enteral tube: gastric  Q4H   ??? oxyCODONE  30 mg Enteral tube: gastric  Q4H   ??? polyethylen glycol  17 g Enteral tube: gastric  TID   ??? [START ON 07/07/2020] pravastatin  40 mg Enteral tube: gastric  Daily   ??? senna  2 tablet Enteral tube: gastric  BID   ??? Tacrolimus  2 mg Enteral tube: gastric  Daily   ??? Tacrolimus  1 mg Enteral tube: gastric  Nightly (2000)   ??? [START ON 07/07/2020] vancomycin  1,000 mg Intravenous Q24H   ??? VECuronium           PRN medications:  dextrose 50 % in water (D50W), HYDROmorphone, midazolam  Data/Imaging Review: Reviewed in Epic and personally interpreted on 07/06/2020. See EMR for detailed results.      Critical Care Attestation     This patient is critically ill or injured with the impairment of vital organ systems such that there is a high probability of imminent or life threatening deterioration in the patient's condition. This patient must remain in the ICU for ongoing evaluation of the comprehensive management plan outlined in this note. I directly provided critical care services as documented in this note and the critical care time spent (45 min) is exclusive of separately billable procedures.    Ziyon Soltau Paulino Door, PA

## 2020-07-07 NOTE — Unmapped (Signed)
MICU Daily Progress Note     Date of Service: 07/07/2020    Problem List:   Active Problems:    Liver transplant recipient (CMS-HCC)    Morbid obesity with BMI of 40.0-44.9, adult (CMS-HCC)    Acute hypoxemic respiratory failure due to severe acute respiratory syndrome coronavirus 2 (SARS-CoV-2) disease (CMS-HCC)  Resolved Problems:    * No resolved hospital problems. *      Interval history:  Evelyn Rollins is a 54 y.o. female with PMH cirrhosis status post liver transplant 2016 on chronic immunosuppressive therapy, hypertension, diabetes, hypothyroidism who presented to [facility] on 7/22 with myalgias, sore throat, loss of taste and fevers  x5 days. When her symptoms had started, she took a home Covid test on 7/17 which was found to be positive. She has had poor p.o. intake for the last several days. Her husband also has similar symptoms. In the OSH ED she was found to be severely hypoxic with O2 saturations of 50% on room air. She was placed on a nonrebreather and current saturations increased to the low 90s. Chest x-ray shows bilateral infiltrates consistent with COVID-19 pneumonia. Her Covid test is positive. Inflammatory markers are elevated. She received a dose of Decadron in the emergency room.    24hr events:   - MRI completed- no acute pathology   -worsening renal function      Neurological   Analgesia and Sedation  -dilaudid and versed gtt >> plan to wean dilaudid first as tol  - goal RASS 3- to 4-  -PRN dilaudid and versed  -ativan 2q4h, oxy 20q4h >> inc to 4q4 and 30 q4  ??  Anxiety, depression  -holding home meds abilify, wellbutrin and lexapro; ambien and ativan prn  - cont to hold lexapro and wellbutrin d/t c/f and risk of serotonin syndrome while on reglan  ??  Seizure history (not taking AEDs), ?seizure activity  -neurology consult >> c/f RUE weakness, MRI 7/27- no acute pathology  -vEEG negative for seizure x 48hrs >> discont monitoring per neuro 7/25      Pulmonary   #ARDS, Acute resp failure r/t COVID  - tolerating PRVC well  - cont lung protective ventilation strategies (22ml/kg, permissive hypercapnea)  - improved oxygenation with higher PEEP.  Remains compliant.      Cardiovascular   h/o HTN   -holding home antihtn  - previously on levo for MAP >65  - last echo 2016 w nml LVEF, enlrgd RV and deprssed RV fxn w mild phtn    Renal   AKI  - oliguric >> fluid bolus, no improvement   - cont foley for accurate I/O during critical illness   - goal euvolemia   - replete electrolytes prn and monitor daily  - nephrology consult: will give lasix trial with 120mg        Infectious Disease/Autoimmune   #COVID 19 infection  Symptom onset:??7/17  Exposure: unknown  COVID PCR+: 7/17  OSH Admission:??7/22  Day of admission/transfer: 7/22  OSH Meds Given:??dexamethasone  COVID Specific??Meds:??dexamethasone  Vaccination status: not vaccinated  ??  Monitoring:  - Special airborne/contact precautions (If unavailable, droplet & contact precautions)  - f/u daily CMP, CBC w/ diff, CRP, D-dimer, DIC, troponin, pro-BNP with weekly ferritin, LDH and cytokine level  - f/u q6h lactate; ABG  ??  Medications:  - completed remdesivir  - cont decadron 6mg  po  daily x 10d, started 7/22  ??  #CAP/VAP: MSSA   - febrile, leukocytosis   - started cefep/vanc 7/25-->  changed to ancef on 7/27  - pan cx for T>38.2   - f/u cx data     1/2 staph epi bactermia likely contaminant:  - will repeat blood cultures in progress   - f/u ID recs     Cultures:  Blood Culture, Routine (no units)   Date Value   07/04/2020 No Growth at 72 hours   07/04/2020 Staphylococcus epidermidis (A)   07/04/2020 Staphylococcus hominis (A)     Lower Respiratory Culture (no units)   Date Value   07/04/2020 3+ Methicillin-Susceptible Staphylococcus aureus (A)     WBC (10*9/L)   Date Value   07/07/2020 10.6     WBC, UA (/HPF)   Date Value   07/04/2020 3          FEN/GI   -trickle tube feeds >> intmt high residuals  - started reglan x 24hrs  -bowel regimen w senna and miralax  -PPI - cont pravachol per home regimen  ??    Malnutrition Assessment: Not done yet.     Malnutrition Assessment: Not done yet.  #Immunocompromised s/p Liver transplant in 2016  -continue tacrolimus- trough 9.5 this morning.  Will lower to 0.5mg  tonight and 1mg /0.5 mg tomorrow   -HOLD cellcept    Heme/Coag   Hypercoagulable state 2/2 covid  -anticoagulation group C, heparin tid   -LE dopplers neg for DVT 7/24  - no evid bleeding    Endocrine   History of DM2  -BG > 400.  Insulin gtt started   ??    Integumentary     #  - WOCN consulted for high risk skin assessment Yes.  - WOCN recs >> pending, ordered 7/25   - cont pressure mitigating precautions per skin policy    Prophylaxis/LDA/Restraints/Consults   Can CVC be removed? No: need for medications requiring central access (e.g. pressors)   Can A-line be removed? No: frequent ABGs  Can Foley be removed? No: Need continuous I/O  Mobility plan: Step 1 - Range of motion    Feeding: Trickle feeds, advance as tolerated  Analgesia: No pain issues  Sedation SAT/SBT: No PEEP > 8  Thromboembolic ppx: Enoxaparin  Head of bed >30 degrees: Yes  Ulcer ppx: Yes, coagulopathy  Glucose within target range: Yes, in range    Does patient need/have an active type/screen? NA    RASS at goal? Yes  [place] Agitation Assessment Scale (RASS) : -2 (07/07/2020 12:12 PM)     Can antipsychotics be stopped? N/A, not on antipsychotics  CAM-ICU Result: Positive (07/07/2020  8:00 AM)      Would hospice care be appropriate for this patient? No, patient improving or expected to improve    Patient Lines/Drains/Airways Status    Active Active Lines, Drains, & Airways     Name:   Placement date:   Placement time:   Site:   Days:    ETT  7.5   06/28/2020    1700     4    CVC Triple Lumen 06/17/2020 Non-tunneled Right Internal jugular   07/03/2020    1945    Internal jugular   4    NG/OG Tube Decompression;Feedings Center mouth   07/03/20    0610    Center mouth   4    Urethral Catheter   06/28/2020    ???    ???   5 Peripheral IV 06/23/2020 Left Antecubital   07/09/2020    1300    Antecubital   5  Arterial Line 07/12/20 Right Radial   07/12/20    2315    Radial   4              Patient Lines/Drains/Airways Status    Active Wounds     None                Goals of Care     Code Status: Full Code    Designated Healthcare Decision Maker:  Ms. Mattox current decisional capacity for healthcare decision-making is incapacitated. Her designated Educational psychologist) is/are her husband .      Subjective     Intubated, sedated    Objective     Vitals - past 24 hours  Temp:  [36.6 ??C (97.8 ??F)-37.2 ??C (98.9 ??F)] 36.6 ??C (97.8 ??F)  Heart Rate:  [61-96] 63  SpO2 Pulse:  [61-90] 63  Resp:  [22-30] 30  BP: (114-119)/(41-47) 114/45  FiO2 (%):  [60 %-100 %] 80 %  SpO2:  [76 %-99 %] 98 % Intake/Output  I/O last 3 completed shifts:  In: 4559.2 [I.V.:849.2; NG/GT:2110; IV Piggyback:1600]  Out: 1200 [Urine:1030; Emesis/NG output:170]     ??  Physical Exam:   General: NAD, well nourished, obese, intubated, sedated   HEENT:  normocephalic, trachea mdl, anicteric, no scleral edema, no conjuctival erythema/drainage, MMM and pink   CV: RRR. S1 and S2 normal, no m/r/c/g. no bruit, or JVD. DP Pulses 2  equal. 1+ BLE edema.  Lungs:   chest rise symm, diminished bases. CTA bilat, no wheezes/crackles/rhonchi. Good air movement.  Skin:   w/d/i, no jaundice present, no rashes, lesions, petechiae or breakdown. no drng, erythema.    Abd: contour, abdomen soft, non-tender and not distended. Normoactive bowel sounds x 4 quads, no rebound tenderness or guarding.   Ext: No cyanosis, bruising, clubbing or edema.   Neuro: perrl 3Sl, mdl, orbital edema, +c/c/g, intmt truncal 2-3 beat clonus w stim (no assoc w sz on eeg),       Continuous Infusions:   ??? HYDROmorphone 10 mg/hr (07/07/20 1212)   ??? insulin regular infusion 1 unit/mL 1.8 Units/hr (07/07/20 1318)   ??? midazolam (1 mg/mL) infusion 6 mg/hr (07/07/20 1212)   ??? norepinephrine bitartrate-NS 1.013 mcg/min (07/07/20 1212)       Scheduled Medications:   ??? aspirin  81 mg Enteral tube: gastric  Daily   ??? cefazolin  1 g Intravenous Q12H   ??? chlorhexidine  5 mL Mouth BID   ??? dexamethasone  6 mg Intravenous Q24H   ??? famotidine  20 mg Enteral tube: gastric  Daily   ??? heparin (porcine) for subcutaneous use  7,500 Units Subcutaneous [facility]   ??? levothyroxine  100 mcg Enteral tube: gastric  daily   ??? LORazepam  4 mg Enteral tube: gastric  Q4H   ??? oxyCODONE  30 mg Enteral tube: gastric  Q4H   ??? polyethylen glycol  17 g Enteral tube: gastric  TID   ??? pravastatin  40 mg Enteral tube: gastric  Daily   ??? senna  2 tablet Enteral tube: gastric  BID   ??? [START ON 07/08/2020] Tacrolimus  1 mg Enteral tube: gastric  Daily   ??? Tacrolimus  0.5 mg Enteral tube: gastric  Nightly (2000)       PRN medications:  dextrose 50 % in water (D50W), dextrose 50 % in water (D50W), HYDROmorphone, midazolam    Data/Imaging Review: Reviewed in Epic and personally interpreted on 07/07/2020. See EMR for detailed results.  Critical Care Attestation     This patient is critically ill or injured with the impairment of vital organ systems such that there is a high probability of imminent or life threatening deterioration in the patient's condition. This patient must remain in the ICU for ongoing evaluation of the comprehensive management plan outlined in this note. I directly provided critical care services as documented in this note and the critical care time spent (45 min) is exclusive of separately billable procedures.    Nasiyah Laverdiere Paulino Door, PA

## 2020-07-07 NOTE — Unmapped (Signed)
IMMUNOCOMPROMISED HOST INFECTIOUS DISEASE PROGRESS NOTE    Evelyn Rollins is being seen in consultation at the request of [name], * for evaluation of COVID-19 in transplant patient.    Assessment/Recommendations:    Evelyn Rollins is a 54 y.o. female with PMHx including NAFLD s/p OLT in 2016, DM2 not on insulin (unknown A1c), seizures x1 in remote past and morbid obesity who presented to an OSH on 7/22 with 7 days of symptoms and profound hypoxia due to critical COVID-19 ARDS requiring intubation on 7/22, now with c/f secondary bacterial PNA w/ MSSA.    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   - Surgical complications: biliary anastomosis leak with pelvic collection, s/p abx  - Serologies: CMV D-/R+, EBV D+/R+, Toxo D?/R-  - Induction: basiliximab  - Immunosuppression: myfortic, tac  - Prophylaxis: none    Pertinent Exposure History  Donor Infections: Donor death from drowning     Pertinent Co-morbidities  # Morbid obesity  # Hypothyroidism  # DM - unknown A1c  # Depression/anxiety  # Seizure x1 in remote past    Drug Intolerances - none     Infection History  # Critical SARS CoV-2 infection with ARDS requiring intubation and w/ c/f MSSA PNA (7/22) in unvaccinated patient, onset 06/24/20   -Known exposure(s): daughter, son-in-law on 7/10  -Date of symptom onset : 7/14  -Date of diagnostic test: 7/17 (home test), 7/22 at OSH  -Remdesivir administered: 7/22-   -Steroids administered: dexamethasone 7/22-  -CCP 1U or 2U administered: n/a  -Monoclonal antibody administered: n/a  -Tocilizumab administered: n/a  -07/04/20 Lower resp Cx: 3+ MSSA  -Abx: Cefepime & Vancomycin: 7/25-7/26, cefazolin 7/27-current    S Epi bacteremia (MRSE), S hominis 07/04/20 favored as contaminants  Favored as a contaminant as only growing 1 of 2 bottles from 7/24.  -07/04/20 BC 1 of 2 w/ MRSE  -07/06/20 BC x 2 pending       RECOMMENDATIONS FOR 07/07/2020    Diagnostic  ??? F/u 07/06/20 Blood cultures    Treatment ??? Agree dexamethasone 6mg  x10 days   ??? Agree with remdesivir x5 days  ??? OK to continue cefazolin for secondary MSSA PNA, consider 14d of tx given immunosuppressed and critically ill.          The [facility] ID service will continue to follow from afar. Please page the ID Transplant/Liquid Oncology Fellow consult at [phone] with questions. Patient discussed with Dr. Mila Homer.    [name], MD, MPH  Fellow, [facility] Infectious Diseases    Interim History:      Source of information includes:  Electronic Medical Records.  History obtained from:chart     Patient has remained afebrile, on NE with FiO2 100 peep of 18. Cultures from 7/24 also with S hominis (from the same bottle growing S epi). 7/26 cultures NGTD. Cr increasing.    Allergies:  No Known Allergies    Medications:   Antimicrobials:  Anti-infectives (From admission, onward)    Start     Dose/Rate Route Frequency Ordered Stop    07/07/20 2100  ceFAZolin (ANCEF) IVPB 1 g (premix)      1 g  100 mL/hr over 30 Minutes Intravenous Every 12 hours 07/07/20 0806 07/12/20 2059    07/07/20 0900  ceFAZolin (ANCEF) IVPB 2 g in 50 ml dextrose (premix)      2 g  100 mL/hr over 30 Minutes Intravenous Once 07/07/20 0806  Current/Prior immunomodulators:  Dexamethasone 6mg  7/22-   Myfortic  Tac    Other medications reviewed.     Review of Systems:  All other systems reviewed are negative.        Vital Signs last 24 hours:  Temp:  [36.7 ??C-37.2 ??C] 36.7 ??C  Core Temp:  [36.1 ??C-37.3 ??C] 36.2 ??C  Heart Rate:  [61-96] 64  SpO2 Pulse:  [61-90] 64  Resp:  [22-31] 30  BP: (114-119)/(41-47) 114/45  MAP (mmHg):  [67-71] 68  A BP-2: (108-151)/(43-66) 122/58  MAP:  [61 mmHg-90 mmHg] 75 mmHg  FiO2 (%):  [60 %-100 %] 100 %  SpO2:  [76 %-99 %] 99 %  BMI (Calculated):  [42.25] 42.25    Physical Exam:  Patient Lines/Drains/Airways Status    Active Active Lines, Drains, & Airways     Name:   Placement date:   Placement time:   Site:   Days:    ETT  7.5   07/06/20    1700     4    CVC Triple Lumen 06-Jul-2020 Non-tunneled Right Internal jugular   2020/07/06    1945    Internal jugular   4    NG/OG Tube Decompression;Feedings Center mouth   07/03/20    0610    Center mouth   4    Urethral Catheter   2020-07-06    ???    ???   5    Peripheral IV 07/06/2020 Left Antecubital   07-06-2020    1300    Antecubital   4    Arterial Line Jul 06, 2020 Right Radial   07/06/2020    2315    Radial   4              Physical exam deferred.   Vitals w/ PEEP 18 , FiO2 100. Tachycardic. Remains on NE.   Patient Lines/Drains/Airways Status    Active Active Lines, Drains, & Airways     Name:   Placement date:   Placement time:   Site:   Days:    ETT  7.5   07/06/2020    1700     4    CVC Triple Lumen Jul 06, 2020 Non-tunneled Right Internal jugular   07/06/20    1945    Internal jugular   4    NG/OG Tube Decompression;Feedings Center mouth   07/03/20    0610    Center mouth   4    Urethral Catheter   07-06-2020    ???    ???   5    Peripheral IV Jul 06, 2020 Left Antecubital   07/06/20    1300    Antecubital   5    Arterial Line 07/06/20 Right Radial   07-06-20    2315    Radial   4                Temp:  [36.6 ??C-37.4 ??C] 37.4 ??C  Core Temp:  [36.1 ??C-36.3 ??C] 36.2 ??C  Heart Rate:  [59-96] 59  SpO2 Pulse:  [60-90] 60  Resp:  [22-30] 30  BP: (114-119)/(41-47) 114/45  MAP (mmHg):  [67-71] 68  A BP-2: (102-156)/(43-68) 106/51  MAP:  [63 mmHg-93 mmHg] 64 mmHg  FiO2 (%):  [60 %-100 %] 60 %  SpO2:  [76 %-99 %] 94 %  BMI (Calculated):  [42.25] 42.25    Gen: intubated, sedated, not responsive to verbal commands or light physical touch  Cardia: RRR w/o appreciable murmur   Pulm:  Generally, clear to ascultation bilaterally on anterolateral fields  Abd: Soft, distended abd wall hernia which is easily reduced, hypoactive bowel sounds  GU/rectal: Deferred  Skin: No petechiae, ecchymoses or obvious rashes on clothed exam  MSK: No notable warm, tender or swollen joints  Neuro: No focal deficit appreciated, but full neurological exam deferred as pt intubated and sedated Data for Medical Decision Making  ( [id number] )     Recent Labs   Lab Units 07/07/20  0336 07/07/20  0233 07/07/20  0059 07/06/20  2007 07/06/20  1550 07/06/20  1550 07/06/20  0841 07/06/20  0450 07/05/20  2135 07/05/20  2101 07/05/20  0455 07/05/20  0311 07/04/20  2956 07/04/20  0313 07/03/20  0327 07/03/20  0327 2020-07-05  2053 07/05/20  2053   WBC 10*9/L 10.6  --   --   --   --   --   --  9.4  --   --   --  12.9*  --  10.5  --  5.8   < > 8.2   HEMOGLOBIN g/dL 21.3*  --   --   --   --   --   --  10.8*  --   --   --  13.0  --  12.6  --  12.9   < > 14.2   PLATELET COUNT (1) 10*9/L 390  --   --   --   --   --   --  330  --   --   --  410  --  403  --  214   < > 219   NEUTRO ABS 10*9/L  --   --   --   --   --   --   --   --   --   --   --   --   --   --   --   --   --  7.0   LYMPHO ABS 10*9/L  --   --   --   --   --   --   --   --   --   --   --   --   --   --   --   --   --  0.9*   EOSINO ABS 10*9/L  --   --   --   --   --   --   --   --   --   --   --   --   --   --   --   --   --  0.0   BUN mg/dL 96*  --  83*  --   --  82*  --  69* 64*  --    < > 45*   < > 44*   < > 24*   < > 20   CREATININE mg/dL 0.86*  --  5.78*  --   --  2.83*  --  2.50* 2.30*  --    < > 1.38*   < > 1.52*   < > 1.33*   < > 1.17*   AST U/L 59*  --  73*  --   --  51*  --  64* 51*  --    < > 23   < > 26   < > 41*   < > 53*   ALT U/L 55*  --  56*  --   --  38  --  39  32  --    < > 24   < > 31   < > 36   < > 44   BILIRUBIN TOTAL mg/dL 0.2*  --  0.2*  --   --  0.2*  --  0.2* 0.3  --    < > 0.3   < > 0.2*   < > 0.3   < > 0.4   ALK PHOS U/L 53  --  58  --   --  49  --  46 47  --    < > 48   < > 55   < > 59   < > 65   POTASSIUM WHOLE BLOOD mmol/L  --  5.5* 5.6* 5.0*   < > 4.9*   < > 4.8*  --    < >   < >  --    < >  --   --   --   --  4.0   POTASSIUM mmol/L 5.1*  --  5.6*  --   --  4.8*   < > 4.9* 4.8*   < >   < > 3.7   < > 4.3   < > 4.0   < > 4.0   MAGNESIUM mg/dL 2.6  --  2.6  --   --   --   --  2.2  --   --   --  2.1  --  2.4   < > 1.9   < > 2.0 PHOSPHORUS mg/dL 4.4  --  5.4*  --   --   --   --  3.4  --   --   --  2.9  --  3.9   < > 3.1   < > 2.7   CALCIUM mg/dL 8.2*  --  8.2*  --   --  8.2*  --  8.2* 8.3*  --    < > 8.4*   < > 8.5*   < > 8.0*   < > 8.3*   CK TOTAL U/L  --   --   --   --   --   --   --   --   --   --   --   --   --   --   --   --   --  198.0*    < > = values in this interval not displayed.       Drug Level   Results in Past 360 Days  Result Component Current Result   Vancomycin Rm 14.2 (07/05/2020)   Tacrolimus, Trough 5.8 (07/06/2020)       New Culture Data   Microbiology Results (last day)     Procedure Component Value Date/Time Date/Time    Blood Culture, Adult [[id number]] Collected: 07/04/20 0916    Lab Status: Preliminary result Specimen: Blood from 1 Peripheral Draw Updated: 07/07/20 0930     Blood Culture, Routine No Growth at 72 hours    Blood Culture, Adult [[id number]]  (Abnormal) Collected: 07/04/20 0916    Lab Status: Final result Specimen: Blood from 1 Peripheral Draw Updated: 07/06/20 2020     Blood Culture, Routine Staphylococcus epidermidis     Comment: Susceptibility Testing By Consultation Only  This organism is a coagulase-negative Staphylococcus species.         Staphylococcus hominis     Comment: Susceptibility Testing By Consultation Only  This organism is a coagulase-negative Staphylococcus species.  Gram Stain Result Gram positive cocci in clusters    Blood Culture #2 [[id number]] Collected: 07/06/20 1927    Lab Status: In process Specimen: Blood from 1 Peripheral Draw Updated: 07/06/20 1933    Blood Culture #1 [[id number]] Collected: 07/06/20 1926    Lab Status: In process Specimen: Blood from 1 Peripheral Draw Updated: 07/06/20 1932            Recent Studies     MRI Brain Wo Contrast    Result Date: 07/07/2020  EXAM: Magnetic resonance imaging, brain, without contrast material. DATE: 07/06/2020 10:29 PM ACCESSION: [id number] UN DICTATED: 07/07/2020 12:07 AM INTERPRETATION LOCATION: [facility] CLINICAL INDICATION: 54 years old Female with sz like activity, h/o sz, covid +, r/o ischemia  COMPARISON: None TECHNIQUE: Multiplanar, multisequence MR imaging of the brain was performed without I.V. contrast. FINDINGS:  There is a 0.6 x 0.4 x 0.4 cm T2 hyperintense/T1 hypointense lesion in right hippocampus (18:28, 100:74). This lesion does not suppress and is mildly hyperintense relative to surrounding hippocampus on FLAIR. There is no surrounding edema. Ventricles are normal in size. There is no midline shift. No extra-axial fluid collection. No evidence of intracranial hemorrhage. No diffusion weighted signal abnormality to suggest acute infarct.     0.6 cm lesion in the right hippocampus described above, which may represent a small DNET or focal hippocampal sclerosis; however can also represent a hippocampal sulcus remnant cyst, however this is less likely given nonsuppression on FLAIR. Attending note: The abnormality described in the impression represents an incidental and clinically insignificant hippocampal fissure cyst.    Echocardiogram W Colorflow Spectral Doppler    Result Date: 07/06/2020  Patient Info Name:     SORIYAH OSBERG Age:     24 years DOB:     [birth year] Gender:     Female MRN:     [id number] Accession #:     [id number] UN Ht:     155 cm Wt:     105 kg BSA:     2.19 m2 BP:     115 /     48 mmHg Technical Quality:     Fair Exam Date:     07/06/2020 1:47 PM Site Location:     [facility] Exam Location:     [facility] Admit Date:     06/12/2020 Exam Type:     ECHOCARDIOGRAM W COLORFLOW SPECTRAL DOPPLER Study Info Indications      - enlarged cardiac silhoutte on cxr,  evaluate LV Complete two-dimensional, color flow and Doppler transthoracic echocardiogram is performed. Staff Referring Physician:     Erick Blinks  ; Reading Fellow:     Jiles Garter MD Sonographer:     Susette Racer Ordering Physician:     Davene Costain B Account #:     [id number] Summary   1. The left ventricle is upper normal in size with mildly increased wall thickness.   2. The left ventricular systolic function is normal, LVEF is visually estimated at > 55%.   3. The left atrium is mildly dilated in size.   4. The right ventricle is normal in size, with normal systolic function. Left Ventricle   The left ventricle is upper normal in size with mildly increased wall thickness.   The left ventricular systolic function is normal, LVEF is visually estimated at > 55%.   The left ventricular diastolic function is indeterminate. Right Ventricle   The right ventricle is normal in size, with normal systolic function. Left Atrium   The left atrium  is mildly dilated in size. Right Atrium   The right atrium is normal  in size. Aortic Valve   The aortic valve is trileaflet with normal appearing leaflets with normal excursion.   There is no significant aortic regurgitation.   There is no evidence of a significant transvalvular gradient. Pulmonic Valve   Pulmonary valve is not well visualized.   There is no significant pulmonic regurgitation.   There is no evidence of a significant transvalvular gradient. Mitral Valve   The mitral valve leaflets are normal with normal leaflet mobility.   There is no significant mitral valve regurgitation. Tricuspid Valve   The tricuspid valve leaflets are normal, with normal leaflet mobility.   There is trivial tricuspid regurgitation.   Pulmonary systolic pressure cannot be estimated due to insufficient TR jet. Pericardium/Pleural   There is no pericardial effusion. Inferior Vena Cava   Mechanical ventilation precludes the ability to accurately assess right atrial pressure. Aorta   The aorta is normal in size in the visualized segments. Pulmonic Valve ---------------------------------------------------------------------- Name                                 Value        Normal ---------------------------------------------------------------------- PV Doppler ---------------------------------------------------------------------- PV Peak Velocity                   1.1 m/s Mitral Valve ---------------------------------------------------------------------- Name                                 Value        Normal ---------------------------------------------------------------------- MV Diastolic Function ---------------------------------------------------------------------- MV E Peak Velocity                 75 cm/s               MV A Peak Velocity                 84 cm/s               MV E/A                                 0.9               MV Annular TDI ---------------------------------------------------------------------- MV Septal e' Velocity             6.1 cm/s         >=8.0 MV Lateral e' Velocity            7.6 cm/s        >=10.0 MV e' Average                          6.9               MV E/e' (Average)                     11.1 Tricuspid Valve ---------------------------------------------------------------------- Name                                 Value        Normal ---------------------------------------------------------------------- TV Regurgitation Doppler ---------------------------------------------------------------------- TR Peak Velocity  3 m/s Aorta ---------------------------------------------------------------------- Name                                 Value        Normal ---------------------------------------------------------------------- Ascending Aorta ---------------------------------------------------------------------- Ao Root Diameter (2D)               3.4 cm               Ao Root Diam Index (2D)         15.6 cm/m2 Venous ---------------------------------------------------------------------- Name                                 Value        Normal ---------------------------------------------------------------------- IVC/SVC ---------------------------------------------------------------------- IVC Diameter (Exp 2D) 2.2 cm         <=2.1 Aortic Valve ---------------------------------------------------------------------- Name                                 Value        Normal ---------------------------------------------------------------------- AV Doppler ---------------------------------------------------------------------- AV Peak Velocity                   1.6 m/s Ventricles ---------------------------------------------------------------------- Name                                 Value        Normal ---------------------------------------------------------------------- LV Dimensions 2D/MM ---------------------------------------------------------------------- IVS Diastolic Thickness (2D)        1.1 cm       0.6-0.9 LVID Diastole (2D)                  5.2 cm       3.8-5.2 LVPW Diastolic Thickness (2D)                                1.1 cm       0.6-0.9 LVID Systole (2D)                   2.8 cm       2.2-3.5 RV Dimensions 2D/MM ---------------------------------------------------------------------- RV Basal Diastolic Dimension        3.7 cm       2.5-4.1 TAPSE                               1.9 cm         >=1.7 Atria ---------------------------------------------------------------------- Name                                 Value        Normal ---------------------------------------------------------------------- LA Dimensions ---------------------------------------------------------------------- LA Dimension (2D)                   4.5 cm       2.7-3.8 LA Volume (BP MOD)                   64 ml               LA Volume Index (BP MOD)       29.18 ml/m2  16.00-34.00 RA Dimensions ---------------------------------------------------------------------- RA Area (4C)                      10.9 cm2        <=18.0 RA Area (4C) Index              5.0 cm2/m2 Report Signatures Resident Jiles Garter  MD on 07/06/2020 03:36 PM

## 2020-07-07 NOTE — Unmapped (Signed)
MICU Evening Summary     Date of Service: 07/06/2020    Interval History: Evelyn Rollins is a 54 y.o. 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 ??Pt was intubated for hypoxic respiratory failure. Critical care services are indicated for AHRF r/t covid pna, AKI, ileus, VAP.       Assessment & Plan     Neuro: cont scheduled oxy/ativan and versed/dilaudid gtts for vent sync and compliance. vec given prior to MRI. Which was tolerated well. Prelim NAICP.   Pulm: tolerating VC, supine, cont peep at 20, plan to wean fio2 back down following MRI    CV:low dose levo for MAP >65, formal echo today EF wnl wo RV dysfxn  Renal: elevated Cr, amidst fluid bolus, oliguric,lactate downtrend to wnl  GI: cont bowel regimen, TF at goal, tol well, completed reglan   Heme: lovenox group B, no evid bleeding  Endo: nph added 14q12 + ADI  ID: cont vanc/cefep, gpc blood likely contaminate (1/2 staph epi and the other staph hominus) and sputum MSSA, f/u ID recs to de-escalate, tac level elevated, f/u w pharm in am to dec dose, plan to hold am dose until level back      Critical Care Attestation     This patient is critically ill or injured with the impairment of vital organ systems such that there is a high probability of imminent or life threatening deterioration in the patient's condition. This patient must remain in the ICU for ongoing evaluation of the comprehensive management plan outlined in this note. I directly provided critical care services as documented in this note and the critical care time spent (45 min) is exclusive of separately billable procedures.    Ladasha Schnackenberg Fonnie Mu, ACNP

## 2020-07-07 NOTE — Unmapped (Addendum)
ADULT SPECIALTY CARE TEAM  Transport Summary Note     Departing Unit: MICU Departure Time: 2100     Unit Returned To: MICU     Return Time: 2245             Report received from primary nurse via SBARq. Patient prepared to transport to MRI via stretcher   under ICU Transport Protocol. Vital signs during transport, see vital signs section of DocFlowsheet for further details. Patient is sedated  . O2 via ventilator @ 100%  . Patient tolerated procedure well. VSS.  Systolic bps 110-120, diastolic 41-44, Maps 65-68, O2 sats. 92-94%. Pandemic   precautions maintained throughout transport.     Returned to MICU, update and care given to primary nurse. See Doc Flowsheet for additional transport documentation.

## 2020-07-08 LAB — BLOOD GAS CRITICAL CARE PANEL, ARTERIAL
BASE EXCESS ARTERIAL: -5.8 — ABNORMAL LOW (ref -2.0–2.0)
BASE EXCESS ARTERIAL: -6.4 — ABNORMAL LOW (ref -2.0–2.0)
BASE EXCESS ARTERIAL: -6.9 — ABNORMAL LOW (ref -2.0–2.0)
BASE EXCESS ARTERIAL: -7.2 — ABNORMAL LOW (ref -2.0–2.0)
BASE EXCESS ARTERIAL: -8.1 — ABNORMAL LOW (ref -2.0–2.0)
CALCIUM IONIZED ARTERIAL (MG/DL): 4.7 mg/dL (ref 4.40–5.40)
CALCIUM IONIZED ARTERIAL (MG/DL): 4.71 mg/dL (ref 4.40–5.40)
CALCIUM IONIZED ARTERIAL (MG/DL): 4.77 mg/dL (ref 4.40–5.40)
CALCIUM IONIZED ARTERIAL (MG/DL): 4.78 mg/dL (ref 4.40–5.40)
CALCIUM IONIZED ARTERIAL (MG/DL): 4.81 mg/dL (ref 4.40–5.40)
CALCIUM IONIZED ARTERIAL (MG/DL): 4.88 mg/dL (ref 4.40–5.40)
CALCIUM IONIZED ARTERIAL (MG/DL): 5.03 mg/dL (ref 4.40–5.40)
GLUCOSE WHOLE BLOOD: 101 mg/dL (ref 70–179)
GLUCOSE WHOLE BLOOD: 122 mg/dL (ref 70–179)
GLUCOSE WHOLE BLOOD: 126 mg/dL (ref 70–179)
GLUCOSE WHOLE BLOOD: 171 mg/dL (ref 70–179)
GLUCOSE WHOLE BLOOD: 227 mg/dL — ABNORMAL HIGH (ref 70–179)
HCO3 ARTERIAL: 18 mmol/L — ABNORMAL LOW (ref 22–27)
HCO3 ARTERIAL: 19 mmol/L — ABNORMAL LOW (ref 22–27)
HCO3 ARTERIAL: 19 mmol/L — ABNORMAL LOW (ref 22–27)
HCO3 ARTERIAL: 19 mmol/L — ABNORMAL LOW (ref 22–27)
HCO3 ARTERIAL: 19 mmol/L — ABNORMAL LOW (ref 22–27)
HCO3 ARTERIAL: 20 mmol/L — ABNORMAL LOW (ref 22–27)
HCO3 ARTERIAL: 20 mmol/L — ABNORMAL LOW (ref 22–27)
HEMOGLOBIN BLOOD GAS: 10.7 g/dL — ABNORMAL LOW (ref 12.00–16.00)
HEMOGLOBIN BLOOD GAS: 10.9 g/dL — ABNORMAL LOW (ref 12.00–16.00)
HEMOGLOBIN BLOOD GAS: 11 g/dL — ABNORMAL LOW (ref 12.00–16.00)
HEMOGLOBIN BLOOD GAS: 11 g/dL — ABNORMAL LOW (ref 12.00–16.00)
HEMOGLOBIN BLOOD GAS: 11.1 g/dL — ABNORMAL LOW (ref 12.00–16.00)
HEMOGLOBIN BLOOD GAS: 11.6 g/dL — ABNORMAL LOW (ref 12.00–16.00)
HEMOGLOBIN BLOOD GAS: 14.8 g/dL (ref 12.00–16.00)
LACTATE BLOOD ARTERIAL: 1.7 mmol/L — ABNORMAL HIGH (ref <1.3)
LACTATE BLOOD ARTERIAL: 1.8 mmol/L — ABNORMAL HIGH (ref <1.3)
LACTATE BLOOD ARTERIAL: 1.8 mmol/L — ABNORMAL HIGH (ref <1.3)
LACTATE BLOOD ARTERIAL: 1.8 mmol/L — ABNORMAL HIGH (ref <1.3)
LACTATE BLOOD ARTERIAL: 1.8 mmol/L — ABNORMAL HIGH (ref <1.3)
LACTATE BLOOD ARTERIAL: 1.6 mmol/L — ABNORMAL HIGH (ref <1.3)
O2 SATURATION ARTERIAL: 96.3 % (ref 94.0–100.0)
O2 SATURATION ARTERIAL: 93.3 % — ABNORMAL LOW (ref 94.0–100.0)
O2 SATURATION ARTERIAL: 96.1 % (ref 94.0–100.0)
O2 SATURATION ARTERIAL: 99.3 % (ref 94.0–100.0)
O2 SATURATION ARTERIAL: 98.2 % (ref 94.0–100.0)
PCO2 ARTERIAL: 41 mmHg (ref 35.0–45.0)
PCO2 ARTERIAL: 40.3 mmHg (ref 35.0–45.0)
PCO2 ARTERIAL: 43.4 mmHg (ref 35.0–45.0)
PCO2 ARTERIAL: 41.6 mmHg (ref 35.0–45.0)
PCO2 ARTERIAL: 40 mmHg (ref 35.0–45.0)
PCO2 ARTERIAL: 39.6 mmHg (ref 35.0–45.0)
PH ARTERIAL: 7.27 — ABNORMAL LOW (ref 7.35–7.45)
PH ARTERIAL: 7.27 — ABNORMAL LOW (ref 7.35–7.45)
PH ARTERIAL: 7.29 — ABNORMAL LOW (ref 7.35–7.45)
PH ARTERIAL: 7.29 — ABNORMAL LOW (ref 7.35–7.45)
PH ARTERIAL: 7.29 — ABNORMAL LOW (ref 7.35–7.45)
PH ARTERIAL: 7.29 — ABNORMAL LOW (ref 7.35–7.45)
PO2 ARTERIAL: 108 mmHg (ref 80.0–110.0)
PO2 ARTERIAL: 159 mmHg — ABNORMAL HIGH (ref 80.0–110.0)
PO2 ARTERIAL: 164 mmHg — ABNORMAL HIGH (ref 80.0–110.0)
PO2 ARTERIAL: 74.6 mmHg — ABNORMAL LOW (ref 80.0–110.0)
PO2 ARTERIAL: 84.6 mmHg (ref 80.0–110.0)
POTASSIUM WHOLE BLOOD: 4.6 mmol/L (ref 3.4–4.6)
POTASSIUM WHOLE BLOOD: 4.4 mmol/L (ref 3.4–4.6)
POTASSIUM WHOLE BLOOD: 4.7 mmol/L — ABNORMAL HIGH (ref 3.4–4.6)
POTASSIUM WHOLE BLOOD: 4.7 mmol/L — ABNORMAL HIGH (ref 3.4–4.6)
POTASSIUM WHOLE BLOOD: 4.8 mmol/L — ABNORMAL HIGH (ref 3.4–4.6)
POTASSIUM WHOLE BLOOD: 4.4 mmol/L (ref 3.4–4.6)
SODIUM WHOLE BLOOD: 143 mmol/L (ref 135–145)
SODIUM WHOLE BLOOD: 141 mmol/L (ref 135–145)
SODIUM WHOLE BLOOD: 143 mmol/L (ref 135–145)
SODIUM WHOLE BLOOD: 145 mmol/L (ref 135–145)
SODIUM WHOLE BLOOD: 142 mmol/L (ref 135–145)

## 2020-07-08 LAB — CBC W/ AUTO DIFF
BASOPHILS ABSOLUTE COUNT: 0 10*9/L (ref 0.0–0.1)
BASOPHILS RELATIVE PERCENT: 0.2 %
EOSINOPHILS RELATIVE PERCENT: 0 %
HEMATOCRIT: 35.6 % — ABNORMAL LOW (ref 36.0–46.0)
HEMOGLOBIN: 11 g/dL — ABNORMAL LOW (ref 12.0–16.0)
LARGE UNSTAINED CELLS: 1 % (ref 0–4)
LYMPHOCYTES ABSOLUTE COUNT: 0.5 10*9/L — ABNORMAL LOW (ref 1.5–5.0)
LYMPHOCYTES RELATIVE PERCENT: 3.4 %
MEAN CORPUSCULAR HEMOGLOBIN CONC: 31 g/dL (ref 31.0–37.0)
MEAN CORPUSCULAR HEMOGLOBIN: 28.5 pg (ref 26.0–34.0)
MEAN CORPUSCULAR VOLUME: 91.9 fL (ref 80.0–100.0)
MEAN PLATELET VOLUME: 8.8 fL (ref 7.0–10.0)
MONOCYTES ABSOLUTE COUNT: 0.3 10*9/L (ref 0.2–0.8)
MONOCYTES RELATIVE PERCENT: 2 %
NEUTROPHILS ABSOLUTE COUNT: 14.5 10*9/L — ABNORMAL HIGH (ref 2.0–7.5)
NEUTROPHILS RELATIVE PERCENT: 93.5 %
PLATELET COUNT: 473 10*9/L — ABNORMAL HIGH (ref 150–440)
RED BLOOD CELL COUNT: 3.87 10*12/L — ABNORMAL LOW (ref 4.00–5.20)
WBC ADJUSTED: 15.5 10*9/L — ABNORMAL HIGH (ref 4.5–11.0)

## 2020-07-08 LAB — ENDOTOOL
ENDOTOOL GLUCOSE: 186 mg/dL — ABNORMAL HIGH (ref 110–140)
ENDOTOOL GLUCOSE: 158 mg/dL — ABNORMAL HIGH (ref 110–140)
ENDOTOOL GLUCOSE: 155 mg/dL — ABNORMAL HIGH (ref 110–140)
ENDOTOOL GLUCOSE: 130 mg/dL (ref 110–140)
ENDOTOOL GLUCOSE: 94 mg/dL — ABNORMAL LOW (ref 110–140)
ENDOTOOL GLUCOSE: 140 mg/dL (ref 110–140)
ENDOTOOL GLUCOSE: 130 mg/dL (ref 110–140)
ENDOTOOL GLUCOSE: 116 mg/dL (ref 110–140)
ENDOTOOL INSULIN RATE: 15 U/h
ENDOTOOL INSULIN RATE: 16 U/h
ENDOTOOL INSULIN RATE: 11 U/h
ENDOTOOL INSULIN RATE: 0 U/h
ENDOTOOL INSULIN RATE: 10 U/h
ENDOTOOL INSULIN RATE: 4.4 U/h

## 2020-07-08 LAB — COMPREHENSIVE METABOLIC PANEL
ALBUMIN: 1.7 g/dL — ABNORMAL LOW (ref 3.4–5.0)
ALBUMIN: 1.7 g/dL — ABNORMAL LOW (ref 3.4–5.0)
ALBUMIN: 1.8 g/dL — ABNORMAL LOW (ref 3.4–5.0)
ALKALINE PHOSPHATASE: 62 U/L (ref 46–116)
ALKALINE PHOSPHATASE: 51 U/L (ref 46–116)
ALKALINE PHOSPHATASE: 59 U/L (ref 46–116)
ALT (SGPT): 42 U/L (ref 10–49)
ANION GAP: 10 mmol/L (ref 5–14)
ANION GAP: 9 mmol/L (ref 5–14)
ANION GAP: 12 mmol/L (ref 5–14)
AST (SGOT): 45 U/L — ABNORMAL HIGH (ref <=34)
AST (SGOT): 49 U/L — ABNORMAL HIGH (ref <=34)
AST (SGOT): 56 U/L — ABNORMAL HIGH (ref <=34)
BILIRUBIN TOTAL: <0.2 mg/dL — ABNORMAL LOW (ref 0.3–1.2)
BILIRUBIN TOTAL: 0.2 mg/dL — ABNORMAL LOW (ref 0.3–1.2)
BILIRUBIN TOTAL: 0.2 mg/dL — ABNORMAL LOW (ref 0.3–1.2)
BLOOD UREA NITROGEN: 117 mg/dL — ABNORMAL HIGH (ref 9–23)
BLOOD UREA NITROGEN: 130 mg/dL — ABNORMAL HIGH (ref 9–23)
BUN / CREAT RATIO: 28
BUN / CREAT RATIO: 30
BUN / CREAT RATIO: 29
CALCIUM: 8.2 mg/dL — ABNORMAL LOW (ref 8.7–10.4)
CALCIUM: 8.4 mg/dL — ABNORMAL LOW (ref 8.7–10.4)
CHLORIDE: 112 mmol/L — ABNORMAL HIGH (ref 98–107)
CHLORIDE: 112 mmol/L — ABNORMAL HIGH (ref 98–107)
CHLORIDE: 113 mmol/L — ABNORMAL HIGH (ref 98–107)
CO2: 21 mmol/L (ref 20.0–31.0)
CO2: 19 mmol/L — ABNORMAL LOW (ref 20.0–31.0)
CREATININE: 4.11 mg/dL — ABNORMAL HIGH
CREATININE: 4.28 mg/dL — ABNORMAL HIGH
CREATININE: 4.52 mg/dL — ABNORMAL HIGH
EGFR CKD-EPI AA FEMALE: 13 mL/min/{1.73_m2} — ABNORMAL LOW (ref >=60)
EGFR CKD-EPI AA FEMALE: 12 mL/min/{1.73_m2} — ABNORMAL LOW (ref >=60)
EGFR CKD-EPI NON-AA FEMALE: 12 mL/min/{1.73_m2} — ABNORMAL LOW (ref >=60)
EGFR CKD-EPI NON-AA FEMALE: 10 mL/min/{1.73_m2} — ABNORMAL LOW (ref >=60)
GLUCOSE RANDOM: 194 mg/dL — ABNORMAL HIGH (ref 70–179)
GLUCOSE RANDOM: 175 mg/dL (ref 70–179)
GLUCOSE RANDOM: 127 mg/dL (ref 70–179)
POTASSIUM: 4.7 mmol/L — ABNORMAL HIGH (ref 3.4–4.5)
POTASSIUM: 4.5 mmol/L (ref 3.4–4.5)
POTASSIUM: 4.6 mmol/L — ABNORMAL HIGH (ref 3.4–4.5)
PROTEIN TOTAL: 5.6 g/dL — ABNORMAL LOW (ref 5.7–8.2)
PROTEIN TOTAL: 5.4 g/dL — ABNORMAL LOW (ref 5.7–8.2)
PROTEIN TOTAL: 5.8 g/dL (ref 5.7–8.2)
SODIUM: 143 mmol/L (ref 135–145)
SODIUM: 142 mmol/L (ref 135–145)
SODIUM: 144 mmol/L (ref 135–145)

## 2020-07-08 LAB — ENDOTOOL INSULIN RATE
Lab: 0
Lab: 17
Lab: 4.4
Lab: 4.6

## 2020-07-08 LAB — TACROLIMUS, TROUGH: Lab: 9.2

## 2020-07-08 LAB — ENDOTOOL GLUCOSE
Lab: 116
Lab: 127
Lab: 130
Lab: 130
Lab: 140
Lab: 143 — ABNORMAL HIGH
Lab: 158 — ABNORMAL HIGH
Lab: 163 — ABNORMAL HIGH
Lab: 165 — ABNORMAL HIGH
Lab: 166 — ABNORMAL HIGH
Lab: 202 — ABNORMAL HIGH
Lab: 223 — ABNORMAL HIGH

## 2020-07-08 LAB — BASE EXCESS ARTERIAL: Base excess:SCnc:Pt:BldA:Qn:Calculated: -8.1 — ABNORMAL LOW

## 2020-07-08 LAB — LACTATE BLOOD ARTERIAL
Lactate:SCnc:Pt:BldA:Qn:: 1.6 — ABNORMAL HIGH
Lactate:SCnc:Pt:BldA:Qn:: 1.8 — ABNORMAL HIGH

## 2020-07-08 LAB — SODIUM WHOLE BLOOD: Sodium:SCnc:Pt:Bld:Qn:: 143

## 2020-07-08 LAB — ENDOTOOL NEXT GLUCOSE

## 2020-07-08 LAB — MAGNESIUM: Magnesium:MCnc:Pt:Ser/Plas:Qn:: 2.5

## 2020-07-08 LAB — ALT (SGPT): Alanine aminotransferase:CCnc:Pt:Ser/Plas:Qn:: 36

## 2020-07-08 LAB — ALBUMIN: Albumin:MCnc:Pt:Ser/Plas:Qn:: 1.8 — ABNORMAL LOW

## 2020-07-08 LAB — BASOPHILIC STIPPLING

## 2020-07-08 LAB — HEMOGLOBIN BLOOD GAS: Hemoglobin:MCnc:Pt:Bld:Qn:: 11 — ABNORMAL LOW

## 2020-07-08 LAB — SLIDE REVIEW

## 2020-07-08 LAB — PROTEIN TOTAL: Protein:MCnc:Pt:Ser/Plas:Qn:: 5.6 — ABNORMAL LOW

## 2020-07-08 LAB — D-DIMER QUANTITATIVE (CW,ML,HL): Lab: 1562 — ABNORMAL HIGH

## 2020-07-08 LAB — PCO2 ARTERIAL: Carbon dioxide:PPres:Pt:BldA:Qn:: 41

## 2020-07-08 LAB — PHOSPHORUS: Phosphate:MCnc:Pt:Ser/Plas:Qn:: 3.5

## 2020-07-08 LAB — EOSINOPHILS RELATIVE PERCENT: Eosinophils/100 leukocytes:NFr:Pt:Bld:Qn:Automated count: 0

## 2020-07-08 LAB — GLUCOSE WHOLE BLOOD: Glucose:MCnc:Pt:Bld:Qn:: 171

## 2020-07-08 MED ADMIN — chlorhexidine (PERIDEX) 0.12 % solution 5 mL: 5 mL | OROMUCOSAL | @ 13:00:00

## 2020-07-08 MED ADMIN — oxyCODONE (ROXICODONE) immediate release tablet 30 mg: 30 mg | GASTROENTERAL | @ 05:00:00 | Stop: 2020-07-17

## 2020-07-08 MED ADMIN — polyethylene glycol (MIRALAX) packet 17 g: 17 g | GASTROENTERAL | @ 18:00:00

## 2020-07-08 MED ADMIN — midazolam (VERSED) injection 5 mg: 5 mg | INTRAVENOUS | @ 19:00:00

## 2020-07-08 MED ADMIN — heparin (porcine) 7,500 units/0.75 mL syringe: 7500 [IU] | SUBCUTANEOUS | @ 01:00:00

## 2020-07-08 MED ADMIN — oxyCODONE (ROXICODONE) immediate release tablet 30 mg: 30 mg | GASTROENTERAL | @ 16:00:00 | Stop: 2020-07-17

## 2020-07-08 MED ADMIN — HYDROmorphone 1 mg/mL in 0.9% sodium chloride: 0-10 mg/h | INTRAVENOUS | @ 22:00:00 | Stop: 2020-07-16

## 2020-07-08 MED ADMIN — HYDROmorphone 1 mg/mL in 0.9% sodium chloride: 0-10 mg/h | INTRAVENOUS | @ 16:00:00 | Stop: 2020-07-16

## 2020-07-08 MED ADMIN — oxyCODONE (ROXICODONE) immediate release tablet 30 mg: 30 mg | GASTROENTERAL | @ 01:00:00 | Stop: 2020-07-17

## 2020-07-08 MED ADMIN — chlorhexidine (PERIDEX) 0.12 % solution 5 mL: 5 mL | OROMUCOSAL | @ 01:00:00

## 2020-07-08 MED ADMIN — dexamethasone (DECADRON) 4 mg/mL injection 6 mg: 6 mg | INTRAVENOUS | Stop: 2020-07-12

## 2020-07-08 MED ADMIN — levETIRAcetam (KEPPRA) tablet 500 mg: 500 mg | GASTROENTERAL | @ 12:00:00

## 2020-07-08 MED ADMIN — midazolam in sodium chloride 0.9% (1 mg/mL) infusion PMB: 0-10 mg/h | INTRAVENOUS | @ 16:00:00

## 2020-07-08 MED ADMIN — levothyroxine (SYNTHROID) tablet 100 mcg: 100 ug | GASTROENTERAL | @ 10:00:00

## 2020-07-08 MED ADMIN — insulin regular (HumuLIN,NovoLIN) 100 Units in sodium chloride (NS) 0.9 % 100 mL infusion: 0-50 [IU]/h | INTRAVENOUS | @ 09:00:00

## 2020-07-08 MED ADMIN — HYDROmorphone 1 mg/mL in 0.9% sodium chloride: 0-10 mg/h | INTRAVENOUS | Stop: 2020-07-16

## 2020-07-08 MED ADMIN — famotidine (PEPCID) tablet 20 mg: 20 mg | GASTROENTERAL | @ 12:00:00

## 2020-07-08 MED ADMIN — oxyCODONE (ROXICODONE) immediate release tablet 30 mg: 30 mg | GASTROENTERAL | @ 08:00:00 | Stop: 2020-07-17

## 2020-07-08 MED ADMIN — tacrolimus (PROGRAF) oral suspension: .5 mg | GASTROENTERAL | @ 16:00:00 | Stop: 2020-07-08

## 2020-07-08 MED ADMIN — HYDROmorphone 1 mg/mL in 0.9% sodium chloride: 0-10 mg/h | INTRAVENOUS | @ 05:00:00 | Stop: 2020-07-16

## 2020-07-08 MED ADMIN — senna (SENOKOT) tablet 2 tablet: 2 | GASTROENTERAL | @ 12:00:00

## 2020-07-08 MED ADMIN — LORazepam (ATIVAN) tablet 4 mg: 4 mg | GASTROENTERAL | @ 16:00:00

## 2020-07-08 MED ADMIN — oxyCODONE (ROXICODONE) immediate release tablet 30 mg: 30 mg | GASTROENTERAL | @ 12:00:00 | Stop: 2020-07-17

## 2020-07-08 MED ADMIN — midazolam (VERSED) injection 5 mg: 5 mg | INTRAVENOUS | @ 11:00:00

## 2020-07-08 MED ADMIN — furosemide (LASIX) 120 mg in sodium chloride (NS) 0.9 % 50 mL IVPB: 120 mg | INTRAVENOUS | @ 12:00:00 | Stop: 2020-07-08

## 2020-07-08 MED ADMIN — LORazepam (ATIVAN) tablet 4 mg: 4 mg | GASTROENTERAL | @ 01:00:00

## 2020-07-08 MED ADMIN — LORazepam (ATIVAN) tablet 4 mg: 4 mg | GASTROENTERAL | @ 08:00:00

## 2020-07-08 MED ADMIN — pravastatin (PRAVACHOL) tablet 40 mg: 40 mg | GASTROENTERAL | @ 12:00:00

## 2020-07-08 MED ADMIN — HYDROmorphone 1 mg/mL in 0.9% sodium chloride: 0-10 mg/h | INTRAVENOUS | @ 11:00:00 | Stop: 2020-07-16

## 2020-07-08 MED ADMIN — polyethylene glycol (MIRALAX) packet 17 g: 17 g | GASTROENTERAL | @ 01:00:00

## 2020-07-08 MED ADMIN — furosemide (LASIX) 120 mg in sodium chloride (NS) 0.9 % 50 mL IVPB: 120 mg | INTRAVENOUS | Stop: 2020-07-08

## 2020-07-08 MED ADMIN — LORazepam (ATIVAN) tablet 4 mg: 4 mg | GASTROENTERAL | @ 05:00:00

## 2020-07-08 MED ADMIN — LORazepam (ATIVAN) tablet 4 mg: 4 mg | GASTROENTERAL | @ 20:00:00

## 2020-07-08 MED ADMIN — midazolam in sodium chloride 0.9% (1 mg/mL) infusion PMB: 0-6 mg/h | INTRAVENOUS | @ 01:00:00

## 2020-07-08 MED ADMIN — HYDROmorphone (PF) (DILAUDID) injection 4 mg: 4 mg | INTRAVENOUS | @ 19:00:00

## 2020-07-08 NOTE — Unmapped (Signed)
Tacrolimus Therapeutic Monitoring Pharmacy Note    Evelyn Rollins is a 54 y.o. female continuing tacrolimus.     Indication: Liver transplant     Date of Transplant: 2016      Prior Dosing Information: Home regimen  tacrolimus 2mg  am and 1mg  pm   Reduced to 1mg  + 0.5mg  on 7/27    Goals:  Therapeutic Drug Levels  Tacrolimus trough goal: 3-5 ng/mL per Consult note 07/03/20    Additional Clinical Monitoring/Outcomes  ?? Monitor renal function (SCr and urine output) and liver function (LFTs)  ?? Monitor for signs/symptoms of adverse events (e.g., hyperglycemia, hyperkalemia, hypomagnesemia, hypertension, headache, tremor)    Results:   Tacrolimus level: 9.2 ng/mL, drawn appropriately    Pharmacokinetic Considerations and Significant Drug Interactions:  ??? Concurrent hepatotoxic medications: none identified at time of note  ??? Concurrent CYP3A4 substrates/inhibitors: none identified at time of note  ??? Concurrent nephrotoxic medications: none identified at time of note    Assessment/Plan:  UNLESS INDICATED OTHERWISE BY TRANSPLANT TEAM    Recommendation(s)  ??? HOLD tac until levels downtrend   ??? Will continue to watch levels daily\    Follow-up  ??? will check a random level on Thursday 7/29 with AM labs.   ??? A pharmacist will continue to monitor and recommend levels as appropriate    Longitudinal Dose Monitoring:  Date Dose (mg), Route AM Scr (mg/dL) Level  (ng/mL) Key Drug Interactions   07/28 1 + HOLD 4.11 9.2 (0744) HOLDING   07/27 2 + 0.5 NG 3.27 9.5 (0840) Reduce to 1mg  / 0.5mg    07/26 2+1 NG 2.50 5.8 (0650)    07/25 2+1 NG 1.38 3.5 (1610)    07/24 2+1 NG 1.52 2.3 (9604)    07/23 1mg  NG 1.3 1.8 (5409)         Please page service pharmacist with questions/clarifications.    Charleen Kirks, PharmD

## 2020-07-08 NOTE — Unmapped (Signed)
WOCN Consult Services                                                 Wound Evaluation: HRSA    Reason for Consult:   - High Risk Skin Assessment  - Initial    Problem List:   Active Problems:    Liver transplant recipient (CMS-HCC)    Morbid obesity with BMI of 40.0-44.9, adult (CMS-HCC)    Acute hypoxemic respiratory failure due to severe acute respiratory syndrome coronavirus 2 (SARS-CoV-2) disease (CMS-HCC)    Assessment: Consulted for HRSA for patient in Covid ICU. Patient admitted on 07/06/2020 with a history of NAFLD cirrhosis s/p liver transplant in 2016 on chronic immune suppression with cell cept and tacrolimus, htn, DM, and hypothyroidism admitted 7.22 with myalgias, sore throat, loss of taste and fever x 5 days. ??Pt was intubated for hypoxic respiratory failure. Patient has been on pronation therapy. She is currently supinated.   High Risk Skin Assessment (HRSA):     ??? Head to toe skin assessment completed   ??? Dusky skin noted on bilateral cheeks under mepilex XT - blanching     ??? At risk bony prominences padded with silicone foam dressings     ??? Skin under each device is protected or the device can be repositioned      ??? Head/face is appropriately positioned on a fluidized positioner or is directly on the bed surface    ??? Wedges and/or pillows used for repositioning patient     ??? Heels/feet are elevated off the bed surface     ??? Single layer of incontinence pad used under patient    ??? Skin care interventions implemented for prevention of moisture associated skin damage (MASD)    ??? Nutrition Interventions implemented      Continence Status:   Incontinence of bladder: Foley in place  Incontinent of bowel: Fecal management system in place    Moisture Associated Skin Damage:   - none noted     Lab Results   Component Value Date    WBC 15.5 (H) 07/08/2020    HGB 11.0 (L) 07/08/2020    HCT 35.6 (L) 07/08/2020    CRP 131.0 (H) 07/01/2020    A1C 5.6 07/03/2020    GLUF 107 (H) 05/28/2020    GLU 194 (H) 07/08/2020    POCGLU 166 07/08/2020    ALBUMIN 1.7 (L) 07/08/2020    PREALBUMIN 11.2 (L) 07/15/2015    PROT 5.6 (L) 07/08/2020     Risk Factors:   - Diabetes  - Immobility  - Immunosuppression  - Lack of sensory perception  - Moisture  - Multiple co-morbidities  - Obesity  - ICU hospitalization/Covid 19/pronation therapy    Braden Scale Score: 12         Support Surface:   - Low Air Loss - ICU    Type Debridement Completed By WOCN:  N/A    Teaching:  - n/a    WOCN Recommendations:   - Contact WOCN with questions, concerns, or wound deterioration.  - continue pressure injury prevention interventions.    Topical Therapy/Interventions:   - Silicone bordered foam    Recommended Consults:  - Not Applicable    WOCN Follow Up:  - Weekly    Plan of Care Discussed With:   - RN [employer]  Ordered: No    Workup Time:   30 minutes   Reconsult for questions/concerns/deterioration.   PAGE VIA WEBXCHANGE     Renaye Rakers RN, BSN, [employer]

## 2020-07-08 NOTE — Unmapped (Signed)
MICU Daily Progress Note     Date of Service: 07/08/2020    Problem List:   Active Problems:    Liver transplant recipient (CMS-HCC)    Morbid obesity with BMI of 40.0-44.9, adult (CMS-HCC)    Acute hypoxemic respiratory failure due to severe acute respiratory syndrome coronavirus 2 (SARS-CoV-2) disease (CMS-HCC)  Resolved Problems:    * No resolved hospital problems. *      Interval history:  Evelyn Rollins is a 54 y.o. female with PMH cirrhosis status post liver transplant 2016 on chronic immunosuppressive therapy, hypertension, diabetes, hypothyroidism who presented to [facility] on 7/22 with myalgias, sore throat, loss of taste and fevers  x5 days. When her symptoms had started, she took a home Covid test on 7/17 which was found to be positive. She has had poor p.o. intake for the last several days. Her husband also has similar symptoms. In the OSH ED she was found to be severely hypoxic with O2 saturations of 50% on room air. She was placed on a nonrebreather and current saturations increased to the low 90s. Chest x-ray shows bilateral infiltrates consistent with COVID-19 pneumonia. Her Covid test is positive. Inflammatory markers are elevated. She received a dose of Decadron in the emergency room.    24hr events:   - remains on endotool   - 1200 uop after lasix 120 mg x 2    Neurological   Analgesia and Sedation  -dilaudid and versed gtt >> plan to wean dilaudid first as tol  - goal RASS 3- to 4-  -PRN dilaudid and versed  -ativan 2q4h, oxy 20q4h >> inc to 4q4 and 30 q4  ??  Anxiety, depression  -holding home meds abilify, wellbutrin and lexapro; ambien and ativan prn  - cont to hold lexapro and wellbutrin d/t c/f and risk of serotonin syndrome while on reglan  ??  Seizure history (not taking AEDs), ?seizure activity  -neurology consult >> c/f RUE weakness, MRI 7/27- no acute pathology  -vEEG negative for seizure x 48hrs >> discont monitoring per neuro 7/25  - MRI with possible sz focus, starting keppra 500 mg daily per neuro recommendations       Pulmonary   #ARDS, Acute resp failure r/t COVID  - tolerating PRVC well  - cont lung protective ventilation strategies (32ml/kg, permissive hypercapnea)  - worsening oxygenation today and desat's with minimal movements.  proned at 3pm     Cardiovascular   h/o HTN   -holding home antihtn  - previously on levo for MAP >65  - last echo 2016 w nml LVEF, enlrgd RV and deprssed RV fxn w mild phtn    Renal   AKI  - oliguric >> fluid bolus, no improvement   - cont foley for accurate I/O during critical illness   - goal euvolemia   - replete electrolytes prn and monitor daily  - nephrology consult: lasix challenge 120 mg x 2 yesterday and today.     Infectious Disease/Autoimmune   #COVID 19 infection  Symptom onset:??7/17  Exposure: unknown  COVID PCR+: 7/17  OSH Admission:??7/22  Day of admission/transfer: 7/22  OSH Meds Given:??dexamethasone  COVID Specific??Meds:??dexamethasone 7/22, remdesivir 7/22  Vaccination status: not vaccinated  ??  Monitoring:  - Special airborne/contact precautions (If unavailable, droplet & contact precautions)  - f/u daily CMP, CBC w/ diff, CRP, D-dimer, DIC, troponin, pro-BNP with weekly ferritin, LDH and cytokine level  - f/u q6h lactate; ABG  ??  Medications:  - completed remdesivir  - cont  decadron 6mg  po  daily x 10d, started 7/22  ??  #CAP/VAP: MSSA   - febrile, leukocytosis   - started cefep/vanc 7/25--> changed to ancef on 7/27  - pan cx for T>38.2   - f/u cx data     1/2 staph epi bactermia likely contaminant:  - will repeat blood cultures neg  - f/u ID recs     Cultures:  Blood Culture, Routine (no units)   Date Value   07/06/2020 No Growth at 24 hours     Lower Respiratory Culture (no units)   Date Value   07/04/2020 3+ Methicillin-Susceptible Staphylococcus aureus (A)     WBC (10*9/L)   Date Value   07/08/2020 15.5 (H)     WBC, UA (/HPF)   Date Value   07/04/2020 3          FEN/GI   -trickle tube feeds >> intmt high residuals  - started reglan x 24hrs -bowel regimen w senna and miralax  -PPI  ??     Malnutrition Assessment: Not done yet.  #Immunocompromised s/p Liver transplant in 2016  -continue tacrolimus- trough 9.5 this morning.  Will lower to 0.5mg  tonight and 1mg /0.5 mg tomorrow   -HOLD cellcept    Heme/Coag   Hypercoagulable state 2/2 covid  -anticoagulation group C, heparin tid   -LE dopplers neg for DVT 7/24  - no evid bleeding    Endocrine   History of DM2  -BG > 400.  Insulin gtt started   ??    Integumentary     #  - WOCN consulted for high risk skin assessment Yes.  - WOCN recs >> pending, ordered 7/25   - cont pressure mitigating precautions per skin policy    Prophylaxis/LDA/Restraints/Consults   Can CVC be removed? No: need for medications requiring central access (e.g. pressors)   Can A-line be removed? No: frequent ABGs  Can Foley be removed? No: Need continuous I/O  Mobility plan: Step 1 - Range of motion    Feeding: Trickle feeds, advance as tolerated  Analgesia: No pain issues  Sedation SAT/SBT: No PEEP > 8  Thromboembolic ppx: Enoxaparin  Head of bed >30 degrees: Yes  Ulcer ppx: Yes, coagulopathy  Glucose within target range: Yes, in range    Does patient need/have an active type/screen? NA    RASS at goal? Yes  [place] Agitation Assessment Scale (RASS) : -1 (07/08/2020  2:50 PM)     Can antipsychotics be stopped? N/A, not on antipsychotics  CAM-ICU Result: Positive (07/08/2020  8:00 AM)      Would hospice care be appropriate for this patient? No, patient improving or expected to improve    Patient Lines/Drains/Airways Status    Active Active Lines, Drains, & Airways     Name:   Placement date:   Placement time:   Site:   Days:    ETT  7.5   06/11/2020    1700     5    CVC Triple Lumen 06/25/2020 Non-tunneled Right Internal jugular   06/24/2020    1945    Internal jugular   5    NG/OG Tube Decompression;Feedings Center mouth   07/03/20    0610    Center mouth   5    Urethral Catheter   07/04/2020    ???    ???   6    Peripheral IV 06/15/2020 Left Antecubital   07/04/2020    1300    Antecubital   6  Arterial Line 06/26/2020 Right Radial   06/25/2020    2315    Radial   5              Patient Lines/Drains/Airways Status    Active Wounds     None                Goals of Care     Code Status: Full Code    Designated Healthcare Decision Maker:  Ms. Imes current decisional capacity for healthcare decision-making is incapacitated. Her designated Educational psychologist) is/are her husband .      Subjective     Intubated, sedated    Objective     Vitals - past 24 hours  Temp:  [36.8 ??C (98.2 ??F)-37.4 ??C (99.4 ??F)] 36.8 ??C (98.2 ??F)  Heart Rate:  [59-71] 71  SpO2 Pulse:  [60-71] 71  Resp:  [12-34] 15  FiO2 (%):  [60 %-100 %] 80 %  SpO2:  [81 %-98 %] 98 % Intake/Output  I/O last 3 completed shifts:  In: 3988.3 [I.V.:1094.3; NG/GT:2225; IV Piggyback:669]  Out: 2030 [Urine:1460; Emesis/NG output:170; Stool:400]     ??  Physical Exam:   General: NAD, well nourished, obese, intubated, sedated   HEENT:  normocephalic, trachea mdl, anicteric, no scleral edema, no conjuctival erythema/drainage, MMM and pink   CV: RRR. S1 and S2 normal, no m/r/c/g. no bruit, or JVD. DP Pulses 2  equal. 1+ BLE edema.  Lungs:   chest rise symm, diminished bases. CTA bilat, no wheezes/crackles/rhonchi. Good air movement.  Skin:   w/d/i, no jaundice present, no rashes, lesions, petechiae or breakdown. no drng, erythema.    Abd: contour, abdomen soft, non-tender and not distended. Normoactive bowel sounds x 4 quads, no rebound tenderness or guarding.   Ext: No cyanosis, bruising, clubbing or edema.   Neuro: perrl 3Sl, mdl, orbital edema, +c/c/g,     Continuous Infusions:   ??? HYDROmorphone 10 mg/hr (07/08/20 1450)   ??? insulin regular infusion 1 unit/mL 7 Units/hr (07/08/20 1456)   ??? midazolam (1 mg/mL) infusion 6 mg/hr (07/08/20 1450)   ??? norepinephrine bitartrate-NS 1.013 mcg/min (07/08/20 0700)       Scheduled Medications:   ??? aspirin  81 mg Enteral tube: gastric  Daily   ??? cefazolin  1 g Intravenous Q12H   ??? chlorhexidine  5 mL Mouth BID   ??? dexamethasone  6 mg Intravenous Q24H   ??? famotidine  20 mg Enteral tube: gastric  Daily   ??? heparin (porcine) for subcutaneous use  7,500 Units Subcutaneous [facility]   ??? levETIRAcetam  500 mg Enteral tube: gastric  Daily   ??? levothyroxine  100 mcg Enteral tube: gastric  daily   ??? LORazepam  4 mg Enteral tube: gastric  Q4H   ??? oxyCODONE  30 mg Enteral tube: gastric  Q4H   ??? polyethylen glycol  17 g Enteral tube: gastric  TID   ??? pravastatin  40 mg Enteral tube: gastric  Daily   ??? senna  2 tablet Enteral tube: gastric  BID   ??? Tacrolimus  1 mg Enteral tube: gastric  Daily   ??? Tacrolimus  0.5 mg Enteral tube: gastric  Nightly (2000)       PRN medications:  dextrose 50 % in water (D50W), dextrose 50 % in water (D50W), HYDROmorphone, midazolam    Data/Imaging Review: Reviewed in Epic and personally interpreted on 07/08/2020. See EMR for detailed results.      Critical Care Attestation  This patient is critically ill or injured with the impairment of vital organ systems such that there is a high probability of imminent or life threatening deterioration in the patient's condition. This patient must remain in the ICU for ongoing evaluation of the comprehensive management plan outlined in this note. I directly provided critical care services as documented in this note and the critical care time spent (45 min) is exclusive of separately billable procedures.    Marcea Rojek Paulino Door, PA

## 2020-07-08 NOTE — Unmapped (Signed)
Brief Inpatient Consult Note         Recommendations          Evelyn Rollins is a 54 y.o. female with PMH of cirrhosis s/p liver transplant in 2016 on chronic immunosuppression (tacrolimus and myfortic), HTN, diabetes, and hypothyroidism on whom neurology was consulted for evaluation of possible seizure.     Pt presented with episodes of seizure-like activity (bilateral upper and lower extremity shaking, upward gaze deviation). EEG was placed subsequently and captured events without epileptic correlate, but unfortunately none that were similar to the initial event prompting consultation. On review of the patient's MRI brain, there is a lesion in the right hippocampus that could certainly act as a seizure focus. In the setting of these imaging findings, the clinical description of the events, and a past history of seizures (for which she had been on Keppra), will recommend restarting Keppra. This has been discussed with transplant hepatology care team and is acceptable from their standpoint.     -Please start Keppra 500 mg daily (renally-dosed)  -Please page the neurology consult pager at the time of discharge so we can arrange outpatient follow up.     This patient was discussed with Dr. Seward Meth, who agrees to the assessment and plan.     We will sign off at this time. Please re-consult if further neurologic questions arise.    Lurlean Horns, MD PGY-4 Neurology

## 2020-07-08 NOTE — Unmapped (Signed)
Nephrology Consult     Requesting provider: Dr Chandra Batch MD  Service requesting consult: MICU  Reason for consult: Worsening renal function      Assessment/Recommendations: Evelyn Rollins is a/an 54 y.o. female with a past medical history notable for AKI      # Non-Oliguric/Anuric AKI (unstable): Likely secondary to   - it seems that patient has shock in the setting of COVID 19; the hypotension may have lead to ischemic damage of the renal system. Patient is making urine; we will recommend a lasix challenge for now. The patient does not seem to be overtly volume overloaded; electrolytes are within acceptable range. No indication of urgent dialysis  -Chart reviewed  -Continue to monitor daily Cr, Dose meds for GFR<15  -Monitor Daily I/Os, Daily weight   -Recommend sending Urine lytes , UOsm, POsm    -Will ideally obtain urine sample and analyze sediment for muddy brown casts, RBC casts, dysmorphic RBCs, WBC casts  --Maintain MAP>65 for optimal renal perfusion.   -Currently no indication for HD              Patrica Duel Gustavus Haskin  Division of Nephrology and Hypertension  [facility]  07/07/2020  11:25 PM      _____________________________________________________________________________________      History of Present Illness: Evelyn Rollins is a/an 54 y.o. female with a past medical hx significant for cirrhosis s/p liver transplant in 2016 on chronic immunosuppressive therapy, DM, htn who was admitted as a transfer from OSH due to acute hypoxic respiratory failure secondary to COVID19 pna. Patient was intubated and sedated during my encounter.      Medications:   Current Facility-Administered Medications   Medication Dose Route Frequency Provider Last Rate Last Admin   ??? aspirin chewable tablet 81 mg  81 mg Enteral tube: gastric  Daily [name], MD   81 mg at 07/07/20 0925   ??? ceFAZolin (ANCEF) IVPB 1 g (premix)  1 g Intravenous Q12H [name], MD   Stopped at 07/07/20 2120   ??? chlorhexidine (PERIDEX) 0.12 % solution 5 mL  5 mL Mouth BID [name], MD   5 mL at 07/07/20 2051   ??? dexamethasone (DECADRON) 4 mg/mL injection 6 mg  6 mg Intravenous Q24H Lanae Crumbly, NP   6 mg at 07/07/20 2000   ??? dextrose 50 % in water (D50W) 50 % solution 0-100 mL  0-100 mL Intravenous Q1H PRN [name], MD       ??? dextrose 50 % in water (D50W) 50 % solution 12.5 g  12.5 g Intravenous Q10 Min PRN Lanae Crumbly, NP       ??? famotidine (PEPCID) tablet 20 mg  20 mg Enteral tube: gastric  Daily [name], MD   20 mg at 07/07/20 3086   ??? heparin (porcine) 7,500 units/0.75 mL syringe  7,500 Units Subcutaneous [facility] [name], MD   7,500 Units at 07/07/20 2110   ??? HYDROmorphone (PF) (DILAUDID) injection 4 mg  4 mg Intravenous Q1H PRN [name], MD   4 mg at 07/07/20 1800   ??? HYDROmorphone 1 mg/mL in 0.9% sodium chloride  0-10 mg/hr Intravenous Continuous [name], MD 10 mL/hr at 07/07/20 2300 10 mg/hr at 07/07/20 2300   ??? insulin regular (HumuLIN,NovoLIN) 100 Units in sodium chloride (NS) 0.9 % 100 mL infusion  0-50 Units/hr Intravenous Continuous [name], MD 9.5 mL/hr at 07/07/20 2300 9.5 Units/hr at  07/07/20 2300   ??? levothyroxine (SYNTHROID) tablet 100 mcg  100 mcg Enteral tube: gastric  daily Lanae Crumbly, NP   100 mcg at 07/07/20 0525   ??? LORazepam (ATIVAN) tablet 4 mg  4 mg Enteral tube: gastric  Q4H [name], MD   4 mg at 07/07/20 2052   ??? midazolam (VERSED) injection 5 mg  5 mg Intravenous Q1H PRN [name], MD   5 mg at 07/07/20 1101   ??? midazolam in sodium chloride 0.9% (1 mg/mL) infusion PMB  0-6 mg/hr Intravenous Continuous [name], MD 6 mL/hr at 07/07/20 2300 6 mg/hr at 07/07/20 2300   ??? norepinephrine 8 mg in sodium chloride 0.9 % 250 mL (59mcg/mL) infusion PMB  0-30 mcg/min Intravenous Continuous Lanae Crumbly, NP 1.9 mL/hr at 07/07/20 2300 1.013 mcg/min at 07/07/20 2300   ??? oxyCODONE (ROXICODONE) immediate release tablet 30 mg  30 mg Enteral tube: gastric  Q4H [name], MD   30 mg at 07/07/20 2052   ??? polyethylene glycol (MIRALAX) packet 17 g  17 g Enteral tube: gastric  TID [name], MD   17 g at 07/07/20 2053   ??? pravastatin (PRAVACHOL) tablet 40 mg  40 mg Enteral tube: gastric  Daily [name], MD   40 mg at 07/07/20 0900   ??? senna (SENOKOT) tablet 2 tablet  2 tablet Enteral tube: gastric  BID [name], MD   2 tablet at 07/07/20 0842   ??? [START ON 07/08/2020] tacrolimus (PROGRAF) oral suspension  1 mg Enteral tube: gastric  Daily [name], MD       ??? tacrolimus (PROGRAF) oral suspension  0.5 mg Enteral tube: gastric  Nightly (2000) [name], MD   0.5 mg at 07/07/20 2052        ALLERGIES  Patient has no known allergies.    MEDICAL HISTORY  Past Medical History:   Diagnosis Date   ??? Cirrhosis (CMS-HCC)    ??? Hypothyroidism    ??? NAFLD (nonalcoholic fatty liver disease)    ??? Obesity    ??? Seizure (CMS-HCC) Dec 2015        SOCIAL HISTORY  Social History     Socioeconomic History   ??? Marital status: Married     Spouse name: Not on file   ??? Number of children: Not on file   ??? Years of education: Not on file   ??? Highest education level: Not on file   Occupational History   ??? Not on file   Tobacco Use   ??? Smoking status: Never Smoker   ??? Smokeless tobacco: Never Used   Substance and Sexual Activity   ??? Alcohol use: No     Alcohol/week: 0.0 standard drinks   ??? Drug use: No   ??? Sexual activity: Not on file   Other Topics Concern   ??? Not on file   Social History Narrative    Living situation: the patient lives with her husband.    Address [place], [place], [place]): [place], [place], [place]    Guardian/Payee: None        Family Contact: none provided    Outpatient Providers: no psychiatric providers    Relationship Status: Married     Children: Yes; two children and one stepchild Education: HS graduate    Income/Employment/Disability: Doesn't currently work. 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 Informant: the patient     Domestic Violence: None known. Informant: the patient     Exposure/Witness to Violence: None known    Protective Services Involvement: None known    Current/Prior Legal: None known    Physical Aggression/Violence: None known    Access to Firearms: Yes, Unsecured     Gang Involvement: None known        Psychiatric History:    Diagnoses:    None        Inpatient hospitalizations:    None        Outpatient treatment:    None        Medication trials:    None per patient. Per medical chart, she received Valium for a brief period of time for some anxiety.        Suicide attempts/Self-injury:    None        Substance Use:    None        Family Psychiatric History:    None                 Social Determinants of Health     Financial Resource Strain: Low Risk    ??? Difficulty of Paying Living Expenses: Not very hard   Food Insecurity: No Food Insecurity   ??? Worried About Running Out of Food in the Last Year: Never true   ??? Ran Out of Food in the Last Year: Never true   Transportation Needs: No Transportation Needs   ??? Lack of Transportation (Medical): No   ??? Lack of Transportation (Non-Medical): No   Physical Activity:    ??? Days of Exercise per Week:    ??? Minutes of Exercise per Session:    Stress:    ??? Feeling of Stress :    Social Connections:    ??? Frequency of Communication with Friends and Family:    ??? Frequency of Social Gatherings with Friends and Family:    ??? Attends Religious Services:    ??? Database administrator or Organizations:    ??? Attends Engineer, structural:    ??? Marital Status:         FAMILY HISTORY  Family History   Problem Relation Age of Onset   ??? Breast cancer Mother    ??? Thyroid cancer Mother    ??? Ovarian cancer Mother    ??? Cancer Mother    ??? COPD Father    ??? Diabetes Father    ??? Melanoma Father    ??? Other Father         pacemaker            Review of Systems:  Review of systems was unobtainable due to patient's altered mental status  Otherwise as per HPI, all other systems reviewed and negative    Physical Exam:  Vitals:    07/07/20 2300   BP:    Pulse: 61   Resp: 30   Temp:    SpO2: 96%     I/O this shift:  In: 753.1 [I.V.:363.1; NG/GT:340; IV Piggyback:50]  Out: 250 [Urine:250]    Intake/Output Summary (Last 24 hours) at 07/07/2020 2325  Last data filed at 07/07/2020 2300  Gross per 24 hour   Intake 2789.53 ml   Output 1120 ml   Net 1669.53 ml     General: well-appearing, no acute distress  HEENT: anicteric sclera, oropharynx clear without lesions  CV: regular rate, normal rhythm, no murmurs, no gallops,  no rubs  Lungs: no crackles appreciated  Abd: soft, non-tender, non-distended  Skin: no visible lesions or rashes  Musculoskeletal: +1 pitting edema      Test Results  Reviewed  Lab Results   Component Value Date    NA 144 07/07/2020    NA 143 07/07/2020    K 4.5 07/07/2020    K 4.8 (H) 07/07/2020    CL 114 (H) 07/07/2020    CO2 21.0 07/07/2020    BUN 106 (H) 07/07/2020    CREATININE 3.85 (H) 07/07/2020    GLU 207 (H) 07/07/2020    CALCIUM 8.1 (L) 07/07/2020    ALBUMIN 1.8 (L) 07/07/2020    PHOS 4.4 07/07/2020         I have reviewed all relevant outside healthcare records related to the patient's kidney injury.

## 2020-07-09 LAB — D-DIMER QUANTITATIVE (CW,ML,HL): Lab: 1890 — ABNORMAL HIGH

## 2020-07-09 LAB — ENDOTOOL
ENDOTOOL GLUCOSE: 96 mg/dL — ABNORMAL LOW (ref 110–140)
ENDOTOOL GLUCOSE: 122 mg/dL (ref 110–140)
ENDOTOOL GLUCOSE: 91 mg/dL — ABNORMAL LOW (ref 110–140)
ENDOTOOL INSULIN RATE: 0 U/h
ENDOTOOL INSULIN RATE: 0 U/h
ENDOTOOL INSULIN RATE: 0 U/h
ENDOTOOL INSULIN RATE: 0 U/h
ENDOTOOL INSULIN RATE: 3.6 U/h
ENDOTOOL INSULIN RATE: 4.2 U/h
ENDOTOOL INSULIN RATE: 5 U/h
ENDOTOOL INSULIN RATE: 7.5 U/h
ENDOTOOL INSULIN RATE: 7.5 U/h
ENDOTOOL INSULIN RATE: 8 U/h
ENDOTOOL INSULIN RATE: 9.5 U/h
RECOVERY CARBS: 7.5 g

## 2020-07-09 LAB — FIO2 ARTERIAL

## 2020-07-09 LAB — COMPREHENSIVE METABOLIC PANEL
ALBUMIN: 1.9 g/dL — ABNORMAL LOW (ref 3.4–5.0)
ALBUMIN: 1.7 g/dL — ABNORMAL LOW (ref 3.4–5.0)
ALBUMIN: 1.9 g/dL — ABNORMAL LOW (ref 3.4–5.0)
ALKALINE PHOSPHATASE: 60 U/L (ref 46–116)
ALKALINE PHOSPHATASE: 53 U/L (ref 46–116)
ALKALINE PHOSPHATASE: 54 U/L (ref 46–116)
ALT (SGPT): 18 U/L (ref 10–49)
ALT (SGPT): 15 U/L (ref 10–49)
ALT (SGPT): 13 U/L (ref 10–49)
ANION GAP: 13 mmol/L (ref 5–14)
ANION GAP: 9 mmol/L (ref 5–14)
ANION GAP: 10 mmol/L (ref 5–14)
AST (SGOT): 52 U/L — ABNORMAL HIGH (ref <=34)
AST (SGOT): 44 U/L — ABNORMAL HIGH (ref <=34)
AST (SGOT): 43 U/L — ABNORMAL HIGH (ref <=34)
BILIRUBIN TOTAL: 0.2 mg/dL — ABNORMAL LOW (ref 0.3–1.2)
BILIRUBIN TOTAL: 0.2 mg/dL — ABNORMAL LOW (ref 0.3–1.2)
BILIRUBIN TOTAL: 0.2 mg/dL — ABNORMAL LOW (ref 0.3–1.2)
BLOOD UREA NITROGEN: 138 mg/dL — ABNORMAL HIGH (ref 9–23)
BLOOD UREA NITROGEN: 147 mg/dL — ABNORMAL HIGH (ref 9–23)
BLOOD UREA NITROGEN: 130 mg/dL — ABNORMAL HIGH (ref 9–23)
BUN / CREAT RATIO: 29
BUN / CREAT RATIO: 28
CALCIUM: 8.1 mg/dL — ABNORMAL LOW (ref 8.7–10.4)
CALCIUM: 8 mg/dL — ABNORMAL LOW (ref 8.7–10.4)
CHLORIDE: 110 mmol/L — ABNORMAL HIGH (ref 98–107)
CHLORIDE: 110 mmol/L — ABNORMAL HIGH (ref 98–107)
CO2: 21 mmol/L (ref 20.0–31.0)
CO2: 20 mmol/L (ref 20.0–31.0)
CO2: 20 mmol/L (ref 20.0–31.0)
CREATININE: 4.73 mg/dL — ABNORMAL HIGH
CREATININE: 4.81 mg/dL — ABNORMAL HIGH
CREATININE: 4.59 mg/dL — ABNORMAL HIGH
EGFR CKD-EPI AA FEMALE: 11 mL/min/{1.73_m2} — ABNORMAL LOW (ref >=60)
EGFR CKD-EPI AA FEMALE: 11 mL/min/{1.73_m2} — ABNORMAL LOW (ref >=60)
EGFR CKD-EPI AA FEMALE: 12 mL/min/{1.73_m2} — ABNORMAL LOW (ref >=60)
EGFR CKD-EPI NON-AA FEMALE: 10 mL/min/{1.73_m2} — ABNORMAL LOW (ref >=60)
EGFR CKD-EPI NON-AA FEMALE: 10 mL/min/{1.73_m2} — ABNORMAL LOW (ref >=60)
EGFR CKD-EPI NON-AA FEMALE: 10 mL/min/{1.73_m2} — ABNORMAL LOW (ref >=60)
GLUCOSE RANDOM: 115 mg/dL (ref 70–179)
GLUCOSE RANDOM: 175 mg/dL (ref 70–179)
POTASSIUM: 5.2 mmol/L — ABNORMAL HIGH (ref 3.4–4.5)
POTASSIUM: 5.2 mmol/L — ABNORMAL HIGH (ref 3.4–4.5)
POTASSIUM: 5.2 mmol/L — ABNORMAL HIGH (ref 3.4–4.5)
PROTEIN TOTAL: 5.3 g/dL — ABNORMAL LOW (ref 5.7–8.2)
PROTEIN TOTAL: 5.6 g/dL — ABNORMAL LOW (ref 5.7–8.2)
SODIUM: 144 mmol/L (ref 135–145)
SODIUM: 139 mmol/L (ref 135–145)
SODIUM: 142 mmol/L (ref 135–145)

## 2020-07-09 LAB — ENDOTOOL NEXT GLUCOSE

## 2020-07-09 LAB — BLOOD GAS CRITICAL CARE PANEL, ARTERIAL
BASE EXCESS ARTERIAL: -6.3 — ABNORMAL LOW (ref -2.0–2.0)
BASE EXCESS ARTERIAL: -5.6 — ABNORMAL LOW (ref -2.0–2.0)
BASE EXCESS ARTERIAL: -6.4 — ABNORMAL LOW (ref -2.0–2.0)
BASE EXCESS ARTERIAL: -8.4 — ABNORMAL LOW (ref -2.0–2.0)
BASE EXCESS ARTERIAL: -9.6 — ABNORMAL LOW (ref -2.0–2.0)
BASE EXCESS ARTERIAL: -9.3 — ABNORMAL LOW (ref -2.0–2.0)
BASE EXCESS ARTERIAL: -8 — ABNORMAL LOW (ref -2.0–2.0)
CALCIUM IONIZED ARTERIAL (MG/DL): 5.06 mg/dL (ref 4.40–5.40)
CALCIUM IONIZED ARTERIAL (MG/DL): 4.65 mg/dL (ref 4.40–5.40)
CALCIUM IONIZED ARTERIAL (MG/DL): 4.83 mg/dL (ref 4.40–5.40)
CALCIUM IONIZED ARTERIAL (MG/DL): 4.72 mg/dL (ref 4.40–5.40)
CALCIUM IONIZED ARTERIAL (MG/DL): 4.78 mg/dL (ref 4.40–5.40)
CALCIUM IONIZED ARTERIAL (MG/DL): 4.53 mg/dL (ref 4.40–5.40)
GLUCOSE WHOLE BLOOD: 117 mg/dL (ref 70–179)
GLUCOSE WHOLE BLOOD: 116 mg/dL (ref 70–179)
GLUCOSE WHOLE BLOOD: 183 mg/dL — ABNORMAL HIGH (ref 70–179)
GLUCOSE WHOLE BLOOD: 225 mg/dL — ABNORMAL HIGH (ref 70–179)
GLUCOSE WHOLE BLOOD: 209 mg/dL — ABNORMAL HIGH (ref 70–179)
GLUCOSE WHOLE BLOOD: 235 mg/dL — ABNORMAL HIGH (ref 70–179)
HCO3 ARTERIAL: 20 mmol/L — ABNORMAL LOW (ref 22–27)
HCO3 ARTERIAL: 20 mmol/L — ABNORMAL LOW (ref 22–27)
HCO3 ARTERIAL: 17 mmol/L — ABNORMAL LOW (ref 22–27)
HCO3 ARTERIAL: 17 mmol/L — ABNORMAL LOW (ref 22–27)
HCO3 ARTERIAL: 17 mmol/L — ABNORMAL LOW (ref 22–27)
HCO3 ARTERIAL: 19 mmol/L — ABNORMAL LOW (ref 22–27)
HEMOGLOBIN BLOOD GAS: 11.5 g/dL — ABNORMAL LOW (ref 12.00–16.00)
HEMOGLOBIN BLOOD GAS: 11 g/dL — ABNORMAL LOW (ref 12.00–16.00)
HEMOGLOBIN BLOOD GAS: 10.2 g/dL — ABNORMAL LOW (ref 12.00–16.00)
HEMOGLOBIN BLOOD GAS: 11 g/dL — ABNORMAL LOW (ref 12.00–16.00)
HEMOGLOBIN BLOOD GAS: 10.9 g/dL — ABNORMAL LOW (ref 12.00–16.00)
HEMOGLOBIN BLOOD GAS: 13.6 g/dL (ref 12.00–16.00)
HEMOGLOBIN BLOOD GAS: 11 g/dL — ABNORMAL LOW (ref 12.00–16.00)
LACTATE BLOOD ARTERIAL: 1.6 mmol/L — ABNORMAL HIGH (ref <1.3)
LACTATE BLOOD ARTERIAL: 2 mmol/L — ABNORMAL HIGH (ref <1.3)
LACTATE BLOOD ARTERIAL: 1.8 mmol/L — ABNORMAL HIGH (ref <1.3)
LACTATE BLOOD ARTERIAL: 1.4 mmol/L — ABNORMAL HIGH (ref <1.3)
LACTATE BLOOD ARTERIAL: 1.7 mmol/L — ABNORMAL HIGH (ref <1.3)
LACTATE BLOOD ARTERIAL: 1.6 mmol/L — ABNORMAL HIGH (ref <1.3)
O2 SATURATION ARTERIAL: 97.1 % (ref 94.0–100.0)
O2 SATURATION ARTERIAL: 97.4 % (ref 94.0–100.0)
O2 SATURATION ARTERIAL: 98.2 % (ref 94.0–100.0)
O2 SATURATION ARTERIAL: 98.4 % (ref 94.0–100.0)
O2 SATURATION ARTERIAL: 87.9 % — ABNORMAL LOW (ref 94.0–100.0)
O2 SATURATION ARTERIAL: 97.3 % (ref 94.0–100.0)
O2 SATURATION ARTERIAL: 98 % (ref 94.0–100.0)
PCO2 ARTERIAL: 46.4 mmHg — ABNORMAL HIGH (ref 35.0–45.0)
PCO2 ARTERIAL: 43.8 mmHg (ref 35.0–45.0)
PCO2 ARTERIAL: 40.8 mmHg (ref 35.0–45.0)
PCO2 ARTERIAL: 39.6 mmHg (ref 35.0–45.0)
PCO2 ARTERIAL: 43.4 mmHg (ref 35.0–45.0)
PCO2 ARTERIAL: 43.8 mmHg (ref 35.0–45.0)
PCO2 ARTERIAL: 44.1 mmHg (ref 35.0–45.0)
PH ARTERIAL: 7.29 — ABNORMAL LOW (ref 7.35–7.45)
PH ARTERIAL: 7.24 — ABNORMAL LOW (ref 7.35–7.45)
PH ARTERIAL: 7.25 — ABNORMAL LOW (ref 7.35–7.45)
PO2 ARTERIAL: 97.4 mmHg (ref 80.0–110.0)
PO2 ARTERIAL: 108 mmHg (ref 80.0–110.0)
PO2 ARTERIAL: 107 mmHg (ref 80.0–110.0)
PO2 ARTERIAL: 124 mmHg — ABNORMAL HIGH (ref 80.0–110.0)
PO2 ARTERIAL: 61.4 mmHg — ABNORMAL LOW (ref 80.0–110.0)
PO2 ARTERIAL: 253 mmHg — ABNORMAL HIGH (ref 80.0–110.0)
PO2 ARTERIAL: 115 mmHg — ABNORMAL HIGH (ref 80.0–110.0)
POTASSIUM WHOLE BLOOD: 5.1 mmol/L — ABNORMAL HIGH (ref 3.4–4.6)
POTASSIUM WHOLE BLOOD: 5.3 mmol/L — ABNORMAL HIGH (ref 3.4–4.6)
POTASSIUM WHOLE BLOOD: 4.9 mmol/L — ABNORMAL HIGH (ref 3.4–4.6)
POTASSIUM WHOLE BLOOD: 5 mmol/L — ABNORMAL HIGH (ref 3.4–4.6)
POTASSIUM WHOLE BLOOD: 4.9 mmol/L — ABNORMAL HIGH (ref 3.4–4.6)
POTASSIUM WHOLE BLOOD: 4.8 mmol/L — ABNORMAL HIGH (ref 3.4–4.6)
POTASSIUM WHOLE BLOOD: 5.1 mmol/L — ABNORMAL HIGH (ref 3.4–4.6)
SODIUM WHOLE BLOOD: 145 mmol/L (ref 135–145)
SODIUM WHOLE BLOOD: 141 mmol/L (ref 135–145)
SODIUM WHOLE BLOOD: 144 mmol/L (ref 135–145)
SODIUM WHOLE BLOOD: 144 mmol/L (ref 135–145)
SODIUM WHOLE BLOOD: 140 mmol/L (ref 135–145)
SODIUM WHOLE BLOOD: 144 mmol/L (ref 135–145)

## 2020-07-09 LAB — BUN / CREAT RATIO: Urea nitrogen/Creatinine:MRto:Pt:Ser/Plas:Qn:: 31

## 2020-07-09 LAB — ENDOTOOL INSULIN RATE
Lab: 0
Lab: 4.2

## 2020-07-09 LAB — ENDOTOOL GLUCOSE
Lab: 89 — ABNORMAL LOW
Lab: 96 — ABNORMAL LOW

## 2020-07-09 LAB — CBC W/ AUTO DIFF
BASOPHILS ABSOLUTE COUNT: 0.1 10*9/L (ref 0.0–0.1)
BASOPHILS RELATIVE PERCENT: 0.3 %
EOSINOPHILS ABSOLUTE COUNT: 0 10*9/L (ref 0.0–0.4)
HEMATOCRIT: 37 % (ref 36.0–46.0)
LARGE UNSTAINED CELLS: 1 % (ref 0–4)
LYMPHOCYTES ABSOLUTE COUNT: 0.5 10*9/L — ABNORMAL LOW (ref 1.5–5.0)
LYMPHOCYTES RELATIVE PERCENT: 2.7 %
MEAN CORPUSCULAR HEMOGLOBIN CONC: 31.3 g/dL (ref 31.0–37.0)
MEAN CORPUSCULAR HEMOGLOBIN: 28.7 pg (ref 26.0–34.0)
MEAN CORPUSCULAR VOLUME: 91.4 fL (ref 80.0–100.0)
MEAN PLATELET VOLUME: 8.6 fL (ref 7.0–10.0)
MONOCYTES ABSOLUTE COUNT: 0.5 10*9/L (ref 0.2–0.8)
MONOCYTES RELATIVE PERCENT: 3 %
NEUTROPHILS ABSOLUTE COUNT: 16.8 10*9/L — ABNORMAL HIGH (ref 2.0–7.5)
NEUTROPHILS RELATIVE PERCENT: 93.4 %
PLATELET COUNT: 496 10*9/L — ABNORMAL HIGH (ref 150–440)
RED BLOOD CELL COUNT: 4.05 10*12/L (ref 4.00–5.20)
WBC ADJUSTED: 18 10*9/L — ABNORMAL HIGH (ref 4.5–11.0)

## 2020-07-09 LAB — BASE EXCESS ARTERIAL: Base excess:SCnc:Pt:BldA:Qn:Calculated: -6.3 — ABNORMAL LOW

## 2020-07-09 LAB — GLUCOSE WHOLE BLOOD: Glucose:MCnc:Pt:Bld:Qn:: 209 — ABNORMAL HIGH

## 2020-07-09 LAB — ALBUMIN: Albumin:MCnc:Pt:Ser/Plas:Qn:: 1.9 — ABNORMAL LOW

## 2020-07-09 LAB — PCO2 ARTERIAL: Carbon dioxide:PPres:Pt:BldA:Qn:: 43.4

## 2020-07-09 LAB — CHLORIDE: Chloride:SCnc:Pt:Ser/Plas:Qn:: 110 — ABNORMAL HIGH

## 2020-07-09 LAB — MAGNESIUM
Magnesium:MCnc:Pt:Ser/Plas:Qn:: 2.7 — ABNORMAL HIGH
Magnesium:MCnc:Pt:Ser/Plas:Qn:: 2.7 — ABNORMAL HIGH

## 2020-07-09 LAB — HCO3 ARTERIAL: Bicarbonate:SCnc:Pt:BldA:Qn:: 20 — ABNORMAL LOW

## 2020-07-09 LAB — NEUTROPHILS ABSOLUTE COUNT: Neutrophils:NCnc:Pt:Bld:Qn:Automated count: 16.8 — ABNORMAL HIGH

## 2020-07-09 LAB — APTT: Coagulation surface induced:Time:Pt:PPP:Qn:Coag: 124.3 — ABNORMAL HIGH

## 2020-07-09 LAB — PHOSPHORUS
Phosphate:MCnc:Pt:Ser/Plas:Qn:: 6 — ABNORMAL HIGH
Phosphate:MCnc:Pt:Ser/Plas:Qn:: 6.9 — ABNORMAL HIGH

## 2020-07-09 LAB — LACTATE BLOOD ARTERIAL: Lactate:SCnc:Pt:BldA:Qn:: 1.8 — ABNORMAL HIGH

## 2020-07-09 LAB — PH ARTERIAL: pH:LsCnc:Pt:BldA:Qn:: 7.28 — ABNORMAL LOW

## 2020-07-09 LAB — TACROLIMUS BLOOD: Lab: 10.7

## 2020-07-09 LAB — RECOVERY CARBS: Lab: 8

## 2020-07-09 MED ADMIN — propofol (DIPRIVAN) infusion 10 mg/mL: 0-50 ug/kg/min | INTRAVENOUS | @ 13:00:00

## 2020-07-09 MED ADMIN — oxyCODONE (ROXICODONE) immediate release tablet 60 mg: 60 mg | GASTROENTERAL | @ 18:00:00 | Stop: 2020-07-17

## 2020-07-09 MED ADMIN — HYDROmorphone 1 mg/mL in 0.9% sodium chloride: 0-10 mg/h | INTRAVENOUS | @ 17:00:00 | Stop: 2020-07-16

## 2020-07-09 MED ADMIN — oxyCODONE (ROXICODONE) immediate release tablet 30 mg: 30 mg | GASTROENTERAL | @ 01:00:00 | Stop: 2020-07-17

## 2020-07-09 MED ADMIN — VECuronium (NORCURON) 10 mg injection: INTRAVENOUS | @ 02:00:00 | Stop: 2020-07-08

## 2020-07-09 MED ADMIN — insulin regular (HumuLIN,NovoLIN) 100 Units in sodium chloride (NS) 0.9 % 100 mL infusion: 0-50 [IU]/h | INTRAVENOUS | @ 01:00:00

## 2020-07-09 MED ADMIN — ceFAZolin (ANCEF) IVPB 2 g in 50 ml dextrose (premix): 2 g | INTRAVENOUS | @ 13:00:00 | Stop: 2020-07-09

## 2020-07-09 MED ADMIN — HYDROmorphone (PF) (DILAUDID) injection 4 mg: 4 mg | INTRAVENOUS | @ 08:00:00 | Stop: 2020-07-09

## 2020-07-09 MED ADMIN — ceFAZolin (ANCEF) IVPB 1 g (premix): 1 g | INTRAVENOUS | @ 01:00:00 | Stop: 2020-07-12

## 2020-07-09 MED ADMIN — VECuronium (NORCURON) injection 10 mg: 10 mg | INTRAVENOUS | @ 05:00:00 | Stop: 2020-07-09

## 2020-07-09 MED ADMIN — midazolam (VERSED) injection 5 mg: 5 mg | INTRAVENOUS | @ 11:00:00 | Stop: 2020-07-09

## 2020-07-09 MED ADMIN — midazolam (VERSED) injection 5 mg: 5 mg | INTRAVENOUS | @ 08:00:00 | Stop: 2020-07-09

## 2020-07-09 MED ADMIN — LORazepam (ATIVAN) tablet 8 mg: 8 mg | GASTROENTERAL | @ 15:00:00

## 2020-07-09 MED ADMIN — polyethylene glycol (MIRALAX) packet 17 g: 17 g | GASTROENTERAL | @ 13:00:00

## 2020-07-09 MED ADMIN — LORazepam (ATIVAN) tablet 4 mg: 4 mg | GASTROENTERAL | @ 04:00:00

## 2020-07-09 MED ADMIN — heparin (porcine) 1000 unit/mL injection 4,000 Units: 4000 [IU] | INTRAVENOUS | @ 18:00:00 | Stop: 2020-07-09

## 2020-07-09 MED ADMIN — LORazepam (ATIVAN) tablet 8 mg: 8 mg | GASTROENTERAL | @ 13:00:00

## 2020-07-09 MED ADMIN — midazolam (VERSED) injection 10 mg: 10 mg | INTRAVENOUS | @ 20:00:00

## 2020-07-09 MED ADMIN — propofoL (DIPRIVAN) 10 mg/mL injection: INTRAVENOUS | @ 13:00:00 | Stop: 2020-07-09

## 2020-07-09 MED ADMIN — HYDROmorphone 1 mg/mL in 0.9% sodium chloride: 0-10 mg/h | INTRAVENOUS | @ 01:00:00 | Stop: 2020-07-16

## 2020-07-09 MED ADMIN — pravastatin (PRAVACHOL) tablet 40 mg: 40 mg | GASTROENTERAL | @ 13:00:00

## 2020-07-09 MED ADMIN — chlorhexidine (PERIDEX) 0.12 % solution 5 mL: 5 mL | OROMUCOSAL | @ 23:00:00

## 2020-07-09 MED ADMIN — LORazepam (ATIVAN) tablet 4 mg: 4 mg | GASTROENTERAL | @ 08:00:00 | Stop: 2020-07-09

## 2020-07-09 MED ADMIN — oxyCODONE (ROXICODONE) immediate release tablet 30 mg: 30 mg | GASTROENTERAL | @ 08:00:00 | Stop: 2020-07-09

## 2020-07-09 MED ADMIN — levETIRAcetam (KEPPRA) tablet 500 mg: 500 mg | GASTROENTERAL | @ 13:00:00

## 2020-07-09 MED ADMIN — HYDROmorphone 1 mg/mL in 0.9% sodium chloride: 0-10 mg/h | INTRAVENOUS | @ 21:00:00 | Stop: 2020-07-16

## 2020-07-09 MED ADMIN — VECuronium (NORCURON) injection 10 mg: 10 mg | INTRAVENOUS | @ 21:00:00 | Stop: 2020-07-09

## 2020-07-09 MED ADMIN — oxyCODONE (ROXICODONE) immediate release tablet 30 mg: 30 mg | GASTROENTERAL | @ 04:00:00 | Stop: 2020-07-17

## 2020-07-09 MED ADMIN — heparin (porcine) 7,500 units/0.75 mL syringe: 7500 [IU] | SUBCUTANEOUS | @ 10:00:00 | Stop: 2020-07-09

## 2020-07-09 MED ADMIN — HYDROmorphone 1 mg/mL in 0.9% sodium chloride: 0-10 mg/h | INTRAVENOUS | @ 12:00:00 | Stop: 2020-07-16

## 2020-07-09 MED ADMIN — heparin (porcine) 7,500 units/0.75 mL syringe: 7500 [IU] | SUBCUTANEOUS | @ 01:00:00

## 2020-07-09 MED ADMIN — VECuronium (NORCURON) 10 mg injection: INTRAVENOUS | @ 21:00:00 | Stop: 2020-07-09

## 2020-07-09 MED ADMIN — HYDROmorphone (PF) (DILAUDID) injection 4 mg: 4 mg | INTRAVENOUS | @ 01:00:00

## 2020-07-09 MED ADMIN — midazolam (VERSED) injection 10 mg: 10 mg | INTRAVENOUS | @ 22:00:00

## 2020-07-09 MED ADMIN — dexamethasone (DECADRON) 4 mg/mL injection 6 mg: 6 mg | INTRAVENOUS | @ 22:00:00 | Stop: 2020-07-12

## 2020-07-09 MED ADMIN — polyethylene glycol (MIRALAX) packet 17 g: 17 g | GASTROENTERAL | @ 18:00:00

## 2020-07-09 MED ADMIN — HYDROmorphone (PF) (DILAUDID) injection 4 mg: 4 mg | INTRAVENOUS | @ 11:00:00 | Stop: 2020-07-09

## 2020-07-09 MED ADMIN — VECuronium (NORCURON) injection 10 mg: 10 mg | INTRAVENOUS | @ 02:00:00 | Stop: 2020-07-08

## 2020-07-09 MED ADMIN — heparin 25,000 Units/250 mL (100 units/mL) in 0.45% saline infusion (premade): 12 [IU]/kg/h | INTRAVENOUS | @ 18:00:00

## 2020-07-09 MED ADMIN — NxStage RFP 401 (+/- BB) 5000 mL - contains 4 mEq/L of potassium dialysis solution 5,000 mL: 5000 mL | INTRAVENOUS_CENTRAL

## 2020-07-09 MED ADMIN — QUEtiapine (SEROquel) tablet 50 mg: 50 mg | ORAL | @ 13:00:00

## 2020-07-09 MED ADMIN — HYDROmorphone 1 mg/mL in 0.9% sodium chloride: 0-10 mg/h | INTRAVENOUS | @ 07:00:00 | Stop: 2020-07-16

## 2020-07-09 MED ADMIN — LORazepam (ATIVAN) tablet 8 mg: 8 mg | GASTROENTERAL | @ 20:00:00

## 2020-07-09 MED ADMIN — HYDROmorphone (PF) (DILAUDID) injection 8 mg: 8 mg | INTRAVENOUS | @ 15:00:00

## 2020-07-09 MED ADMIN — dextrose 50 % in water (D50W) 50 % solution 12.5 g: 12.5 g | INTRAVENOUS | @ 16:00:00

## 2020-07-09 MED ADMIN — chlorhexidine (PERIDEX) 0.12 % solution 5 mL: 5 mL | OROMUCOSAL | @ 01:00:00

## 2020-07-09 MED ADMIN — insulin regular (HumuLIN,NovoLIN) injection 0-12 Units: 0-12 [IU] | SUBCUTANEOUS | @ 19:00:00

## 2020-07-09 MED ADMIN — oxyCODONE (ROXICODONE) immediate release tablet 60 mg: 60 mg | GASTROENTERAL | @ 13:00:00 | Stop: 2020-07-17

## 2020-07-09 MED ADMIN — midazolam in sodium chloride 0.9% (1 mg/mL) infusion PMB: 0-10 mg/h | INTRAVENOUS | @ 17:00:00

## 2020-07-09 MED ADMIN — famotidine (PEPCID) tablet 20 mg: 20 mg | GASTROENTERAL | @ 13:00:00

## 2020-07-09 MED ADMIN — VECuronium (NORCURON) 10 mg injection: INTRAVENOUS | @ 05:00:00 | Stop: 2020-07-09

## 2020-07-09 MED ADMIN — buPROPion (WELLBUTRIN) tablet 100 mg: 100 mg | GASTROENTERAL | @ 13:00:00

## 2020-07-09 MED ADMIN — oxyCODONE (ROXICODONE) immediate release tablet 60 mg: 60 mg | GASTROENTERAL | @ 21:00:00 | Stop: 2020-07-17

## 2020-07-09 MED ADMIN — midazolam (VERSED) injection 10 mg: 10 mg | INTRAVENOUS | @ 15:00:00

## 2020-07-09 MED ADMIN — chlorhexidine (PERIDEX) 0.12 % solution 5 mL: 5 mL | OROMUCOSAL | @ 13:00:00

## 2020-07-09 MED ADMIN — buPROPion (WELLBUTRIN) tablet 100 mg: 100 mg | GASTROENTERAL | @ 18:00:00

## 2020-07-09 NOTE — Unmapped (Signed)
Pt. Sedated with Dilaudid and Versed. Remains on Endo tool / Insulin drip. Pt. Lung sounds diminished. Received Vecuronium x 2 and PRN meds per [facility] for vent synchrony. Proned. NSR on monitor. BP stable. Levophed off currently. Hypoactive bowel sounds. High residuals of 450 (discarded). MD states to lower continuous TF rate to 20. Residuals following were around 200. TF continued at 20 ml/hr. Foley in place. Receiving Lasix 120 mg. Pt. Remains critical. Will continue to monitor.     Problem: Adult Inpatient Plan of Care  Goal: Plan of Care Review  07/09/2020 0453 by Ancil Linsey, RN  Outcome: Ongoing - Unchanged  07/09/2020 0452 by Ancil Linsey, RN  Outcome: Ongoing - Unchanged  Goal: Patient-Specific Goal (Individualization)  07/09/2020 0453 by Ancil Linsey, RN  Outcome: Ongoing - Unchanged  07/09/2020 0452 by Ancil Linsey, RN  Outcome: Ongoing - Unchanged  Goal: Absence of Hospital-Acquired Illness or Injury  07/09/2020 0453 by Ancil Linsey, RN  Outcome: Ongoing - Unchanged  07/09/2020 0452 by Ancil Linsey, RN  Outcome: Ongoing - Unchanged  Goal: Optimal Comfort and Wellbeing  07/09/2020 0453 by Ancil Linsey, RN  Outcome: Ongoing - Unchanged  07/09/2020 0452 by Ancil Linsey, RN  Outcome: Ongoing - Unchanged  Goal: Readiness for Transition of Care  07/09/2020 0453 by Ancil Linsey, RN  Outcome: Ongoing - Unchanged  07/09/2020 0452 by Ancil Linsey, RN  Outcome: Ongoing - Unchanged  Goal: Rounds/Family Conference  07/09/2020 0453 by Ancil Linsey, RN  Outcome: Ongoing - Unchanged  07/09/2020 0452 by Ancil Linsey, RN  Outcome: Ongoing - Unchanged     Problem: Skin Injury Risk Increased  Goal: Skin Health and Integrity  07/09/2020 0453 by Ancil Linsey, RN  Outcome: Ongoing - Unchanged  07/09/2020 0452 by Ancil Linsey, RN  Outcome: Ongoing - Unchanged     Problem: Infection  Goal: Infection Symptom Resolution  07/09/2020 0453 by Ancil Linsey, RN  Outcome: Ongoing - Unchanged  07/09/2020 0452 by Ancil Linsey, RN  Outcome: Ongoing - Unchanged     Problem: Fall Injury Risk  Goal: Absence of Fall and Fall-Related Injury  07/09/2020 0453 by Ancil Linsey, RN  Outcome: Ongoing - Unchanged  07/09/2020 0452 by Ancil Linsey, RN  Outcome: Ongoing - Unchanged     Problem: Wound  Goal: Optimal Wound Healing  07/09/2020 0453 by Ancil Linsey, RN  Outcome: Ongoing - Unchanged  07/09/2020 0452 by Ancil Linsey, RN  Outcome: Ongoing - Unchanged     Problem: Communication Impairment (Mechanical Ventilation, Invasive)  Goal: Effective Communication  07/09/2020 0453 by Ancil Linsey, RN  Outcome: Ongoing - Unchanged  07/09/2020 0452 by Ancil Linsey, RN  Outcome: Ongoing - Unchanged     Problem: Device-Related Complication Risk (Mechanical Ventilation, Invasive)  Goal: Optimal Device Function  07/09/2020 0453 by Ancil Linsey, RN  Outcome: Ongoing - Unchanged  07/09/2020 0452 by Ancil Linsey, RN  Outcome: Ongoing - Unchanged     Problem: Inability to Wean (Mechanical Ventilation, Invasive)  Goal: Mechanical Ventilation Liberation  07/09/2020 0453 by Ancil Linsey, RN  Outcome: Ongoing - Unchanged  07/09/2020 0452 by Ancil Linsey, RN  Outcome: Ongoing - Unchanged     Problem: Skin and Tissue Injury (Mechanical Ventilation, Invasive)  Goal: Absence of Device-Related Skin and Tissue Injury  07/09/2020 0453 by Ancil Linsey, RN  Outcome: Ongoing - Unchanged  07/09/2020 0452 by Ancil Linsey, RN  Outcome: Ongoing - Unchanged     Problem: Ventilator-Induced Lung Injury (Mechanical Ventilation, Invasive)  Goal: Absence of Ventilator-Induced Lung Injury  07/09/2020 0453 by Ancil Linsey, RN  Outcome: Ongoing - Unchanged  07/09/2020 0452 by Ancil Linsey, RN  Outcome: Ongoing - Unchanged     Problem: Self-Care Deficit  Goal: Improved Ability to Complete Activities of Daily Living  07/09/2020 0453 by Ancil Linsey, RN  Outcome: Ongoing - Unchanged  07/09/2020 0452 by Ancil Linsey, RN  Outcome: Ongoing - Unchanged     Problem: Asthma Comorbidity  Goal: Maintenance of Asthma Control  07/09/2020 0453 by Ancil Linsey, RN  Outcome: Ongoing - Unchanged  07/09/2020 0452 by Ancil Linsey, RN  Outcome: Ongoing - Unchanged     Problem: COPD Comorbidity  Goal: Maintenance of COPD Symptom Control  07/09/2020 0453 by Ancil Linsey, RN  Outcome: Ongoing - Unchanged  07/09/2020 0452 by Ancil Linsey, RN  Outcome: Ongoing - Unchanged     Problem: Diabetes Comorbidity  Goal: Blood Glucose Level Within Desired Range  07/09/2020 0453 by Ancil Linsey, RN  Outcome: Ongoing - Unchanged  07/09/2020 0452 by Ancil Linsey, RN  Outcome: Ongoing - Unchanged     Problem: Heart Failure Comorbidity  Goal: Maintenance of Heart Failure Symptom Control  07/09/2020 0453 by Ancil Linsey, RN  Outcome: Ongoing - Unchanged  07/09/2020 0452 by Ancil Linsey, RN  Outcome: Ongoing - Unchanged     Problem: Hypertension Comorbidity  Goal: Blood Pressure in Desired Range  07/09/2020 0453 by Ancil Linsey, RN  Outcome: Ongoing - Unchanged  07/09/2020 0452 by Ancil Linsey, RN  Outcome: Ongoing - Unchanged     Problem: Obstructive Sleep Apnea Risk or Actual (Comorbidity Management)  Goal: Unobstructed Breathing During Sleep  07/09/2020 0453 by Ancil Linsey, RN  Outcome: Ongoing - Unchanged  07/09/2020 0452 by Ancil Linsey, RN  Outcome: Ongoing - Unchanged     Problem: Pain Chronic (Persistent) (Comorbidity Management)  Goal: Acceptable Pain Control and Functional Ability  07/09/2020 0453 by Ancil Linsey, RN  Outcome: Ongoing - Unchanged  07/09/2020 0452 by Ancil Linsey, RN  Outcome: Ongoing - Unchanged     Problem: Seizure Disorder Comorbidity  Goal: Maintenance of Seizure Control  07/09/2020 0453 by Ancil Linsey, RN  Outcome: Ongoing - Unchanged  07/09/2020 0452 by Ancil Linsey, RN  Outcome: Ongoing - Unchanged     Problem: LTC COVID-19 Confirmed or Rule-Out  Goal: Patient/Resident will remain free of complications due to COVID-19  Description: 1. Review and update the patient/resident's isolation status in the Isolation activity  2. Keep the patient/resident's door closed at all times and limit movement of the patient/resident outside of the room to medically essential purposes. If applicable, transfer patient/resident to a single-person room   3. Educate and reinforce infection prevention and control practices recommended by CDC  4. Frequently monitor for development of more severe symptoms   5. Use appropriate PPE when providing care for patient/resident  6. Reinforce no visitor policy and non-essential health care personnel policy, except for certain compassionate care situations  7. If worsening of symptoms occur, alert the nearest Hospital caring for confirmed COVID-19 patients and arrange for transfer with proper precautions including placing a facemask on the patient/resident during transfer  8. Communicate information about known or suspected case of COVID-19 to appropriate public health personnel  9. Avoid procedures that are likely to induce coughing (e.g., sputum induction, open suctioning of airways). If required, do so in an Airborne Infection Isolation Room. The health care provider in the room should wear an N95 or higher-level respirator, eye protection, gloves, and a gown. The number of HCP present during  the procedure should be limited to only those essential for patient/resident care and procedure support. Visitors should not be present for the procedure. Clean and disinfect procedure room surfaces promptly  10. Update patient/resident and family/representatives as needed      07/09/2020 0453 by Ancil Linsey, RN  Outcome: Ongoing - Unchanged  07/09/2020 0452 by Ancil Linsey, RN  Outcome: Ongoing - Unchanged     Problem: Non-Violent Restraints  Goal: Patient will remain free of restraint events  Outcome: Resolved  Goal: Patient will remain free of physical injury  Outcome: Resolved

## 2020-07-09 NOTE — Unmapped (Signed)
MICU Daily Progress Note     Date of Service: 07/09/2020    Problem List:   Active Problems:    Liver transplant recipient (CMS-HCC)    Morbid obesity with BMI of 40.0-44.9, adult (CMS-HCC)    Acute hypoxemic respiratory failure due to severe acute respiratory syndrome coronavirus 2 (SARS-CoV-2) disease (CMS-HCC)  Resolved Problems:    * No resolved hospital problems. *      Interval history:  Evelyn Rollins is a 54 y.o. female with PMH cirrhosis status post liver transplant 2016 on chronic immunosuppressive therapy, hypertension, diabetes, hypothyroidism who presented to [facility] on 7/22 with myalgias, sore throat, loss of taste and fevers  x5 days. When her symptoms had started, she took a home Covid test on 7/17 which was found to be positive. She has had poor p.o. intake for the last several days. Her husband also has similar symptoms. In the OSH ED she was found to be severely hypoxic with O2 saturations of 50% on room air. She was placed on a nonrebreather and current saturations increased to the low 90s. Chest x-ray shows bilateral infiltrates consistent with COVID-19 pneumonia. Her Covid test is positive. Inflammatory markers are elevated. She received a dose of Decadron in the emergency room.    24hr events:   - vec x2 OVN for dyssynchrony and hi airway pressures  - net negative on 24 hours, no acute indication for HD  - supinate this morning    Neurological   Analgesia and Sedation  -dilaudid and versed gtt >> plan to wean dilaudid first as tol  - goal RASS 3- to 4-  -PRN dilaudid and versed  -ativan 8q4h, oxy 60q4h  ??  Anxiety, depression  -restart home meds Wellbutrin/Lexapro, and Seroquel in place of abilify  ??  Seizure history (not taking AEDs), ?seizure activity  -neurology consult >> c/f RUE weakness, MRI 7/27- no acute pathology  -vEEG negative for seizure x 48hrs >> discont monitoring per neuro 7/25  - MRI with possible sz focus, starting keppra 500 mg daily per neuro recommendations Pulmonary   #ARDS, Acute resp failure r/t COVID  - tolerating PRVC well  - cont lung protective ventilation strategies (66ml/kg, permissive hypercapnea)  - supinated today at 10am    Cardiovascular   h/o HTN   -holding home antihtn  - previously on levo for MAP >65  - last echo 2016 w nml LVEF, enlrgd RV and deprssed RV fxn w mild phtn  -continue statin     Renal   AKI  - oliguric >> fluid bolus, no improvement   - cont foley for accurate I/O during critical illness   - goal euvolemia   - replete electrolytes prn and monitor daily  - nephrology consult: lasix challenge with good response, made 2000cc urine    Infectious Disease/Autoimmune   #COVID 19 infection  Symptom onset:??7/17  Exposure: unknown  COVID PCR+: 7/17  OSH Admission:??7/22  Day of admission/transfer: 7/22  OSH Meds Given:??dexamethasone  COVID Specific??Meds:??dexamethasone 7/22, remdesivir 7/22  Vaccination status: not vaccinated  ??  Monitoring:  - Special airborne/contact precautions (If unavailable, droplet & contact precautions)  - f/u daily CMP, CBC w/ diff, CRP, D-dimer, DIC, troponin, pro-BNP with weekly ferritin, LDH and cytokine level  - f/u q6h lactate; ABG  ??  Medications:  - completed remdesivir  - cont decadron 6mg  po  daily x 10d, started 7/22  ??  #CAP/VAP: MSSA   - febrile, leukocytosis   - started cefep/vanc 7/25--> changed to  ancef on 7/27  - pan cx for T>38.2   - f/u cx data   - wbc count rising, monitor     1/2 staph epi bactermia likely contaminant:  - will repeat blood cultures neg  - f/u ID recs     Cultures:  Blood Culture, Routine (no units)   Date Value   07/06/2020 No Growth at 48 hours     Lower Respiratory Culture (no units)   Date Value   07/04/2020 3+ Methicillin-Susceptible Staphylococcus aureus (A)     WBC (10*9/L)   Date Value   07/09/2020 18.0 (H)     WBC, UA (/HPF)   Date Value   07/04/2020 3          FEN/GI   -trickle tube feeds >> intmt high residuals  - started reglan x 24hrs--residuals improving   -bowel regimen w senna and miralax  -PPI  ??     Malnutrition Assessment: Not done yet.  #Immunocompromised s/p Liver transplant in 2016  -continue holding tac  -HOLD cellcept    Heme/Coag   Hypercoagulable state 2/2 covid  -anticoagulation group B heparin gtt  -LE dopplers neg for DVT 7/24  -no evid bleeding    Endocrine   History of DM2  -insulin gtt stopped  -ISS  ??    Integumentary     #  - WOCN consulted for high risk skin assessment Yes.  - WOCN recs >> pending, ordered 7/25   - cont pressure mitigating precautions per skin policy    Prophylaxis/LDA/Restraints/Consults   Can CVC be removed? No: need for medications requiring central access (e.g. pressors)   Can A-line be removed? No: frequent ABGs  Can Foley be removed? No: Need continuous I/O  Mobility plan: Step 1 - Range of motion    Feeding: Trickle feeds, advance as tolerated  Analgesia: No pain issues  Sedation SAT/SBT: No PEEP > 8  Thromboembolic ppx: Enoxaparin  Head of bed >30 degrees: Yes  Ulcer ppx: Yes, coagulopathy  Glucose within target range: Yes, in range    Does patient need/have an active type/screen? NA    RASS at goal? Yes  [place] Agitation Assessment Scale (RASS) : -4 (07/09/2020 12:00 PM)     Can antipsychotics be stopped? N/A, not on antipsychotics  CAM-ICU Result: Positive Rich Reining) (07/08/2020  8:00 PM)      Would hospice care be appropriate for this patient? No, patient improving or expected to improve    Patient Lines/Drains/Airways Status    Active Active Lines, Drains, & Airways     Name:   Placement date:   Placement time:   Site:   Days:    ETT  7.5   2020-07-14    1700     6    CVC Triple Lumen 2020/07/14 Non-tunneled Right Internal jugular   14-Jul-2020    1945    Internal jugular   6    NG/OG Tube Decompression;Feedings Center mouth   07/03/20    0610    Center mouth   6    Urethral Catheter   07-14-20    ???    ???   7    Arterial Line 2020-07-14 Right Radial   Jul 14, 2020    2315    Radial   6              Patient Lines/Drains/Airways Status    Active Wounds None                Goals  of Care     Code Status: Full Code    Public relations account executive Maker:  Ms. Allmon current decisional capacity for healthcare decision-making is incapacitated. Her designated Educational psychologist) is/are her husband .      Subjective     Intubated, sedated    Objective     Vitals - past 24 hours  Temp:  [36.8 ??C (98.2 ??F)-37.2 ??C (98.9 ??F)] 37.1 ??C (98.8 ??F)  Heart Rate:  [66-84] 67  SpO2 Pulse:  [66-79] 67  Resp:  [11-34] 19  FiO2 (%):  [60 %-100 %] 100 %  SpO2:  [81 %-100 %] 97 % Intake/Output  I/O last 3 completed shifts:  In: 4391.5 [I.V.:1402.5; NG/GT:2770; IV Piggyback:219]  Out: 4085 [Urine:2785; Emesis/NG output:450; Stool:850]     ??  Physical Exam:   General: NAD, well nourished, obese, intubated, sedated   HEENT:  normocephalic, trachea mdl, anicteric, no scleral edema, no conjuctival erythema/drainage, MMM and pink   CV: RRR. S1 and S2 normal, no m/r/c/g. no bruit, or JVD. DP Pulses 2  equal. 1+ BLE edema.  Lungs:   chest rise symm, diminished bases. CTA bilat, no wheezes/crackles/rhonchi. Good air movement.  Skin:   w/d/i, no jaundice present, no rashes, lesions, petechiae or breakdown. no drng, erythema.    Abd: contour, abdomen soft, non-tender and not distended. Normoactive bowel sounds x 4 quads, no rebound tenderness or guarding.   Ext: No cyanosis, bruising, clubbing or edema.   Neuro: perrl 3Sl, mdl, orbital edema, +c/c/g,     Continuous Infusions:   ??? heparin     ??? HYDROmorphone 10 mg/hr (07/09/20 0740)   ??? midazolam (1 mg/mL) infusion 10 mg/hr (07/09/20 1252)   ??? norepinephrine bitartrate-NS 2 mcg/min (07/09/20 1245)   ??? propofol 10 mg/mL infusion 10 mcg/kg/min (07/09/20 9604)       Scheduled Medications:   ??? aspirin  81 mg Enteral tube: gastric  Daily   ??? buPROPion  100 mg Enteral tube: gastric  TID   ??? cefazolin  2 g Intravenous Q12H   ??? chlorhexidine  5 mL Mouth BID   ??? dexamethasone  6 mg Intravenous Q24H   ??? escitalopram oxalate  20 mg Oral Daily   ??? famotidine  20 mg Enteral tube: gastric  Daily   ??? heparin (porcine)  4,000 Units Intravenous Once   ??? insulin regular  0-12 Units Subcutaneous Q6H [facility]   ??? levETIRAcetam  500 mg Enteral tube: gastric  Daily   ??? levothyroxine  100 mcg Enteral tube: gastric  daily   ??? LORazepam  8 mg Enteral tube: gastric  Q4H   ??? oxyCODONE  60 mg Enteral tube: gastric  Q4H   ??? polyethylen glycol  17 g Enteral tube: gastric  TID   ??? pravastatin  40 mg Enteral tube: gastric  Daily   ??? QUEtiapine  50 mg Oral TID   ??? sennosides  10 mL Enteral tube: gastric  Nightly       PRN medications:  dextrose 50 % in water (D50W), dextrose 50 % in water (D50W), heparin (porcine), HYDROmorphone, midazolam    Data/Imaging Review: Reviewed in Epic and personally interpreted on 07/09/2020. See EMR for detailed results.      Critical Care Attestation     This patient is critically ill or injured with the impairment of vital organ systems such that there is a high probability of imminent or life threatening deterioration in the patient's condition. This patient must remain in the ICU  for ongoing evaluation of the comprehensive management plan outlined in this note. I directly provided critical care services as documented in this note and the critical care time spent (45 min) is exclusive of separately billable procedures.    Lanae Crumbly, NP

## 2020-07-09 NOTE — Unmapped (Signed)
Nephrology Follow up progress note          Assessment/Recommendations: Evelyn Rollins is a/an 54 y.o. female with a past medical history notable for AKI      # Non-Oliguric/Anuric AKI (unstable):   -Patient is nonoliguric.  He has a mixed acid-base picture.  There is an anion gap metabolic acidosis in the setting of acute kidney injury; there might be some underlying contraction alkalosis.  Would expect to CO2 to be lower, however patient might be having some underlying respiratory acidosis as well.  To prevent cardiac compromise in the setting of acidosis, we will start the patient on renal replacement therapy.  Nephrology will continue to follow  Likely secondary to   - it seems that patient has shock in the setting of COVID 19; the hypotension may have lead to ischemic damage of the renal system. Patient is making urine; we will recommend a lasix challenge for now. The patient does not seem to be overtly volume overloaded; electrolytes are within acceptable range.-Chart reviewed  -Continue to monitor daily Cr, Dose meds for GFR<15  -Monitor Daily I/Os, Daily weight   -Recommend sending Urine lytes , UOsm, POsm    -Will ideally obtain urine sample and analyze sediment for muddy brown casts, RBC casts, dysmorphic RBCs, WBC casts  --Maintain MAP>65 for optimal renal perfusion.               Evelyn Rollins  Division of Nephrology and Hypertension  [facility]  07/09/2020  4:37 PM      _____________________________________________________________________________________      History of Present Illness: Evelyn Rollins is a/an 54 y.o. female with a past medical hx significant for cirrhosis s/p liver transplant in 2016 on chronic immunosuppressive therapy, DM, htn who was admitted as a transfer from OSH due to acute hypoxic respiratory failure secondary to COVID19 pna. Patient was intubated and sedated during my encounter.      Medications:   Current Facility-Administered Medications   Medication Dose Route Frequency Provider Last Rate Last Admin   ??? heparin (porcine) 1,000 unit/mL 1000 unit/mL injection            ??? VECuronium (NORCURON) 10 mg injection            ??? aspirin chewable tablet 81 mg  81 mg Enteral tube: gastric  Daily [name], MD   81 mg at 07/09/20 5284   ??? buPROPion [facility]) tablet 100 mg  100 mg Enteral tube: gastric  TID Lanae Crumbly, NP   100 mg at 07/09/20 1331   ??? ceFAZolin (ANCEF) IVPB 1 g (premix)  1 g Intravenous Q12H Lanae Crumbly, NP       ??? chlorhexidine (PERIDEX) 0.12 % solution 5 mL  5 mL Mouth BID [name], MD   5 mL at 07/09/20 [phone]   ??? dexamethasone (DECADRON) 4 mg/mL injection 6 mg  6 mg Intravenous Q24H Lanae Crumbly, NP   6 mg at 07/08/20 1803   ??? dextrose 50 % in water (D50W) 50 % solution 12.5 g  12.5 g Intravenous Q10 Min PRN Lanae Crumbly, NP   12.5 g at 07/09/20 1144   ??? dextrose 50 % in water (D50W) 50 % solution 12.5 g  12.5 g Intravenous Q10 Min PRN Lanae Crumbly, NP       ??? escitalopram oxalate (LEXAPRO) tablet 20 mg  20 mg Oral Daily Lanae Crumbly, NP   20 mg at 07/09/20 4010   ???  famotidine (PEPCID) tablet 20 mg  20 mg Enteral tube: gastric  Daily [name], MD   20 mg at 07/09/20 1610   ??? heparin (porcine) 1000 unit/mL injection 2,000 Units  2,000 Units Intravenous Q6H PRN Lanae Crumbly, NP       ??? heparin 25,000 Units/250 mL (100 units/mL) in 0.45% saline infusion (premade)  12 Units/kg/hr Intravenous Continuous Lanae Crumbly, NP 12.96 mL/hr at 07/09/20 1405 12 Units/kg/hr at 07/09/20 1405   ??? HYDROmorphone (PF) (DILAUDID) injection 8 mg  8 mg Intravenous Q1H PRN Lanae Crumbly, NP   8 mg at 07/09/20 1628   ??? HYDROmorphone 1 mg/mL in 0.9% sodium chloride  0-10 mg/hr Intravenous Continuous [name], MD 10 mL/hr at 07/09/20 1243 10 mg/hr at 07/09/20 1243   ??? insulin regular (HumuLIN,NovoLIN) injection 0-12 Units  0-12 Units Subcutaneous Q6H [facility] Lanae Crumbly, NP   2 Units at 07/09/20 1434   ??? levETIRAcetam (KEPPRA) tablet 500 mg  500 mg Enteral tube: gastric  Daily Amy Ladon Applebaum, PA   500 mg at 07/09/20 0839   ??? levothyroxine (SYNTHROID) tablet 100 mcg  100 mcg Enteral tube: gastric  daily Lanae Crumbly, NP   100 mcg at 07/09/20 0611   ??? LORazepam (ATIVAN) tablet 8 mg  8 mg Enteral tube: gastric  Q4H Lanae Crumbly, NP   8 mg at 07/09/20 1617   ??? midazolam (VERSED) injection 10 mg  10 mg Intravenous Q1H PRN Lanae Crumbly, NP   10 mg at 07/09/20 1556   ??? midazolam in sodium chloride 0.9% (1 mg/mL) infusion PMB  0-10 mg/hr Intravenous Continuous Amy Ladon Applebaum, PA 10 mL/hr at 07/09/20 1252 10 mg/hr at 07/09/20 1252   ??? norepinephrine 8 mg in sodium chloride 0.9 % 250 mL (3mcg/mL) infusion PMB  0-30 mcg/min Intravenous Continuous Lanae Crumbly, NP 3.8 mL/hr at 07/09/20 1618 2 mcg/min at 07/09/20 1618   ??? NxStage RFP 400 (+/- BB) 5000 mL - contains 2 mEq/L of potassium dialysis solution 5,000 mL  5,000 mL CRRT Continuous Marland Mcalpine, MD       ??? NxStage RFP 401 (+/- BB) 5000 mL - contains 4 mEq/L of potassium dialysis solution 5,000 mL  5,000 mL CRRT Continuous Marland Mcalpine, MD       ??? oxyCODONE (ROXICODONE) immediate release tablet 60 mg  60 mg Enteral tube: gastric  Q4H Lanae Crumbly, NP   60 mg at 07/09/20 1330   ??? polyethylene glycol (MIRALAX) packet 17 g  17 g Enteral tube: gastric  TID [name], MD   17 g at 07/09/20 1330   ??? pravastatin (PRAVACHOL) tablet 40 mg  40 mg Enteral tube: gastric  Daily [name], MD   40 mg at 07/09/20 9604   ??? propofol (DIPRIVAN) infusion 10 mg/mL  0-50 mcg/kg/min Intravenous Continuous Lanae Crumbly, NP 6.5 mL/hr at 07/09/20 0928 10 mcg/kg/min at 07/09/20 5409   ??? QUEtiapine (SEROquel) tablet 50 mg  50 mg Oral TID Lanae Crumbly, NP   50 mg at 07/09/20 1331   ??? sennosides (SENOKOT) oral syrup  10 mL Enteral tube: gastric  Nightly [name], MD       ??? VECuronium (NORCURON) injection 10 mg  10 mg Intravenous Once Lanae Crumbly, NP            ALLERGIES  Patient has no known allergies.    MEDICAL HISTORY  Past  Medical History:   Diagnosis Date   ??? Cirrhosis (CMS-HCC)    ??? Hypothyroidism    ??? NAFLD (nonalcoholic fatty liver disease)    ??? Obesity    ??? Seizure (CMS-HCC) Dec 2015        SOCIAL HISTORY  Social History     Socioeconomic History   ??? Marital status: Married     Spouse name: Not on file   ??? Number of children: Not on file   ??? Years of education: Not on file   ??? Highest education level: Not on file   Occupational History   ??? Not on file   Tobacco Use   ??? Smoking status: Never Smoker   ??? Smokeless tobacco: Never Used   Substance and Sexual Activity   ??? Alcohol use: No     Alcohol/week: 0.0 standard drinks   ??? Drug use: No   ??? Sexual activity: Not on file   Other Topics Concern   ??? Not on file   Social History Narrative    Living situation: the patient lives with her husband.    Address [place], [place], [place]): [place], [place], [place]    Guardian/Payee: None        Family Contact: none provided    Outpatient Providers: no psychiatric providers    Relationship Status: Married     Children: Yes; two children and one stepchild    Education: HS graduate    Income/Employment/Disability: Doesn't currently work. 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 Informant: the patient     Domestic Violence: None known. Informant: the patient     Exposure/Witness to Violence: None known    Protective Services Involvement: None known    Current/Prior Legal: None known    Physical Aggression/Violence: None known    Access to Firearms: Yes, Unsecured     Gang Involvement: None known        Psychiatric History:    Diagnoses:    None        Inpatient hospitalizations:    None        Outpatient treatment:    None        Medication trials:    None per patient. Per medical chart, she received Valium for a brief period of time for some anxiety.        Suicide attempts/Self-injury:    None        Substance Use:    None        Family Psychiatric History:    None                 Social Determinants of Health     Financial Resource Strain: Low Risk    ??? Difficulty of Paying Living Expenses: Not very hard   Food Insecurity: No Food Insecurity   ??? Worried About Running Out of Food in the Last Year: Never true   ??? Ran Out of Food in the Last Year: Never true   Transportation Needs: No Transportation Needs   ??? Lack of Transportation (Medical): No   ??? Lack of Transportation (Non-Medical): No   Physical Activity:    ??? Days of Exercise per Week:    ??? Minutes of Exercise per Session:    Stress:    ??? Feeling of Stress :    Social Connections:    ??? Frequency of Communication with Friends and Family:    ???  Frequency of Social Gatherings with Friends and Family:    ??? Attends Religious Services:    ??? Database administrator or Organizations:    ??? Attends Engineer, structural:    ??? Marital Status:         FAMILY HISTORY  Family History   Problem Relation Age of Onset   ??? Breast cancer Mother    ??? Thyroid cancer Mother    ??? Ovarian cancer Mother    ??? Cancer Mother    ??? COPD Father    ??? Diabetes Father    ??? Melanoma Father    ??? Other Father         pacemaker            Review of Systems:  Review of systems was unobtainable due to patient's altered mental status  Otherwise as per HPI, all other systems reviewed and negative    Physical Exam:  Vitals:    07/09/20 1500   BP:    Pulse: 74   Resp: 23   Temp:    SpO2: 91%     I/O this shift:  In: 464.1 [I.V.:204.1; NG/GT:210; IV Piggyback:50]  Out: 450 [Urine:450]    Intake/Output Summary (Last 24 hours) at 07/09/2020 1637  Last data filed at 07/09/2020 1400  Gross per 24 hour   Intake 1754.13 ml   Output 2865 ml   Net -1110.87 ml     General: well-appearing, no acute distress  HEENT: anicteric sclera, oropharynx clear without lesions  CV: regular rate, normal rhythm, no murmurs, no gallops, no rubs  Lungs: no crackles appreciated  Abd: soft, non-tender, non-distended  Skin: no visible lesions or rashes  Musculoskeletal: +1 pitting edema      Test Results  Reviewed  Lab Results   Component Value Date    NA 144 07/09/2020    K 5.0 (H) 07/09/2020    CL 110 (H) 07/09/2020    CO2 20.0 07/09/2020    BUN 147 (H) 07/09/2020    CREATININE 4.81 (H) 07/09/2020    GLU 175 07/09/2020    CALCIUM 8.1 (L) 07/09/2020    ALBUMIN 1.7 (L) 07/09/2020    PHOS 6.0 (H) 07/09/2020         I have reviewed all relevant outside healthcare records related to the patient's kidney injury.

## 2020-07-09 NOTE — Unmapped (Signed)
Nephrology Treatment Plan    Nephrology have been following for nonoliguric acute kidney injury.  Ins and outs and labs have been reviewed.  Creatinine level continues to elevate very slowly; electrolytes are within acceptable range.  Acid-base picture is stable.  Patient has been having good urine output.  May continue Lasix challenge if needed.  We will hold off on dialysis for now.    Marland Mcalpine MBBS  PGY-4 Neprhology Fellow  [facility]

## 2020-07-09 NOTE — Unmapped (Signed)
Tacrolimus Therapeutic Monitoring Pharmacy Note    Evelyn Rollins is a 54 y.o. female continuing tacrolimus.     Indication: Liver transplant     Date of Transplant: 2016      Prior Dosing Information: Home regimen  tacrolimus 2mg  am and 1mg  pm   Reduced to 1mg  + 0.5mg  on 7/27 and held beginning 7/28 pm    Goals:  Therapeutic Drug Levels  Tacrolimus trough goal: 3-5 ng/mL per Consult note 07/03/20    Additional Clinical Monitoring/Outcomes  ?? Monitor renal function (SCr and urine output) and liver function (LFTs)  ?? Monitor for signs/symptoms of adverse events (e.g., hyperglycemia, hyperkalemia, hypomagnesemia, hypertension, headache, tremor)    Results:   Tacrolimus level: 10.7 ng/m with am labs    Pharmacokinetic Considerations and Significant Drug Interactions:  ??? Concurrent hepatotoxic medications: none identified at time of note  ??? Concurrent CYP3A4 substrates/inhibitors: none identified at time of note  ??? Concurrent nephrotoxic medications: none identified at time of note    Assessment/Plan:  UNLESS INDICATED OTHERWISE BY TRANSPLANT TEAM    Recommendation(s)  ??? HOLD tac until levels downtrend   ??? Will continue to watch levels daily\    Follow-up  ??? Next level has been ordered on 07/10/20 at 0400.   ??? A pharmacist will continue to monitor and recommend levels as appropriate    Longitudinal Dose Monitoring:  Date Dose (mg), Route AM Scr (mg/dL) Level  (ng/mL) Key Drug Interactions   07/29 holding 4.73 10.7 ( 0340) Holding   07/28 1 + HOLD 4.11 9.2 (0744) HOLDING   07/27 2 + 0.5 NG 3.27 9.5 (0840) Reduce to 1mg  / 0.5mg    07/26 2+1 NG 2.50 5.8 (0650)    07/25 2+1 NG 1.38 3.5 (2956)    07/24 2+1 NG 1.52 2.3 (2130)    07/23 1mg  NG 1.3 1.8 (8657)         Please page service pharmacist with questions/clarifications.    Beverely Risen, PharmD

## 2020-07-10 LAB — BLOOD GAS CRITICAL CARE PANEL, ARTERIAL
BASE EXCESS ARTERIAL: -6.4 — ABNORMAL LOW (ref -2.0–2.0)
BASE EXCESS ARTERIAL: -1.6 (ref -2.0–2.0)
CALCIUM IONIZED ARTERIAL (MG/DL): 4.41 mg/dL (ref 4.40–5.40)
CALCIUM IONIZED ARTERIAL (MG/DL): 4.44 mg/dL (ref 4.40–5.40)
CALCIUM IONIZED ARTERIAL (MG/DL): 4.98 mg/dL (ref 4.40–5.40)
CALCIUM IONIZED ARTERIAL (MG/DL): 4.94 mg/dL (ref 4.40–5.40)
GLUCOSE WHOLE BLOOD: 198 mg/dL — ABNORMAL HIGH (ref 70–179)
GLUCOSE WHOLE BLOOD: 158 mg/dL (ref 70–179)
GLUCOSE WHOLE BLOOD: 183 mg/dL — ABNORMAL HIGH (ref 70–179)
HCO3 ARTERIAL: 19 mmol/L — ABNORMAL LOW (ref 22–27)
HCO3 ARTERIAL: 19 mmol/L — ABNORMAL LOW (ref 22–27)
HCO3 ARTERIAL: 25 mmol/L (ref 22–27)
HCO3 ARTERIAL: 25 mmol/L (ref 22–27)
HEMOGLOBIN BLOOD GAS: 9.6 g/dL — ABNORMAL LOW (ref 12.00–16.00)
HEMOGLOBIN BLOOD GAS: 10 g/dL — ABNORMAL LOW (ref 12.00–16.00)
LACTATE BLOOD ARTERIAL: 1.5 mmol/L — ABNORMAL HIGH (ref <1.3)
LACTATE BLOOD ARTERIAL: 1.8 mmol/L — ABNORMAL HIGH (ref <1.3)
O2 SATURATION ARTERIAL: 91.6 % — ABNORMAL LOW (ref 94.0–100.0)
O2 SATURATION ARTERIAL: 95.1 % (ref 94.0–100.0)
O2 SATURATION ARTERIAL: 96.4 % (ref 94.0–100.0)
PCO2 ARTERIAL: 50.4 mmHg — ABNORMAL HIGH (ref 35.0–45.0)
PCO2 ARTERIAL: 57.5 mmHg — ABNORMAL HIGH (ref 35.0–45.0)
PH ARTERIAL: 7.31 — ABNORMAL LOW (ref 7.35–7.45)
PH ARTERIAL: 7.29 — ABNORMAL LOW (ref 7.35–7.45)
PH ARTERIAL: 7.31 — ABNORMAL LOW (ref 7.35–7.45)
PH ARTERIAL: 7.25 — ABNORMAL LOW (ref 7.35–7.45)
PO2 ARTERIAL: 108 mmHg (ref 80.0–110.0)
PO2 ARTERIAL: 70.4 mmHg — ABNORMAL LOW (ref 80.0–110.0)
PO2 ARTERIAL: 80.5 mmHg (ref 80.0–110.0)
PO2 ARTERIAL: 96.4 mmHg (ref 80.0–110.0)
POTASSIUM WHOLE BLOOD: 4 mmol/L (ref 3.4–4.6)
POTASSIUM WHOLE BLOOD: 4 mmol/L (ref 3.4–4.6)
POTASSIUM WHOLE BLOOD: 4.6 mmol/L (ref 3.4–4.6)
POTASSIUM WHOLE BLOOD: 4.7 mmol/L — ABNORMAL HIGH (ref 3.4–4.6)
SODIUM WHOLE BLOOD: 143 mmol/L (ref 135–145)
SODIUM WHOLE BLOOD: 143 mmol/L (ref 135–145)
SODIUM WHOLE BLOOD: 140 mmol/L (ref 135–145)
SODIUM WHOLE BLOOD: 140 mmol/L (ref 135–145)

## 2020-07-10 LAB — HEPARIN CORRELATION: Lab: <0.2

## 2020-07-10 LAB — COMPREHENSIVE METABOLIC PANEL
ALBUMIN: 1.9 g/dL — ABNORMAL LOW (ref 3.4–5.0)
ALBUMIN: 1.7 g/dL — ABNORMAL LOW (ref 3.4–5.0)
ALKALINE PHOSPHATASE: 58 U/L (ref 46–116)
ALKALINE PHOSPHATASE: 54 U/L (ref 46–116)
ALKALINE PHOSPHATASE: 56 U/L (ref 46–116)
ALT (SGPT): 13 U/L (ref 10–49)
ALT (SGPT): 15 U/L (ref 10–49)
ANION GAP: 11 mmol/L (ref 5–14)
ANION GAP: 9 mmol/L (ref 5–14)
ANION GAP: 7 mmol/L (ref 5–14)
AST (SGOT): 31 U/L (ref <=34)
AST (SGOT): 39 U/L — ABNORMAL HIGH (ref <=34)
AST (SGOT): 42 U/L — ABNORMAL HIGH (ref <=34)
BILIRUBIN TOTAL: 0.3 mg/dL (ref 0.3–1.2)
BILIRUBIN TOTAL: 0.2 mg/dL — ABNORMAL LOW (ref 0.3–1.2)
BILIRUBIN TOTAL: 0.3 mg/dL (ref 0.3–1.2)
BLOOD UREA NITROGEN: 64 mg/dL — ABNORMAL HIGH (ref 9–23)
BUN / CREAT RATIO: 30
BUN / CREAT RATIO: 27
CALCIUM: 8.3 mg/dL — ABNORMAL LOW (ref 8.7–10.4)
CALCIUM: 8.3 mg/dL — ABNORMAL LOW (ref 8.7–10.4)
CALCIUM: 8.1 mg/dL — ABNORMAL LOW (ref 8.7–10.4)
CHLORIDE: 109 mmol/L — ABNORMAL HIGH (ref 98–107)
CHLORIDE: 108 mmol/L — ABNORMAL HIGH (ref 98–107)
CO2: 20 mmol/L (ref 20.0–31.0)
CO2: 21 mmol/L (ref 20.0–31.0)
CO2: 25 mmol/L (ref 20.0–31.0)
CREATININE: 2.82 mg/dL — ABNORMAL HIGH
CREATININE: 2.22 mg/dL — ABNORMAL HIGH
CREATININE: 1.9 mg/dL — ABNORMAL HIGH
EGFR CKD-EPI AA FEMALE: 21 mL/min/{1.73_m2} — ABNORMAL LOW (ref >=60)
EGFR CKD-EPI AA FEMALE: 28 mL/min/{1.73_m2} — ABNORMAL LOW (ref >=60)
EGFR CKD-EPI AA FEMALE: 34 mL/min/{1.73_m2} — ABNORMAL LOW (ref >=60)
EGFR CKD-EPI NON-AA FEMALE: 18 mL/min/{1.73_m2} — ABNORMAL LOW (ref >=60)
EGFR CKD-EPI NON-AA FEMALE: 24 mL/min/{1.73_m2} — ABNORMAL LOW (ref >=60)
EGFR CKD-EPI NON-AA FEMALE: 29 mL/min/{1.73_m2} — ABNORMAL LOW (ref >=60)
GLUCOSE RANDOM: 239 mg/dL — ABNORMAL HIGH (ref 70–179)
GLUCOSE RANDOM: 184 mg/dL — ABNORMAL HIGH (ref 70–179)
GLUCOSE RANDOM: 192 mg/dL — ABNORMAL HIGH (ref 70–179)
POTASSIUM: 4.8 mmol/L — ABNORMAL HIGH (ref 3.4–4.5)
POTASSIUM: 4.8 mmol/L — ABNORMAL HIGH (ref 3.4–4.5)
POTASSIUM: 4.6 mmol/L — ABNORMAL HIGH (ref 3.4–4.5)
PROTEIN TOTAL: 5.9 g/dL (ref 5.7–8.2)
PROTEIN TOTAL: 5.7 g/dL (ref 5.7–8.2)
PROTEIN TOTAL: 5.4 g/dL — ABNORMAL LOW (ref 5.7–8.2)
SODIUM: 140 mmol/L (ref 135–145)
SODIUM: 138 mmol/L (ref 135–145)
SODIUM: 136 mmol/L (ref 135–145)

## 2020-07-10 LAB — CBC
HEMATOCRIT: 33.7 % — ABNORMAL LOW (ref 36.0–46.0)
HEMOGLOBIN: 11.2 g/dL — ABNORMAL LOW (ref 12.0–16.0)
HEMOGLOBIN: 10.8 g/dL — ABNORMAL LOW (ref 12.0–16.0)
MEAN CORPUSCULAR HEMOGLOBIN CONC: 31.5 g/dL (ref 31.0–37.0)
MEAN CORPUSCULAR HEMOGLOBIN CONC: 32 g/dL (ref 31.0–37.0)
MEAN CORPUSCULAR HEMOGLOBIN: 29 pg (ref 26.0–34.0)
MEAN CORPUSCULAR HEMOGLOBIN: 29.3 pg (ref 26.0–34.0)
MEAN CORPUSCULAR VOLUME: 91.5 fL (ref 80.0–100.0)
MEAN PLATELET VOLUME: 8.4 fL (ref 7.0–10.0)
PLATELET COUNT: 295 10*9/L (ref 150–440)
PLATELET COUNT: 256 10*9/L (ref 150–440)
RED BLOOD CELL COUNT: 3.87 10*12/L — ABNORMAL LOW (ref 4.00–5.20)
RED CELL DISTRIBUTION WIDTH: 14.7 % (ref 12.0–15.0)
RED CELL DISTRIBUTION WIDTH: 14.8 % (ref 12.0–15.0)
WBC ADJUSTED: 8.8 10*9/L (ref 4.5–11.0)
WBC ADJUSTED: 10.6 10*9/L (ref 4.5–11.0)

## 2020-07-10 LAB — D-DIMER QUANTITATIVE (CW,ML,HL): Lab: 1177 — ABNORMAL HIGH

## 2020-07-10 LAB — PO2 ARTERIAL: Oxygen:PPres:Pt:BldA:Qn:: 103

## 2020-07-10 LAB — PCO2 ARTERIAL: Carbon dioxide:PPres:Pt:BldA:Qn:: 40.7

## 2020-07-10 LAB — TACROLIMUS, TROUGH: Lab: 5.9

## 2020-07-10 LAB — PLATELET COUNT: Platelets:NCnc:Pt:Bld:Qn:Automated count: 295

## 2020-07-10 LAB — BLOOD GAS, ARTERIAL
FIO2 ARTERIAL: 60
O2 SATURATION ARTERIAL: 97.4 % (ref 94.0–100.0)
PCO2 ARTERIAL: 38.5 mmHg (ref 35.0–45.0)
PH ARTERIAL: 7.31 — ABNORMAL LOW (ref 7.35–7.45)
PO2 ARTERIAL: 103 mmHg (ref 80.0–110.0)

## 2020-07-10 LAB — BUN / CREAT RATIO: Urea nitrogen/Creatinine:MRto:Pt:Ser/Plas:Qn:: 27

## 2020-07-10 LAB — APTT
APTT: 25 s (ref 24.9–36.9)
Coagulation surface induced:Time:Pt:PPP:Qn:Coag: 44.9 — ABNORMAL HIGH
Coagulation surface induced:Time:Pt:PPP:Qn:Coag: 98.8 — ABNORMAL HIGH
HEPARIN CORRELATION: 0.6

## 2020-07-10 LAB — MAGNESIUM
Magnesium:MCnc:Pt:Ser/Plas:Qn:: 1.9
Magnesium:MCnc:Pt:Ser/Plas:Qn:: 2.2
Magnesium:MCnc:Pt:Ser/Plas:Qn:: 2.3
Magnesium:MCnc:Pt:Ser/Plas:Qn:: 2.4

## 2020-07-10 LAB — GLUCOSE RANDOM
Glucose:MCnc:Pt:Ser/Plas:Qn:: 192 — ABNORMAL HIGH
Glucose:MCnc:Pt:Ser/Plas:Qn:: 239 — ABNORMAL HIGH

## 2020-07-10 LAB — MEAN CORPUSCULAR HEMOGLOBIN: Erythrocyte mean corpuscular hemoglobin:EntMass:Pt:RBC:Qn:Automated count: 29.3

## 2020-07-10 LAB — PHOSPHORUS
Phosphate:MCnc:Pt:Ser/Plas:Qn:: 4.8
Phosphate:MCnc:Pt:Ser/Plas:Qn:: 5.3 — ABNORMAL HIGH
Phosphate:MCnc:Pt:Ser/Plas:Qn:: 6.1 — ABNORMAL HIGH

## 2020-07-10 LAB — PH ARTERIAL: pH:LsCnc:Pt:BldA:Qn:: 7.31 — ABNORMAL LOW

## 2020-07-10 LAB — SPECIMEN SOURCE

## 2020-07-10 LAB — GLUCOSE WHOLE BLOOD: Glucose:MCnc:Pt:Bld:Qn:: 198 — ABNORMAL HIGH

## 2020-07-10 MED ADMIN — ceFAZolin (ANCEF) IVPB 2 g in 50 ml dextrose (premix): 2 g | INTRAVENOUS | @ 01:00:00 | Stop: 2020-07-12

## 2020-07-10 MED ADMIN — LORazepam (ATIVAN) tablet 8 mg: 8 mg | GASTROENTERAL | @ 09:00:00

## 2020-07-10 MED ADMIN — HYDROmorphone (PF) (DILAUDID) injection 8 mg: 8 mg | INTRAVENOUS | @ 16:00:00

## 2020-07-10 MED ADMIN — sennosides (SENOKOT) oral syrup: 10 mL | GASTROENTERAL | @ 01:00:00

## 2020-07-10 MED ADMIN — midazolam in sodium chloride 0.9% (1 mg/mL) infusion PMB: 0-12 mg/h | INTRAVENOUS | @ 08:00:00

## 2020-07-10 MED ADMIN — VECuronium (NORCURON) 10 mg injection: Stop: 2020-07-10

## 2020-07-10 MED ADMIN — QUEtiapine (SEROquel) tablet 50 mg: 50 mg | ORAL | @ 13:00:00 | Stop: 2020-07-10

## 2020-07-10 MED ADMIN — VECuronium (NORCURON) injection 10 mg: 10 mg | INTRAVENOUS | @ 17:00:00 | Stop: 2020-07-10

## 2020-07-10 MED ADMIN — NxStage RFP 401 (+/- BB) 5000 mL - contains 4 mEq/L of potassium dialysis solution 5,000 mL: 5000 mL | INTRAVENOUS_CENTRAL | @ 09:00:00

## 2020-07-10 MED ADMIN — QUEtiapine (SEROquel) tablet 50 mg: 50 mg | GASTROENTERAL | @ 17:00:00

## 2020-07-10 MED ADMIN — midazolam (VERSED) injection 10 mg: 10 mg | INTRAVENOUS | @ 08:00:00

## 2020-07-10 MED ADMIN — famotidine (PEPCID) tablet 20 mg: 20 mg | GASTROENTERAL | @ 13:00:00

## 2020-07-10 MED ADMIN — buPROPion (WELLBUTRIN) tablet 100 mg: 100 mg | GASTROENTERAL | @ 13:00:00

## 2020-07-10 MED ADMIN — NxStage RFP 401 (+/- BB) 5000 mL - contains 4 mEq/L of potassium dialysis solution 5,000 mL: 5000 mL | INTRAVENOUS_CENTRAL | @ 20:00:00

## 2020-07-10 MED ADMIN — NxStage RFP 401 (+/- BB) 5000 mL - contains 4 mEq/L of potassium dialysis solution 5,000 mL: 5000 mL | INTRAVENOUS_CENTRAL | @ 14:00:00

## 2020-07-10 MED ADMIN — HYDROmorphone (PF) (DILAUDID) injection 8 mg: 8 mg | INTRAVENOUS | @ 13:00:00

## 2020-07-10 MED ADMIN — midazolam (VERSED) injection 10 mg: 10 mg | INTRAVENOUS

## 2020-07-10 MED ADMIN — oxyCODONE (ROXICODONE) immediate release tablet 60 mg: 60 mg | GASTROENTERAL | @ 01:00:00 | Stop: 2020-07-17

## 2020-07-10 MED ADMIN — oxyCODONE (ROXICODONE) immediate release tablet 60 mg: 60 mg | GASTROENTERAL | @ 22:00:00 | Stop: 2020-07-17

## 2020-07-10 MED ADMIN — LORazepam (ATIVAN) tablet 8 mg: 8 mg | GASTROENTERAL | @ 20:00:00

## 2020-07-10 MED ADMIN — LORazepam (ATIVAN) tablet 8 mg: 8 mg | GASTROENTERAL | @ 16:00:00

## 2020-07-10 MED ADMIN — buPROPion (WELLBUTRIN) tablet 100 mg: 100 mg | GASTROENTERAL

## 2020-07-10 MED ADMIN — polyethylene glycol (MIRALAX) packet 17 g: 17 g | GASTROENTERAL | @ 17:00:00

## 2020-07-10 MED ADMIN — oxyCODONE (ROXICODONE) immediate release tablet 60 mg: 60 mg | GASTROENTERAL | @ 19:00:00 | Stop: 2020-07-17

## 2020-07-10 MED ADMIN — insulin regular (HumuLIN,NovoLIN) injection 0-12 Units: 0-12 [IU] | SUBCUTANEOUS | @ 16:00:00

## 2020-07-10 MED ADMIN — LORazepam (ATIVAN) tablet 8 mg: 8 mg | GASTROENTERAL | @ 13:00:00

## 2020-07-10 MED ADMIN — HYDROmorphone 1 mg/mL in 0.9% sodium chloride: 0-12 mg/h | INTRAVENOUS | @ 18:00:00 | Stop: 2020-07-16

## 2020-07-10 MED ADMIN — midazolam (VERSED) injection 10 mg: 10 mg | INTRAVENOUS | @ 15:00:00

## 2020-07-10 MED ADMIN — polyethylene glycol (MIRALAX) packet 17 g: 17 g | GASTROENTERAL | @ 13:00:00

## 2020-07-10 MED ADMIN — aspirin chewable tablet 81 mg: 81 mg | GASTROENTERAL | @ 13:00:00

## 2020-07-10 MED ADMIN — midazolam (VERSED) injection 10 mg: 10 mg | INTRAVENOUS | @ 20:00:00

## 2020-07-10 MED ADMIN — midazolam in sodium chloride 0.9% (1 mg/mL) infusion PMB: 0-12 mg/h | INTRAVENOUS | @ 21:00:00

## 2020-07-10 MED ADMIN — HYDROmorphone 1 mg/mL in 0.9% sodium chloride: 0-12 mg/h | INTRAVENOUS | @ 22:00:00 | Stop: 2020-07-16

## 2020-07-10 MED ADMIN — midazolam in sodium chloride 0.9% (1 mg/mL) infusion PMB: 0-10 mg/h | INTRAVENOUS | @ 01:00:00

## 2020-07-10 MED ADMIN — LORazepam (ATIVAN) tablet 8 mg: 8 mg | GASTROENTERAL

## 2020-07-10 MED ADMIN — oxyCODONE (ROXICODONE) immediate release tablet 60 mg: 60 mg | GASTROENTERAL | @ 06:00:00 | Stop: 2020-07-17

## 2020-07-10 MED ADMIN — HYDROmorphone (PF) (DILAUDID) injection 8 mg: 8 mg | INTRAVENOUS

## 2020-07-10 MED ADMIN — chlorhexidine (PERIDEX) 0.12 % solution 5 mL: 5 mL | OROMUCOSAL | @ 12:00:00

## 2020-07-10 MED ADMIN — buPROPion (WELLBUTRIN) tablet 100 mg: 100 mg | GASTROENTERAL | @ 17:00:00

## 2020-07-10 MED ADMIN — HYDROmorphone 1 mg/mL in 0.9% sodium chloride: 0-10 mg/h | INTRAVENOUS | @ 03:00:00 | Stop: 2020-07-16

## 2020-07-10 MED ADMIN — oxyCODONE (ROXICODONE) immediate release tablet 60 mg: 60 mg | GASTROENTERAL | @ 13:00:00 | Stop: 2020-07-17

## 2020-07-10 MED ADMIN — polyethylene glycol (MIRALAX) packet 17 g: 17 g | GASTROENTERAL

## 2020-07-10 MED ADMIN — LORazepam (ATIVAN) tablet 8 mg: 8 mg | GASTROENTERAL | @ 04:00:00

## 2020-07-10 MED ADMIN — midazolam (VERSED) injection 10 mg: 10 mg | INTRAVENOUS | @ 17:00:00

## 2020-07-10 MED ADMIN — HYDROmorphone (PF) (DILAUDID) injection 8 mg: 8 mg | INTRAVENOUS | @ 08:00:00

## 2020-07-10 NOTE — Unmapped (Signed)
MICU Daily Progress Note     Date of Service: 07/10/2020    Problem List:   Active Problems:    Liver transplant recipient (CMS-HCC)    Morbid obesity with BMI of 40.0-44.9, adult (CMS-HCC)    Acute hypoxemic respiratory failure due to severe acute respiratory syndrome coronavirus 2 (SARS-CoV-2) disease (CMS-HCC)  Resolved Problems:    * No resolved hospital problems. *      Interval history:  Evelyn Rollins is a 54 y.o. female with PMH cirrhosis status post liver transplant 2016 on chronic immunosuppressive therapy, hypertension, diabetes, hypothyroidism who presented to [facility] on 7/22 with myalgias, sore throat, loss of taste and fevers  x5 days. When her symptoms had started, she took a home Covid test on 7/17 which was found to be positive. She has had poor p.o. intake for the last several days. Her husband also has similar symptoms. In the OSH ED she was found to be severely hypoxic with O2 saturations of 50% on room air. She was placed on a nonrebreather and current saturations increased to the low 90s. Chest x-ray shows bilateral infiltrates consistent with COVID-19 pneumonia. Her Covid test is positive. Inflammatory markers are elevated. She received a dose of Decadron in the emergency room.    24hr events:   -remained prone overnight  -CRRT started, UF @ 10    Neurological   Analgesia and Sedation  -dilaudid and versed gtt >> plan to wean dilaudid first as tol  -goal RASS 3- to 4-  -PRN dilaudid and versed  -ativan 8q4h, oxy 60q4h  ??  Anxiety, depression  -restarted home meds Wellbutrin/Lexapro, and Seroquel in place of abilify  ??  Seizure history (not taking AEDs), ?seizure activity  -neurology consult >> c/f RUE weakness, MRI 7/27- no acute pathology  -vEEG negative for seizure x 48hrs >> discont monitoring per neuro 7/25  - MRI with possible sz focus, starting keppra 500 mg daily per neuro recommendations       Pulmonary   #ARDS, Acute resp failure r/t COVID  - tolerating PRVC well  - cont lung protective ventilation strategies (5ml/kg, permissive hypercapnea)  - supinated today at 10am    Cardiovascular   h/o HTN   - holding home antihtn  - previously on levo for MAP >65  - last echo 2016 w nml LVEF, enlrgd RV and deprssed RV fxn w mild phtn  -continue statin     Renal   AKI  - cont foley for accurate I/O during critical illness   - goal euvolemia   - replete electrolytes prn and monitor daily  - nephrology consult  - CRRT started overnight    Infectious Disease/Autoimmune   #COVID 19 infection  Symptom onset:??7/17  Exposure: unknown  COVID PCR+: 7/17  OSH Admission:??7/22  Day of admission/transfer: 7/22  OSH Meds Given:??dexamethasone  COVID Specific??Meds:??dexamethasone 7/22, remdesivir 7/22  Vaccination status: not vaccinated  ??  Monitoring:  - Special airborne/contact precautions (If unavailable, droplet & contact precautions)  - f/u daily CMP, CBC w/ diff, CRP, D-dimer, DIC, troponin, pro-BNP with weekly ferritin, LDH and cytokine level  - f/u q6h lactate; ABG  ??  Medications:  - completed remdesivir  - cont decadron 6mg  po  daily x 10d, started 7/22  ??  #CAP/VAP: MSSA   - febrile, leukocytosis   - started cefep/vanc 7/25--> changed to ancef on 7/27  - pan cx for T>38.2   - f/u cx data   - wbc count rising, monitor  1/2 staph epi bactermia likely contaminant:  - will repeat blood cultures neg  - f/u ID recs     Cultures:  Blood Culture, Routine (no units)   Date Value   07/06/2020 No Growth at 72 hours     Lower Respiratory Culture (no units)   Date Value   07/04/2020 3+ Methicillin-Susceptible Staphylococcus aureus (A)     WBC (10*9/L)   Date Value   07/09/2020 18.0 (H)     WBC, UA (/HPF)   Date Value   07/04/2020 3          FEN/GI   -trickle tube feeds > advance to goal  -s/p reglan x 24hrs--residuals improving   -bowel regimen w senna and miralax  -PPI  ??     Malnutrition Assessment: Not done yet.  #Immunocompromised s/p Liver transplant in 2016  -continue holding tac  -HOLD cellcept    Heme/Coag Hypercoagulable state 2/2 covid  -anticoagulation group C heparin  -LE dopplers neg for DVT 7/24  -no evid bleeding    Endocrine   History of DM2  -insulin gtt stopped  -ISS  ??    Integumentary     #  - WOCN consulted for high risk skin assessment Yes.  - WOCN recs >> pending, ordered 7/25   - cont pressure mitigating precautions per skin policy    Prophylaxis/LDA/Restraints/Consults   Can CVC be removed? No: need for medications requiring central access (e.g. pressors)   Can A-line be removed? No: frequent ABGs  Can Foley be removed? No: Need continuous I/O  Mobility plan: Step 1 - Range of motion    Feeding: Trickle feeds, advance as tolerated  Analgesia: No pain issues  Sedation SAT/SBT: No PEEP > 8  Thromboembolic ppx: Enoxaparin  Head of bed >30 degrees: Yes  Ulcer ppx: Yes, coagulopathy  Glucose within target range: Yes, in range    Does patient need/have an active type/screen? NA    RASS at goal? Yes  [place] Agitation Assessment Scale (RASS) : -4 (07/10/2020 10:00 AM)     Can antipsychotics be stopped? N/A, not on antipsychotics  CAM-ICU Result: Positive (07/09/2020  8:00 PM)      Would hospice care be appropriate for this patient? No, patient improving or expected to improve    Patient Lines/Drains/Airways Status    Active Active Lines, Drains, & Airways     Name:   Placement date:   Placement time:   Site:   Days:    ETT  7.5   July 22, 2020    1700     7    CVC Triple Lumen 2020-07-22 Non-tunneled Right Internal jugular   July 22, 2020    1945    Internal jugular   7    Hemodialysis Catheter With Distal Infusion Port 07/09/20 Left Internal jugular 1.4 mL 1.4 mL   07/09/20    1700    Internal jugular   less than 1    NG/OG Tube Decompression;Feedings Center mouth   07/03/20    0610    Center mouth   7    Urethral Catheter   July 22, 2020    ???    ???   8    Arterial Line 22-Jul-2020 Right Radial   July 22, 2020    2315    Radial   7              Patient Lines/Drains/Airways Status    Active Wounds     None  Goals of Care     Code Status: Full Code    Designated Healthcare Decision Maker:  Evelyn Rollins current decisional capacity for healthcare decision-making is incapacitated. Her designated Educational psychologist) is/are her husband .      Subjective     Intubated, sedated    Objective     Vitals - past 24 hours  Temp:  [36.5 ??C (97.7 ??F)-37.1 ??C (98.8 ??F)] 36.5 ??C (97.7 ??F)  Heart Rate:  [52-74] 59  SpO2 Pulse:  [52-75] 59  Resp:  [0-30] 3  FiO2 (%):  [55 %-100 %] 100 %  SpO2:  [81 %-100 %] 96 % Intake/Output  I/O last 3 completed shifts:  In: 2918.6 [I.V.:1348.6; NG/GT:1420; IV Piggyback:150]  Out: 3301 [Urine:2320; Emesis/NG output:450; Other:81; Stool:450]     ??  Physical Exam:   General: NAD, well nourished, obese, intubated, sedated   HEENT:  normocephalic, trachea mdl, anicteric, no scleral edema, no conjuctival erythema/drainage, MMM and pink   CV: RRR. S1 and S2 normal, no m/r/c/g. no bruit, or JVD. DP Pulses 2  equal. 1+ BLE edema.  Lungs:   chest rise symm, diminished bases. CTA bilat, no wheezes/crackles/rhonchi. Good air movement.  Skin:   w/d/i, no jaundice present, no rashes, lesions, petechiae or breakdown. no drng, erythema.    Abd: contour, abdomen soft, non-tender and not distended. Normoactive bowel sounds x 4 quads, no rebound tenderness or guarding.   Ext: No cyanosis, bruising, clubbing or edema.   Neuro: perrl 3Sl, mdl, orbital edema, +c/c/g,     Continuous Infusions:   ??? HYDROmorphone 11 mg/hr (07/10/20 0909)   ??? midazolam (1 mg/mL) infusion 12 mg/hr (07/10/20 0758)   ??? norepinephrine bitartrate-NS Stopped (07/10/20 0201)   ??? NxStage RFP 400 (+/- BB) 5000 mL - contains 2 mEq/L of potassium     ??? NxStage RFP 401 (+/- BB) 5000 mL - contains 4 mEq/L of potassium         Scheduled Medications:   ??? aspirin  81 mg Enteral tube: gastric  Daily   ??? buPROPion  100 mg Enteral tube: gastric  TID   ??? cefazolin  2 g Intravenous Q12H   ??? chlorhexidine  5 mL Mouth BID   ??? dexamethasone  6 mg Intravenous Q24H   ??? escitalopram oxalate  20 mg Oral Daily   ??? famotidine  20 mg Enteral tube: gastric  Daily   ??? heparin (porcine) for subcutaneous use  7,500 Units Subcutaneous [facility]   ??? insulin regular  0-12 Units Subcutaneous Q6H [facility]   ??? levETIRAcetam  500 mg Enteral tube: gastric  Daily   ??? levothyroxine  100 mcg Enteral tube: gastric  daily   ??? LORazepam  8 mg Enteral tube: gastric  Q4H   ??? oxyCODONE  60 mg Enteral tube: gastric  Q4H   ??? polyethylen glycol  17 g Enteral tube: gastric  TID   ??? pravastatin  40 mg Enteral tube: gastric  Daily   ??? QUEtiapine  50 mg Oral TID   ??? sennosides  10 mL Enteral tube: gastric  Nightly   ??? VECuronium  10 mg Intravenous Once       PRN medications:  dextrose 50 % in water (D50W), dextrose 50 % in water (D50W), HYDROmorphone, midazolam    Data/Imaging Review: Reviewed in Epic and personally interpreted on 07/10/2020. See EMR for detailed results.      Critical Care Attestation     This patient is critically ill or injured  with the impairment of vital organ systems such that there is a high probability of imminent or life threatening deterioration in the patient's condition. This patient must remain in the ICU for ongoing evaluation of the comprehensive management plan outlined in this note. I directly provided critical care services as documented in this note and the critical care time spent (45 min) is exclusive of separately billable procedures.    Lanae Crumbly, NP

## 2020-07-10 NOTE — Unmapped (Signed)
MICU Nightshift Note     Date of Service: 07/09/2020    Active Problems:    Liver transplant recipient (CMS-HCC)    Morbid obesity with BMI of 40.0-44.9, adult (CMS-HCC)    Acute hypoxemic respiratory failure due to severe acute respiratory syndrome coronavirus 2 (SARS-CoV-2) disease (CMS-HCC)  Resolved Problems:    * No resolved hospital problems. *    Remained in prone position throughout the evening. PCV to PRVC. Required ventilator exchange due to persistent alarming for FiO2 set/delivery discordance. Desaturation to 85% during bagging for ventilator exchange. CRRT throughout the evening with minimal UF.    Plan summary     ARDS:  - prone positioning and pharmacologic paralysis  - lung protective ventilation, has required 7 ml/kg IBW tidal volumes for persistent acidemia but improving on CRRT. PaCO2 remains stable in the mid-40s.  - VAP bundle  - P:F ratio remains >200 in prone position    AKI:  - continuation of CRRT with minimal UF    Guido Sander, MD

## 2020-07-10 NOTE — Unmapped (Signed)
This shift, patient supinated for vas cath placement. Re-pronation occurred after line placed/confirmed by x-ray. Dialysis charge nurse notified of orders, currently awaiting nxstage machine and initiation of CRRT. Patient remains sedated on dilaudid and versed drips, propofol drip initiated for additional sedation due to ventilator dysynchrony. Scheduled oxycodone and ativan enteral doses increased as well. Norepinephrine restarted and titrated to maintain MAP >65. Heparin drip initiated per ACS/low intensity nomogram. Patient's clinical status remains ICU level of care.     Problem: Adult Inpatient Plan of Care  Goal: Plan of Care Review  Outcome: Ongoing - Unchanged  Goal: Patient-Specific Goal (Individualization)  Outcome: Ongoing - Unchanged  Goal: Absence of Hospital-Acquired Illness or Injury  Outcome: Ongoing - Unchanged  Goal: Optimal Comfort and Wellbeing  Outcome: Ongoing - Unchanged  Goal: Readiness for Transition of Care  Outcome: Ongoing - Unchanged  Goal: Rounds/Family Conference  Outcome: Ongoing - Unchanged     Problem: Infection  Goal: Infection Symptom Resolution  Outcome: Ongoing - Unchanged     Problem: Communication Impairment (Mechanical Ventilation, Invasive)  Goal: Effective Communication  Outcome: Ongoing - Unchanged     Problem: Inability to Wean (Mechanical Ventilation, Invasive)  Goal: Mechanical Ventilation Liberation  Outcome: Ongoing - Unchanged     Problem: Gas Exchange Impaired  Goal: Optimal Gas Exchange  Outcome: Ongoing - Unchanged     Problem: Electrolyte Imbalance (Acute Kidney Injury/Impairment)  Goal: Serum Electrolyte Balance  Outcome: Ongoing - Unchanged

## 2020-07-10 NOTE — Unmapped (Signed)
Patient remains intubated and on the vetniltor. Patient has a 7.5 ETT at 23cm at lips. Vent settings: PCV 16, PEEP 16, rate 30 and FiO2 100%. Patient has none to small amount of tan thick secretions. Patient is currently prone with no complications. Will continue to monitor.

## 2020-07-10 NOTE — Unmapped (Signed)
CVAD Liaison - Hemodialysis Catheter Insertion Note    The CVAD Liaison was contacted for the insertion of Central Venous Access Device (CVAD).  A chart review performed.   Indication: Dialysis    Prior to the start of the procedure, a time out was performed and the identity of the patient was confirmed via name, medical record number and date of birth.  The sterile field was prepared with necessary supplies and equipment verified.  Insertion site was prepped with chlorhexidine solution and allowed to dry.  Maximum sterile techniques was utilized.    CVAD was inserted by Davene Costain NP.  Catheter was aspirated and flushed.  Insertion site cleansed, and sterile dressing applied per manufacturer guidelines.  The Central Line Checklist was referenced.  CVAD Liaison was present during entire procedure.   Report of the procedure given to the Primary Nurse.    Dialysis lumen(s) were locked with 1,000 units/mL of Heparin per protocol by Provider      Thank you for this consult,  Caren Hazy RN    Consult Time 60 minutes (min)

## 2020-07-10 NOTE — Unmapped (Signed)
Tacrolimus Therapeutic Monitoring Pharmacy Note    Evelyn Rollins is a 54 y.o. female continuing tacrolimus.     Indication: Liver transplant     Date of Transplant: 2016      Prior Dosing Information: Home regimen  tacrolimus 2mg  am and 1mg  pm    Goals:  Therapeutic Drug Levels  Tacrolimus trough goal: 3-5 ng/mL per Consult note 07/03/20    Additional Clinical Monitoring/Outcomes  ?? Monitor renal function (SCr and urine output) and liver function (LFTs)  ?? Monitor for signs/symptoms of adverse events (e.g., hyperglycemia, hyperkalemia, hypomagnesemia, hypertension, headache, tremor)    Results:   Tacrolimus level: 5.9 ng/mL, drawn appropriately    Pharmacokinetic Considerations and Significant Drug Interactions:  ??? Concurrent hepatotoxic medications: none identified at time of note  ??? Concurrent CYP3A4 substrates/inhibitors: none identified at time of note  ??? Concurrent nephrotoxic medications: none identified at time of note    Assessment/Plan:  UNLESS INDICATED OTHERWISE BY TRANSPLANT TEAM    Recommendation(s)  ??? restart tacrolimus at 0.5mg  BID   ??? Will continue to watch levels daily    Follow-up  ??? daily levels at this time.   ??? A pharmacist will continue to monitor and recommend levels as appropriate    Longitudinal Dose Monitoring:  Date Dose (mg), Route AM Scr (mg/dL) Level  (ng/mL) Key Drug Interactions   07/30 HOLD + 0.5 NG 2.82 5.9 (0436) CRRT   07/29 HOLD 4.73 10.7 (0354) CRRT   07/28 1 + HOLD 4.11 9.2 (0744) HOLDING   07/27 2 + 0.5 NG 3.27 9.5 (0840) Reduce to 1mg  / 0.5mg    07/26 2+1 NG 2.50 5.8 (0650)    07/25 2+1 NG 1.38 3.5 (1610)    07/24 2+1 NG 1.52 2.3 (9604)    07/23 1mg  NG 1.3 1.8 (5409)         Please page service pharmacist with questions/clarifications.    Charleen Kirks, PharmD

## 2020-07-10 NOTE — Unmapped (Signed)
Central Venous Catheter Insertion Procedure Note     Date of Service: 07/09/2020    Patient Name: Evelyn Rollins  Patient MRN: [id number]    Line type:  Hemodialysis Catheter    Indications:  Hemodialysis    Procedure Details:   Informed consent was obtained after explanation of the risks and benefits of the procedure, refer to the consent documentation.      Time-out was performed immediately prior to the procedure.    The left internal jugular vein was identified using bedside ultrasound. This area was prepped and draped in the usual sterile fashion. Maximum sterile technique was used including antiseptics, cap, gloves, gown, hand hygiene, mask, and sterile sheet.  The patient was placed in Trendelenburg position. Local anesthesia with 1% lidocaine was applied subcutaneously then deep to the skin. The finder was then inserted into the internal jugular vein using ultrasound guidance.    Using the Seldinger Technique a Hemodialysis Catheter was placed with each port easily flushed and freely drawing venous blood.    The catheter was secured with sutures. A sterile CHG drsg was applied to the site.    Condition:  The patient tolerated the procedure well and remains in the same condition as pre-procedure.    Complications:  None; patient tolerated the procedure well.    Plan:  CXR confirmed appropriate placement of central line. CVAD order placed OK to use     Lanae Crumbly, NP

## 2020-07-11 LAB — BLOOD GAS CRITICAL CARE PANEL, ARTERIAL
BASE EXCESS ARTERIAL: -2.6 — ABNORMAL LOW (ref -2.0–2.0)
BASE EXCESS ARTERIAL: -1.5 (ref -2.0–2.0)
BASE EXCESS ARTERIAL: 0 (ref -2.0–2.0)
BASE EXCESS ARTERIAL: -1.9 (ref -2.0–2.0)
BASE EXCESS ARTERIAL: -3.2 — ABNORMAL LOW (ref -2.0–2.0)
BASE EXCESS ARTERIAL: -3.2 — ABNORMAL LOW (ref -2.0–2.0)
CALCIUM IONIZED ARTERIAL (MG/DL): 4.95 mg/dL (ref 4.40–5.40)
CALCIUM IONIZED ARTERIAL (MG/DL): 4.61 mg/dL (ref 4.40–5.40)
CALCIUM IONIZED ARTERIAL (MG/DL): 5.17 mg/dL (ref 4.40–5.40)
CALCIUM IONIZED ARTERIAL (MG/DL): 4.97 mg/dL (ref 4.40–5.40)
CALCIUM IONIZED ARTERIAL (MG/DL): 4.64 mg/dL (ref 4.40–5.40)
CALCIUM IONIZED ARTERIAL (MG/DL): 4.88 mg/dL (ref 4.40–5.40)
FIO2 ARTERIAL: 75
GLUCOSE WHOLE BLOOD: 249 mg/dL — ABNORMAL HIGH (ref 70–179)
GLUCOSE WHOLE BLOOD: 188 mg/dL — ABNORMAL HIGH (ref 70–179)
GLUCOSE WHOLE BLOOD: 146 mg/dL (ref 70–179)
GLUCOSE WHOLE BLOOD: 167 mg/dL (ref 70–179)
GLUCOSE WHOLE BLOOD: 173 mg/dL (ref 70–179)
GLUCOSE WHOLE BLOOD: 176 mg/dL (ref 70–179)
GLUCOSE WHOLE BLOOD: 175 mg/dL (ref 70–179)
HCO3 ARTERIAL: 24 mmol/L (ref 22–27)
HCO3 ARTERIAL: 24 mmol/L (ref 22–27)
HCO3 ARTERIAL: 26 mmol/L (ref 22–27)
HCO3 ARTERIAL: 27 mmol/L (ref 22–27)
HCO3 ARTERIAL: 26 mmol/L (ref 22–27)
HCO3 ARTERIAL: 24 mmol/L (ref 22–27)
HCO3 ARTERIAL: 24 mmol/L (ref 22–27)
HCO3 ARTERIAL: 23 mmol/L (ref 22–27)
HEMOGLOBIN BLOOD GAS: 11.2 g/dL — ABNORMAL LOW (ref 12.00–16.00)
HEMOGLOBIN BLOOD GAS: 9.7 g/dL — ABNORMAL LOW (ref 12.00–16.00)
HEMOGLOBIN BLOOD GAS: 9.9 g/dL — ABNORMAL LOW (ref 12.00–16.00)
HEMOGLOBIN BLOOD GAS: 11 g/dL — ABNORMAL LOW (ref 12.00–16.00)
HEMOGLOBIN BLOOD GAS: 10.7 g/dL — ABNORMAL LOW (ref 12.00–16.00)
LACTATE BLOOD ARTERIAL: 1.7 mmol/L — ABNORMAL HIGH (ref <1.3)
LACTATE BLOOD ARTERIAL: 2 mmol/L — ABNORMAL HIGH (ref <1.3)
LACTATE BLOOD ARTERIAL: 1.9 mmol/L — ABNORMAL HIGH (ref <1.3)
LACTATE BLOOD ARTERIAL: 1.4 mmol/L — ABNORMAL HIGH (ref <1.3)
LACTATE BLOOD ARTERIAL: 1.6 mmol/L — ABNORMAL HIGH (ref <1.3)
LACTATE BLOOD ARTERIAL: 2.2 mmol/L — ABNORMAL HIGH (ref <1.3)
LACTATE BLOOD ARTERIAL: 2.1 mmol/L — ABNORMAL HIGH (ref <1.3)
O2 SATURATION ARTERIAL: 98.3 % (ref 94.0–100.0)
O2 SATURATION ARTERIAL: 96.4 % (ref 94.0–100.0)
O2 SATURATION ARTERIAL: 93.3 % — ABNORMAL LOW (ref 94.0–100.0)
O2 SATURATION ARTERIAL: 95.9 % (ref 94.0–100.0)
O2 SATURATION ARTERIAL: 89.6 % — ABNORMAL LOW (ref 94.0–100.0)
O2 SATURATION ARTERIAL: 95.6 % (ref 94.0–100.0)
PCO2 ARTERIAL: 60.4 mmHg — ABNORMAL HIGH (ref 35.0–45.0)
PCO2 ARTERIAL: 46 mmHg — ABNORMAL HIGH (ref 35.0–45.0)
PCO2 ARTERIAL: 49.7 mmHg — ABNORMAL HIGH (ref 35.0–45.0)
PCO2 ARTERIAL: 54.5 mmHg — ABNORMAL HIGH (ref 35.0–45.0)
PCO2 ARTERIAL: 61.3 mmHg — ABNORMAL HIGH (ref 35.0–45.0)
PCO2 ARTERIAL: 58.2 mmHg — ABNORMAL HIGH (ref 35.0–45.0)
PH ARTERIAL: 7.34 — ABNORMAL LOW (ref 7.35–7.45)
PH ARTERIAL: 7.3 — ABNORMAL LOW (ref 7.35–7.45)
PH ARTERIAL: 7.16 — CL (ref 7.35–7.45)
PH ARTERIAL: 7.22 — ABNORMAL LOW (ref 7.35–7.45)
PO2 ARTERIAL: 125 mmHg — ABNORMAL HIGH (ref 80.0–110.0)
PO2 ARTERIAL: 87.2 mmHg (ref 80.0–110.0)
PO2 ARTERIAL: 101 mmHg (ref 80.0–110.0)
PO2 ARTERIAL: 66.5 mmHg — ABNORMAL LOW (ref 80.0–110.0)
PO2 ARTERIAL: 88.8 mmHg (ref 80.0–110.0)
PO2 ARTERIAL: 80.7 mmHg (ref 80.0–110.0)
POTASSIUM WHOLE BLOOD: 5.3 mmol/L — ABNORMAL HIGH (ref 3.4–4.6)
POTASSIUM WHOLE BLOOD: 4.6 mmol/L (ref 3.4–4.6)
POTASSIUM WHOLE BLOOD: 4.8 mmol/L — ABNORMAL HIGH (ref 3.4–4.6)
POTASSIUM WHOLE BLOOD: 4.5 mmol/L (ref 3.4–4.6)
POTASSIUM WHOLE BLOOD: 4.5 mmol/L (ref 3.4–4.6)
POTASSIUM WHOLE BLOOD: 4.9 mmol/L — ABNORMAL HIGH (ref 3.4–4.6)
SODIUM WHOLE BLOOD: 138 mmol/L (ref 135–145)
SODIUM WHOLE BLOOD: 138 mmol/L (ref 135–145)
SODIUM WHOLE BLOOD: 139 mmol/L (ref 135–145)
SODIUM WHOLE BLOOD: 139 mmol/L (ref 135–145)
SODIUM WHOLE BLOOD: 136 mmol/L (ref 135–145)
SODIUM WHOLE BLOOD: 139 mmol/L (ref 135–145)

## 2020-07-11 LAB — CBC
HEMATOCRIT: 32.9 % — ABNORMAL LOW (ref 36.0–46.0)
HEMATOCRIT: 31 % — ABNORMAL LOW (ref 36.0–46.0)
HEMOGLOBIN: 10.2 g/dL — ABNORMAL LOW (ref 12.0–16.0)
HEMOGLOBIN: 11.1 g/dL — ABNORMAL LOW (ref 12.0–16.0)
MEAN CORPUSCULAR HEMOGLOBIN CONC: 31.7 g/dL (ref 31.0–37.0)
MEAN CORPUSCULAR HEMOGLOBIN CONC: 32.8 g/dL (ref 31.0–37.0)
MEAN CORPUSCULAR HEMOGLOBIN CONC: 30.8 g/dL — ABNORMAL LOW (ref 31.0–37.0)
MEAN CORPUSCULAR HEMOGLOBIN: 29.1 pg (ref 26.0–34.0)
MEAN CORPUSCULAR HEMOGLOBIN: 30 pg (ref 26.0–34.0)
MEAN CORPUSCULAR HEMOGLOBIN: 28.5 pg (ref 26.0–34.0)
MEAN CORPUSCULAR VOLUME: 92 fL (ref 80.0–100.0)
MEAN CORPUSCULAR VOLUME: 91.6 fL (ref 80.0–100.0)
MEAN CORPUSCULAR VOLUME: 92.7 fL (ref 80.0–100.0)
MEAN PLATELET VOLUME: 8.8 fL (ref 7.0–10.0)
MEAN PLATELET VOLUME: 8.7 fL (ref 7.0–10.0)
MEAN PLATELET VOLUME: 8.4 fL (ref 7.0–10.0)
PLATELET COUNT: 256 10*9/L (ref 150–440)
PLATELET COUNT: 218 10*9/L (ref 150–440)
RED BLOOD CELL COUNT: 3.58 10*12/L — ABNORMAL LOW (ref 4.00–5.20)
RED BLOOD CELL COUNT: 3.38 10*12/L — ABNORMAL LOW (ref 4.00–5.20)
RED BLOOD CELL COUNT: 3.89 10*12/L — ABNORMAL LOW (ref 4.00–5.20)
RED CELL DISTRIBUTION WIDTH: 14.7 % (ref 12.0–15.0)
WBC ADJUSTED: 11 10*9/L (ref 4.5–11.0)
WBC ADJUSTED: 8.2 10*9/L (ref 4.5–11.0)
WBC ADJUSTED: 20.1 10*9/L — ABNORMAL HIGH (ref 4.5–11.0)

## 2020-07-11 LAB — COMPREHENSIVE METABOLIC PANEL
ALBUMIN: 1.9 g/dL — ABNORMAL LOW (ref 3.4–5.0)
ALBUMIN: 1.7 g/dL — ABNORMAL LOW (ref 3.4–5.0)
ALBUMIN: 1.7 g/dL — ABNORMAL LOW (ref 3.4–5.0)
ALBUMIN: 1.7 g/dL — ABNORMAL LOW (ref 3.4–5.0)
ALKALINE PHOSPHATASE: 59 U/L (ref 46–116)
ALKALINE PHOSPHATASE: 52 U/L (ref 46–116)
ALKALINE PHOSPHATASE: 49 U/L (ref 46–116)
ALKALINE PHOSPHATASE: 65 U/L (ref 46–116)
ALT (SGPT): 19 U/L (ref 10–49)
ALT (SGPT): 13 U/L (ref 10–49)
ALT (SGPT): 13 U/L (ref 10–49)
ALT (SGPT): 17 U/L (ref 10–49)
ANION GAP: 7 mmol/L (ref 5–14)
ANION GAP: 7 mmol/L (ref 5–14)
ANION GAP: 3 mmol/L — ABNORMAL LOW (ref 5–14)
ANION GAP: 4 mmol/L — ABNORMAL LOW (ref 5–14)
AST (SGOT): 42 U/L — ABNORMAL HIGH (ref <=34)
AST (SGOT): 26 U/L (ref <=34)
AST (SGOT): 35 U/L — ABNORMAL HIGH (ref <=34)
AST (SGOT): 40 U/L — ABNORMAL HIGH (ref <=34)
BILIRUBIN TOTAL: 0.3 mg/dL (ref 0.3–1.2)
BILIRUBIN TOTAL: 0.2 mg/dL — ABNORMAL LOW (ref 0.3–1.2)
BILIRUBIN TOTAL: 0.2 mg/dL — ABNORMAL LOW (ref 0.3–1.2)
BILIRUBIN TOTAL: 0.2 mg/dL — ABNORMAL LOW (ref 0.3–1.2)
BLOOD UREA NITROGEN: 63 mg/dL — ABNORMAL HIGH (ref 9–23)
BLOOD UREA NITROGEN: 49 mg/dL — ABNORMAL HIGH (ref 9–23)
BLOOD UREA NITROGEN: 37 mg/dL — ABNORMAL HIGH (ref 9–23)
BLOOD UREA NITROGEN: 38 mg/dL — ABNORMAL HIGH (ref 9–23)
BUN / CREAT RATIO: 32
BUN / CREAT RATIO: 32
BUN / CREAT RATIO: 29
BUN / CREAT RATIO: 25
CALCIUM: 8.2 mg/dL — ABNORMAL LOW (ref 8.7–10.4)
CALCIUM: 8.1 mg/dL — ABNORMAL LOW (ref 8.7–10.4)
CALCIUM: 8.4 mg/dL — ABNORMAL LOW (ref 8.7–10.4)
CALCIUM: 8.1 mg/dL — ABNORMAL LOW (ref 8.7–10.4)
CHLORIDE: 106 mmol/L (ref 98–107)
CHLORIDE: 106 mmol/L (ref 98–107)
CHLORIDE: 107 mmol/L (ref 98–107)
CHLORIDE: 106 mmol/L (ref 98–107)
CO2: 26 mmol/L (ref 20.0–31.0)
CO2: 27 mmol/L (ref 20.0–31.0)
CO2: 26 mmol/L (ref 20.0–31.0)
CO2: 25 mmol/L (ref 20.0–31.0)
CREATININE: 1.97 mg/dL — ABNORMAL HIGH
CREATININE: 1.51 mg/dL — ABNORMAL HIGH
CREATININE: 1.51 mg/dL — ABNORMAL HIGH
EGFR CKD-EPI AA FEMALE: 33 mL/min/{1.73_m2} — ABNORMAL LOW (ref >=60)
EGFR CKD-EPI AA FEMALE: 45 mL/min/{1.73_m2} — ABNORMAL LOW (ref >=60)
EGFR CKD-EPI AA FEMALE: 55 mL/min/{1.73_m2} — ABNORMAL LOW (ref >=60)
EGFR CKD-EPI AA FEMALE: 45 mL/min/{1.73_m2} — ABNORMAL LOW (ref >=60)
EGFR CKD-EPI NON-AA FEMALE: 28 mL/min/{1.73_m2} — ABNORMAL LOW (ref >=60)
EGFR CKD-EPI NON-AA FEMALE: 39 mL/min/{1.73_m2} — ABNORMAL LOW (ref >=60)
EGFR CKD-EPI NON-AA FEMALE: 48 mL/min/{1.73_m2} — ABNORMAL LOW (ref >=60)
EGFR CKD-EPI NON-AA FEMALE: 39 mL/min/{1.73_m2} — ABNORMAL LOW (ref >=60)
GLUCOSE RANDOM: 243 mg/dL — ABNORMAL HIGH (ref 70–179)
GLUCOSE RANDOM: 221 mg/dL — ABNORMAL HIGH (ref 70–179)
GLUCOSE RANDOM: 151 mg/dL (ref 70–179)
GLUCOSE RANDOM: 191 mg/dL — ABNORMAL HIGH (ref 70–179)
POTASSIUM: 5.2 mmol/L — ABNORMAL HIGH (ref 3.4–4.5)
POTASSIUM: 5 mmol/L — ABNORMAL HIGH (ref 3.4–4.5)
POTASSIUM: 4.6 mmol/L — ABNORMAL HIGH (ref 3.4–4.5)
POTASSIUM: 4.7 mmol/L — ABNORMAL HIGH (ref 3.4–4.5)
PROTEIN TOTAL: 5.3 g/dL — ABNORMAL LOW (ref 5.7–8.2)
SODIUM: 139 mmol/L (ref 135–145)
SODIUM: 140 mmol/L (ref 135–145)
SODIUM: 136 mmol/L (ref 135–145)
SODIUM: 135 mmol/L (ref 135–145)

## 2020-07-11 LAB — MAGNESIUM
Magnesium:MCnc:Pt:Ser/Plas:Qn:: 1.8
Magnesium:MCnc:Pt:Ser/Plas:Qn:: 1.8
Magnesium:MCnc:Pt:Ser/Plas:Qn:: 1.9

## 2020-07-11 LAB — HEPARIN CORRELATION
Lab: <0.2
Lab: <0.2

## 2020-07-11 LAB — PHOSPHORUS
Phosphate:MCnc:Pt:Ser/Plas:Qn:: 4
Phosphate:MCnc:Pt:Ser/Plas:Qn:: 4.6
Phosphate:MCnc:Pt:Ser/Plas:Qn:: 6.4 — ABNORMAL HIGH

## 2020-07-11 LAB — RED CELL DISTRIBUTION WIDTH: Lab: 14.7

## 2020-07-11 LAB — EGFR CKD-EPI AA FEMALE
Glomerular filtration rate/1.73 sq M.predicted.black:ArVRat:Pt:Ser/Plas/Bld:Qn:Creatinine-based formula (CKD-EPI): 45 — ABNORMAL LOW

## 2020-07-11 LAB — BLOOD GAS, ARTERIAL
BASE EXCESS ARTERIAL: -1.9 (ref -2.0–2.0)
HCO3 ARTERIAL: 24 mmol/L (ref 22–27)
O2 SATURATION ARTERIAL: 96.3 % (ref 94.0–100.0)
PH ARTERIAL: 7.27 — ABNORMAL LOW (ref 7.35–7.45)
PO2 ARTERIAL: 94.3 mmHg (ref 80.0–110.0)

## 2020-07-11 LAB — GLUCOSE WHOLE BLOOD
Glucose:MCnc:Pt:Bld:Qn:: 167
Glucose:MCnc:Pt:Bld:Qn:: 226 — ABNORMAL HIGH

## 2020-07-11 LAB — WBC ADJUSTED: Leukocytes:NCnc:Pt:Bld:Qn:: 20.1 — ABNORMAL HIGH

## 2020-07-11 LAB — PO2 ARTERIAL: Oxygen:PPres:Pt:BldA:Qn:: 87.2

## 2020-07-11 LAB — MEAN PLATELET VOLUME: Platelet mean volume:EntVol:Pt:Bld:Qn:Automated count: 8.7

## 2020-07-11 LAB — FIBRINOGEN LEVEL: Fibrinogen:MCnc:Pt:PPP:Qn:Coag: 498 — ABNORMAL HIGH

## 2020-07-11 LAB — POTASSIUM: Potassium:SCnc:Pt:Ser/Plas:Qn:: 5.2 — ABNORMAL HIGH

## 2020-07-11 LAB — BUN / CREAT RATIO: Urea nitrogen/Creatinine:MRto:Pt:Ser/Plas:Qn:: 29

## 2020-07-11 LAB — APTT
APTT: 23.4 s — ABNORMAL LOW (ref 24.9–36.9)
APTT: 23.9 s — ABNORMAL LOW (ref 24.9–36.9)
APTT: 24.8 s — ABNORMAL LOW (ref 24.9–36.9)
Coagulation surface induced:Time:Pt:PPP:Qn:Coag: 23.4 — ABNORMAL LOW
Coagulation surface induced:Time:Pt:PPP:Qn:Coag: 23.9 — ABNORMAL LOW

## 2020-07-11 LAB — FIO2 ARTERIAL
Blood gas studies:Cmplx:-:^Patient:Set:: 75
Blood gas studies:Cmplx:-:^Patient:Set:: 90

## 2020-07-11 LAB — TACROLIMUS, TROUGH: Lab: 2.9 — ABNORMAL LOW

## 2020-07-11 LAB — CALCIUM IONIZED ARTERIAL (MG/DL): Calcium.ionized:MCnc:Pt:Bld:Qn:: 4.95

## 2020-07-11 LAB — BASE EXCESS ARTERIAL: Base excess:SCnc:Pt:BldA:Qn:Calculated: 0

## 2020-07-11 LAB — PROTIME: Coagulation tissue factor induced:Time:Pt:PPP:Qn:Coag: 12

## 2020-07-11 LAB — D-DIMER QUANTITATIVE (CW,ML,HL)
Lab: 1540 — ABNORMAL HIGH
Lab: 2365 — ABNORMAL HIGH

## 2020-07-11 LAB — GLUCOSE RANDOM: Glucose:MCnc:Pt:Ser/Plas:Qn:: 191 — ABNORMAL HIGH

## 2020-07-11 LAB — SODIUM WHOLE BLOOD: Sodium:SCnc:Pt:Bld:Qn:: 136

## 2020-07-11 LAB — O2 SATURATION ARTERIAL: Oxygen saturation:MFr:Pt:BldA:Qn:: 94.5

## 2020-07-11 MED ADMIN — oxyCODONE (ROXICODONE) immediate release tablet 60 mg: 60 mg | GASTROENTERAL | @ 21:00:00 | Stop: 2020-07-17

## 2020-07-11 MED ADMIN — NxStage RFP 401 (+/- BB) 5000 mL - contains 4 mEq/L of potassium dialysis solution 5,000 mL: 5000 mL | INTRAVENOUS_CENTRAL | @ 16:00:00

## 2020-07-11 MED ADMIN — oxyCODONE (ROXICODONE) immediate release tablet 60 mg: 60 mg | GASTROENTERAL | @ 07:00:00 | Stop: 2020-07-17

## 2020-07-11 MED ADMIN — HYDROmorphone (PF) (DILAUDID) injection 8 mg: 8 mg | INTRAVENOUS | @ 14:00:00

## 2020-07-11 MED ADMIN — insulin NPH (HumuLIN,NovoLIN) injection 5 Units: 5 [IU] | SUBCUTANEOUS | @ 10:00:00 | Stop: 2020-07-11

## 2020-07-11 MED ADMIN — polyethylene glycol (MIRALAX) packet 17 g: 17 g | GASTROENTERAL | @ 12:00:00

## 2020-07-11 MED ADMIN — LORazepam (ATIVAN) tablet 8 mg: 8 mg | GASTROENTERAL

## 2020-07-11 MED ADMIN — polyethylene glycol (MIRALAX) packet 17 g: 17 g | GASTROENTERAL | @ 17:00:00

## 2020-07-11 MED ADMIN — alteplase (ACTIVASE) injection small catheter clearance 2 mg: 2 mg | @ 02:00:00 | Stop: 2020-07-10

## 2020-07-11 MED ADMIN — VECuronium (NORCURON) injection 10 mg: 10 mg | INTRAVENOUS | @ 06:00:00 | Stop: 2020-07-11

## 2020-07-11 MED ADMIN — HYDROmorphone 1 mg/mL in 0.9% sodium chloride: 0-12 mg/h | INTRAVENOUS | @ 10:00:00 | Stop: 2020-07-16

## 2020-07-11 MED ADMIN — CISATRacurium (NIMBEX) 200 mg/100 mL (2 mg/mL) continuous infusion: 0-10 ug/kg/min | INTRAVENOUS | @ 15:00:00

## 2020-07-11 MED ADMIN — ceFAZolin (ANCEF) IVPB 2 g in 50 ml dextrose (premix): 2 g | INTRAVENOUS | @ 13:00:00 | Stop: 2020-07-12

## 2020-07-11 MED ADMIN — heparin (porcine) 7,500 units/0.75 mL syringe: 7500 [IU] | SUBCUTANEOUS | @ 09:00:00

## 2020-07-11 MED ADMIN — HYDROmorphone 1 mg/mL in 0.9% sodium chloride: 0-12 mg/h | INTRAVENOUS | @ 12:00:00 | Stop: 2020-07-16

## 2020-07-11 MED ADMIN — pravastatin (PRAVACHOL) tablet 40 mg: 40 mg | GASTROENTERAL | @ 12:00:00

## 2020-07-11 MED ADMIN — polyethylene glycol (MIRALAX) packet 17 g: 17 g | GASTROENTERAL

## 2020-07-11 MED ADMIN — QUEtiapine (SEROquel) tablet 50 mg: 50 mg | GASTROENTERAL

## 2020-07-11 MED ADMIN — escitalopram oxalate (LEXAPRO) tablet 20 mg: 20 mg | ORAL | @ 12:00:00

## 2020-07-11 MED ADMIN — HYDROmorphone 1 mg/mL in 0.9% sodium chloride: 0-12 mg/h | INTRAVENOUS | @ 20:00:00 | Stop: 2020-07-16

## 2020-07-11 MED ADMIN — midazolam (VERSED) injection 10 mg: 10 mg | INTRAVENOUS

## 2020-07-11 MED ADMIN — tacrolimus (PROGRAF) oral suspension: .5 mg | GASTROENTERAL | @ 12:00:00

## 2020-07-11 MED ADMIN — midazolam in sodium chloride 0.9% (1 mg/mL) infusion PMB: 0-12 mg/h | INTRAVENOUS | @ 06:00:00

## 2020-07-11 MED ADMIN — LORazepam (ATIVAN) tablet 8 mg: 8 mg | GASTROENTERAL | @ 15:00:00

## 2020-07-11 MED ADMIN — NxStage RFP 401 (+/- BB) 5000 mL - contains 4 mEq/L of potassium dialysis solution 5,000 mL: 5000 mL | INTRAVENOUS_CENTRAL | @ 01:00:00

## 2020-07-11 MED ADMIN — norepinephrine 8 mg in sodium chloride 0.9 % 250 mL (32mcg/mL) infusion PMB: 0-30 ug/min | INTRAVENOUS | @ 08:00:00

## 2020-07-11 MED ADMIN — famotidine (PEPCID) tablet 20 mg: 20 mg | GASTROENTERAL | @ 12:00:00

## 2020-07-11 MED ADMIN — HYDROmorphone (PF) (DILAUDID) injection 8 mg: 8 mg | INTRAVENOUS | @ 08:00:00

## 2020-07-11 MED ADMIN — NxStage RFP 401 (+/- BB) 5000 mL - contains 4 mEq/L of potassium dialysis solution 5,000 mL: 5000 mL | INTRAVENOUS_CENTRAL | @ 05:00:00

## 2020-07-11 MED ADMIN — heparin (porcine) 7,500 units/0.75 mL syringe: 7500 [IU] | SUBCUTANEOUS | @ 17:00:00

## 2020-07-11 MED ADMIN — VECuronium (NORCURON) 10 mg injection: INTRAVENOUS | @ 18:00:00 | Stop: 2020-07-11

## 2020-07-11 MED ADMIN — HYDROmorphone 1 mg/mL in 0.9% sodium chloride: 0-12 mg/h | INTRAVENOUS | @ 16:00:00 | Stop: 2020-07-16

## 2020-07-11 MED ADMIN — LORazepam (ATIVAN) tablet 8 mg: 8 mg | GASTROENTERAL | @ 12:00:00

## 2020-07-11 MED ADMIN — midazolam in sodium chloride 0.9% (1 mg/mL) infusion PMB: 0-12 mg/h | INTRAVENOUS | @ 15:00:00

## 2020-07-11 MED ADMIN — HYDROmorphone (PF) (DILAUDID) injection 8 mg: 8 mg | INTRAVENOUS

## 2020-07-11 MED ADMIN — QUEtiapine (SEROquel) tablet 50 mg: 50 mg | GASTROENTERAL | @ 17:00:00

## 2020-07-11 MED ADMIN — LORazepam (ATIVAN) tablet 8 mg: 8 mg | GASTROENTERAL | @ 19:00:00

## 2020-07-11 MED ADMIN — VECuronium (NORCURON) injection 10 mg: 10 mg | INTRAVENOUS | @ 18:00:00 | Stop: 2020-07-11

## 2020-07-11 MED ADMIN — insulin regular (HumuLIN,NovoLIN) injection 0-12 Units: 0-12 [IU] | SUBCUTANEOUS | @ 04:00:00

## 2020-07-11 MED ADMIN — buPROPion (WELLBUTRIN) tablet 100 mg: 100 mg | GASTROENTERAL | @ 12:00:00

## 2020-07-11 MED ADMIN — chlorhexidine (PERIDEX) 0.12 % solution 5 mL: 5 mL | OROMUCOSAL | @ 12:00:00

## 2020-07-11 MED ADMIN — HYDROmorphone (PF) (DILAUDID) injection 8 mg: 8 mg | INTRAVENOUS | @ 18:00:00

## 2020-07-11 MED ADMIN — oxyCODONE (ROXICODONE) immediate release tablet 60 mg: 60 mg | GASTROENTERAL | @ 09:00:00 | Stop: 2020-07-17

## 2020-07-11 MED ADMIN — QUEtiapine (SEROquel) tablet 50 mg: 50 mg | GASTROENTERAL | @ 12:00:00

## 2020-07-11 MED ADMIN — midazolam (VERSED) injection 10 mg: 10 mg | INTRAVENOUS | @ 14:00:00

## 2020-07-11 MED ADMIN — midazolam (VERSED) injection 10 mg: 10 mg | INTRAVENOUS | @ 08:00:00

## 2020-07-11 MED ADMIN — NxStage RFP 401 (+/- BB) 5000 mL - contains 4 mEq/L of potassium dialysis solution 5,000 mL: 5000 mL | INTRAVENOUS_CENTRAL | @ 11:00:00

## 2020-07-11 MED ADMIN — buPROPion (WELLBUTRIN) tablet 100 mg: 100 mg | GASTROENTERAL | @ 17:00:00

## 2020-07-11 MED ADMIN — levothyroxine (SYNTHROID) tablet 100 mcg: 100 ug | GASTROENTERAL | @ 10:00:00

## 2020-07-11 MED ADMIN — sennosides (SENOKOT) oral syrup: 10 mL | GASTROENTERAL

## 2020-07-11 MED ADMIN — LORazepam (ATIVAN) tablet 8 mg: 8 mg | GASTROENTERAL | @ 09:00:00

## 2020-07-11 MED ADMIN — midazolam in sodium chloride 0.9% (1 mg/mL) infusion PMB: 0-12 mg/h | INTRAVENOUS | @ 22:00:00

## 2020-07-11 MED ADMIN — levETIRAcetam (KEPPRA) tablet 500 mg: 500 mg | GASTROENTERAL | @ 12:00:00

## 2020-07-11 MED ADMIN — oxyCODONE (ROXICODONE) immediate release tablet 60 mg: 60 mg | GASTROENTERAL | @ 13:00:00 | Stop: 2020-07-17

## 2020-07-11 MED ADMIN — LORazepam (ATIVAN) tablet 8 mg: 8 mg | GASTROENTERAL | @ 04:00:00

## 2020-07-11 MED ADMIN — norepinephrine 8 mg in sodium chloride 0.9 % 250 mL (32mcg/mL) infusion PMB: 0-30 ug/min | INTRAVENOUS | @ 16:00:00

## 2020-07-11 NOTE — Unmapped (Signed)
Continuous Renal Replacement  Dialysis Nurse Therapy Procedure Note    Treatment Type:  [facility] Number Of Days On Therapy:  0 Procedure Date:  07/11/2020 1:45 AM     TREATMENT STATUS:  Restarted  Patient and Treatment Status     None          Active Dialysis Orders (168h ago, onward)     Start     Ordered    07/09/20 1620  CRRT Orders - NxStage (Adult)  Continuous     Comments: Fluid Removal Rate parameters:  MAP   <85mmHg 10 mL/hr;  MAP   >65 mmHg 125 mL/hr;   Question Answer Comment   CRRT System: NxStage    Modality: CVVH    Access: Left Internal Jugular    BFR (mL/min): 200-350    Dialysate Flow Rate (mL/kg/hr): Other (Specify) 2.7L       07/09/20 1622              SYSTEM CHECK:  Machine Name: [id number]  Dialyzer: [id number]   Self Test Completed: Yes.        Alarms Connected To The Wall And Active:  No.    VITAL SIGNS:  Temp:  [35.9 ??C (96.6 ??F)-36.5 ??C (97.7 ??F)] 35.9 ??C (96.6 ??F)  Heart Rate:  [52-75] 75  SpO2 Pulse:  [52-75] 75  Resp:  [0-30] 30  SpO2:  [81 %-100 %] 95 %  A BP-2: (102-152)/(31-92) 143/51  MAP:  [53 mmHg-106 mmHg] 71 mmHg    ACCESS SITE:        Hemodialysis Catheter With Distal Infusion Port 07/09/20 Left Internal jugular 1.4 mL 1.4 mL (Active)   Site Assessment Clean;Dry;Intact 07/11/20 0043   Proximal Lumen Status / Patency Infusing 07/11/20 0043   Proximal Lumen Intervention Accessed 07/11/20 0043   Medial Lumen Status / Patency Infusing 07/11/20 0043   Medial Lumen Intervention Accessed 07/11/20 0043   Lumen 3, Distal Status / Patency Blood Return - Brisk 07/10/20 2000   Distal Lumen Intervention Flushed 07/10/20 0800   Exposed Catheter Length (cm) 0 cm 07/09/20 1700   IV Tubing and Needleless Injector Cap Change Due 07/14/20 07/10/20 2000   Dressing Type CHG gel;Occlusive 07/11/20 0043   Dressing Status      Clean;Dry;Intact/not removed 07/11/20 0043   Dressing Intervention No intervention needed 07/11/20 0043   Contraindicated due to: Dressing Intact surrounding insertion site 07/10/20 0800 Dressing Change Due 07/17/20 07/11/20 0043   Line Necessity Reviewed? Y 07/11/20 0043   Line Necessity Indications Yes - Hemodialysis 07/11/20 0043   Line Necessity Reviewed With covid mdi 07/10/20 2000          CATHETER FILL VOLUMES:     Arterial: 1.4 mL  Venous: 1.4 mL     Lab Results   Component Value Date    NA 138 07/11/2020    NA 139 07/11/2020    K 5.3 (H) 07/11/2020    K 5.2 (H) 07/11/2020    CL 106 07/11/2020    CO2 26.0 07/11/2020    BUN 63 (H) 07/11/2020     Lab Results   Component Value Date    CALCIUM 8.2 (L) 07/11/2020    CAION 4.95 07/11/2020    PHOS 6.4 (H) 07/11/2020    MG 1.8 07/11/2020        SETTINGS:  Blood Pump Rate: 300 mL/min  Replacement Fluid Rate:     Pre-Blood Pump Fluid Rate:    Hourly Fluid Removal Rate: 50 mL/hr  Dialysate Fluid Rate    Therapy Fluid Temperature:       ANTICOAGULANT:  None    ADDITIONAL COMMENTS:  None    HEMODIALYSIS ON-CALL NURSE PAGER NUMBER:  ?? Monday thru Saturday 0700 - 1730: Call the Dialysis Unit ext. [phone]   ?? After 1730 and all day Sunday: Call the Dialysis RN Pager Number [phone]     PROCEDURE REVIEW, VERIFICATION, HANDOFF:  CRRT settings verified, procedure reviewed, and instructions given to primary RN.     Primary CRRT RN Verifying: Amedeo Gory, RN Dialysis RN Verifying: Manley Mason

## 2020-07-11 NOTE — Unmapped (Signed)
[facility] Nephrology Continuous Renal Replacement Therapy Procedure Note     07/11/2020    Evelyn Rollins was seen and examined on CRRT    CHIEF COMPLAINT: Acute Kidney Disease    INTERVAL HISTORY: initiated CRRT yesterday    CURRENT DIALYSIS PRESCRIPTION:  Device: CRRT Device: NxStage  Therapy fluid: Therapy Fluid : NxStage RFP 401 - Contains 4 mEq/L KCL  Therapy fluid rate: Therapy Fluid Rate (L/hr): 2.7 L/hr  Blood flow rate: Blood Pump Rate (mL/min): 300 mL/min  Fluid removal rate: Hourly Fluid Removal Rate (mL/hr): 50 mL/hr    PHYSICAL EXAM:  Vitals:  Temp:  [35.9 ??C (96.6 ??F)-36.1 ??C (96.9 ??F)] 36.1 ??C (96.9 ??F)  Heart Rate:  [57-75] 60  SpO2 Pulse:  [52-75] 60  MAP:  [53 mmHg-106 mmHg] 66 mmHg  A BP-2: (102-145)/(31-92) 106/52  MAP:  [53 mmHg-106 mmHg] 66 mmHg    In/Outs:    Intake/Output Summary (Last 24 hours) at 07/11/2020 1028  Last data filed at 07/11/2020 0900  Gross per 24 hour   Intake 1293.99 ml   Output 1196 ml   Net 97.99 ml        Weights:  Admission Weight: (!) 104.8 kg (231 lb)  Last documented Weight: (!) 108 kg (238 lb 1.6 oz)  Weight Change from Previous Day: No weight listed for specified days    Assessment:   General: Appearing ill  Pulmonary: prone; intubated; lungs clear  Cardiovascular: distant heart sounds  Extremities: trace edema  Access: Left IJ non-tunneled catheter     LAB DATA:  Lab Results   Component Value Date    NA 138 07/11/2020    K 5.0 (H) 07/11/2020    CL 106 07/11/2020    CO2 27.0 07/11/2020    BUN 49 (H) 07/11/2020    CREATININE 1.51 (H) 07/11/2020    CALCIUM 8.1 (L) 07/11/2020    MG 1.9 07/11/2020    PHOS 4.0 07/11/2020    ALBUMIN 1.7 (L) 07/11/2020      Lab Results   Component Value Date    HCT 32.9 (L) 07/11/2020    HGB 10.4 (L) 07/11/2020    WBC 11.0 07/11/2020        ASSESSMENT/PLAN:  Acute Kidney Disease on Continuous Renal Replacement Therapy:  - UF goal: 100 mL/hr as tolerated  - Anticoagulation: N/A  - Renally dose all medications    Sharri Loya Sonia Baller, MD  [place] [facility]

## 2020-07-11 NOTE — Unmapped (Signed)
MICU Daily Progress Note     Date of Service: 07/11/2020    Problem List:   Active Problems:    Liver transplant recipient (CMS-HCC)    Morbid obesity with BMI of 40.0-44.9, adult (CMS-HCC)    Acute hypoxemic respiratory failure due to severe acute respiratory syndrome coronavirus 2 (SARS-CoV-2) disease (CMS-HCC)  Resolved Problems:    * No resolved hospital problems. *      Interval history:  Evelyn Rollins is a 54 y.o. female with PMH cirrhosis status post liver transplant 2016 on chronic immunosuppressive therapy, hypertension, diabetes, hypothyroidism who presented to [facility] on 7/22 with myalgias, sore throat, loss of taste and fevers  x5 days. When her symptoms had started, she took a home Covid test on 7/17 which was found to be positive. She has had poor p.o. intake for the last several days. Her husband also has similar symptoms. In the OSH ED she was found to be severely hypoxic with O2 saturations of 50% on room air. She was placed on a nonrebreather and current saturations increased to the low 90s. Chest x-ray shows bilateral infiltrates consistent with COVID-19 pneumonia. Her Covid test is positive. Inflammatory markers are elevated. She received a dose of Decadron in the emergency room.    24hr events:   -supinated yesterday during the day and re-proned remained prone overnight however desat episodes overnight required cis gtt, increased tidal volumes to aid in recruitment   -CRRT clotted off overnight and restarted, pH decreased to 7.22 while off    Neurological   Analgesia and Sedation  -continue dilaudid and versed gtt  - cis gtt (7/31 -   -goal RASS 3- to 4-  -PRN dilaudid and versed  -ativan 8q4h, oxy 60q4h  ??  Anxiety, depression  -restarted home meds Wellbutrin/Lexapro, and Seroquel in place of abilify  ??  Seizure history (not taking AEDs), ?seizure activity  -neurology consult >> c/f RUE weakness, MRI 7/27- no acute pathology  -vEEG negative for seizure x 48hrs >> discont monitoring per neuro 7/25  - MRI with possible sz focus, starting keppra 500 mg daily per neuro recommendations       Pulmonary   ARDS, Acute resp failure r/t COVID  - tolerating PRVC well  - cont lung protective ventilation strategies (55ml/kg, permissive hypercapnea) currently at 62ml/kg goal to titrate back to 80ml/kg  - decrease PEEP from 16 to 14 given improved PaO2 and pneumomediastinum  - supinated yesterday 7/30 at 9 am and re-proned at 1 pm on 7/30  - trial of supination today, likely will need re-proning for P/F ratio < 100   - f/u CXR    Cardiovascular   h/o HTN   - holding home antihtn  - previously on levo for MAP >65  - last echo 2016 w nml LVEF, enlrgd RV and deprssed RV fxn w mild phtn  -continue statin     Renal   ARF on CRRT:  - cont foley for accurate I/O during critical illness   - replete electrolytes prn and monitor daily  - nephrology following  - CRRT clotted off overnight on 7/30, restarted goal UF 150  - goal - 1L    Infectious Disease/Autoimmune   COVID 19 infection  Symptom onset:??7/17  Exposure: unknown  COVID PCR+: 7/17  OSH Admission:??7/22  Day of admission/transfer: 7/22  OSH Meds Given:??dexamethasone  COVID Specific??Meds:??dexamethasone 7/22, remdesivir 7/22  Vaccination status: not vaccinated  ??  Monitoring:  - Special airborne/contact precautions (If unavailable, droplet & contact  precautions)  - f/u daily CMP, CBC w/ diff, CRP, D-dimer, DIC, troponin, pro-BNP with weekly ferritin, LDH and cytokine level  - f/u q6h lactate; ABG  ??  Medications:  - completed remdesivir  - cont decadron 6mg  po  daily x 10d, started 7/22  ??  CAP/VAP: MSSA   - febrile, leukocytosis   - started cefep/vanc 7/25--> changed to ancef on 7/27  - pan cx for T>38.2   - f/u cx data   - wbc count rising, monitor     1/2 staph epi bactermia likely contaminant:  - will repeat blood cultures neg  - f/u ID recs     Cultures:  Blood Culture, Routine (no units)   Date Value   07/06/2020 No Growth at 4 days     Lower Respiratory Culture (no units)   Date Value   07/04/2020 3+ Methicillin-Susceptible Staphylococcus aureus (A)     WBC (10*9/L)   Date Value   07/11/2020 11.0     WBC, UA (/HPF)   Date Value   07/04/2020 3          FEN/GI   -trickle tube feeds > advance to goal  -s/p reglan x 24hrs--residuals improving   -bowel regimen w senna and miralax  -PPI  ??     Malnutrition Assessment: Not done yet.  Immunocompromised s/p Liver transplant in 2016  -restarted tacro 0.5 mg q12  - f/u tacro level  -HOLD cellcept    Heme/Coag   Hypercoagulable state 2/2 covid  -anticoagulation group C heparin  -LE dopplers neg for DVT 7/24  -no evid bleeding    Endocrine   History of DM2  -insulin gtt stopped  -ISS  - increase NPH to 10U q12  ??  Integumentary   - WOCN consulted for high risk skin assessment Yes.  - WOCN recs >> pending, ordered 7/25   - cont pressure mitigating precautions per skin policy    Prophylaxis/LDA/Restraints/Consults   Can CVC be removed? No: need for medications requiring central access (e.g. pressors)   Can A-line be removed? No: frequent ABGs  Can Foley be removed? No: Need continuous I/O  Mobility plan: Step 1 - Range of motion    Feeding: Trickle feeds, advance as tolerated  Analgesia: No pain issues  Sedation SAT/SBT: No PEEP > 8  Thromboembolic ppx: Enoxaparin  Head of bed >30 degrees: Yes  Ulcer ppx: Yes, coagulopathy  Glucose within target range: Yes, in range    Does patient need/have an active type/screen? NA    RASS at goal? Yes  [place] Agitation Assessment Scale (RASS) : -4 (07/11/2020 10:00 AM)     Can antipsychotics be stopped? N/A, not on antipsychotics  CAM-ICU Result: Positive (07/10/2020  8:00 PM)      Would hospice care be appropriate for this patient? No, patient improving or expected to improve    Patient Lines/Drains/Airways Status    Active Active Lines, Drains, & Airways     Name:   Placement date:   Placement time:   Site:   Days:    ETT  7.5   06/26/2020    1700     8    CVC Triple Lumen 06/11/2020 Non-tunneled Right Internal jugular   06/13/2020    1945    Internal jugular   8    Hemodialysis Catheter With Distal Infusion Port 07/09/20 Left Internal jugular 1.4 mL 1.4 mL   07/09/20    1700    Internal jugular   1  NG/OG Tube Decompression;Feedings Center mouth   07/03/20    0610    Center mouth   8    Urethral Catheter   06/19/2020    ???    ???   9    Arterial Line 06/18/2020 Right Radial   06/20/2020    2315    Radial   8              Patient Lines/Drains/Airways Status    Active Wounds     None                Goals of Care     Code Status: Full Code    Designated Healthcare Decision Maker:  Ms. Yoon current decisional capacity for healthcare decision-making is incapacitated. Her designated Educational psychologist) is/are her husband .      Subjective     Intubated, sedated    Objective     Vitals - past 24 hours  Temp:  [35.9 ??C (96.6 ??F)-36.1 ??C (96.9 ??F)] 36.1 ??C (96.9 ??F)  Heart Rate:  [57-75] 60  SpO2 Pulse:  [52-75] 60  Resp:  [16-32] 32  FiO2 (%):  [50 %-100 %] 60 %  SpO2:  [85 %-100 %] 96 % Intake/Output  I/O last 3 completed shifts:  In: 2708.1 [I.V.:1108.1; NG/GT:1550; IV Piggyback:50]  Out: 1699 [Urine:860; Other:339; Stool:500]     ??  Physical Exam:   General: NAD, well nourished, obese, intubated, sedated   HEENT:  normocephalic, trachea mdl, anicteric, no scleral edema, no conjuctival erythema/drainage, MMM and pink   CV: RRR. S1 and S2 normal, no m/r/c/g. no bruit, or JVD. DP Pulses 2  equal. 1+ BLE edema.  Lungs:   chest rise symm, diminished bases. CTA bilat, no wheezes/crackles/rhonchi. Good air movement.  Skin:   w/d/i, no jaundice present, no rashes, lesions, petechiae or breakdown. no drng, erythema.    Abd: contour, abdomen soft, non-tender and not distended. Normoactive bowel sounds x 4 quads, no rebound tenderness or guarding.   Ext: No cyanosis, bruising, clubbing or edema.   Neuro: perrl 3Sl, mdl, orbital edema, +c/c/g,     Continuous Infusions:   ??? CISATRacurium 2 mcg/kg/min (07/10/20 2201)   ??? HYDROmorphone 11 mg/hr (07/11/20 0751)   ??? midazolam (1 mg/mL) infusion 12 mg/hr (07/11/20 0156)   ??? norepinephrine bitartrate-NS Stopped (07/10/20 0201)   ??? NxStage RFP 400 (+/- BB) 5000 mL - contains 2 mEq/L of potassium     ??? NxStage RFP 401 (+/- BB) 5000 mL - contains 4 mEq/L of potassium         Scheduled Medications:   ??? aspirin  81 mg Enteral tube: gastric  Daily   ??? buPROPion  100 mg Enteral tube: gastric  TID   ??? cefazolin  2 g Intravenous Q12H   ??? chlorhexidine  5 mL Mouth BID   ??? dexamethasone  6 mg Intravenous Q24H   ??? escitalopram oxalate  20 mg Oral Daily   ??? famotidine  20 mg Enteral tube: gastric  Daily   ??? heparin (porcine) for subcutaneous use  7,500 Units Subcutaneous [facility]   ??? insulin NPH  10 Units Subcutaneous Q12H [facility]   ??? insulin regular  0-12 Units Subcutaneous Q6H [facility]   ??? levETIRAcetam  500 mg Enteral tube: gastric  Daily   ??? levothyroxine  100 mcg Enteral tube: gastric  daily   ??? LORazepam  8 mg Enteral tube: gastric  Q4H   ??? oxyCODONE  60 mg Enteral  tube: gastric  Q4H   ??? polyethylen glycol  17 g Enteral tube: gastric  TID   ??? pravastatin  40 mg Enteral tube: gastric  Daily   ??? QUEtiapine  50 mg Enteral tube: gastric  TID   ??? sennosides  10 mL Enteral tube: gastric  Nightly   ??? Tacrolimus  0.5 mg Enteral tube: gastric  BID       PRN medications:  CISATRacurium, dextrose 50 % in water (D50W), HYDROmorphone, midazolam    Data/Imaging Review: Reviewed in Epic and personally interpreted on 07/11/2020. See EMR for detailed results.      Critical Care Attestation     This patient is critically ill or injured with the impairment of vital organ systems such that there is a high probability of imminent or life threatening deterioration in the patient's condition. This patient must remain in the ICU for ongoing evaluation of the comprehensive management plan outlined in this note. I directly provided critical care services as documented in this note and the critical care time spent (45 min) is exclusive of separately billable procedures.    Toma Aran, PA

## 2020-07-11 NOTE — Unmapped (Signed)
Afebrile. SB 50 - NSR 60. MAP > 65. Supinated at 0945 per MDI Team C, initially desat to upper 70s but recovered within a couple minutes. SpO2 trending down and increasingly dyssynchronous on vent. Vecuronium given once, re-proned at 1300. PRN Versed and Dilaudid given several times as needed. Attempted to swim to face R side this evening after PRN Versed admin, poorly tolerated, SpO2 73%, MDI notified, recovered within a few minutes. Deferring full swim, micro-adjusting position. TF increased to goal rate while supine, resumed at trickle rate when re-proned. NPH added today. Good UO from [place] this morning, diminishing throughout the day. Remains on CRRT, 4K+. Updated daughter, Evelyn Rollins, this morning and evening.    Problem: Adult Inpatient Plan of Care  Goal: Plan of Care Review  Outcome: Ongoing - Unchanged  Goal: Patient-Specific Goal (Individualization)  Outcome: Ongoing - Unchanged  Goal: Absence of Hospital-Acquired Illness or Injury  Outcome: Ongoing - Unchanged  Goal: Optimal Comfort and Wellbeing  Outcome: Ongoing - Unchanged  Goal: Readiness for Transition of Care  Outcome: Ongoing - Unchanged  Goal: Rounds/Family Conference  Outcome: Ongoing - Unchanged     Problem: Skin Injury Risk Increased  Goal: Skin Health and Integrity  Outcome: Ongoing - Unchanged     Problem: Infection  Goal: Infection Symptom Resolution  Outcome: Ongoing - Unchanged     Problem: Fall Injury Risk  Goal: Absence of Fall and Fall-Related Injury  Outcome: Ongoing - Unchanged     Problem: Wound  Goal: Optimal Wound Healing  Outcome: Ongoing - Unchanged     Problem: Communication Impairment (Mechanical Ventilation, Invasive)  Goal: Effective Communication  Outcome: Ongoing - Unchanged     Problem: Device-Related Complication Risk (Mechanical Ventilation, Invasive)  Goal: Optimal Device Function  Outcome: Ongoing - Unchanged     Problem: Inability to Wean (Mechanical Ventilation, Invasive)  Goal: Mechanical Ventilation Liberation Outcome: Ongoing - Unchanged     Problem: Skin and Tissue Injury (Mechanical Ventilation, Invasive)  Goal: Absence of Device-Related Skin and Tissue Injury  Outcome: Ongoing - Unchanged     Problem: Ventilator-Induced Lung Injury (Mechanical Ventilation, Invasive)  Goal: Absence of Ventilator-Induced Lung Injury  Outcome: Ongoing - Unchanged     Problem: Self-Care Deficit  Goal: Improved Ability to Complete Activities of Daily Living  Outcome: Ongoing - Unchanged     Problem: Asthma Comorbidity  Goal: Maintenance of Asthma Control  Outcome: Ongoing - Unchanged     Problem: COPD Comorbidity  Goal: Maintenance of COPD Symptom Control  Outcome: Ongoing - Unchanged     Problem: Diabetes Comorbidity  Goal: Blood Glucose Level Within Desired Range  Outcome: Ongoing - Unchanged     Problem: Heart Failure Comorbidity  Goal: Maintenance of Heart Failure Symptom Control  Outcome: Ongoing - Unchanged     Problem: Hypertension Comorbidity  Goal: Blood Pressure in Desired Range  Outcome: Ongoing - Unchanged     Problem: Obstructive Sleep Apnea Risk or Actual (Comorbidity Management)  Goal: Unobstructed Breathing During Sleep  Outcome: Ongoing - Unchanged     Problem: Pain Chronic (Persistent) (Comorbidity Management)  Goal: Acceptable Pain Control and Functional Ability  Outcome: Ongoing - Unchanged     Problem: Seizure Disorder Comorbidity  Goal: Maintenance of Seizure Control  Outcome: Ongoing - Unchanged     Problem: LTC COVID-19 Confirmed or Rule-Out  Goal: Patient/Resident will remain free of complications due to COVID-19  Description: 1. Review and update the patient/resident's isolation status in the Isolation activity  2. Keep the patient/resident's door closed  at all times and limit movement of the patient/resident outside of the room to medically essential purposes. If applicable, transfer patient/resident to a single-person room   3. Educate and reinforce infection prevention and control practices recommended by CDC 4. Frequently monitor for development of more severe symptoms   5. Use appropriate PPE when providing care for patient/resident  6. Reinforce no visitor policy and non-essential health care personnel policy, except for certain compassionate care situations  7. If worsening of symptoms occur, alert the nearest Hospital caring for confirmed COVID-19 patients and arrange for transfer with proper precautions including placing a facemask on the patient/resident during transfer  8. Communicate information about known or suspected case of COVID-19 to appropriate public health personnel  9. Avoid procedures that are likely to induce coughing (e.g., sputum induction, open suctioning of airways). If required, do so in an Airborne Infection Isolation Room. The health care provider in the room should wear an N95 or higher-level respirator, eye protection, gloves, and a gown. The number of HCP present during the procedure should be limited to only those essential for patient/resident care and procedure support. Visitors should not be present for the procedure. Clean and disinfect procedure room surfaces promptly  10. Update patient/resident and family/representatives as needed      Outcome: Ongoing - Unchanged     Problem: Gas Exchange Impaired  Goal: Optimal Gas Exchange  Outcome: Ongoing - Unchanged    Problem: Electrolyte Imbalance (Acute Kidney Injury/Impairment)  Goal: Serum Electrolyte Balance  Outcome: Ongoing - Unchanged     Problem: Fluid Imbalance (Acute Kidney Injury/Impairment)  Goal: Optimal Fluid Balance  Outcome: Ongoing - Unchanged     Problem: Hematologic Alteration (Acute Kidney Injury/Impairment)  Goal: Hemoglobin, Hematocrit and Platelets Within Normal Range  Outcome: Ongoing - Unchanged     Problem: Oral Intake Inadequate (Acute Kidney Injury/Impairment)  Goal: Optimal Nutrition Intake  Outcome: Ongoing - Unchanged     Problem: Renal Function Impairment (Acute Kidney Injury/Impairment)  Goal: Effective Renal Function  Outcome: Ongoing - Unchanged

## 2020-07-11 NOTE — Unmapped (Signed)
HEPATOLOGY INPATIENT TREATMENT PLAN NOTE     Remains intubated. Enzymes stable. Prior home Tac was 2mg  in AM and 1 mg in PM. Had a supra therapuetic level. Tac on hold on 7/29, restarted at Tac 0.5 bid on 7/30 after rpt Tac trough of 5.9.     -- CTM CMP, INR   -- CTM daily Tac trough   -- No change to current Tac dose       Thank you for this consult. They patient was Discussed with  Dr. Eudelia Bunch. We will continue to follow along. Please page hepatology fellow on call with questions.     Heron Sabins, MD  Gastroenterology and Hepatology Fellow

## 2020-07-11 NOTE — Unmapped (Signed)
Patient remains intubated and on the vetniltor. Patient has a 7.5 ETT at 23cm at lips. Vent settings: PRVC 430, PEEP 16, rate 30 and FiO2 80%. Patient has none to small amount of tan thick secretions. Patient was supinated at 10am and desated to 70's but recover in few minutes. Patient SpO2 was trending down and dyssynchronous on vent. Patient was re-prone at 1300. Patient desated when attempting to swim in the evening and recover few minutes later.  Will continue to monitor.

## 2020-07-11 NOTE — Unmapped (Signed)
Pt remains prone postiion ETT secure and patent minimal secretion upon suction. Pt settings adjusted with improvement in ABG, oxygen able to be weaned overnight, NAD noted at this time.

## 2020-07-11 NOTE — Unmapped (Signed)
Continuous Renal Replacement  Dialysis Nurse Therapy Procedure Note    Treatment Type:  [facility] Number Of Days On Therapy:  0 Procedure Date:  07/10/2020 9:17 PM     TREATMENT STATUS:  Restarted  Patient and Treatment Status     None          Active Dialysis Orders (168h ago, onward)     Start     Ordered    07/09/20 1620  CRRT Orders - NxStage (Adult)  Continuous     Comments: Fluid Removal Rate parameters:  MAP   <69mmHg 10 mL/hr;  MAP   >65 mmHg 125 mL/hr;   Question Answer Comment   CRRT System: NxStage    Modality: CVVH    Access: Left Internal Jugular    BFR (mL/min): 200-350    Dialysate Flow Rate (mL/kg/hr): Other (Specify) 2.7L       07/09/20 1622              SYSTEM CHECK:  Machine Name: [id number]  Dialyzer: [id number]-505   Self Test Completed: Yes.        Alarms Connected To The Wall And Active:  No.    VITAL SIGNS:  Temp:  [35.9 ??C (96.6 ??F)-36.5 ??C (97.7 ??F)] 35.9 ??C (96.6 ??F)  Heart Rate:  [52-67] 67  SpO2 Pulse:  [52-67] 67  Resp:  [0-30] 30  SpO2:  [81 %-100 %] 95 %  A BP-2: (102-152)/(31-92) 123/54  MAP:  [53 mmHg-106 mmHg] 71 mmHg    ACCESS SITE:        Hemodialysis Catheter With Distal Infusion Port 07/09/20 Left Internal jugular 1.4 mL 1.4 mL (Active)   Site Assessment Clean;Dry;Intact 07/10/20 2040   Proximal Lumen Status / Patency Infusing 07/10/20 2040   Proximal Lumen Intervention Accessed 07/10/20 2040   Medial Lumen Status / Patency Infusing 07/10/20 2040   Medial Lumen Intervention Accessed 07/10/20 2040   Lumen 3, Distal Status / Patency Blood Return - Brisk 07/10/20 2000   Distal Lumen Intervention Flushed 07/10/20 0800   Exposed Catheter Length (cm) 0 cm 07/09/20 1700   IV Tubing and Needleless Injector Cap Change Due 07/14/20 07/10/20 2000   Dressing Type CHG gel;Occlusive 07/10/20 2040   Dressing Status      Intact/not removed 07/10/20 2040   Dressing Intervention No intervention needed 07/10/20 2040   Contraindicated due to: Dressing Intact surrounding insertion site 07/10/20 0800   Dressing Change Due 07/17/20 07/10/20 2040   Line Necessity Reviewed? Y 07/10/20 2040   Line Necessity Indications Yes - Hemodialysis 07/10/20 2040   Line Necessity Reviewed With covid mdi 07/10/20 2000          CATHETER FILL VOLUMES:     Arterial: 1.4 mL  Venous: 1.4 mL     Lab Results   Component Value Date    NA 140 07/10/2020    K 4.7 (H) 07/10/2020    CL 108 (H) 07/10/2020    CO2 21.0 07/10/2020    BUN 59 (H) 07/10/2020     Lab Results   Component Value Date    CALCIUM 8.3 (L) 07/10/2020    CAION 4.94 07/10/2020    PHOS 4.8 07/10/2020    MG 1.9 07/10/2020        SETTINGS:  Blood Pump Rate: 200 mL/min  Replacement Fluid Rate:     Pre-Blood Pump Fluid Rate:    Hourly Fluid Removal Rate: 10 mL/hr   Dialysate Fluid Rate    Therapy Fluid Temperature:  ANTICOAGULANT:  None    ADDITIONAL COMMENTS:  None    HEMODIALYSIS ON-CALL NURSE PAGER NUMBER:  ?? Monday thru Saturday 0700 - 1730: Call the Dialysis Unit ext. [phone]   ?? After 1730 and all day Sunday: Call the Dialysis RN Pager Number [phone]     PROCEDURE REVIEW, VERIFICATION, HANDOFF:  CRRT settings verified, procedure reviewed, and instructions given to primary RN.     Primary CRRT RN Verifying: Amedeo Gory, RN Dialysis RN Verifying: Manley Mason

## 2020-07-12 LAB — URINALYSIS
BILIRUBIN UA: NEGATIVE
GLUCOSE UA: 50 — AB
GRANULAR CASTS: 6 /LPF — ABNORMAL HIGH
KETONES UA: NEGATIVE
LEUKOCYTE ESTERASE UA: NEGATIVE
NITRITE UA: NEGATIVE
PH UA: 5 (ref 5.0–9.0)
RBC UA: 13 /HPF — ABNORMAL HIGH (ref <=4)
SPECIFIC GRAVITY UA: 1.02 (ref 1.003–1.030)
UROBILINOGEN UA: 0.2
WBC UA: 7 /HPF — ABNORMAL HIGH (ref 0–5)

## 2020-07-12 LAB — COMPREHENSIVE METABOLIC PANEL
ALBUMIN: 1.6 g/dL — ABNORMAL LOW (ref 3.4–5.0)
ALBUMIN: 1.7 g/dL — ABNORMAL LOW (ref 3.4–5.0)
ALBUMIN: 1.6 g/dL — ABNORMAL LOW (ref 3.4–5.0)
ALKALINE PHOSPHATASE: 65 U/L (ref 46–116)
ALKALINE PHOSPHATASE: 65 U/L (ref 46–116)
ALKALINE PHOSPHATASE: 60 U/L (ref 46–116)
ALT (SGPT): 10 U/L (ref 10–49)
ALT (SGPT): 9 U/L — ABNORMAL LOW (ref 10–49)
ALT (SGPT): <7 U/L — ABNORMAL LOW (ref 10–49)
ANION GAP: 5 mmol/L (ref 5–14)
ANION GAP: 6 mmol/L (ref 5–14)
AST (SGOT): 35 U/L — ABNORMAL HIGH (ref <=34)
AST (SGOT): 38 U/L — ABNORMAL HIGH (ref <=34)
AST (SGOT): 39 U/L — ABNORMAL HIGH (ref <=34)
BILIRUBIN TOTAL: 0.2 mg/dL — ABNORMAL LOW (ref 0.3–1.2)
BILIRUBIN TOTAL: 0.2 mg/dL — ABNORMAL LOW (ref 0.3–1.2)
BILIRUBIN TOTAL: 0.2 mg/dL — ABNORMAL LOW (ref 0.3–1.2)
BLOOD UREA NITROGEN: 32 mg/dL — ABNORMAL HIGH (ref 9–23)
BLOOD UREA NITROGEN: 28 mg/dL — ABNORMAL HIGH (ref 9–23)
BLOOD UREA NITROGEN: 24 mg/dL — ABNORMAL HIGH (ref 9–23)
BUN / CREAT RATIO: 19
BUN / CREAT RATIO: 17
CALCIUM: 8.1 mg/dL — ABNORMAL LOW (ref 8.7–10.4)
CALCIUM: 8.2 mg/dL — ABNORMAL LOW (ref 8.7–10.4)
CALCIUM: 8.2 mg/dL — ABNORMAL LOW (ref 8.7–10.4)
CHLORIDE: 107 mmol/L (ref 98–107)
CHLORIDE: 106 mmol/L (ref 98–107)
CHLORIDE: 108 mmol/L — ABNORMAL HIGH (ref 98–107)
CO2: 24 mmol/L (ref 20.0–31.0)
CO2: 24 mmol/L (ref 20.0–31.0)
CO2: 25 mmol/L (ref 20.0–31.0)
CREATININE: 1.66 mg/dL — ABNORMAL HIGH
CREATININE: 1.59 mg/dL — ABNORMAL HIGH
EGFR CKD-EPI AA FEMALE: 40 mL/min/{1.73_m2} — ABNORMAL LOW (ref >=60)
EGFR CKD-EPI AA FEMALE: 42 mL/min/{1.73_m2} — ABNORMAL LOW (ref >=60)
EGFR CKD-EPI AA FEMALE: 47 mL/min/{1.73_m2} — ABNORMAL LOW (ref >=60)
EGFR CKD-EPI NON-AA FEMALE: 35 mL/min/{1.73_m2} — ABNORMAL LOW (ref >=60)
EGFR CKD-EPI NON-AA FEMALE: 37 mL/min/{1.73_m2} — ABNORMAL LOW (ref >=60)
GLUCOSE RANDOM: 112 mg/dL (ref 70–179)
GLUCOSE RANDOM: 144 mg/dL (ref 70–179)
POTASSIUM: 4.7 mmol/L — ABNORMAL HIGH (ref 3.4–4.5)
POTASSIUM: 4.7 mmol/L — ABNORMAL HIGH (ref 3.4–4.5)
POTASSIUM: 4.8 mmol/L — ABNORMAL HIGH (ref 3.4–4.5)
PROTEIN TOTAL: 5 g/dL — ABNORMAL LOW (ref 5.7–8.2)
PROTEIN TOTAL: 5 g/dL — ABNORMAL LOW (ref 5.7–8.2)
PROTEIN TOTAL: 5.1 g/dL — ABNORMAL LOW (ref 5.7–8.2)
SODIUM: 136 mmol/L (ref 135–145)
SODIUM: 136 mmol/L (ref 135–145)
SODIUM: 139 mmol/L (ref 135–145)

## 2020-07-12 LAB — PHOSPHORUS
Phosphate:MCnc:Pt:Ser/Plas:Qn:: 2.8
Phosphate:MCnc:Pt:Ser/Plas:Qn:: 3.4
Phosphate:MCnc:Pt:Ser/Plas:Qn:: 4

## 2020-07-12 LAB — BLOOD GAS, ARTERIAL
BASE EXCESS ARTERIAL: -0.8 (ref -2.0–2.0)
O2 SATURATION ARTERIAL: 94.1 % (ref 94.0–100.0)
PH ARTERIAL: 7.33 — ABNORMAL LOW (ref 7.35–7.45)
PO2 ARTERIAL: 69.6 mmHg — ABNORMAL LOW (ref 80.0–110.0)

## 2020-07-12 LAB — BLOOD GAS CRITICAL CARE PANEL, ARTERIAL
BASE EXCESS ARTERIAL: -1.9 (ref -2.0–2.0)
BASE EXCESS ARTERIAL: -1.7 (ref -2.0–2.0)
BASE EXCESS ARTERIAL: -2.3 — ABNORMAL LOW (ref -2.0–2.0)
BASE EXCESS ARTERIAL: -0.9 (ref -2.0–2.0)
CALCIUM IONIZED ARTERIAL (MG/DL): 4.68 mg/dL (ref 4.40–5.40)
CALCIUM IONIZED ARTERIAL (MG/DL): 4.99 mg/dL (ref 4.40–5.40)
CALCIUM IONIZED ARTERIAL (MG/DL): 4.99 mg/dL (ref 4.40–5.40)
GLUCOSE WHOLE BLOOD: 114 mg/dL (ref 70–179)
GLUCOSE WHOLE BLOOD: 112 mg/dL (ref 70–179)
GLUCOSE WHOLE BLOOD: 137 mg/dL (ref 70–179)
GLUCOSE WHOLE BLOOD: 152 mg/dL (ref 70–179)
HCO3 ARTERIAL: 25 mmol/L (ref 22–27)
HCO3 ARTERIAL: 24 mmol/L (ref 22–27)
HCO3 ARTERIAL: 25 mmol/L (ref 22–27)
HEMOGLOBIN BLOOD GAS: 13.4 g/dL (ref 12.00–16.00)
HEMOGLOBIN BLOOD GAS: 10 g/dL — ABNORMAL LOW (ref 12.00–16.00)
HEMOGLOBIN BLOOD GAS: 9.3 g/dL — ABNORMAL LOW (ref 12.00–16.00)
LACTATE BLOOD ARTERIAL: 2.2 mmol/L — ABNORMAL HIGH (ref <1.3)
LACTATE BLOOD ARTERIAL: 2.4 mmol/L — ABNORMAL HIGH (ref <1.3)
LACTATE BLOOD ARTERIAL: 1.8 mmol/L — ABNORMAL HIGH (ref <1.3)
LACTATE BLOOD ARTERIAL: 2.1 mmol/L — ABNORMAL HIGH (ref <1.3)
O2 SATURATION ARTERIAL: 94.5 % (ref 94.0–100.0)
O2 SATURATION ARTERIAL: 95.1 % (ref 94.0–100.0)
O2 SATURATION ARTERIAL: 93.4 % — ABNORMAL LOW (ref 94.0–100.0)
PCO2 ARTERIAL: 49.4 mmHg — ABNORMAL HIGH (ref 35.0–45.0)
PCO2 ARTERIAL: 49.6 mmHg — ABNORMAL HIGH (ref 35.0–45.0)
PCO2 ARTERIAL: 51 mmHg — ABNORMAL HIGH (ref 35.0–45.0)
PH ARTERIAL: 7.31 — ABNORMAL LOW (ref 7.35–7.45)
PH ARTERIAL: 7.3 — ABNORMAL LOW (ref 7.35–7.45)
PH ARTERIAL: 7.3 — ABNORMAL LOW (ref 7.35–7.45)
PO2 ARTERIAL: 72.9 mmHg — ABNORMAL LOW (ref 80.0–110.0)
PO2 ARTERIAL: 67.1 mmHg — ABNORMAL LOW (ref 80.0–110.0)
POTASSIUM WHOLE BLOOD: 4.6 mmol/L (ref 3.4–4.6)
POTASSIUM WHOLE BLOOD: 4.6 mmol/L (ref 3.4–4.6)
POTASSIUM WHOLE BLOOD: 4.6 mmol/L (ref 3.4–4.6)
POTASSIUM WHOLE BLOOD: 4.7 mmol/L — ABNORMAL HIGH (ref 3.4–4.6)
SODIUM WHOLE BLOOD: 139 mmol/L (ref 135–145)
SODIUM WHOLE BLOOD: 141 mmol/L (ref 135–145)
SODIUM WHOLE BLOOD: 136 mmol/L (ref 135–145)
SODIUM WHOLE BLOOD: 138 mmol/L (ref 135–145)

## 2020-07-12 LAB — CBC
HEMATOCRIT: 32.2 % — ABNORMAL LOW (ref 36.0–46.0)
HEMATOCRIT: 29.6 % — ABNORMAL LOW (ref 36.0–46.0)
MEAN CORPUSCULAR HEMOGLOBIN CONC: 32.1 g/dL (ref 31.0–37.0)
MEAN CORPUSCULAR HEMOGLOBIN CONC: 31.5 g/dL (ref 31.0–37.0)
MEAN CORPUSCULAR HEMOGLOBIN: 29.3 pg (ref 26.0–34.0)
MEAN CORPUSCULAR HEMOGLOBIN: 29 pg (ref 26.0–34.0)
MEAN PLATELET VOLUME: 8.4 fL (ref 7.0–10.0)
MEAN PLATELET VOLUME: 8.5 fL (ref 7.0–10.0)
PLATELET COUNT: 240 10*9/L (ref 150–440)
PLATELET COUNT: 153 10*9/L (ref 150–440)
RED BLOOD CELL COUNT: 3.52 10*12/L — ABNORMAL LOW (ref 4.00–5.20)
RED BLOOD CELL COUNT: 3.21 10*12/L — ABNORMAL LOW (ref 4.00–5.20)
RED CELL DISTRIBUTION WIDTH: 15.1 % — ABNORMAL HIGH (ref 12.0–15.0)
RED CELL DISTRIBUTION WIDTH: 14.9 % (ref 12.0–15.0)
WBC ADJUSTED: 19.4 10*9/L — ABNORMAL HIGH (ref 4.5–11.0)

## 2020-07-12 LAB — CBC W/ AUTO DIFF
BASOPHILS RELATIVE PERCENT: 0.4 %
EOSINOPHILS ABSOLUTE COUNT: 0.5 10*9/L — ABNORMAL HIGH (ref 0.0–0.4)
EOSINOPHILS RELATIVE PERCENT: 1.7 %
HEMATOCRIT: 33.1 % — ABNORMAL LOW (ref 36.0–46.0)
HEMOGLOBIN: 10.4 g/dL — ABNORMAL LOW (ref 12.0–16.0)
LYMPHOCYTES ABSOLUTE COUNT: 0.9 10*9/L — ABNORMAL LOW (ref 1.5–5.0)
LYMPHOCYTES RELATIVE PERCENT: 3.4 %
MEAN CORPUSCULAR HEMOGLOBIN CONC: 31.5 g/dL (ref 31.0–37.0)
MEAN CORPUSCULAR HEMOGLOBIN: 28.9 pg (ref 26.0–34.0)
MEAN PLATELET VOLUME: 8.5 fL (ref 7.0–10.0)
MONOCYTES ABSOLUTE COUNT: 0.4 10*9/L (ref 0.2–0.8)
MONOCYTES RELATIVE PERCENT: 1.4 %
NEUTROPHILS ABSOLUTE COUNT: 25 10*9/L — ABNORMAL HIGH (ref 2.0–7.5)
NEUTROPHILS RELATIVE PERCENT: 92.6 %
PLATELET COUNT: 247 10*9/L (ref 150–440)
RED BLOOD CELL COUNT: 3.62 10*12/L — ABNORMAL LOW (ref 4.00–5.20)
RED CELL DISTRIBUTION WIDTH: 15 % (ref 12.0–15.0)
WBC ADJUSTED: 27 10*9/L — ABNORMAL HIGH (ref 4.5–11.0)

## 2020-07-12 LAB — SPECIMEN SOURCE

## 2020-07-12 LAB — PCO2 ARTERIAL: Carbon dioxide:PPres:Pt:BldA:Qn:: 49.6 — ABNORMAL HIGH

## 2020-07-12 LAB — INR: Coagulation tissue factor induced.INR:RelTime:Pt:PPP:Qn:Coag: 1.13

## 2020-07-12 LAB — CHLORIDE
Chloride:SCnc:Pt:Ser/Plas:Qn:: 106
Chloride:SCnc:Pt:Ser/Plas:Qn:: 107

## 2020-07-12 LAB — RED BLOOD CELL COUNT: Lab: 3.52 — ABNORMAL LOW

## 2020-07-12 LAB — PH UA: pH:LsCnc:Pt:Urine:Qn:Test strip: 5

## 2020-07-12 LAB — MAGNESIUM
Magnesium:MCnc:Pt:Ser/Plas:Qn:: 1.6
Magnesium:MCnc:Pt:Ser/Plas:Qn:: 1.7
Magnesium:MCnc:Pt:Ser/Plas:Qn:: 1.7

## 2020-07-12 LAB — APTT
APTT: 27.3 s (ref 24.9–36.9)
Coagulation surface induced:Time:Pt:PPP:Qn:Coag: 27.3
Coagulation surface induced:Time:Pt:PPP:Qn:Coag: 29

## 2020-07-12 LAB — TACROLIMUS, TROUGH: Lab: 2.1 — ABNORMAL LOW

## 2020-07-12 LAB — POTASSIUM: Potassium:SCnc:Pt:Ser/Plas:Qn:: 4.8 — ABNORMAL HIGH

## 2020-07-12 LAB — D-DIMER QUANTITATIVE (CW,ML,HL): Lab: 3656 — ABNORMAL HIGH

## 2020-07-12 LAB — LACTATE BLOOD ARTERIAL: Lactate:SCnc:Pt:BldA:Qn:: 2.4 — ABNORMAL HIGH

## 2020-07-12 LAB — MEAN CORPUSCULAR HEMOGLOBIN CONC: Erythrocyte mean corpuscular hemoglobin concentration:MCnc:Pt:RBC:Qn:Automated count: 31.5

## 2020-07-12 LAB — SODIUM WHOLE BLOOD: Sodium:SCnc:Pt:Bld:Qn:: 139

## 2020-07-12 LAB — NEUTROPHILS RELATIVE PERCENT: Neutrophils/100 leukocytes:NFr:Pt:Bld:Qn:Automated count: 92.6

## 2020-07-12 MED ADMIN — midazolam (VERSED) injection 10 mg: 10 mg | INTRAVENOUS | @ 12:00:00

## 2020-07-12 MED ADMIN — levothyroxine (SYNTHROID) tablet 100 mcg: 100 ug | GASTROENTERAL | @ 09:00:00

## 2020-07-12 MED ADMIN — LORazepam (ATIVAN) tablet 8 mg: 8 mg | GASTROENTERAL | @ 08:00:00

## 2020-07-12 MED ADMIN — midazolam in sodium chloride 0.9% (1 mg/mL) infusion PMB: 0-12 mg/h | INTRAVENOUS | @ 06:00:00

## 2020-07-12 MED ADMIN — midazolam (VERSED) injection 10 mg: 10 mg | INTRAVENOUS | @ 16:00:00

## 2020-07-12 MED ADMIN — midazolam in sodium chloride 0.9% (1 mg/mL) infusion PMB: 0-12 mg/h | INTRAVENOUS

## 2020-07-12 MED ADMIN — oxyCODONE (ROXICODONE) immediate release tablet 60 mg: 60 mg | GASTROENTERAL | @ 13:00:00 | Stop: 2020-07-17

## 2020-07-12 MED ADMIN — vasopressin infusion 40 units/50 mL (0.8 units/mL) in NS: .03 [IU]/min | INTRAVENOUS | @ 01:00:00

## 2020-07-12 MED ADMIN — midazolam (VERSED) injection 10 mg: 10 mg | INTRAVENOUS | @ 18:00:00

## 2020-07-12 MED ADMIN — NxStage RFP 401 (+/- BB) 5000 mL - contains 4 mEq/L of potassium dialysis solution 5,000 mL: 5000 mL | INTRAVENOUS_CENTRAL | @ 05:00:00

## 2020-07-12 MED ADMIN — metoclopramide (REGLAN) injection 10 mg: 10 mg | INTRAVENOUS | @ 15:00:00

## 2020-07-12 MED ADMIN — oxyCODONE (ROXICODONE) immediate release tablet 60 mg: 60 mg | GASTROENTERAL | @ 18:00:00 | Stop: 2020-07-17

## 2020-07-12 MED ADMIN — heparin (porcine) 7,500 units/0.75 mL syringe: 7500 [IU] | SUBCUTANEOUS | @ 09:00:00

## 2020-07-12 MED ADMIN — vasopressin (PITRESSIN) 40 units/50 mL (0.08 unit/mL) infusion: INTRAVENOUS | @ 01:00:00 | Stop: 2020-07-11

## 2020-07-12 MED ADMIN — NxStage RFP 401 (+/- BB) 5000 mL - contains 4 mEq/L of potassium dialysis solution 5,000 mL: 5000 mL | INTRAVENOUS_CENTRAL | @ 10:00:00

## 2020-07-12 MED ADMIN — insulin NPH (HumuLIN,NovoLIN) injection 10 Units: 10 [IU] | SUBCUTANEOUS | @ 22:00:00

## 2020-07-12 MED ADMIN — tacrolimus (PROGRAF) oral suspension: .5 mg | GASTROENTERAL

## 2020-07-12 MED ADMIN — cellulose, oxidized reg 4"X 8" pad: TOPICAL | @ 09:00:00 | Stop: 2020-07-12

## 2020-07-12 MED ADMIN — NxStage RFP 401 (+/- BB) 5000 mL - contains 4 mEq/L of potassium dialysis solution 5,000 mL: 5000 mL | INTRAVENOUS_CENTRAL | @ 13:00:00

## 2020-07-12 MED ADMIN — aspirin chewable tablet 81 mg: 81 mg | GASTROENTERAL | @ 13:00:00

## 2020-07-12 MED ADMIN — oxyCODONE (ROXICODONE) immediate release tablet 60 mg: 60 mg | GASTROENTERAL | @ 02:00:00 | Stop: 2020-07-17

## 2020-07-12 MED ADMIN — insulin regular (HumuLIN,NovoLIN) injection 0-12 Units: 0-12 [IU] | SUBCUTANEOUS | @ 04:00:00

## 2020-07-12 MED ADMIN — polyethylene glycol (MIRALAX) packet 17 g: 17 g | GASTROENTERAL | @ 18:00:00

## 2020-07-12 MED ADMIN — HYDROmorphone (PF) (DILAUDID) injection 8 mg: 8 mg | INTRAVENOUS | @ 12:00:00

## 2020-07-12 MED ADMIN — heparin (porcine) 7,500 units/0.75 mL syringe: 7500 [IU] | SUBCUTANEOUS | @ 18:00:00

## 2020-07-12 MED ADMIN — oxyCODONE (ROXICODONE) immediate release tablet 60 mg: 60 mg | GASTROENTERAL | @ 09:00:00 | Stop: 2020-07-17

## 2020-07-12 MED ADMIN — levETIRAcetam (KEPPRA) tablet 500 mg: 500 mg | GASTROENTERAL | @ 13:00:00

## 2020-07-12 MED ADMIN — QUEtiapine (SEROquel) tablet 50 mg: 50 mg | GASTROENTERAL | @ 02:00:00

## 2020-07-12 MED ADMIN — NxStage RFP 401 (+/- BB) 5000 mL - contains 4 mEq/L of potassium dialysis solution 5,000 mL: 5000 mL | INTRAVENOUS_CENTRAL | @ 22:00:00

## 2020-07-12 MED ADMIN — alteplase (ACTIVASE) injection small catheter clearance 2 mg: 2 mg | @ 03:00:00 | Stop: 2020-07-11

## 2020-07-12 MED ADMIN — cefepime (MAXIPIME) 2 g in dextrose 100 mL IVPB (premix): 2 g | INTRAVENOUS | @ 15:00:00 | Stop: 2020-07-17

## 2020-07-12 MED ADMIN — QUEtiapine (SEROquel) tablet 50 mg: 50 mg | GASTROENTERAL | @ 13:00:00

## 2020-07-12 MED ADMIN — midazolam (VERSED) injection 10 mg: 10 mg | INTRAVENOUS | @ 06:00:00

## 2020-07-12 MED ADMIN — HYDROmorphone 1 mg/mL in 0.9% sodium chloride: 0-12 mg/h | INTRAVENOUS | @ 03:00:00 | Stop: 2020-07-16

## 2020-07-12 MED ADMIN — chlorhexidine (PERIDEX) 0.12 % solution 5 mL: 5 mL | OROMUCOSAL | @ 11:00:00

## 2020-07-12 MED ADMIN — oxyCODONE (ROXICODONE) immediate release tablet 60 mg: 60 mg | GASTROENTERAL | @ 07:00:00 | Stop: 2020-07-17

## 2020-07-12 MED ADMIN — oxyCODONE (ROXICODONE) immediate release tablet 60 mg: 60 mg | GASTROENTERAL | @ 22:00:00 | Stop: 2020-07-17

## 2020-07-12 MED ADMIN — LORazepam (ATIVAN) tablet 8 mg: 8 mg | GASTROENTERAL | @ 15:00:00

## 2020-07-12 MED ADMIN — LORazepam (ATIVAN) tablet 8 mg: 8 mg | GASTROENTERAL | @ 19:00:00

## 2020-07-12 MED ADMIN — HYDROmorphone 1 mg/mL in 0.9% sodium chloride: 0-12 mg/h | INTRAVENOUS | @ 12:00:00 | Stop: 2020-07-16

## 2020-07-12 MED ADMIN — insulin regular (HumuLIN,NovoLIN) injection 0-12 Units: 0-12 [IU] | SUBCUTANEOUS | @ 22:00:00

## 2020-07-12 MED ADMIN — chlorhexidine (PERIDEX) 0.12 % solution 5 mL: 5 mL | OROMUCOSAL

## 2020-07-12 MED ADMIN — HYDROmorphone 1 mg/mL in 0.9% sodium chloride: 0-12 mg/h | INTRAVENOUS | @ 06:00:00 | Stop: 2020-07-16

## 2020-07-12 MED ADMIN — HYDROmorphone (PF) (DILAUDID) injection 8 mg: 8 mg | INTRAVENOUS | @ 06:00:00

## 2020-07-12 MED ADMIN — buPROPion (WELLBUTRIN) tablet 100 mg: 100 mg | GASTROENTERAL | @ 02:00:00

## 2020-07-12 MED ADMIN — LORazepam (ATIVAN) tablet 8 mg: 8 mg | GASTROENTERAL | @ 13:00:00

## 2020-07-12 MED ADMIN — HYDROmorphone 1 mg/mL in 0.9% sodium chloride: 0-12 mg/h | INTRAVENOUS | @ 16:00:00 | Stop: 2020-07-16

## 2020-07-12 MED ADMIN — polyethylene glycol (MIRALAX) packet 17 g: 17 g | GASTROENTERAL | @ 13:00:00

## 2020-07-12 MED ADMIN — pravastatin (PRAVACHOL) tablet 40 mg: 40 mg | GASTROENTERAL | @ 13:00:00

## 2020-07-12 MED ADMIN — CISATRacurium (NIMBEX) 200 mg/100 mL (2 mg/mL) continuous infusion: 0-10 ug/kg/min | INTRAVENOUS | @ 18:00:00

## 2020-07-12 MED ADMIN — buPROPion (WELLBUTRIN) tablet 100 mg: 100 mg | GASTROENTERAL | @ 13:00:00

## 2020-07-12 MED ADMIN — midazolam in sodium chloride 0.9% (1 mg/mL) infusion PMB: 0-12 mg/h | INTRAVENOUS | @ 16:00:00

## 2020-07-12 MED ADMIN — HYDROmorphone 1 mg/mL in 0.9% sodium chloride: 0-12 mg/h | INTRAVENOUS | @ 21:00:00 | Stop: 2020-07-16

## 2020-07-12 MED ADMIN — LORazepam (ATIVAN) tablet 8 mg: 8 mg | GASTROENTERAL | @ 01:00:00

## 2020-07-12 MED ADMIN — buPROPion (WELLBUTRIN) tablet 100 mg: 100 mg | GASTROENTERAL | @ 18:00:00

## 2020-07-12 NOTE — Unmapped (Addendum)
Tacrolimus Therapeutic Monitoring Pharmacy Note    Evelyn Rollins is a 54 y.o. female continuing tacrolimus.     Indication: Liver transplant     Date of Transplant: 06/2015      Prior Dosing Information: Current regimen 0.5 mg BID      Goals:  Therapeutic Drug Levels  Tacrolimus trough goal: 3-5 ng/mL    Additional Clinical Monitoring/Outcomes  ?? Monitor renal function (SCr and urine output) and liver function (LFTs)  ?? Monitor for signs/symptoms of adverse events (e.g., hyperglycemia, hyperkalemia, hypomagnesemia, hypertension, headache, tremor)    Results:   Tacrolimus level: 2.1 ng/mL, drawn appropriately    Pharmacokinetic Considerations and Significant Drug Interactions:  ??? Concurrent hepatotoxic medications: None identified  ??? Concurrent CYP3A4 substrates/inhibitors: None identified  ??? Concurrent nephrotoxic medications: None identified    Assessment/Plan:  Recommendedation(s)  ??? Increase to tacrolimus 1 mg BID    Follow-up  ??? Next level has been ordered on 07/13/20 at 0800.   ??? A pharmacist will continue to monitor and recommend levels as appropriate    Please page service pharmacist with questions/clarifications.    Desma Maxim, PharmD

## 2020-07-12 NOTE — Unmapped (Signed)
Hypothermic, unable to obtain temperature, Bair Hugger applied, 36.3 on last assessment. NSR, becoming more tachycardic. MAP maintained > 65, required norepinephrine to be started this shift up to 10 mcg. Supinated at 1015, reproned urgently this afternoon at 1430, SpO2 low 70s, PRN Versed and Dilaudid given, 10mg  Vec given. SpO2 improved once prone, unable to tolerating swimming. Additional dose vecuronium given this shift. TF at goal while supine, 30ml while prone. Diminishing UO. CRRT on for majority of shift, pulling 145ml/hr per orders, clotted at 1615, dialysis RN notified. Remains on cisatracurium, versed, hydromorphone, norepinephrine. Daughter updated.     Problem: Adult Inpatient Plan of Care  Goal: Plan of Care Review  Outcome: Not Progressing  Goal: Patient-Specific Goal (Individualization)  Outcome: Not Progressing  Goal: Absence of Hospital-Acquired Illness or Injury  Outcome: Not Progressing  Goal: Optimal Comfort and Wellbeing  Outcome: Not Progressing  Goal: Readiness for Transition of Care  Outcome: Not Progressing  Goal: Rounds/Family Conference  Outcome: Not Progressing     Problem: Skin Injury Risk Increased  Goal: Skin Health and Integrity  Outcome: Not Progressing     Problem: Infection  Goal: Infection Symptom Resolution  Outcome: Not Progressing     Problem: Fall Injury Risk  Goal: Absence of Fall and Fall-Related Injury  Outcome: Not Progressing     Problem: Wound  Goal: Optimal Wound Healing  Outcome: Not Progressing     Problem: Communication Impairment (Mechanical Ventilation, Invasive)  Goal: Effective Communication  Outcome: Not Progressing     Problem: Device-Related Complication Risk (Mechanical Ventilation, Invasive)  Goal: Optimal Device Function  Outcome: Not Progressing     Problem: Inability to Wean (Mechanical Ventilation, Invasive)  Goal: Mechanical Ventilation Liberation  Outcome: Not Progressing     Problem: Skin and Tissue Injury (Mechanical Ventilation, Invasive)  Goal: Absence of Device-Related Skin and Tissue Injury  Outcome: Not Progressing     Problem: Ventilator-Induced Lung Injury (Mechanical Ventilation, Invasive)  Goal: Absence of Ventilator-Induced Lung Injury  Outcome: Not Progressing     Problem: Self-Care Deficit  Goal: Improved Ability to Complete Activities of Daily Living  Outcome: Not Progressing     Problem: Asthma Comorbidity  Goal: Maintenance of Asthma Control  Outcome: Not Progressing     Problem: COPD Comorbidity  Goal: Maintenance of COPD Symptom Control  Outcome: Not Progressing     Problem: Diabetes Comorbidity  Goal: Blood Glucose Level Within Desired Range  Outcome: Not Progressing     Problem: Heart Failure Comorbidity  Goal: Maintenance of Heart Failure Symptom Control  Outcome: Not Progressing     Problem: Hypertension Comorbidity  Goal: Blood Pressure in Desired Range  Outcome: Not Progressing     Problem: Obstructive Sleep Apnea Risk or Actual (Comorbidity Management)  Goal: Unobstructed Breathing During Sleep  Outcome: Not Progressing     Problem: Pain Chronic (Persistent) (Comorbidity Management)  Goal: Acceptable Pain Control and Functional Ability  Outcome: Not Progressing     Problem: Seizure Disorder Comorbidity  Goal: Maintenance of Seizure Control  Outcome: Not Progressing     Problem: LTC COVID-19 Confirmed or Rule-Out  Goal: Patient/Resident will remain free of complications due to COVID-19  Description: 1. Review and update the patient/resident's isolation status in the Isolation activity  2. Keep the patient/resident's door closed at all times and limit movement of the patient/resident outside of the room to medically essential purposes. If applicable, transfer patient/resident to a single-person room   3. Educate and reinforce infection prevention and control practices recommended by CDC  4. Frequently monitor for development of more severe symptoms   5. Use appropriate PPE when providing care for patient/resident  6. Reinforce no visitor policy and non-essential health care personnel policy, except for certain compassionate care situations  7. If worsening of symptoms occur, alert the nearest Hospital caring for confirmed COVID-19 patients and arrange for transfer with proper precautions including placing a facemask on the patient/resident during transfer  8. Communicate information about known or suspected case of COVID-19 to appropriate public health personnel  9. Avoid procedures that are likely to induce coughing (e.g., sputum induction, open suctioning of airways). If required, do so in an Airborne Infection Isolation Room. The health care provider in the room should wear an N95 or higher-level respirator, eye protection, gloves, and a gown. The number of HCP present during the procedure should be limited to only those essential for patient/resident care and procedure support. Visitors should not be present for the procedure. Clean and disinfect procedure room surfaces promptly  10. Update patient/resident and family/representatives as needed      Outcome: Not Progressing     Problem: Gas Exchange Impaired  Goal: Optimal Gas Exchange  Outcome: Not Progressing     Problem: Electrolyte Imbalance (Acute Kidney Injury/Impairment)  Goal: Serum Electrolyte Balance  Outcome: Not Progressing     Problem: Fluid Imbalance (Acute Kidney Injury/Impairment)  Goal: Optimal Fluid Balance  Outcome: Not Progressing     Problem: Hematologic Alteration (Acute Kidney Injury/Impairment)  Goal: Hemoglobin, Hematocrit and Platelets Within Normal Range  Outcome: Not Progressing     Problem: Oral Intake Inadequate (Acute Kidney Injury/Impairment)  Goal: Optimal Nutrition Intake  Outcome: Not Progressing     Problem: Renal Function Impairment (Acute Kidney Injury/Impairment)  Goal: Effective Renal Function  Outcome: Not Progressing

## 2020-07-12 NOTE — Unmapped (Signed)
Continuous Renal Replacement  Dialysis Nurse Therapy Procedure Note    Treatment Type:  [facility] Number Of Days On Therapy:  0 Procedure Date:  07/12/2020 3:06 AM     TREATMENT STATUS:  Restarted  Patient and Treatment Status     None          Active Dialysis Orders (168h ago, onward)     Start     Ordered    07/09/20 1620  CRRT Orders - NxStage (Adult)  Continuous     Comments: Fluid Removal Rate parameters:  MAP   <71mmHg 10 mL/hr;  MAP   >65 mmHg 125 mL/hr;   Question Answer Comment   CRRT System: NxStage    Modality: CVVH    Access: Left Internal Jugular    BFR (mL/min): 200-350    Dialysate Flow Rate (mL/kg/hr): Other (Specify) 2.7L       07/09/20 1622              SYSTEM CHECK:  Machine Name: [id number]  Dialyzer: CAR-505   Self Test Completed: Yes.        Alarms Connected To The Wall And Active:  No.    VITAL SIGNS:  Temp:  [36.3 ??C (97.3 ??F)] 36.3 ??C (97.3 ??F)  Heart Rate:  [57-106] 104  SpO2 Pulse:  [57-105] 105  Resp:  [28-33] 33  SpO2:  [75 %-100 %] 91 %  A BP-2: (96-155)/(42-71) 115/53  MAP:  [56 mmHg-93 mmHg] 69 mmHg    ACCESS SITE:        Hemodialysis Catheter With Distal Infusion Port 07/09/20 Left Internal jugular 1.4 mL 1.4 mL (Active)   Site Assessment Not assessed;Clean;Dry;Intact 07/12/20 0000   Proximal Lumen Status / Patency Saline locked 07/12/20 0000   Proximal Lumen Intervention Clamped 07/12/20 0000   Medial Lumen Status / Patency Capped;Other (Comment) 07/12/20 0000   Medial Lumen Intervention Clamped 07/12/20 0000   Lumen 3, Distal Status / Patency Saline locked 07/12/20 0000   Distal Lumen Intervention Flushed 07/12/20 0000   Exposed Catheter Length (cm) 0 cm 07/09/20 1700   IV Tubing and Needleless Injector Cap Change Due 07/14/20 07/12/20 0000   Dressing Type Transparent;Occlusive;CHG gel 07/12/20 0000   Dressing Status      Clean;Dry;Intact/not removed 07/12/20 0000   Dressing Intervention No intervention needed 07/12/20 0000   Contraindicated due to: Dressing Intact surrounding insertion site 07/12/20 0000   Dressing Change Due 07/18/20 07/12/20 0000   Line Necessity Reviewed? Y 07/12/20 0000   Line Necessity Indications Yes - Hemodialysis 07/12/20 0000   Line Necessity Reviewed With covid mdi 07/12/20 0000          CATHETER FILL VOLUMES:     Arterial: 0 mL  Venous: 0 mL     Lab Results   Component Value Date    NA 139 07/11/2020    K 4.9 (H) 07/11/2020    CL 106 07/11/2020    CO2 25.0 07/11/2020    BUN 38 (H) 07/11/2020     Lab Results   Component Value Date    CALCIUM 8.1 (L) 07/11/2020    CAION 4.88 07/11/2020    PHOS 4.0 07/11/2020    MG 1.7 07/11/2020        SETTINGS:  Blood Pump Rate: 300 mL/min  Replacement Fluid Rate:     Pre-Blood Pump Fluid Rate:    Hourly Fluid Removal Rate: 10 mL/hr   Dialysate Fluid Rate    Therapy Fluid Temperature:  ANTICOAGULANT:  None    ADDITIONAL COMMENTS:  None    HEMODIALYSIS ON-CALL NURSE PAGER NUMBER:  ?? Monday thru Saturday 0700 - 1730: Call the Dialysis Unit ext. [phone]   ?? After 1730 and all day Sunday: Call the Dialysis RN Pager Number [phone]     PROCEDURE REVIEW, VERIFICATION, HANDOFF:  CRRT settings verified, procedure reviewed, and instructions given to primary RN.     Primary CRRT RN Verifying: AAmodei RN Dialysis RN Verifying: Alonna Minium

## 2020-07-12 NOTE — Unmapped (Signed)
[facility] Liaison Consult    [facility] Liaison Nurse was consulted for bleeding insertion site of trialysis line. Surgicel placed under sterile conditions. Moderate amount of bleeding at the site. Dilation hole looked appropriate for catheter. Dr. Stasia Cavalier added additional sutures to secure line. Mastisol added to border and Tegaderm added to reinforce dressing. Surgicel is good for 48 hours.     Thank you for this consult,  Mauri Reading RN    Consult Time 60 minutes (min)

## 2020-07-12 NOTE — Unmapped (Signed)
MICU Daily Progress Note     Date of Service: 07/12/2020    Problem List:   Principal Problem:    Acute hypoxemic respiratory failure due to severe acute respiratory syndrome coronavirus 2 (SARS-CoV-2) disease (CMS-HCC)  Active Problems:    Liver transplant recipient (CMS-HCC)    Morbid obesity with BMI of 40.0-44.9, adult (CMS-HCC)    Acute renal failure (ARF) (CMS-HCC)  Resolved Problems:    * No resolved hospital problems. *      Interval history:  Evelyn Rollins is a 54 y.o. female with PMH cirrhosis status post liver transplant 2016 on chronic immunosuppressive therapy, hypertension, diabetes, hypothyroidism who presented to [facility] on 7/22 with myalgias, sore throat, loss of taste and fevers  x5 days. When her symptoms had started, she took a home Covid test on 7/17 which was found to be positive. She has had poor p.o. intake for the last several days. Her husband also has similar symptoms. In the OSH ED she was found to be severely hypoxic with O2 saturations of 50% on room air. She was placed on a nonrebreather and current saturations increased to the low 90s. Chest x-ray shows bilateral infiltrates consistent with COVID-19 pneumonia. Her Covid test is positive. Inflammatory markers are elevated. She received a dose of Decadron in the emergency room.    24hr events:   -hypotension overnight , vasopressin started  -oozing at HD catheter insertion site--> stitch added and surgicel applied to achieve hemostasis  -remained prone overnight  -WBC rising    Neurological   Analgesia and Sedation  -continue dilaudid and versed gtt  -cis gtt (7/31 -   -goal RASS 3- to 4-  -PRN dilaudid and versed  -ativan 8q4h, oxy 60q4h  ??  Anxiety, depression  -restarted home meds Wellbutrin/Lexapro, and Seroquel in place of abilify  ??  Seizure history (not taking AEDs), ?seizure activity  -neurology consult >> c/f RUE weakness, MRI 7/27- no acute pathology  -vEEG negative for seizure x 48hrs >> discont monitoring per neuro 7/25  - MRI with possible sz focus, starting keppra 500 mg daily per neuro recommendations       Pulmonary   ARDS, Acute resp failure r/t COVID, pnuemomediastinum   - cont lung protective ventilation strategies (70ml/kg, permissive hypercapnea)   -proning with supination trials daily, only lasts 4 hrs  -trial of supination today, likely will need re-proning for P/F ratio < 100   -now with probable VAP, abx started    Cardiovascular   h/o HTN   - holding home antihtn  - previously on levo for MAP >65  - last echo 2016 w nml LVEF, enlrgd RV and deprssed RV fxn w mild phtn  -continue statin     Hypotension  -continue levophed for MAP goal >65    Renal   ARF on CRRT:  - cont foley for accurate I/O during critical illness   - replete electrolytes prn and monitor daily  - nephrology following  - CRRT goal UF 150  - goal - 1L    Infectious Disease/Autoimmune   COVID 19 infection  Symptom onset:??7/17  Exposure: unknown  COVID PCR+: 7/17  OSH Admission:??7/22  Day of admission/transfer: 7/22  OSH Meds Given:??dexamethasone  COVID Specific??Meds:??dexamethasone 7/22, remdesivir 7/22  Vaccination status: not vaccinated  ??  Monitoring:  - Special airborne/contact precautions (If unavailable, droplet & contact precautions)  - f/u daily CMP, CBC w/ diff, CRP, D-dimer, DIC, troponin, pro-BNP with weekly ferritin, LDH and cytokine level  -  f/u q6h lactate; ABG  ??  Medications:  - completed remdesivir  - cont decadron 6mg  po  daily x 10d, started 7/22  ??  CAP/VAP: MSSA   - febrile, leukocytosis   - started cefep/vanc 7/25--> changed to ancef on 7/27 x7 days  - pan cx for T>38.2   - wbc count rising, hypoxia worsening, broad spectrum abx (vanc/cefe) started 8/1     1/2 staph epi bactermia likely contaminant:  -repeat blood cultures neg       Cultures:  Blood Culture, Routine (no units)   Date Value   07/06/2020 No Growth at 5 days     Lower Respiratory Culture (no units)   Date Value   07/04/2020 3+ Methicillin-Susceptible Staphylococcus aureus (A)     WBC (10*9/L)   Date Value   07/12/2020 27.0 (H)     WBC, UA (/HPF)   Date Value   07/12/2020 7 (H)          FEN/GI   -trickle tube feeds > advance to goal  -reglan  -bowel regimen w senna and miralax  -PPI  ??     Malnutrition Assessment: Not done yet.  Immunocompromised s/p Liver transplant in 2016  -restarted tacro 0.5 mg q12  - f/u tacro level  -HOLD cellcept    Heme/Coag   Hypercoagulable state 2/2 covid  -anticoagulation group C heparin  -LE dopplers neg for DVT 7/24  -no evid bleeding    Endocrine   History of DM2  -insulin gtt stopped  -ISS  -NPH 10 bid    Hypothyroidism  -continue synthroid  ??  Integumentary   - WOCN consulted for high risk skin assessment Yes.  - WOCN recs >> pending, ordered 7/25   - cont pressure mitigating precautions per skin policy    Prophylaxis/LDA/Restraints/Consults   Can CVC be removed? No: need for medications requiring central access (e.g. pressors)   Can A-line be removed? No: frequent ABGs  Can Foley be removed? No: Need continuous I/O  Mobility plan: Step 1 - Range of motion    Feeding: Trickle feeds, advance as tolerated  Analgesia: No pain issues  Sedation SAT/SBT: No PEEP > 8  Thromboembolic ppx: Enoxaparin  Head of bed >30 degrees: Yes  Ulcer ppx: Yes, coagulopathy  Glucose within target range: Yes, in range    Does patient need/have an active type/screen? NA    RASS at goal? Yes  [place] Agitation Assessment Scale (RASS) : -5 (07/12/2020  8:00 AM)     Can antipsychotics be stopped? N/A, not on antipsychotics  CAM-ICU Result: Positive (07/12/2020  8:00 AM)      Would hospice care be appropriate for this patient? No, patient improving or expected to improve    Patient Lines/Drains/Airways Status    Active Active Lines, Drains, & Airways     Name:   Placement date:   Placement time:   Site:   Days:    ETT  7.5   Jul 20, 2020    1700     9    CVC Triple Lumen 07/12/2020 Non-tunneled Right Internal jugular   Jul 20, 2020    1945    Internal jugular   9    Hemodialysis Catheter With Distal Infusion Port 07/09/20 Left Internal jugular 1.4 mL 1.4 mL   07/09/20    1700    Internal jugular   2    NG/OG Tube Decompression;Feedings Center mouth   07/03/20    0610    Center mouth   9  Urethral Catheter   Jul 19, 2020    ???    ???   10    Arterial Line 19-Jul-2020 Right Radial   2020/07/19    2315    Radial   9              Patient Lines/Drains/Airways Status    Active Wounds     Name:   Placement date:   Placement time:   Site:   Days:    Wound 07/11/20 Pressure Injury Face Left DTI   07/11/20    1100    Face   less than 1                Goals of Care     Code Status: Full Code    Designated Healthcare Decision Maker:  Ms. Wasilewski current decisional capacity for healthcare decision-making is incapacitated. Her designated Educational psychologist) is/are her husband Vernia Buff .      Subjective     Intubated, sedated, prone    Objective     Vitals - past 24 hours  Temp:  [36.3 ??C (97.3 ??F)-36.8 ??C (98.2 ??F)] 36.8 ??C (98.2 ??F)  Heart Rate:  [58-106] 85  SpO2 Pulse:  [58-105] 85  Resp:  [28-33] 33  FiO2 (%):  [75 %-100 %] 80 %  SpO2:  [75 %-100 %] 97 % Intake/Output  I/O last 3 completed shifts:  In: 2034.5 [I.V.:939.5; NG/GT:1095]  Out: 2605 [Urine:360; Emesis/NG output:120; Other:1305; Stool:820]     ??  Physical Exam:   General: NAD, well nourished, obese, intubated, sedated   HEENT:  normocephalic, trachea mdl, anicteric, no scleral edema, no conjuctival erythema/drainage, MMM and pink   CV: RRR. S1 and S2 normal, no m/r/c/g. no bruit, or JVD. DP Pulses 2  equal. 1+ BLE edema.  Lungs:   chest rise symm, diminished bases. CTA bilat, no wheezes/crackles/rhonchi. Good air movement.  Skin:   w/d/i, no jaundice present, no rashes, lesions, petechiae or breakdown. no drng, erythema.    Abd: contour, abdomen soft, non-tender and not distended. Normoactive bowel sounds x 4 quads, no rebound tenderness or guarding.   Ext: No cyanosis, bruising, clubbing or edema.   Neuro: perrl 3Sl, mdl, orbital edema, +c/c/g,     Continuous Infusions:   ??? CISATRacurium 4 mcg/kg/min (07/12/20 0100)   ??? HYDROmorphone 11 mg/hr (07/12/20 0758)   ??? midazolam (1 mg/mL) infusion 12 mg/hr (07/12/20 0140)   ??? norepinephrine bitartrate-NS 6 mcg/min (07/12/20 0800)   ??? NxStage RFP 400 (+/- BB) 5000 mL - contains 2 mEq/L of potassium     ??? NxStage RFP 401 (+/- BB) 5000 mL - contains 4 mEq/L of potassium     ??? vasopressin Stopped (07/12/20 0900)       Scheduled Medications:   ??? aspirin  81 mg Enteral tube: gastric  Daily   ??? buPROPion  100 mg Enteral tube: gastric  TID   ??? chlorhexidine  5 mL Mouth BID   ??? dexamethasone  6 mg Intravenous Q24H   ??? escitalopram oxalate  20 mg Oral Daily   ??? famotidine  20 mg Enteral tube: gastric  Daily   ??? heparin (porcine) for subcutaneous use  7,500 Units Subcutaneous [facility]   ??? insulin NPH  10 Units Subcutaneous Q12H [facility]   ??? insulin regular  0-12 Units Subcutaneous Q6H [facility]   ??? levETIRAcetam  500 mg Enteral tube: gastric  Daily   ??? levothyroxine  100 mcg Enteral tube: gastric  daily   ???  LORazepam  8 mg Enteral tube: gastric  Q4H   ??? metoclopramide  10 mg Intravenous Q6H [facility]   ??? oxyCODONE  60 mg Enteral tube: gastric  Q4H   ??? polyethylen glycol  17 g Enteral tube: gastric  TID   ??? pravastatin  40 mg Enteral tube: gastric  Daily   ??? QUEtiapine  50 mg Enteral tube: gastric  TID   ??? sennosides  10 mL Enteral tube: gastric  Nightly   ??? Tacrolimus  0.5 mg Enteral tube: gastric  BID   ??? VECuronium  10 mg Intravenous Once       PRN medications:  CISATRacurium, dextrose 50 % in water (D50W), HYDROmorphone, midazolam    Data/Imaging Review: Reviewed in Epic and personally interpreted on 07/12/2020. See EMR for detailed results.      Critical Care Attestation     This patient is critically ill or injured with the impairment of vital organ systems such that there is a high probability of imminent or life threatening deterioration in the patient's condition. This patient must remain in the ICU for ongoing evaluation of the comprehensive management plan outlined in this note. I directly provided critical care services as documented in this note and the critical care time spent (45 min) is exclusive of separately billable procedures.    Lanae Crumbly, NP

## 2020-07-12 NOTE — Unmapped (Signed)
[facility] Nephrology Continuous Renal Replacement Therapy Procedure Note     07/10/2020    Evelyn Rollins was seen and examined on CRRT    CHIEF COMPLAINT: Acute Kidney Disease    INTERVAL HISTORY: continuous renal replacement initiated d/t worsening metabolic and respiratory acidosis. +supine positioning d/t severe hypoxemic respiratory failure. urine output 905 ml.    CURRENT DIALYSIS PRESCRIPTION:  Device: CRRT Device: NxStage  Therapy fluid: Therapy Fluid : NxStage RFP 401 - Contains 4 mEq/L KCL  Therapy fluid rate: Therapy Fluid Rate (L/hr): 2.7 L/hr  Blood flow rate: Blood Pump Rate (mL/min): 300 mL/min  Fluid removal rate: Hourly Fluid Removal Rate (mL/hr): 10 mL/hr    PHYSICAL EXAM:  Vitals:  Temp:  [36.5 ??C (97.7 ??F)-37.1 ??C (98.8 ??F)] 36.5 ??C (97.7 ??F)  Heart Rate:  [52-74] 59  Resp:  [0-30] 3  FiO2 (%):  [55 %-100 %] 100 %  SpO2:  [81 %-100 %] 96 %    I/O last 3 completed shifts:  In: 2918.6 [I.V.:1348.6; NG/GT:1420; IV Piggyback:150]  Out: 3301 [Urine:2320; Emesis/NG output:450; Other:81; Stool:450]     Weights:  Admission Weight: (!) 104.8 kg (231 lb)  Last documented Weight: (!) 108 kg (238 lb 1.6 oz)  Weight Change from Previous Day: No weight listed for specified days    Assessment:   General: ill appearing supine positioning  Pulmonary: +intubated/mech ventilation  Cardiovascular: regular rate and rhythm  Extremities: trace edema  Access: Left IJ non-tunneled catheter     LAB DATA:  Lab Results   Component Value Date    NA 138 07/10/2020    K 4.8 07/10/2020    CL 108 07/10/2020    CO2 21.0 07/10/2020    BUN 59 (H) 07/10/2020    CREATININE 2.2 (H) 07/10/2020    CALCIUM 8.3 (L) 07/10/2020    MG 2.2 07/10/2020    PHOS 5.3 07/10/2020    ALBUMIN 1.9 (L) 07/10/2020      Lab Results   Component Value Date    HCT 35.7 (L) 07/10/2020    HGB 11.2 (L) 07/10/2020    WBC 8.8 07/10/2020        ASSESSMENT/PLAN:  Acute Kidney Disease on Continuous Renal Replacement Therapy:  - UF goal: 70mL/hr as tolerated  - Anticoagulation: none  - Renal dose all medications    Prince Rome, MD  [facility] Division of Nephrology & Hypertension

## 2020-07-12 NOTE — Unmapped (Signed)
Pt continues to be ventilator dependant and is also on crrt. Pt has been placed in supine position and prone position today during day shift. Pt is currently on prvc 380 32 +14. Staff will continue to monitor.

## 2020-07-12 NOTE — Unmapped (Addendum)
ADVANCE CARE PLANNING NOTE    Discussion Date:  July 12, 2020    Patient has decisional capacity:  No    Patient has selected a Health Care Decision-Maker if loses capacity: No    Health Care Decision Maker as of 07/12/2020      Discussion Participants:  Evelyn Rollins, husband; Evelyn Rollins, daughter; son; daughter-in-law; son-in-law; mother; Laverle Patter, MICU attending; Jackson Latino, MICU APP    Communication of Medical Status/Prognosis:   Explained to family that we are currently providing Evelyn Rollins with maximum ventilator support for oxygenation including paralysis and proning and that her oxygenation is still marginal. Also explained that her blood pressure has gotten worse and that she is requiring more vasopressor and that I am concerned she has a new infection and that our ability to remove fluid with dialysis is limited by her low blood pressure. Overall, I explained that she was very very sick and that if her heart were to stop because it can't get enough oxygen or her blood pressure gets too low that she has nearly no chance of survival because we cannot fix the underlying issues.    Communication of Treatment Goals/Options:   Explained that if her heart were to stop that we could do CPR/shocks/medications but that it would not change her outcome but may cause pain or suffering. Alternatively we could continue to do everything that we can to support her but that if her heart stopped we would focus on making her comfortable. The family expressed strong faith and that they were praying for a miracle.     Treatment Decisions:   Family elected to keep patient full code. We will continue to address.    I spent between 16-45 minutes providing voluntary advance care planning services for this patient.

## 2020-07-12 DEATH — deceased

## 2020-07-13 LAB — BLOOD GAS CRITICAL CARE PANEL, ARTERIAL
BASE EXCESS ARTERIAL: -0.2 (ref -2.0–2.0)
BASE EXCESS ARTERIAL: -1.3 (ref -2.0–2.0)
BASE EXCESS ARTERIAL: -1.8 (ref -2.0–2.0)
BASE EXCESS ARTERIAL: -2.4 — ABNORMAL LOW (ref -2.0–2.0)
CALCIUM IONIZED ARTERIAL (MG/DL): 5.06 mg/dL (ref 4.40–5.40)
CALCIUM IONIZED ARTERIAL (MG/DL): 4.82 mg/dL (ref 4.40–5.40)
CALCIUM IONIZED ARTERIAL (MG/DL): 4.89 mg/dL (ref 4.40–5.40)
CALCIUM IONIZED ARTERIAL (MG/DL): 5.05 mg/dL (ref 4.40–5.40)
CALCIUM IONIZED ARTERIAL (MG/DL): 5.08 mg/dL (ref 4.40–5.40)
GLUCOSE WHOLE BLOOD: 105 mg/dL (ref 70–179)
GLUCOSE WHOLE BLOOD: 118 mg/dL (ref 70–179)
GLUCOSE WHOLE BLOOD: 133 mg/dL (ref 70–179)
GLUCOSE WHOLE BLOOD: 133 mg/dL (ref 70–179)
GLUCOSE WHOLE BLOOD: 93 mg/dL (ref 70–179)
GLUCOSE WHOLE BLOOD: 95 mg/dL (ref 70–179)
GLUCOSE WHOLE BLOOD: 98 mg/dL (ref 70–179)
HCO3 ARTERIAL: 24 mmol/L (ref 22–27)
HCO3 ARTERIAL: 24 mmol/L (ref 22–27)
HCO3 ARTERIAL: 25 mmol/L (ref 22–27)
HCO3 ARTERIAL: 25 mmol/L (ref 22–27)
HCO3 ARTERIAL: 26 mmol/L (ref 22–27)
HCO3 ARTERIAL: 26 mmol/L (ref 22–27)
HEMOGLOBIN BLOOD GAS: 7.2 g/dL — ABNORMAL LOW (ref 12.00–16.00)
HEMOGLOBIN BLOOD GAS: 7.5 g/dL — ABNORMAL LOW (ref 12.00–16.00)
HEMOGLOBIN BLOOD GAS: 7.6 g/dL — ABNORMAL LOW (ref 12.00–16.00)
HEMOGLOBIN BLOOD GAS: 8 g/dL — ABNORMAL LOW (ref 12.00–16.00)
HEMOGLOBIN BLOOD GAS: 9.9 g/dL — ABNORMAL LOW (ref 12.00–16.00)
LACTATE BLOOD ARTERIAL: 1.5 mmol/L — ABNORMAL HIGH (ref <1.3)
LACTATE BLOOD ARTERIAL: 1.5 mmol/L — ABNORMAL HIGH (ref <1.3)
LACTATE BLOOD ARTERIAL: 1.6 mmol/L — ABNORMAL HIGH (ref <1.3)
LACTATE BLOOD ARTERIAL: 1.6 mmol/L — ABNORMAL HIGH (ref <1.3)
LACTATE BLOOD ARTERIAL: 1.6 mmol/L — ABNORMAL HIGH (ref <1.3)
LACTATE BLOOD ARTERIAL: 1.7 mmol/L — ABNORMAL HIGH (ref <1.3)
LACTATE BLOOD ARTERIAL: 2.2 mmol/L — ABNORMAL HIGH (ref <1.3)
O2 SATURATION ARTERIAL: 92.1 % — ABNORMAL LOW (ref 94.0–100.0)
O2 SATURATION ARTERIAL: 93.3 % — ABNORMAL LOW (ref 94.0–100.0)
O2 SATURATION ARTERIAL: 93.6 % — ABNORMAL LOW (ref 94.0–100.0)
O2 SATURATION ARTERIAL: 94.9 % (ref 94.0–100.0)
O2 SATURATION ARTERIAL: 95 % (ref 94.0–100.0)
O2 SATURATION ARTERIAL: 95.4 % (ref 94.0–100.0)
O2 SATURATION ARTERIAL: 96.8 % (ref 94.0–100.0)
PCO2 ARTERIAL: 46.5 mmHg — ABNORMAL HIGH (ref 35.0–45.0)
PCO2 ARTERIAL: 47.2 mmHg — ABNORMAL HIGH (ref 35.0–45.0)
PCO2 ARTERIAL: 49 mmHg — ABNORMAL HIGH (ref 35.0–45.0)
PCO2 ARTERIAL: 49.9 mmHg — ABNORMAL HIGH (ref 35.0–45.0)
PCO2 ARTERIAL: 54.6 mmHg — ABNORMAL HIGH (ref 35.0–45.0)
PCO2 ARTERIAL: 57.2 mmHg — ABNORMAL HIGH (ref 35.0–45.0)
PH ARTERIAL: 7.26 — ABNORMAL LOW (ref 7.35–7.45)
PH ARTERIAL: 7.26 — ABNORMAL LOW (ref 7.35–7.45)
PH ARTERIAL: 7.32 — ABNORMAL LOW (ref 7.35–7.45)
PH ARTERIAL: 7.33 — ABNORMAL LOW (ref 7.35–7.45)
PH ARTERIAL: 7.33 — ABNORMAL LOW (ref 7.35–7.45)
PH ARTERIAL: 7.34 — ABNORMAL LOW (ref 7.35–7.45)
PO2 ARTERIAL: 67.5 mmHg — ABNORMAL LOW (ref 80.0–110.0)
PO2 ARTERIAL: 74.9 mmHg — ABNORMAL LOW (ref 80.0–110.0)
PO2 ARTERIAL: 74.6 mmHg — ABNORMAL LOW (ref 80.0–110.0)
PO2 ARTERIAL: 70 mmHg — ABNORMAL LOW (ref 80.0–110.0)
PO2 ARTERIAL: 90.8 mmHg (ref 80.0–110.0)
POTASSIUM WHOLE BLOOD: 4.2 mmol/L (ref 3.4–4.6)
POTASSIUM WHOLE BLOOD: 4.3 mmol/L (ref 3.4–4.6)
POTASSIUM WHOLE BLOOD: 4.6 mmol/L (ref 3.4–4.6)
POTASSIUM WHOLE BLOOD: 4.6 mmol/L (ref 3.4–4.6)
POTASSIUM WHOLE BLOOD: 4.7 mmol/L — ABNORMAL HIGH (ref 3.4–4.6)
POTASSIUM WHOLE BLOOD: 4.9 mmol/L — ABNORMAL HIGH (ref 3.4–4.6)
SODIUM WHOLE BLOOD: 137 mmol/L (ref 135–145)
SODIUM WHOLE BLOOD: 137 mmol/L (ref 135–145)
SODIUM WHOLE BLOOD: 137 mmol/L (ref 135–145)
SODIUM WHOLE BLOOD: 137 mmol/L (ref 135–145)
SODIUM WHOLE BLOOD: 137 mmol/L (ref 135–145)
SODIUM WHOLE BLOOD: 138 mmol/L (ref 135–145)

## 2020-07-13 LAB — CBC
HEMATOCRIT: 25.2 % — ABNORMAL LOW (ref 36.0–46.0)
MEAN CORPUSCULAR HEMOGLOBIN CONC: 31.7 g/dL (ref 31.0–37.0)
MEAN CORPUSCULAR HEMOGLOBIN CONC: 31.4 g/dL (ref 31.0–37.0)
MEAN CORPUSCULAR HEMOGLOBIN: 29.2 pg (ref 26.0–34.0)
MEAN CORPUSCULAR HEMOGLOBIN: 29.2 pg (ref 26.0–34.0)
MEAN CORPUSCULAR VOLUME: 92.2 fL (ref 80.0–100.0)
MEAN CORPUSCULAR VOLUME: 93.1 fL (ref 80.0–100.0)
MEAN PLATELET VOLUME: 8.9 fL (ref 7.0–10.0)
MEAN PLATELET VOLUME: 8.9 fL (ref 7.0–10.0)
PLATELET COUNT: 104 10*9/L — ABNORMAL LOW (ref 150–440)
RED BLOOD CELL COUNT: 2.8 10*12/L — ABNORMAL LOW (ref 4.00–5.20)
RED CELL DISTRIBUTION WIDTH: 14.9 % (ref 12.0–15.0)
RED CELL DISTRIBUTION WIDTH: 15.1 % — ABNORMAL HIGH (ref 12.0–15.0)
WBC ADJUSTED: 14.1 10*9/L — ABNORMAL HIGH (ref 4.5–11.0)
WBC ADJUSTED: 19.5 10*9/L — ABNORMAL HIGH (ref 4.5–11.0)

## 2020-07-13 LAB — RED CELL DISTRIBUTION WIDTH: Lab: 14.9

## 2020-07-13 LAB — COMPREHENSIVE METABOLIC PANEL
ALBUMIN: 1.9 g/dL — ABNORMAL LOW (ref 3.4–5.0)
ALBUMIN: 2.2 g/dL — ABNORMAL LOW (ref 3.4–5.0)
ALKALINE PHOSPHATASE: 62 U/L (ref 46–116)
ALKALINE PHOSPHATASE: 54 U/L (ref 46–116)
ALKALINE PHOSPHATASE: 67 U/L (ref 46–116)
ALT (SGPT): <7 U/L — ABNORMAL LOW (ref 10–49)
ALT (SGPT): <7 U/L — ABNORMAL LOW (ref 10–49)
ALT (SGPT): <7 U/L — ABNORMAL LOW (ref 10–49)
ANION GAP: 6 mmol/L (ref 5–14)
ANION GAP: 4 mmol/L — ABNORMAL LOW (ref 5–14)
ANION GAP: 5 mmol/L (ref 5–14)
AST (SGOT): 37 U/L — ABNORMAL HIGH (ref <=34)
AST (SGOT): 38 U/L — ABNORMAL HIGH (ref <=34)
AST (SGOT): 40 U/L — ABNORMAL HIGH (ref <=34)
BILIRUBIN TOTAL: 0.2 mg/dL — ABNORMAL LOW (ref 0.3–1.2)
BILIRUBIN TOTAL: 0.3 mg/dL (ref 0.3–1.2)
BILIRUBIN TOTAL: 0.3 mg/dL (ref 0.3–1.2)
BLOOD UREA NITROGEN: 22 mg/dL (ref 9–23)
BLOOD UREA NITROGEN: 17 mg/dL (ref 9–23)
BUN / CREAT RATIO: 16
BUN / CREAT RATIO: 14
BUN / CREAT RATIO: 13
CALCIUM: 8.3 mg/dL — ABNORMAL LOW (ref 8.7–10.4)
CALCIUM: 8.2 mg/dL — ABNORMAL LOW (ref 8.7–10.4)
CALCIUM: 8.5 mg/dL — ABNORMAL LOW (ref 8.7–10.4)
CHLORIDE: 105 mmol/L (ref 98–107)
CHLORIDE: 107 mmol/L (ref 98–107)
CHLORIDE: 106 mmol/L (ref 98–107)
CO2: 25 mmol/L (ref 20.0–31.0)
CO2: 26 mmol/L (ref 20.0–31.0)
CO2: 26 mmol/L (ref 20.0–31.0)
CREATININE: 1.37 mg/dL — ABNORMAL HIGH
CREATININE: 1.26 mg/dL — ABNORMAL HIGH
EGFR CKD-EPI AA FEMALE: 50 mL/min/{1.73_m2} — ABNORMAL LOW (ref >=60)
EGFR CKD-EPI AA FEMALE: 55 mL/min/{1.73_m2} — ABNORMAL LOW (ref >=60)
EGFR CKD-EPI NON-AA FEMALE: 44 mL/min/{1.73_m2} — ABNORMAL LOW (ref >=60)
EGFR CKD-EPI NON-AA FEMALE: 48 mL/min/{1.73_m2} — ABNORMAL LOW (ref >=60)
EGFR CKD-EPI NON-AA FEMALE: 48 mL/min/{1.73_m2} — ABNORMAL LOW (ref >=60)
GLUCOSE RANDOM: 128 mg/dL (ref 70–179)
GLUCOSE RANDOM: 97 mg/dL (ref 70–179)
GLUCOSE RANDOM: 102 mg/dL (ref 70–179)
POTASSIUM: 4.5 mmol/L (ref 3.4–4.5)
POTASSIUM: 4.5 mmol/L (ref 3.4–4.5)
POTASSIUM: 4.7 mmol/L — ABNORMAL HIGH (ref 3.4–4.5)
PROTEIN TOTAL: 4.8 g/dL — ABNORMAL LOW (ref 5.7–8.2)
PROTEIN TOTAL: 5 g/dL — ABNORMAL LOW (ref 5.7–8.2)
PROTEIN TOTAL: 5.5 g/dL — ABNORMAL LOW (ref 5.7–8.2)
SODIUM: 136 mmol/L (ref 135–145)
SODIUM: 137 mmol/L (ref 135–145)
SODIUM: 137 mmol/L (ref 135–145)

## 2020-07-13 LAB — APTT
APTT: 30.2 s (ref 24.9–36.9)
Coagulation surface induced:Time:Pt:PPP:Qn:Coag: 30.1
Coagulation surface induced:Time:Pt:PPP:Qn:Coag: 30.2
HEPARIN CORRELATION: 0.2

## 2020-07-13 LAB — D-DIMER QUANTITATIVE (CW,ML,HL): Lab: 1563 — ABNORMAL HIGH

## 2020-07-13 LAB — CBC W/ AUTO DIFF
BASOPHILS ABSOLUTE COUNT: 0 10*9/L (ref 0.0–0.1)
BASOPHILS RELATIVE PERCENT: 0.2 %
EOSINOPHILS ABSOLUTE COUNT: 0.2 10*9/L (ref 0.0–0.4)
EOSINOPHILS RELATIVE PERCENT: 1.3 %
HEMATOCRIT: 27.4 % — ABNORMAL LOW (ref 36.0–46.0)
HEMOGLOBIN: 8.7 g/dL — ABNORMAL LOW (ref 12.0–16.0)
LYMPHOCYTES ABSOLUTE COUNT: 0.6 10*9/L — ABNORMAL LOW (ref 1.5–5.0)
LYMPHOCYTES RELATIVE PERCENT: 3.5 %
MEAN CORPUSCULAR HEMOGLOBIN: 29.2 pg (ref 26.0–34.0)
MEAN CORPUSCULAR VOLUME: 92 fL (ref 80.0–100.0)
MEAN PLATELET VOLUME: 9 fL (ref 7.0–10.0)
MONOCYTES ABSOLUTE COUNT: 0.2 10*9/L (ref 0.2–0.8)
MONOCYTES RELATIVE PERCENT: 1.4 %
NEUTROPHILS ABSOLUTE COUNT: 16 10*9/L — ABNORMAL HIGH (ref 2.0–7.5)
NEUTROPHILS RELATIVE PERCENT: 93.4 %
PLATELET COUNT: 123 10*9/L — ABNORMAL LOW (ref 150–440)
RED BLOOD CELL COUNT: 2.98 10*12/L — ABNORMAL LOW (ref 4.00–5.20)
RED CELL DISTRIBUTION WIDTH: 15 % (ref 12.0–15.0)
WBC ADJUSTED: 17.2 10*9/L — ABNORMAL HIGH (ref 4.5–11.0)

## 2020-07-13 LAB — PHOSPHORUS
PHOSPHORUS: 2.9 mg/dL (ref 2.4–5.1)
Phosphate:MCnc:Pt:Ser/Plas:Qn:: 2.9
Phosphate:MCnc:Pt:Ser/Plas:Qn:: 3.1
Phosphate:MCnc:Pt:Ser/Plas:Qn:: 3.4

## 2020-07-13 LAB — ALT (SGPT): Alanine aminotransferase:CCnc:Pt:Ser/Plas:Qn:: <7 — ABNORMAL LOW

## 2020-07-13 LAB — TACROLIMUS, TROUGH: Lab: 3.2 — ABNORMAL LOW

## 2020-07-13 LAB — MEAN CORPUSCULAR HEMOGLOBIN CONC: Erythrocyte mean corpuscular hemoglobin concentration:MCnc:Pt:RBC:Qn:Automated count: 31.4

## 2020-07-13 LAB — MAGNESIUM
Magnesium:MCnc:Pt:Ser/Plas:Qn:: 1.7
Magnesium:MCnc:Pt:Ser/Plas:Qn:: 1.9
Magnesium:MCnc:Pt:Ser/Plas:Qn:: 1.9

## 2020-07-13 LAB — BASE EXCESS ARTERIAL
Base excess:SCnc:Pt:BldA:Qn:Calculated: -0.5
Base excess:SCnc:Pt:BldA:Qn:Calculated: -2.4 — ABNORMAL LOW

## 2020-07-13 LAB — LACTATE BLOOD ARTERIAL: Lactate:SCnc:Pt:BldA:Qn:: 1.5 — ABNORMAL HIGH

## 2020-07-13 LAB — LYMPHOCYTES RELATIVE PERCENT: Lymphocytes/100 leukocytes:NFr:Pt:Bld:Qn:Automated count: 3.5

## 2020-07-13 LAB — HCO3 ARTERIAL: Bicarbonate:SCnc:Pt:BldA:Qn:: 25

## 2020-07-13 LAB — BUN / CREAT RATIO: Urea nitrogen/Creatinine:MRto:Pt:Ser/Plas:Qn:: 14

## 2020-07-13 LAB — SPECIMEN SOURCE

## 2020-07-13 LAB — EGFR CKD-EPI NON-AA FEMALE
Glomerular filtration rate/1.73 sq M.predicted.non black:ArVRat:Pt:Ser/Plas/Bld:Qn:Creatinine-based formula (CKD-EPI): 44 — ABNORMAL LOW

## 2020-07-13 LAB — HEPARIN CORRELATION: Lab: 0.2

## 2020-07-13 LAB — CALCIUM IONIZED ARTERIAL (MG/DL)
Calcium.ionized:MCnc:Pt:Bld:Qn:: 5.06
Calcium.ionized:MCnc:Pt:Bld:Qn:: 5.08

## 2020-07-13 MED ADMIN — metoclopramide (REGLAN) injection 10 mg: 10 mg | INTRAVENOUS | @ 04:00:00

## 2020-07-13 MED ADMIN — HYDROmorphone 1 mg/mL in 0.9% sodium chloride: 0-12 mg/h | INTRAVENOUS | @ 23:00:00 | Stop: 2020-07-16

## 2020-07-13 MED ADMIN — LORazepam (ATIVAN) tablet 8 mg: 8 mg | GASTROENTERAL | @ 04:00:00

## 2020-07-13 MED ADMIN — oxyCODONE (ROXICODONE) immediate release tablet 60 mg: 60 mg | GASTROENTERAL | @ 17:00:00 | Stop: 2020-07-17

## 2020-07-13 MED ADMIN — NxStage RFP 401 (+/- BB) 5000 mL - contains 4 mEq/L of potassium dialysis solution 5,000 mL: 5000 mL | INTRAVENOUS_CENTRAL | @ 03:00:00

## 2020-07-13 MED ADMIN — polyethylene glycol (MIRALAX) packet 17 g: 17 g | GASTROENTERAL | @ 13:00:00

## 2020-07-13 MED ADMIN — LORazepam (ATIVAN) tablet 8 mg: 8 mg | GASTROENTERAL | @ 17:00:00

## 2020-07-13 MED ADMIN — NxStage RFP 401 (+/- BB) 5000 mL - contains 4 mEq/L of potassium dialysis solution 5,000 mL: 5000 mL | INTRAVENOUS_CENTRAL | @ 22:00:00

## 2020-07-13 MED ADMIN — midazolam in sodium chloride 0.9% (1 mg/mL) infusion PMB: 0-12 mg/h | INTRAVENOUS | @ 17:00:00

## 2020-07-13 MED ADMIN — polyethylene glycol (MIRALAX) packet 17 g: 17 g | GASTROENTERAL | @ 17:00:00

## 2020-07-13 MED ADMIN — NxStage RFP 401 (+/- BB) 5000 mL - contains 4 mEq/L of potassium dialysis solution 5,000 mL: 5000 mL | INTRAVENOUS_CENTRAL | @ 17:00:00

## 2020-07-13 MED ADMIN — heparin (porcine) 7,500 units/0.75 mL syringe: 7500 [IU] | SUBCUTANEOUS | @ 01:00:00

## 2020-07-13 MED ADMIN — LORazepam (ATIVAN) tablet 8 mg: 8 mg | GASTROENTERAL | @ 20:00:00

## 2020-07-13 MED ADMIN — pravastatin (PRAVACHOL) tablet 40 mg: 40 mg | GASTROENTERAL | @ 13:00:00

## 2020-07-13 MED ADMIN — levETIRAcetam (KEPPRA) tablet 500 mg: 500 mg | GASTROENTERAL | @ 13:00:00

## 2020-07-13 MED ADMIN — HYDROmorphone 1 mg/mL in 0.9% sodium chloride: 0-12 mg/h | INTRAVENOUS | @ 06:00:00 | Stop: 2020-07-16

## 2020-07-13 MED ADMIN — aspirin chewable tablet 81 mg: 81 mg | GASTROENTERAL | @ 13:00:00

## 2020-07-13 MED ADMIN — cefepime (MAXIPIME) 2 g in dextrose 100 mL IVPB (premix): 2 g | INTRAVENOUS | @ 15:00:00 | Stop: 2020-07-17

## 2020-07-13 MED ADMIN — buPROPion (WELLBUTRIN) tablet 100 mg: 100 mg | GASTROENTERAL | @ 01:00:00

## 2020-07-13 MED ADMIN — levothyroxine (SYNTHROID) tablet 100 mcg: 100 ug | GASTROENTERAL | @ 09:00:00

## 2020-07-13 MED ADMIN — albumin human 25 % bottle: INTRAVENOUS | @ 17:00:00 | Stop: 2020-07-13

## 2020-07-13 MED ADMIN — famotidine (PEPCID) tablet 20 mg: 20 mg | GASTROENTERAL | @ 13:00:00

## 2020-07-13 MED ADMIN — white petrolatum-mineral oiL (SOOTHE PM) 80-20 % ophthalmic ointment 1 application: 1 | OPHTHALMIC | @ 13:00:00

## 2020-07-13 MED ADMIN — magnesium sulfate 2gm/50mL IVPB: 2 g | INTRAVENOUS | @ 04:00:00 | Stop: 2020-07-13

## 2020-07-13 MED ADMIN — HYDROmorphone 1 mg/mL in 0.9% sodium chloride: 0-12 mg/h | INTRAVENOUS | @ 10:00:00 | Stop: 2020-07-16

## 2020-07-13 MED ADMIN — oxyCODONE (ROXICODONE) immediate release tablet 60 mg: 60 mg | GASTROENTERAL | @ 05:00:00 | Stop: 2020-07-17

## 2020-07-13 MED ADMIN — metoclopramide (REGLAN) injection 10 mg: 10 mg | INTRAVENOUS | @ 17:00:00

## 2020-07-13 MED ADMIN — heparin (porcine) 7,500 units/0.75 mL syringe: 7500 [IU] | SUBCUTANEOUS | @ 09:00:00

## 2020-07-13 MED ADMIN — midazolam in sodium chloride 0.9% (1 mg/mL) infusion PMB: 0-12 mg/h | INTRAVENOUS

## 2020-07-13 MED ADMIN — HYDROmorphone 1 mg/mL in 0.9% sodium chloride: 0-12 mg/h | INTRAVENOUS | @ 01:00:00 | Stop: 2020-07-16

## 2020-07-13 MED ADMIN — buPROPion (WELLBUTRIN) tablet 100 mg: 100 mg | GASTROENTERAL | @ 17:00:00

## 2020-07-13 MED ADMIN — NxStage RFP 401 (+/- BB) 5000 mL - contains 4 mEq/L of potassium dialysis solution 5,000 mL: 5000 mL | INTRAVENOUS_CENTRAL | @ 13:00:00

## 2020-07-13 MED ADMIN — polyethylene glycol (MIRALAX) packet 17 g: 17 g | GASTROENTERAL | @ 01:00:00

## 2020-07-13 MED ADMIN — midazolam in sodium chloride 0.9% (1 mg/mL) infusion PMB: 0-12 mg/h | INTRAVENOUS | @ 09:00:00

## 2020-07-13 MED ADMIN — chlorhexidine (PERIDEX) 0.12 % solution 5 mL: 5 mL | OROMUCOSAL | @ 12:00:00

## 2020-07-13 MED ADMIN — oxyCODONE (ROXICODONE) immediate release tablet 60 mg: 60 mg | GASTROENTERAL | @ 22:00:00 | Stop: 2020-07-17

## 2020-07-13 MED ADMIN — metoclopramide (REGLAN) injection 10 mg: 10 mg | INTRAVENOUS | @ 22:00:00

## 2020-07-13 MED ADMIN — LORazepam (ATIVAN) tablet 8 mg: 8 mg | GASTROENTERAL | @ 01:00:00

## 2020-07-13 MED ADMIN — buPROPion (WELLBUTRIN) tablet 100 mg: 100 mg | GASTROENTERAL | @ 13:00:00

## 2020-07-13 MED ADMIN — metoclopramide (REGLAN) injection 10 mg: 10 mg | INTRAVENOUS | @ 09:00:00

## 2020-07-13 MED ADMIN — cefepime (MAXIPIME) 2 g in dextrose 100 mL IVPB (premix): 2 g | INTRAVENOUS | @ 02:00:00 | Stop: 2020-07-17

## 2020-07-13 MED ADMIN — oxyCODONE (ROXICODONE) immediate release tablet 60 mg: 60 mg | GASTROENTERAL | @ 09:00:00 | Stop: 2020-07-17

## 2020-07-13 MED ADMIN — HYDROmorphone 1 mg/mL in 0.9% sodium chloride: 0-12 mg/h | INTRAVENOUS | @ 18:00:00 | Stop: 2020-07-16

## 2020-07-13 MED ADMIN — chlorhexidine (PERIDEX) 0.12 % solution 5 mL: 5 mL | OROMUCOSAL | @ 01:00:00

## 2020-07-13 MED ADMIN — QUEtiapine (SEROquel) tablet 50 mg: 50 mg | GASTROENTERAL | @ 17:00:00

## 2020-07-13 MED ADMIN — albumin human 25 % bottle 25 g: 25 g | INTRAVENOUS | @ 17:00:00 | Stop: 2020-07-13

## 2020-07-13 MED ADMIN — heparin (porcine) 7,500 units/0.75 mL syringe: 7500 [IU] | SUBCUTANEOUS | @ 17:00:00

## 2020-07-13 MED ADMIN — insulin NPH (HumuLIN,NovoLIN) injection 10 Units: 10 [IU] | SUBCUTANEOUS | @ 22:00:00 | Stop: 2020-07-13

## 2020-07-13 MED ADMIN — oxyCODONE (ROXICODONE) immediate release tablet 60 mg: 60 mg | GASTROENTERAL | @ 13:00:00 | Stop: 2020-07-17

## 2020-07-13 MED ADMIN — QUEtiapine (SEROquel) tablet 50 mg: 50 mg | GASTROENTERAL | @ 01:00:00

## 2020-07-13 MED ADMIN — norepinephrine 8 mg in sodium chloride 0.9 % 250 mL (32mcg/mL) infusion PMB: 0-30 ug/min | INTRAVENOUS | @ 06:00:00

## 2020-07-13 MED ADMIN — QUEtiapine (SEROquel) tablet 50 mg: 50 mg | GASTROENTERAL | @ 13:00:00

## 2020-07-13 NOTE — Unmapped (Signed)
MICU Daily Progress Note     Date of Service: 07/13/2020    Problem List:   Principal Problem:    Acute hypoxemic respiratory failure due to severe acute respiratory syndrome coronavirus 2 (SARS-CoV-2) disease (CMS-HCC)  Active Problems:    Liver transplant recipient (CMS-HCC)    Morbid obesity with BMI of 40.0-44.9, adult (CMS-HCC)    Acute renal failure (ARF) (CMS-HCC)  Resolved Problems:    * No resolved hospital problems. *      Interval history:  Evelyn Rollins is a 54 y.o. female with PMH cirrhosis status post liver transplant 2016 on chronic immunosuppressive therapy, hypertension, diabetes, hypothyroidism who presented to [facility] on 7/22 with myalgias, sore throat, loss of taste and fevers  x5 days. When her symptoms had started, she took a home Covid test on 7/17 which was found to be positive. She has had poor p.o. intake for the last several days. Her husband also has similar symptoms. In the OSH ED she was found to be severely hypoxic with O2 saturations of 50% on room air. She was placed on a nonrebreather and current saturations increased to the low 90s. Chest x-ray shows bilateral infiltrates consistent with COVID-19 pneumonia. Her Covid test is positive. Inflammatory markers are elevated. She received a dose of Decadron in the emergency room.    24hr events:   -TOF 4/4,   -UF remained at 50  -Fio2 weaned to 70%     Neurological   Analgesia and Sedation  -continue dilaudid and versed gtt  -cis gtt (7/31 -   -goal RASS 3- to 4-  -PRN dilaudid and versed  -ativan 8q4h, oxy 60q4h  ??  Anxiety, depression  -restarted home meds Wellbutrin/Lexapro, and Seroquel in place of abilify  ??  Seizure history (not taking AEDs), ?seizure activity  -neurology consult >> c/f RUE weakness, MRI 7/27- no acute pathology  -vEEG negative for seizure x 48hrs >> discont monitoring per neuro 7/25  - MRI with possible sz focus, starting keppra 500 mg daily per neuro recommendations       Pulmonary   ARDS, Acute resp failure r/t COVID, pnuemomediastinum   - cont lung protective ventilation strategies (71ml/kg, permissive hypercapnea)   -proning with supination trials daily, only lasts 4 hrs  -trial of supination today, likely will need re-proning for P/F ratio < 100   -now with probable VAP, abx started    Cardiovascular   h/o HTN   - holding home antihtn  - last echo 2016 w nml LVEF, enlrgd RV and deprssed RV fxn w mild phtn  -continue statin     Hypotension  -continue levophed for MAP goal >65    Renal   ARF on CRRT:  - cont foley for accurate I/O during critical illness   - replete electrolytes prn and monitor daily  - nephrology following  - CRRT goal UF 150  - goal - 1L    Infectious Disease/Autoimmune   COVID 19 infection  Symptom onset:??7/17  Exposure: unknown  COVID PCR+: 7/17  OSH Admission:??7/22  Day of admission/transfer: 7/22  OSH Meds Given:??dexamethasone  COVID Specific??Meds:??dexamethasone 7/22, remdesivir 7/22  Vaccination status: not vaccinated  ??  Monitoring:  - Special airborne/contact precautions (If unavailable, droplet & contact precautions)  - f/u daily CMP, CBC w/ diff, CRP, D-dimer, DIC, troponin, pro-BNP with weekly ferritin, LDH and cytokine level  - f/u q6h lactate; ABG  ??  Medications:  - completed remdesivir  - cont decadron 6mg  po  daily x  10d, started 7/22  ??  CAP/VAP: MSSA   - febrile, leukocytosis   - started cefep/vanc 7/25--> changed to ancef on 7/27 x7 days  - pan cx for T>38.2   - wbc count rising, hypoxia worsening, broad spectrum abx (vanc/cefe) started 8/1    Cultures:  Blood Culture, Routine (no units)   Date Value   07/12/2020 No Growth at 24 hours   07/12/2020 No Growth at 24 hours     Lower Respiratory Culture (no units)   Date Value   07/12/2020 Too Young To Read; No Predominant Pathogen.   07/04/2020 3+ Methicillin-Susceptible Staphylococcus aureus (A)     WBC (10*9/L)   Date Value   07/13/2020 17.2 (H)     WBC, UA (/HPF)   Date Value   07/12/2020 7 (H)          FEN/GI   -trickle tube feeds > high residuals, continue reglan  -bowel regimen w senna and miralax, FMS with stool  -PPI  ??     Malnutrition Assessment: Not done yet.  Immunocompromised s/p Liver transplant in 2016  -restarted tacro 1mg  q12  - f/u tacro level  -HOLD cellcept    Heme/Coag   Hypercoagulable state 2/2 covid  -anticoagulation group C heparin  -LE dopplers neg for DVT 7/24  -no evid bleeding    Endocrine   History of DM2  -insulin gtt stopped  -ISS  -NPH 10 bid    Hypothyroidism  -continue synthroid  ??  Integumentary   - WOCN consulted for high risk skin assessment Yes.  - WOCN recs >> pending, ordered 7/25   - cont pressure mitigating precautions per skin policy    Prophylaxis/LDA/Restraints/Consults   Can CVC be removed? No: need for medications requiring central access (e.g. pressors)   Can A-line be removed? No: frequent ABGs  Can Foley be removed? No: Need continuous I/O  Mobility plan: Step 1 - Range of motion    Feeding: Trickle feeds, advance as tolerated  Analgesia: No pain issues  Sedation SAT/SBT: No PEEP > 8  Thromboembolic ppx: Enoxaparin  Head of bed >30 degrees: Yes  Ulcer ppx: Yes, coagulopathy  Glucose within target range: Yes, in range    Does patient need/have an active type/screen? NA    RASS at goal? Yes  [place] Agitation Assessment Scale (RASS) : -5 (07/13/2020 10:00 AM)     Can antipsychotics be stopped? N/A, not on antipsychotics  CAM-ICU Result: Positive (07/13/2020  8:00 AM)      Would hospice care be appropriate for this patient? No, patient improving or expected to improve    Patient Lines/Drains/Airways Status    Active Active Lines, Drains, & Airways     Name:   Placement date:   Placement time:   Site:   Days:    ETT  7.5   06/19/2020    1700     10    CVC Triple Lumen 06/27/2020 Non-tunneled Right Internal jugular   07/11/2020    1945    Internal jugular   10    Hemodialysis Catheter With Distal Infusion Port 07/09/20 Left Internal jugular 1.4 mL 1.4 mL   07/09/20    1700    Internal jugular   3 NG/OG Tube Decompression;Feedings Center mouth   07/03/20    0610    Center mouth   10    Urethral Catheter   06/20/2020    ???    ???   11    Arterial Line 06/30/2020  Right Radial   06/24/2020    2315    Radial   10              Patient Lines/Drains/Airways Status    Active Wounds     Name:   Placement date:   Placement time:   Site:   Days:    Wound 07/11/20 Pressure Injury Face Left DTI   07/11/20    1100    Face   2                Goals of Care     Code Status: Full Code    Designated Healthcare Decision Maker:  Ms. Wos current decisional capacity for healthcare decision-making is incapacitated. Her designated Educational psychologist) is/are her husband Vernia Buff .      Subjective     Intubated, sedated, prone    Objective     Vitals - past 24 hours  Temp:  [34.7 ??C (94.4 ??F)-37 ??C (98.6 ??F)] 34.7 ??C (94.4 ??F)  Heart Rate:  [77-97] 77  SpO2 Pulse:  [77-97] 77  Resp:  [26-36] 33  FiO2 (%):  [75 %-95 %] 95 %  SpO2:  [90 %-97 %] 91 % Intake/Output  I/O last 3 completed shifts:  In: 4201.7 [I.V.:2001.7; NG/GT:1500; IV Piggyback:700]  Out: 3271 [Urine:120; Emesis/NG output:920; Other:1111; Stool:1120]     ??  Physical Exam:   General: NAD, well nourished, obese, intubated, sedated, prone  HEENT:  normocephalic, trachea mdl, anicteric, no scleral edema, no conjuctival erythema/drainage, MMM and pink   CV: RRR. S1 and S2 normal, no m/r/c/g. no bruit, or JVD. DP Pulses 2  equal. 1+ BLE edema.  Lungs:   chest rise symm, diminished bases. CTA bilat, no wheezes/crackles/rhonchi. Good air movement.  Skin:   w/d/i, no jaundice present, no rashes, lesions, petechiae or breakdown. no drng, erythema.    Abd: contour, abdomen soft, non-tender and not distended. Normoactive bowel sounds x 4 quads, no rebound tenderness or guarding.   Ext: No cyanosis, bruising, clubbing or edema.   Neuro: perrl 3Sl, mdl, orbital edema, +c/c/g,     Continuous Infusions:   ??? CISATRacurium 4 mcg/kg/min (07/13/20 0711)   ??? HYDROmorphone 11 mg/hr (07/13/20 1028)   ??? midazolam (1 mg/mL) infusion 12 mg/hr (07/13/20 0600)   ??? norepinephrine bitartrate-NS 4 mcg/min (07/13/20 1058)   ??? NxStage RFP 400 (+/- BB) 5000 mL - contains 2 mEq/L of potassium     ??? NxStage RFP 401 (+/- BB) 5000 mL - contains 4 mEq/L of potassium     ??? vasopressin Stopped (07/12/20 0900)       Scheduled Medications:   ??? albumin human  25 g Intravenous BID   ??? aspirin  81 mg Enteral tube: gastric  Daily   ??? buPROPion  100 mg Enteral tube: gastric  TID   ??? Cefepime  2 g Intravenous Q12H   ??? chlorhexidine  5 mL Mouth BID   ??? escitalopram oxalate  20 mg Enteral tube: gastric  Daily   ??? famotidine  20 mg Enteral tube: gastric  Daily   ??? heparin (porcine) for subcutaneous use  7,500 Units Subcutaneous [facility]   ??? insulin NPH  10 Units Subcutaneous Q12H [facility]   ??? insulin regular  0-12 Units Subcutaneous Q6H SCH   ??? levETIRAcetam  500 mg Enteral tube: gastric  Daily   ??? levothyroxine  100 mcg Enteral tube: gastric  daily   ??? LORazepam  8 mg Enteral tube: gastric  Q4H   ??? metoclopramide  10 mg Intravenous Q6H SCH   ??? oxyCODONE  60 mg Enteral tube: gastric  Q4H   ??? polyethylen glycol  17 g Enteral tube: gastric  TID   ??? pravastatin  40 mg Enteral tube: gastric  Daily   ??? QUEtiapine  50 mg Enteral tube: gastric  TID   ??? sennosides  10 mL Enteral tube: gastric  Nightly   ??? Tacrolimus  1 mg Enteral tube: gastric  BID   ??? vancomycin  1,500 mg Intravenous Q24H   ??? white petrolatum-mineral oiL  1 application Both Eyes BID       PRN medications:  CISATRacurium, dextrose 50 % in water (D50W), HYDROmorphone, midazolam    Data/Imaging Review: Reviewed in Epic and personally interpreted on 07/13/2020. See EMR for detailed results.      Critical Care Attestation     This patient is critically ill or injured with the impairment of vital organ systems such that there is a high probability of imminent or life threatening deterioration in the patient's condition. This patient must remain in the ICU for ongoing evaluation of the comprehensive management plan outlined in this note. I directly provided critical care services as documented in this note and the critical care time spent (45 min) is exclusive of separately billable procedures.    Lanae Crumbly, NP

## 2020-07-13 NOTE — Unmapped (Signed)
IMMUNOCOMPROMISED HOST INFECTIOUS DISEASE PROGRESS NOTE    Evelyn Rollins is being seen in consultation at the request of Montine Circle, * for evaluation of COVID-19 in transplant patient.    Assessment/Recommendations:    Evelyn Rollins is a 54 y.o. female with history of NAFLD s/p OLT in 2016, DM2 not on insulin (unknown A1c), seizures x1 in remote past and morbid obesity who presented to an OSH on 7/22 with 7 days of symptoms and profound hypoxia due to critical COVID-19 ARDS requiring intubation on 7/22, now with c/f secondary bacterial PNA w/ MSSA from positive sputum cultures on 7/24. Coverage broadened 8/1 after increasing WBC and new pressor requirement concerning for worsening infection.    Unclear if recent change increased WBC and pressor requirement representative of worsening PNA as CXRs difficult to interpret. Now with GNRs in addition to GPCs on gram stain but may be impossible to tell if this is colonization versus infection.    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   - Surgical complications: biliary anastomosis leak with pelvic collection, s/p abx  - Serologies: CMV D-/R+, EBV D+/R+, Toxo D?/R-  - Induction: basiliximab  - Immunosuppression: myfortic, tac  - Prophylaxis: none    Pertinent Exposure History  Donor Infections: Donor death from drowning     Pertinent Co-morbidities  # Morbid obesity  # Hypothyroidism  # DM - unknown A1c  # Depression/anxiety  # Seizure x1 in remote past    Drug Intolerances - none     Infection History  # Critical SARS CoV-2 infection with ARDS requiring intubation and w/ c/f MSSA PNA (7/22) in unvaccinated patient, onset 06/24/20   -Known exposure(s): daughter, son-in-law on 7/10  -Date of symptom onset : 7/14  -Date of diagnostic test: 7/17 (home test), 7/22 at OSH  -Remdesivir administered: 7/22- 7/25  -Steroids administered: dexamethasone 7/22-7/30  -CCP 1U or 2U administered: n/a  -Monoclonal antibody administered: n/a -Tocilizumab administered: n/a  -07/04/20 Lower resp Cx: 3+ MSSA  -07/12/20 Uptrending WBC and pressors restarted with sputum Cx: 1+ GPCs, 1+ GNRs  - Prior Abx: Cefepime & Vancomycin 7/25-7/26, cefazolin 7/27-7/30  - Current Abx: Cefepime & Vancomycin 8/1-    S Epi bacteremia (MRSE), S hominis 07/04/20 favored as contaminants  Favored as a contaminant as only growing 1 of 2 bottles from 7/24.  -07/04/20 BC 1 of 2 w/ MRSE  -07/06/20 BC x 2 negative       RECOMMENDATIONS FOR 07/13/2020    Diagnostic  ??? F/u 8/1 BCx  ??? F/u 8/1 Sputum Cx for organism ID  ??? F/u 8/2 MRSA screen    Treatment  ??? OK to continue broad spectrum coverage for presumed VAP PNA for 14d course (7/25-8/7), recommend stopping vanco if MRSA screen negative and if no Pseudomonas on sputum cx switching to cefazolin and levofloxacin for optimal MSSA coverage along with GNR coverage          The [facility] ID service will continue to follow from afar. Please page the ID Transplant/Liquid Oncology Fellow consult at [phone] with questions. Patient discussed with Dr. Mila Homer.    Karlton Lemon, MD, PhD  Infectious Diseases Fellow    Interim History:      The patient has had worsening pressor requirements over past several days with elevated WBC to 28.4 now downtrending. Has been afebrile however today with increasing ventilatory needs. On exam sedated and unresponsive on ventilator. Per chart review goals of care according to family includes all  life-sustaining treatment at this time.    Allergies:  No Known Allergies    Medications:   Antimicrobials:  Anti-infectives (From admission, onward)    Start     Dose/Rate Route Frequency Ordered Stop    07/12/20 1100  vancomycin (VANCOCIN) 1500 mg in sodium chloride (NS) 0.9 % 500 mL IVPB (premix)      1,500 mg  333.3 mL/hr over 90 Minutes Intravenous Every 24 hours 07/12/20 1011 07/17/20 1059    07/12/20 1100  cefepime (MAXIPIME) 2 g in dextrose 100 mL IVPB (premix)      2 g  200 mL/hr over 30 Minutes Intravenous Every 12 hours 07/12/20 1011 07/17/20 1059          Current/Prior immunomodulators:  Dexamethasone 6mg  7/22-7/30  Myfortic  Tac    Other medications reviewed.     Review of Systems:  All other systems reviewed are negative.        Vital Signs last 24 hours:  Temp:  [34.7 ??C-37 ??C] 35.7 ??C  Heart Rate:  [77-101] 101  SpO2 Pulse:  [77-101] 101  Resp:  [26-33] 33  A BP-2: (89-145)/(31-70) 94/40  MAP:  [46 mmHg-88 mmHg] 53 mmHg  FiO2 (%):  [75 %-95 %] 95 %  SpO2:  [87 %-96 %] 89 %    Physical Exam:  Patient Lines/Drains/Airways Status    Active Active Lines, Drains, & Airways     Name:   Placement date:   Placement time:   Site:   Days:    ETT  7.5   07-19-2020    1700     10    CVC Triple Lumen 07-19-2020 Non-tunneled Right Internal jugular   19-Jul-2020    1945    Internal jugular   10    Hemodialysis Catheter With Distal Infusion Port 07/09/20 Left Internal jugular 1.4 mL 1.4 mL   07/09/20    1700    Internal jugular   3    NG/OG Tube Decompression;Feedings Center mouth   07/03/20    0610    Center mouth   10    Urethral Catheter   07-19-20    ???    ???   11    Arterial Line July 19, 2020 Right Radial   07-19-20    2315    Radial   10              General: Ill-appearing; Intubated and sedated  HEENT: Normocephalic; Pupils equal, round, minimally reactive  Cardiovascular: Normal rate and rhythm; No murmurs appreciated  Respiratory: Mechanical breath sounds   Abdominal: Abdomen is soft  Neurologic: Non-responsive to phiscal or verbal stimulus  Skin: Grossly edematous  Extremities / MSK: Warm; Peripheral pulses intact    Data for Medical Decision Making  ( [id number] )     Recent Labs   Lab Units 07/13/20  1255 07/13/20  0855 07/13/20  0534 07/13/20  0009 07/13/20  0009 07/12/20  1933 07/12/20  1933 07/12/20  1523 07/12/20  1523 07/12/20  1117 07/12/20  1117 07/12/20  0803 07/12/20  0803 07/12/20  0535 07/11/20  2316 07/09/20  0737 07/09/20  0354 07/08/20  0744 07/08/20  0332   WBC 10*9/L 14.1*  --  17.2*  --   --   --  19.4*  --   --   --  28.4* --  27.0*  --   --    < > 18.0*  --  15.5*   HEMOGLOBIN g/dL 8.0*  --  8.7*  --   --   --  9.3*  --   --   --  10.3*  --  10.4*  --   --    < > 11.6*  --  11.0*   HEMOGLOBIN BG   --   --   --   --   --   --   --   --   --   --   --   --   --    < >  --    < >  --   --   --    PLATELET COUNT (1) 10*9/L 104*  --  123*  --   --   --  153  --   --   --  240  --  247  --   --    < > 496*  --  473*   NEUTRO ABS 10*9/L  --   --  16.0*  --   --   --   --   --   --   --   --   --  25.0*  --   --   --  16.8*  --  14.5*   LYMPHO ABS 10*9/L  --   --  0.6*  --   --   --   --   --   --   --   --   --  0.9*  --   --   --  0.5*  --  0.5*   EOSINO ABS 10*9/L  --   --  0.2  --   --   --   --   --   --   --   --   --  0.5*  --   --   --  0.0  --  0.0   BUN mg/dL 18  --  22  --   --   --  24*  --   --   --  28*  --  32*  --   --    < > 138*   < > 117*   CREATININE mg/dL 7.37*  --  1.06*  --   --   --  1.45*  --   --   --  1.59*  --  1.66*  --   --    < > 4.73*   < > 4.11*   AST U/L 38*  --  37*  --   --   --  39*  --   --   --  38*  --  35*  --   --    < > 52*   < > 45*   ALT U/L <7*  --  <7*  --   --   --  <7*  --   --   --  9*  --  10  --   --    < > 18   < > 42   BILIRUBIN TOTAL mg/dL 0.3  --  0.2*  --   --   --  0.2*  --   --   --  0.2*  --  0.2*  --   --    < > 0.2*   < > <0.2*   ALK PHOS U/L 54  --  62  --   --   --  60  --   --   --  65  --  65  --   --    < > 60   < >  62   POTASSIUM WHOLE BLOOD mmol/L 4.3 4.2 4.6   < > 4.6   < > 4.7*   < > 4.6   < > 4.6   < > 4.6   < > 4.9*   < > 5.1*   < >  --    POTASSIUM mmol/L 4.5  --  4.5  --   --   --  4.8*  --   --    < > 4.7*   < > 4.7*  --   --    < > 5.2*   < > 4.7*   MAGNESIUM mg/dL  --  1.9  --   --  1.7  --   --   --  1.6  --   --   --  1.7  --  1.7   < > 2.7*  --  2.5   PHOSPHORUS mg/dL  --  2.9  --   --  3.4  --   --   --  3.4  --   --   --  2.8  --  4.0   < > 6.0*  --  3.5   CALCIUM mg/dL 8.2*  --  8.3*  --   --   --  8.2*  --   --   --  8.2*  --  8.1*  --   --    < > 8.5*   < > 8.3*    < > = values in this interval not displayed.       Drug Level   Results in Past 360 Days  Result Component Current Result   Vancomycin Rm 14.2 (07/05/2020)   Tacrolimus, Trough 3.2 (L) (07/13/2020)       New Culture Data   Microbiology Results (last day)     Procedure Component Value Date/Time Date/Time    Blood Culture, Adult [[id number]] Collected: 07/12/20 0853    Lab Status: Preliminary result Specimen: Blood from 1 Peripheral Draw Updated: 07/13/20 0930     Blood Culture, Routine No Growth at 24 hours    Blood Culture, Adult [[id number]] Collected: 07/12/20 0853    Lab Status: Preliminary result Specimen: Blood from 1 Peripheral Draw Updated: 07/13/20 0930     Blood Culture, Routine No Growth at 24 hours    MRSA Screen [[id number]] Collected: 07/13/20 5784    Lab Status: In process Specimen: Nares from Nose Updated: 07/13/20 0913    Lower Respiratory Culture [[id number]] Collected: 07/12/20 1142    Lab Status: Preliminary result Specimen: Aspirate, Tracheal from Tracheal aspirate Updated: 07/13/20 0700     Lower Respiratory Culture Too Young To Read; No Predominant Pathogen.     Gram Stain 10-25 PMNS/LPF      <10  Epithelial cells/LPF      1+ Gram positive cocci      1+ Gram positive bacilli      Acceptable for culture    Narrative:      Specimen Source: Tracheal aspirate        Recent Studies   None recent

## 2020-07-13 NOTE — Unmapped (Signed)
Pt has been sedated and paralyzed on dilaudid, versed, and nimbex infusions. Minimal levo requirement while prone, UF increased to 150. Supinated at 1030 this am. Has been more hypotensive; up to 12 of levo and UF reduced to 10. Other VSS. Afebrile. On 75% PRVC while prone, 95% while supine. Will likely re-prone this afternoon. Gastric residuals improving from yesterday, slowly increasing feeds. Foley and FMS in place. URO diminished. CRRT ongoing. Falls precautions maintained. Q2hr turns. Subcutaneous heparin for VTE prophylaxis.     Problem: Adult Inpatient Plan of Care  Goal: Plan of Care Review  Outcome: Progressing  Goal: Patient-Specific Goal (Individualization)  Outcome: Progressing  Goal: Absence of Hospital-Acquired Illness or Injury  Outcome: Progressing  Goal: Optimal Comfort and Wellbeing  Outcome: Progressing  Goal: Readiness for Transition of Care  Outcome: Progressing  Goal: Rounds/Family Conference  Outcome: Progressing     Problem: Skin Injury Risk Increased  Goal: Skin Health and Integrity  Outcome: Progressing     Problem: Infection  Goal: Infection Symptom Resolution  Outcome: Progressing     Problem: Fall Injury Risk  Goal: Absence of Fall and Fall-Related Injury  Outcome: Progressing     Problem: Wound  Goal: Optimal Wound Healing  Outcome: Progressing     Problem: Communication Impairment (Mechanical Ventilation, Invasive)  Goal: Effective Communication  Outcome: Progressing     Problem: Device-Related Complication Risk (Mechanical Ventilation, Invasive)  Goal: Optimal Device Function  Outcome: Progressing     Problem: Inability to Wean (Mechanical Ventilation, Invasive)  Goal: Mechanical Ventilation Liberation  Outcome: Progressing     Problem: Skin and Tissue Injury (Mechanical Ventilation, Invasive)  Goal: Absence of Device-Related Skin and Tissue Injury  Outcome: Progressing     Problem: Ventilator-Induced Lung Injury (Mechanical Ventilation, Invasive)  Goal: Absence of Ventilator-Induced Lung Injury  Outcome: Progressing     Problem: Self-Care Deficit  Goal: Improved Ability to Complete Activities of Daily Living  Outcome: Progressing     Problem: Asthma Comorbidity  Goal: Maintenance of Asthma Control  Outcome: Progressing     Problem: COPD Comorbidity  Goal: Maintenance of COPD Symptom Control  Outcome: Progressing     Problem: Diabetes Comorbidity  Goal: Blood Glucose Level Within Desired Range  Outcome: Progressing     Problem: Heart Failure Comorbidity  Goal: Maintenance of Heart Failure Symptom Control  Outcome: Progressing     Problem: Hypertension Comorbidity  Goal: Blood Pressure in Desired Range  Outcome: Progressing     Problem: Obstructive Sleep Apnea Risk or Actual (Comorbidity Management)  Goal: Unobstructed Breathing During Sleep  Outcome: Progressing     Problem: Pain Chronic (Persistent) (Comorbidity Management)  Goal: Acceptable Pain Control and Functional Ability  Outcome: Progressing     Problem: Seizure Disorder Comorbidity  Goal: Maintenance of Seizure Control  Outcome: Progressing     Problem: LTC COVID-19 Confirmed or Rule-Out  Goal: Patient/Resident will remain free of complications due to COVID-19  Description: 1. Review and update the patient/resident's isolation status in the Isolation activity  2. Keep the patient/resident's door closed at all times and limit movement of the patient/resident outside of the room to medically essential purposes. If applicable, transfer patient/resident to a single-person room   3. Educate and reinforce infection prevention and control practices recommended by CDC  4. Frequently monitor for development of more severe symptoms   5. Use appropriate PPE when providing care for patient/resident  6. Reinforce no visitor policy and non-essential health care personnel policy, except for certain compassionate care situations  7. If worsening of symptoms occur, alert the nearest Hospital caring for confirmed COVID-19 patients and arrange for transfer with proper precautions including placing a facemask on the patient/resident during transfer  8. Communicate information about known or suspected case of COVID-19 to appropriate public health personnel  9. Avoid procedures that are likely to induce coughing (e.g., sputum induction, open suctioning of airways). If required, do so in an Airborne Infection Isolation Room. The health care provider in the room should wear an N95 or higher-level respirator, eye protection, gloves, and a gown. The number of HCP present during the procedure should be limited to only those essential for patient/resident care and procedure support. Visitors should not be present for the procedure. Clean and disinfect procedure room surfaces promptly  10. Update patient/resident and family/representatives as needed      Outcome: Progressing     Problem: Gas Exchange Impaired  Goal: Optimal Gas Exchange  Outcome: Progressing     Problem: Electrolyte Imbalance (Acute Kidney Injury/Impairment)  Goal: Serum Electrolyte Balance  Outcome: Progressing     Problem: Fluid Imbalance (Acute Kidney Injury/Impairment)  Goal: Optimal Fluid Balance  Outcome: Progressing     Problem: Hematologic Alteration (Acute Kidney Injury/Impairment)  Goal: Hemoglobin, Hematocrit and Platelets Within Normal Range  Outcome: Progressing     Problem: Oral Intake Inadequate (Acute Kidney Injury/Impairment)  Goal: Optimal Nutrition Intake  Outcome: Progressing     Problem: Renal Function Impairment (Acute Kidney Injury/Impairment)  Goal: Effective Renal Function  Outcome: Progressing

## 2020-07-13 NOTE — Unmapped (Signed)
Tacrolimus Therapeutic Monitoring Pharmacy Note    Evelyn Rollins is a 54 y.o. female continuing tacrolimus.     Indication: Liver transplant     Date of Transplant: 06/2015      Prior Dosing Information: Current regimen 1mg  BID      Goals:  Therapeutic Drug Levels  Tacrolimus trough goal: 3-5 ng/mL    Additional Clinical Monitoring/Outcomes  ?? Monitor renal function (SCr and urine output) and liver function (LFTs)  ?? Monitor for signs/symptoms of adverse events (e.g., hyperglycemia, hyperkalemia, hypomagnesemia, hypertension, headache, tremor)    Results:   Tacrolimus level: 3.2 ng/mL, drawn appropriately    Pharmacokinetic Considerations and Significant Drug Interactions:  ??? Concurrent hepatotoxic medications: None identified  ??? Concurrent CYP3A4 substrates/inhibitors: None identified  ??? Concurrent nephrotoxic medications: None identified    Assessment/Plan:  Recommendedation(s)  ??? Continue current regimen of 1mg  BID    Follow-up  ??? Daily levels until stable.   ??? A pharmacist will continue to monitor and recommend levels as appropriate    Please page service pharmacist with questions/clarifications.    Charleen Kirks, PharmD

## 2020-07-13 NOTE — Unmapped (Signed)
[facility] Nephrology Continuous Renal Replacement Therapy Procedure Note     07/13/2020    Evelyn Rollins was seen and examined on CRRT    CHIEF COMPLAINT: Acute Kidney Disease    INTERVAL HISTORY: Labile BP's and have been trending down which has limited UF.    CURRENT DIALYSIS PRESCRIPTION:  Device: CRRT Device: NxStage  Therapy fluid: Therapy Fluid : NxStage RFP 401 - Contains 4 mEq/L KCL  Therapy fluid rate: Therapy Fluid Rate (L/hr): 2.7 L/hr  Blood flow rate: Blood Pump Rate (mL/min): 300 mL/min  Fluid removal rate: Hourly Fluid Removal Rate (mL/hr): 10 mL/hr    PHYSICAL EXAM:  Vitals:  Temp:  [34.7 ??C-37 ??C] 35.7 ??C  Heart Rate:  [77-93] 92  SpO2 Pulse:  [77-93] 92  MAP:  [46 mmHg-88 mmHg] 54 mmHg  A BP-2: (89-145)/(31-70) 91/42  MAP:  [46 mmHg-88 mmHg] 54 mmHg    In/Outs:    Intake/Output Summary (Last 24 hours) at 07/13/2020 1505  Last data filed at 07/13/2020 1400  Gross per 24 hour   Intake 2732.32 ml   Output 1557 ml   Net 1175.32 ml        Weights:  Admission Weight: (!) 104.8 kg (231 lb)  Last documented Weight: (!) 108 kg (238 lb 1.6 oz)  Weight Change from Previous Day: No weight listed for specified days    Assessment:   General: Appearing ill  Pulmonary: prone; intubated; lungs clear  Cardiovascular: distant heart sounds  Extremities: trace edema  Access: Left IJ non-tunneled catheter     LAB DATA:  Lab Results   Component Value Date    NA 137 07/13/2020    NA 137 07/13/2020    K 4.5 07/13/2020    K 4.3 07/13/2020    CL 107 07/13/2020    CO2 26.0 07/13/2020    BUN 18 07/13/2020    CREATININE 1.26 (H) 07/13/2020    CALCIUM 8.2 (L) 07/13/2020    MG 1.9 07/13/2020    PHOS 2.9 07/13/2020    ALBUMIN 1.9 (L) 07/13/2020      Lab Results   Component Value Date    HCT 25.2 (L) 07/13/2020    HGB 8.0 (L) 07/13/2020    WBC 14.1 (H) 07/13/2020        ASSESSMENT/PLAN:  Acute Kidney Disease on Continuous Renal Replacement Therapy:  - UF goal: 50 mL/hr as tolerated  - Anticoagulation: N/A  - Renally dose all medications; prograf adjusted today per pharmacy    Justine Null, MD  [facility] Division of Nephrology & Hypertension

## 2020-07-13 NOTE — Unmapped (Signed)
Patient sedated and paralyzed. 4/4 twitches still, not triggering vent. NSR. Levo from 4-12, increased UF when levo titrated down. Afebrile. 70% PRVC. Swam q4. Daughter updated. Will continue to monitor.  Problem: Adult Inpatient Plan of Care  Goal: Plan of Care Review  Outcome: Ongoing - Unchanged  Goal: Patient-Specific Goal (Individualization)  Outcome: Ongoing - Unchanged  Goal: Absence of Hospital-Acquired Illness or Injury  Outcome: Ongoing - Unchanged  Goal: Optimal Comfort and Wellbeing  Outcome: Ongoing - Unchanged  Goal: Readiness for Transition of Care  Outcome: Ongoing - Unchanged  Goal: Rounds/Family Conference  Outcome: Ongoing - Unchanged     Problem: Skin Injury Risk Increased  Goal: Skin Health and Integrity  Outcome: Ongoing - Unchanged     Problem: Infection  Goal: Infection Symptom Resolution  Outcome: Ongoing - Unchanged     Problem: Fall Injury Risk  Goal: Absence of Fall and Fall-Related Injury  Outcome: Ongoing - Unchanged     Problem: Wound  Goal: Optimal Wound Healing  Outcome: Ongoing - Unchanged     Problem: Communication Impairment (Mechanical Ventilation, Invasive)  Goal: Effective Communication  Outcome: Ongoing - Unchanged     Problem: Device-Related Complication Risk (Mechanical Ventilation, Invasive)  Goal: Optimal Device Function  Outcome: Ongoing - Unchanged     Problem: Inability to Wean (Mechanical Ventilation, Invasive)  Goal: Mechanical Ventilation Liberation  Outcome: Ongoing - Unchanged     Problem: Skin and Tissue Injury (Mechanical Ventilation, Invasive)  Goal: Absence of Device-Related Skin and Tissue Injury  Outcome: Ongoing - Unchanged     Problem: Ventilator-Induced Lung Injury (Mechanical Ventilation, Invasive)  Goal: Absence of Ventilator-Induced Lung Injury  Outcome: Ongoing - Unchanged     Problem: Self-Care Deficit  Goal: Improved Ability to Complete Activities of Daily Living  Outcome: Ongoing - Unchanged     Problem: Asthma Comorbidity  Goal: Maintenance of Asthma Control  Outcome: Ongoing - Unchanged     Problem: COPD Comorbidity  Goal: Maintenance of COPD Symptom Control  Outcome: Ongoing - Unchanged     Problem: Diabetes Comorbidity  Goal: Blood Glucose Level Within Desired Range  Outcome: Ongoing - Unchanged     Problem: Heart Failure Comorbidity  Goal: Maintenance of Heart Failure Symptom Control  Outcome: Ongoing - Unchanged     Problem: Hypertension Comorbidity  Goal: Blood Pressure in Desired Range  Outcome: Ongoing - Unchanged     Problem: Obstructive Sleep Apnea Risk or Actual (Comorbidity Management)  Goal: Unobstructed Breathing During Sleep  Outcome: Ongoing - Unchanged     Problem: Pain Chronic (Persistent) (Comorbidity Management)  Goal: Acceptable Pain Control and Functional Ability  Outcome: Ongoing - Unchanged     Problem: Seizure Disorder Comorbidity  Goal: Maintenance of Seizure Control  Outcome: Ongoing - Unchanged     Problem: LTC COVID-19 Confirmed or Rule-Out  Goal: Patient/Resident will remain free of complications due to COVID-19  Description: 1. Review and update the patient/resident's isolation status in the Isolation activity  2. Keep the patient/resident's door closed at all times and limit movement of the patient/resident outside of the room to medically essential purposes. If applicable, transfer patient/resident to a single-person room   3. Educate and reinforce infection prevention and control practices recommended by CDC  4. Frequently monitor for development of more severe symptoms   5. Use appropriate PPE when providing care for patient/resident  6. Reinforce no visitor policy and non-essential health care personnel policy, except for certain compassionate care situations  7. If worsening of symptoms occur, alert  the nearest Hospital caring for confirmed COVID-19 patients and arrange for transfer with proper precautions including placing a facemask on the patient/resident during transfer  8. Communicate information about known or suspected case of COVID-19 to appropriate public health personnel  9. Avoid procedures that are likely to induce coughing (e.g., sputum induction, open suctioning of airways). If required, do so in an Airborne Infection Isolation Room. The health care provider in the room should wear an N95 or higher-level respirator, eye protection, gloves, and a gown. The number of HCP present during the procedure should be limited to only those essential for patient/resident care and procedure support. Visitors should not be present for the procedure. Clean and disinfect procedure room surfaces promptly  10. Update patient/resident and family/representatives as needed      Outcome: Ongoing - Unchanged     Problem: Gas Exchange Impaired  Goal: Optimal Gas Exchange  Outcome: Ongoing - Unchanged     Problem: Electrolyte Imbalance (Acute Kidney Injury/Impairment)  Goal: Serum Electrolyte Balance  Outcome: Ongoing - Unchanged     Problem: Fluid Imbalance (Acute Kidney Injury/Impairment)  Goal: Optimal Fluid Balance  Outcome: Ongoing - Unchanged     Problem: Hematologic Alteration (Acute Kidney Injury/Impairment)  Goal: Hemoglobin, Hematocrit and Platelets Within Normal Range  Outcome: Ongoing - Unchanged     Problem: Oral Intake Inadequate (Acute Kidney Injury/Impairment)  Goal: Optimal Nutrition Intake  Outcome: Ongoing - Unchanged     Problem: Renal Function Impairment (Acute Kidney Injury/Impairment)  Goal: Effective Renal Function  Outcome: Ongoing - Unchanged

## 2020-07-13 NOTE — Unmapped (Signed)
Pt remains in prone position and ventilator dependent on prvc 400 33 +16. Head turns regularly without complication. Staff will continue to monitor.

## 2020-07-13 NOTE — Unmapped (Signed)
[facility] Nephrology Continuous Renal Replacement Therapy Procedure Note     07/12/2020    Evelyn Rollins was seen and examined on CRRT    CHIEF COMPLAINT: Acute Kidney Disease    INTERVAL HISTORY: Labile BP's and have been trending down which has limited UF.    CURRENT DIALYSIS PRESCRIPTION:  Device: CRRT Device: NxStage  Therapy fluid: Therapy Fluid : NxStage RFP 401 - Contains 4 mEq/L KCL  Therapy fluid rate: Therapy Fluid Rate (L/hr): 2.7 L/hr  Blood flow rate: Blood Pump Rate (mL/min): 300 mL/min  Fluid removal rate: Hourly Fluid Removal Rate (mL/hr): 50 mL/hr    PHYSICAL EXAM:  Vitals:  Temp:  [36.6 ??C (97.9 ??F)-36.8 ??C (98.2 ??F)] 36.7 ??C (98.1 ??F)  Heart Rate:  [81-106] 91  SpO2 Pulse:  [85-105] 91  MAP:  [56 mmHg-88 mmHg] 63 mmHg  A BP-2: (96-143)/(42-67) 112/45  MAP:  [56 mmHg-88 mmHg] 63 mmHg    In/Outs:    Intake/Output Summary (Last 24 hours) at 07/12/2020 1820  Last data filed at 07/12/2020 1800  Gross per 24 hour   Intake 2155.12 ml   Output 1916 ml   Net 239.12 ml        Weights:  Admission Weight: (!) 104.8 kg (231 lb)  Last documented Weight: (!) 108 kg (238 lb 1.6 oz)  Weight Change from Previous Day: No weight listed for specified days    Assessment:   General: Appearing ill  Pulmonary: prone; intubated; lungs clear  Cardiovascular: distant heart sounds  Extremities: trace edema  Access: Left IJ non-tunneled catheter     LAB DATA:  Lab Results   Component Value Date    NA 136 07/12/2020    K 4.6 07/12/2020    CL 106 07/12/2020    CO2 24.0 07/12/2020    BUN 28 (H) 07/12/2020    CREATININE 1.59 (H) 07/12/2020    CALCIUM 8.2 (L) 07/12/2020    MG 1.6 07/12/2020    PHOS 3.4 07/12/2020    ALBUMIN 1.7 (L) 07/12/2020      Lab Results   Component Value Date    HCT 32.2 (L) 07/12/2020    HGB 10.3 (L) 07/12/2020    WBC 28.4 (H) 07/12/2020        ASSESSMENT/PLAN:  Acute Kidney Disease on Continuous Renal Replacement Therapy:  - UF goal: 50 mL/hr as tolerated  - Anticoagulation: N/A  - Renally dose all medications; prograf adjusted today per pharmacy    Evelyn Rollins Evelyn Chick, MD  [employer] Division of Nephrology & Hypertension

## 2020-07-13 NOTE — Unmapped (Signed)
MICU Evening Summary     Date of Service: 07/12/2020    Interval History: Evelyn Rollins is a 54 y.o. 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 ??Pt was intubated for hypoxic respiratory failure. Critical care services are indicated for AHRF r/t covid pna, AKI, ileus, VAP.       Assessment & Plan     Neuro: cont scheduled seroquel/ativan/oxy w dilaudid/versed gtts and cisat. TOF 4/4 yet vent sync and oxygenation unchanged after vec boluses. If dyssync then plan to switch cisat to vec gtt, lacrlube added for corneal protection. Significant orbital edema since prone >16hrs, unable to assess pupils, no evid sz, last eeg no evid sz, cont keppra per neuro based on MRI findings (rt hippocampus lesion c/f possible sz focus?)  Pulm: cont prvc, wean fio2 for pao2 >65, DPs remain high at 20 w 32ml/kg for persistent hypercapnea, tolerating swims well, plan to supinate in am and f/u cxr   VH:QION levo for MAP>65, QTc 402, f/u in am  Renal: CRRT at 50 UF, unable to remove more volume BP limited, replete Mg, consider prefilter heparin if circuit clots again  GI: tol TF well on reglan, cont bowel regimen, FMS in situ  Heme: cont heparin group C, plan to in if ddimer >5000 FEU, T/S in am  Endo: cont nph 10 q12 plus ADI  ID/Immuno: ourse completed for MSSA pna yet started on vanc, cefep tod for increasing wbc, wbc now downtrend, afebrile, cont prograf, f/u level in am  Goals of care: ongoing    Critical Care Attestation     This patient is critically ill or injured with the impairment of vital organ systems such that there is a high probability of imminent or life threatening deterioration in the patient's condition. This patient must remain in the ICU for ongoing evaluation of the comprehensive management plan outlined in this note. I directly provided critical care services as documented in this note and the critical care time spent (45 min) is exclusive of separately billable procedures.    Camron Essman Fonnie Mu, ACNP

## 2020-07-13 NOTE — Unmapped (Signed)
WOCN Consult Services                                                 Wound Evaluation: Pressure Injury    Reason for Consult:   - High Risk Skin Assessment  - Initial  - Pressure Injury    Problem List:   Principal Problem:    Acute hypoxemic respiratory failure due to severe acute respiratory syndrome coronavirus 2 (SARS-CoV-2) disease (CMS-HCC)  Active Problems:    Liver transplant recipient (CMS-HCC)    Morbid obesity with BMI of 40.0-44.9, adult (CMS-HCC)    Acute renal failure (ARF) (CMS-HCC)    Assessment: Evelyn Rollins is a 54 y.o. female with PMH cirrhosis status post liver transplant 2016 on chronic immunosuppressive therapy, hypertension, diabetes, hypothyroidism who presented to [facility] on 7/22 with myalgias, sore throat, loss of taste and fevers  x5 days. When her symptoms had started, she took a home Covid test on 7/17 which was found to be positive. She has had poor p.o. intake for the last several days. Her husband also has similar symptoms. In the OSH ED she was found to be severely hypoxic with O2 saturations of 50% on room air. She was placed on a nonrebreather and current saturations increased to the low 90s.    She is now in the MICU on Covid precautions but was proned for 48 hours and will likely be proned again this afternoon. Pt with several wounds that are pressure related to the bilateral cheeks and nose. See photos below. Pt had a normal Hollister ET holder that caused these injuries but is now switched over to the new trial ET tube holder. I spoke with RT about padding proximal to the holder over the wounds. Folds intact, heels intact. I was not able to see the sacrum today but nursing had no concerns and will re-eval after they prone her again. Proning protocol in place.               ??   07/13/20 1200   Wound 07/11/20 Pressure Injury Face Left cheek under prev ET tube holder- pt was proned DTI   Date First Assessed/Time First Assessed: 07/11/20 1100   Present on Hospital Admission: No  Primary Wound Type: Pressure Injury  Location: Face  Wound Location Orientation: Left  Wound Description (Comments): cheek under prev ET tube holder- pt was pr...   Dressing Status      No dressing   Wound Length (cm) 2 cm   Wound Width (cm) 2.9 cm   Wound Depth (cm) 0.1 cm   Wound Surface Area (cm^2) 5.8 cm^2   Wound Volume (cm^3) 0.58 cm^3   Wound Bed Purple/maroon discoloration   Peri-wound Assessment      Edema   Exudate Amnt      None   Dressing Foam (Non-bordered)   Wound 07/13/20 Pressure Injury Nose from ET holder. pt was proned  DTI   Date First Assessed/Time First Assessed: 07/13/20 1254   Present on Hospital Admission: No  Primary Wound Type: Pressure Injury  Location: Nose  Wound Description (Comments): from ET holder. pt was proned   Staging: DTI  Medical Device Related Pressur...   Dressing Status      No dressing   Wound Length (cm) 1 cm   Wound Width (cm) 0.4 cm   Wound Depth (  cm) 0.1 cm   Wound Surface Area (cm^2) 0.4 cm^2   Wound Volume (cm^3) 0.04 cm^3   Wound Bed Red;Purple/maroon discoloration   Odor None   Peri-wound Assessment      Intact   Exudate Amnt      None   Dressing Open to air   Wound 07/13/20 Pressure Injury Other (Comment) Right cheek r/t proning DTI   Date First Assessed/Time First Assessed: 07/13/20 1255   Present on Hospital Admission: No  Primary Wound Type: Pressure Injury  Location: Other (Comment)  Wound Location Orientation: Right  Wound Description (Comments): cheek r/t proning  Staging: DT...   Dressing Status      No dressing   Wound Length (cm) 1 cm   Wound Width (cm) 0.8 cm   Wound Depth (cm) 0.1 cm   Wound Surface Area (cm^2) 0.8 cm^2   Wound Volume (cm^3) 0.08 cm^3   Wound Bed Manson Passey;Purple/maroon discoloration   Peri-wound Assessment      Other (Comment)  (friction )   Exudate Amnt      None   Dressing Foam (Non-bordered)  (RT to apply mepilex foam)       Continence Status:   Incontinence of bladder: Foley in place  Incontinent of bowel: Bowel program in place    Moisture Associated Skin Damage:   - none     Lab Results   Component Value Date    WBC 17.2 (H) 07/13/2020    HGB 8.7 (L) 07/13/2020    HCT 27.4 (L) 07/13/2020    CRP 131.0 (H) 2020/07/18    A1C 5.6 07/03/2020    GLUF 107 (H) 05/28/2020    GLU 128 07/13/2020    POCGLU 94 07/13/2020    ALBUMIN 1.5 (L) 07/13/2020    PREALBUMIN 11.2 (L) 07/15/2015    PROT 4.8 (L) 07/13/2020     Risk Factors:   - Extended Hospitalization  - Friction/shear  - Immobility  - Moisture  - Multiple co-morbidities  - Obesity    Braden Scale Score: 10         Support Surface:   - Low Air Loss - ICU    Type Debridement Completed By WOCN:  N/A    Teaching:  - Offloading  - Routine skin care  - Turning and repositioning  - Wound care    WOCN Recommendations: Mepilex Xt cut to fit above new ET trial holder, proning protocol in place, interdry can be used to folds  - See nursing orders for wound care instructions.  - Contact WOCN with questions, concerns, or wound deterioration.    Topical Therapy/Interventions:   - Foam  - Silicone bordered foam    Recommended Consults:  - Not Applicable    WOCN Follow Up:  - Weekly    Plan of Care Discussed With:   - RN primary  - RT    Supplies Ordered: No    Workup Time:   60 minutes     Hamilton Capri, RN-BSN, [facility]  [facility] Wound Arboriculturist   [phone]

## 2020-07-13 NOTE — Unmapped (Signed)
Evelyn Rollins is a 54 y.o. female with PMH cirrhosis status post liver transplant 2016 on chronic immunosuppressive therapy, hypertension, diabetes, hypothyroidism who presented to [facility] on 7/22 with myalgias, sore throat, loss of taste and fevers  x5 days. When her symptoms had started, she took a home Covid test on 7/17 which was found to be positive. She has had poor p.o. intake for the last several days. Her husband also has similar symptoms. In the OSH ED she was found to be severely hypoxic with O2 saturations of 50% on room air. She was placed on a nonrebreather and current saturations increased to the low 90s. Chest x-ray shows bilateral infiltrates consistent with COVID-19 pneumonia. Her Covid test is positive. Inflammatory markers are elevated. She received a dose of Decadron in the emergency room.      Her MICU course has been complicated by worsening hypoxic respiratory failure requiring proning, paralytics and deep sedation. She is only able to be supinated for 4 hours before hypoxia worsens. She has also gone into renal failure and has been on CRRT. Given increasing pressor requirement, worsening hypoxia, infiltrate in RLL and increase in WBC, she was started on vanc/cefe with plan to treat for a 7 day course. Vanc can be discontinued if MRSA swab is negative. Family does not want to give up on her, family meeting held on 8/1, see ACP note for details, she remains full code.

## 2020-07-13 NOTE — Unmapped (Signed)
Pt has been sedated and paralyzed this shift on dilaudid, versed, and nimbex infusions. Weaned off vaso, on levo. Systolic goal now 100. Other VSS, afebrile. Sats WNL on PRVC, weaned from 80 % to 75%. TF via OGT, increased residuals today. Restarted reglan. TF reduced to trickle rate. FMS and foley in place. Diminished URO. Q2hr swims, tolerating well today without adverse events. Subcutaneous heparin for VTE prophylaxis.

## 2020-07-13 NOTE — Unmapped (Signed)
Vancomycin Therapeutic Monitoring Pharmacy Note    Evelyn Rollins is a 54 y.o. female starting vancomycin. Date of therapy initiation: 07/12/20    Indication: Bacteremia/Sepsis    Prior Dosing Information: None/new initiation     Goals:  Therapeutic Drug Levels  Vancomycin trough goal: 15-20 mg/L    Additional Clinical Monitoring/Outcomes  Renal function, volume status (intake and output)    Results: Not applicable    Wt Readings from Last 1 Encounters:   07/09/20 (!) 108 kg (238 lb 1.6 oz)     Creatinine   Date Value Ref Range Status   07/13/2020 1.37 (H) 0.60 - 0.80 mg/dL Final   16/09/9603 5.40 (H) 0.60 - 0.80 mg/dL Final   98/10/9146 8.29 (H) 0.60 - 0.80 mg/dL Final        Pharmacokinetic Considerations and Significant Drug Interactions:  ??? Per CRRT dosing guidelines  ??? Concurrent nephrotoxic meds: not applicable    Assessment/Plan:  Recommendation(s)  ??? Start vancomycin 1500 mg q24h  ??? Estimated trough on recommended regimen: 17 mg/L    Follow-up  ??? Level due: in 3-5 days  ??? A pharmacist will continue to monitor and order levels as appropriate    Please page service pharmacist with questions/clarifications.    Desma Maxim, PharmD

## 2020-07-14 LAB — MAGNESIUM
Magnesium:MCnc:Pt:Ser/Plas:Qn:: 1.7
Magnesium:MCnc:Pt:Ser/Plas:Qn:: 1.8
Magnesium:MCnc:Pt:Ser/Plas:Qn:: 2

## 2020-07-14 LAB — APTT
APTT: 29.5 s (ref 24.9–36.9)
Coagulation surface induced:Time:Pt:PPP:Qn:Coag: 27.8
HEPARIN CORRELATION: 0.2
HEPARIN CORRELATION: 0.2

## 2020-07-14 LAB — COMPREHENSIVE METABOLIC PANEL
ALBUMIN: 1.9 g/dL — ABNORMAL LOW (ref 3.4–5.0)
ALKALINE PHOSPHATASE: 58 U/L (ref 46–116)
ALKALINE PHOSPHATASE: 74 U/L (ref 46–116)
ALKALINE PHOSPHATASE: 60 U/L (ref 46–116)
ALT (SGPT): <7 U/L — ABNORMAL LOW (ref 10–49)
ALT (SGPT): <7 U/L — ABNORMAL LOW (ref 10–49)
ALT (SGPT): <7 U/L — ABNORMAL LOW (ref 10–49)
ANION GAP: 5 mmol/L (ref 5–14)
ANION GAP: 3 mmol/L — ABNORMAL LOW (ref 5–14)
ANION GAP: 5 mmol/L (ref 5–14)
AST (SGOT): 36 U/L — ABNORMAL HIGH (ref <=34)
AST (SGOT): 33 U/L (ref <=34)
AST (SGOT): 26 U/L (ref <=34)
BILIRUBIN TOTAL: 0.2 mg/dL — ABNORMAL LOW (ref 0.3–1.2)
BILIRUBIN TOTAL: 0.2 mg/dL — ABNORMAL LOW (ref 0.3–1.2)
BILIRUBIN TOTAL: 0.2 mg/dL — ABNORMAL LOW (ref 0.3–1.2)
BLOOD UREA NITROGEN: 14 mg/dL (ref 9–23)
BLOOD UREA NITROGEN: 15 mg/dL (ref 9–23)
BLOOD UREA NITROGEN: 14 mg/dL (ref 9–23)
BUN / CREAT RATIO: 12
BUN / CREAT RATIO: 13
CALCIUM: 8.4 mg/dL — ABNORMAL LOW (ref 8.7–10.4)
CALCIUM: 8.6 mg/dL — ABNORMAL LOW (ref 8.7–10.4)
CALCIUM: 7.4 mg/dL — ABNORMAL LOW (ref 8.7–10.4)
CHLORIDE: 107 mmol/L (ref 98–107)
CHLORIDE: 105 mmol/L (ref 98–107)
CHLORIDE: 109 mmol/L — ABNORMAL HIGH (ref 98–107)
CO2: 26 mmol/L (ref 20.0–31.0)
CO2: 28 mmol/L (ref 20.0–31.0)
CO2: 24 mmol/L (ref 20.0–31.0)
CREATININE: 1.25 mg/dL — ABNORMAL HIGH
CREATININE: 1.24 mg/dL — ABNORMAL HIGH
CREATININE: 1.12 mg/dL — ABNORMAL HIGH
EGFR CKD-EPI AA FEMALE: 56 mL/min/{1.73_m2} — ABNORMAL LOW (ref >=60)
EGFR CKD-EPI AA FEMALE: 64 mL/min/{1.73_m2} (ref >=60)
EGFR CKD-EPI NON-AA FEMALE: 49 mL/min/{1.73_m2} — ABNORMAL LOW (ref >=60)
GLUCOSE RANDOM: 125 mg/dL (ref 70–179)
GLUCOSE RANDOM: 122 mg/dL (ref 70–179)
GLUCOSE RANDOM: 127 mg/dL (ref 70–179)
POTASSIUM: 4.7 mmol/L — ABNORMAL HIGH (ref 3.4–4.5)
POTASSIUM: 4.4 mmol/L (ref 3.4–4.5)
POTASSIUM: 4.3 mmol/L (ref 3.4–4.5)
PROTEIN TOTAL: 5.1 g/dL — ABNORMAL LOW (ref 5.7–8.2)
PROTEIN TOTAL: 4.5 g/dL — ABNORMAL LOW (ref 5.7–8.2)
SODIUM: 138 mmol/L (ref 135–145)
SODIUM: 138 mmol/L (ref 135–145)

## 2020-07-14 LAB — CBC W/ AUTO DIFF
BASOPHILS ABSOLUTE COUNT: 0 10*9/L (ref 0.0–0.1)
BASOPHILS RELATIVE PERCENT: 0.2 %
EOSINOPHILS ABSOLUTE COUNT: 0.2 10*9/L (ref 0.0–0.4)
EOSINOPHILS RELATIVE PERCENT: 1.1 %
HEMATOCRIT: 23.8 % — ABNORMAL LOW (ref 36.0–46.0)
HEMOGLOBIN: 7.5 g/dL — ABNORMAL LOW (ref 12.0–16.0)
LARGE UNSTAINED CELLS: 0 % (ref 0–4)
LYMPHOCYTES ABSOLUTE COUNT: 0.6 10*9/L — ABNORMAL LOW (ref 1.5–5.0)
LYMPHOCYTES RELATIVE PERCENT: 3.4 %
MEAN CORPUSCULAR HEMOGLOBIN CONC: 31.4 g/dL (ref 31.0–37.0)
MEAN CORPUSCULAR HEMOGLOBIN: 29.1 pg (ref 26.0–34.0)
MEAN CORPUSCULAR VOLUME: 92.8 fL (ref 80.0–100.0)
MEAN PLATELET VOLUME: 9.3 fL (ref 7.0–10.0)
MONOCYTES ABSOLUTE COUNT: 0.3 10*9/L (ref 0.2–0.8)
NEUTROPHILS ABSOLUTE COUNT: 14.9 10*9/L — ABNORMAL HIGH (ref 2.0–7.5)
NEUTROPHILS RELATIVE PERCENT: 93.2 %
RED BLOOD CELL COUNT: 2.57 10*12/L — ABNORMAL LOW (ref 4.00–5.20)
RED CELL DISTRIBUTION WIDTH: 15.1 % — ABNORMAL HIGH (ref 12.0–15.0)
WBC ADJUSTED: 16 10*9/L — ABNORMAL HIGH (ref 4.5–11.0)

## 2020-07-14 LAB — BLOOD GAS CRITICAL CARE PANEL, ARTERIAL
BASE EXCESS ARTERIAL: -0.6 (ref -2.0–2.0)
BASE EXCESS ARTERIAL: -1.3 (ref -2.0–2.0)
BASE EXCESS ARTERIAL: -1.4 (ref -2.0–2.0)
BASE EXCESS ARTERIAL: 0.5 (ref -2.0–2.0)
BASE EXCESS ARTERIAL: 0.6 (ref -2.0–2.0)
BASE EXCESS ARTERIAL: 0.7 (ref -2.0–2.0)
BASE EXCESS ARTERIAL: 1.6 (ref -2.0–2.0)
CALCIUM IONIZED ARTERIAL (MG/DL): 5.12 mg/dL (ref 4.40–5.40)
CALCIUM IONIZED ARTERIAL (MG/DL): 5.11 mg/dL (ref 4.40–5.40)
CALCIUM IONIZED ARTERIAL (MG/DL): 4.96 mg/dL (ref 4.40–5.40)
CALCIUM IONIZED ARTERIAL (MG/DL): 5.16 mg/dL (ref 4.40–5.40)
CALCIUM IONIZED ARTERIAL (MG/DL): 4.91 mg/dL (ref 4.40–5.40)
FIO2 ARTERIAL: 80
GLUCOSE WHOLE BLOOD: 115 mg/dL (ref 70–179)
GLUCOSE WHOLE BLOOD: 120 mg/dL (ref 70–179)
GLUCOSE WHOLE BLOOD: 132 mg/dL (ref 70–179)
GLUCOSE WHOLE BLOOD: 141 mg/dL (ref 70–179)
HCO3 ARTERIAL: 26 mmol/L (ref 22–27)
HCO3 ARTERIAL: 26 mmol/L (ref 22–27)
HCO3 ARTERIAL: 26 mmol/L (ref 22–27)
HCO3 ARTERIAL: 27 mmol/L (ref 22–27)
HEMOGLOBIN BLOOD GAS: 6.9 g/dL — ABNORMAL LOW (ref 12.00–16.00)
HEMOGLOBIN BLOOD GAS: 7 g/dL — ABNORMAL LOW (ref 12.00–16.00)
HEMOGLOBIN BLOOD GAS: 7.1 g/dL — ABNORMAL LOW (ref 12.00–16.00)
HEMOGLOBIN BLOOD GAS: 7.5 g/dL — ABNORMAL LOW (ref 12.00–16.00)
HEMOGLOBIN BLOOD GAS: 8.9 g/dL — ABNORMAL LOW (ref 12.00–16.00)
HEMOGLOBIN BLOOD GAS: 9.6 g/dL — ABNORMAL LOW (ref 12.00–16.00)
HEMOGLOBIN BLOOD GAS: 9.9 g/dL — ABNORMAL LOW (ref 12.00–16.00)
LACTATE BLOOD ARTERIAL: 1.4 mmol/L — ABNORMAL HIGH (ref <1.3)
LACTATE BLOOD ARTERIAL: 1.5 mmol/L — ABNORMAL HIGH (ref <1.3)
LACTATE BLOOD ARTERIAL: 1.5 mmol/L — ABNORMAL HIGH (ref <1.3)
LACTATE BLOOD ARTERIAL: 1.6 mmol/L — ABNORMAL HIGH (ref <1.3)
LACTATE BLOOD ARTERIAL: 1.6 mmol/L — ABNORMAL HIGH (ref <1.3)
O2 SATURATION ARTERIAL: 90.8 % — ABNORMAL LOW (ref 94.0–100.0)
O2 SATURATION ARTERIAL: 91.3 % — ABNORMAL LOW (ref 94.0–100.0)
O2 SATURATION ARTERIAL: 92.4 % — ABNORMAL LOW (ref 94.0–100.0)
O2 SATURATION ARTERIAL: 92.7 % — ABNORMAL LOW (ref 94.0–100.0)
O2 SATURATION ARTERIAL: 99.3 % (ref 94.0–100.0)
PCO2 ARTERIAL: 50.7 mmHg — ABNORMAL HIGH (ref 35.0–45.0)
PCO2 ARTERIAL: 50.9 mmHg — ABNORMAL HIGH (ref 35.0–45.0)
PCO2 ARTERIAL: 51.3 mmHg — ABNORMAL HIGH (ref 35.0–45.0)
PCO2 ARTERIAL: 54 mmHg — ABNORMAL HIGH (ref 35.0–45.0)
PCO2 ARTERIAL: 54.1 mmHg — ABNORMAL HIGH (ref 35.0–45.0)
PCO2 ARTERIAL: 65.6 mmHg (ref 35.0–45.0)
PH ARTERIAL: 7.22 — ABNORMAL LOW (ref 7.35–7.45)
PH ARTERIAL: 7.3 — ABNORMAL LOW (ref 7.35–7.45)
PH ARTERIAL: 7.31 — ABNORMAL LOW (ref 7.35–7.45)
PH ARTERIAL: 7.34 — ABNORMAL LOW (ref 7.35–7.45)
PO2 ARTERIAL: 139 mmHg — ABNORMAL HIGH (ref 80.0–110.0)
PO2 ARTERIAL: 65.2 mmHg — ABNORMAL LOW (ref 80.0–110.0)
PO2 ARTERIAL: 66.5 mmHg — ABNORMAL LOW (ref 80.0–110.0)
PO2 ARTERIAL: 68.5 mmHg — ABNORMAL LOW (ref 80.0–110.0)
PO2 ARTERIAL: 69.8 mmHg — ABNORMAL LOW (ref 80.0–110.0)
PO2 ARTERIAL: 79.1 mmHg — ABNORMAL LOW (ref 80.0–110.0)
PO2 ARTERIAL: 81.1 mmHg (ref 80.0–110.0)
POTASSIUM WHOLE BLOOD: 4.1 mmol/L (ref 3.4–4.6)
POTASSIUM WHOLE BLOOD: 4.2 mmol/L (ref 3.4–4.6)
POTASSIUM WHOLE BLOOD: 4.5 mmol/L (ref 3.4–4.6)
POTASSIUM WHOLE BLOOD: 4.6 mmol/L (ref 3.4–4.6)
POTASSIUM WHOLE BLOOD: 4.6 mmol/L (ref 3.4–4.6)
POTASSIUM WHOLE BLOOD: 4.6 mmol/L (ref 3.4–4.6)
POTASSIUM WHOLE BLOOD: 4.6 mmol/L (ref 3.4–4.6)
SODIUM WHOLE BLOOD: 136 mmol/L (ref 135–145)
SODIUM WHOLE BLOOD: 137 mmol/L (ref 135–145)
SODIUM WHOLE BLOOD: 137 mmol/L (ref 135–145)
SODIUM WHOLE BLOOD: 137 mmol/L (ref 135–145)
SODIUM WHOLE BLOOD: 138 mmol/L (ref 135–145)
SODIUM WHOLE BLOOD: 138 mmol/L (ref 135–145)

## 2020-07-14 LAB — PLATELET COUNT
Platelets:NCnc:Pt:Bld:Qn:Automated count: 106 — ABNORMAL LOW
Platelets:NCnc:Pt:Bld:Qn:Automated count: 106 — ABNORMAL LOW

## 2020-07-14 LAB — HEPARIN CORRELATION
Lab: 0.2
Lab: 0.2

## 2020-07-14 LAB — SODIUM: Sodium:SCnc:Pt:Ser/Plas:Qn:: 138

## 2020-07-14 LAB — CBC
HEMATOCRIT: 23.8 % — ABNORMAL LOW (ref 36.0–46.0)
HEMATOCRIT: 24.1 % — ABNORMAL LOW (ref 36.0–46.0)
HEMOGLOBIN: 7.5 g/dL — ABNORMAL LOW (ref 12.0–16.0)
HEMOGLOBIN: 7.5 g/dL — ABNORMAL LOW (ref 12.0–16.0)
HEMOGLOBIN: 7.5 g/dL — ABNORMAL LOW (ref 12.0–16.0)
MEAN CORPUSCULAR HEMOGLOBIN CONC: 31.4 g/dL (ref 31.0–37.0)
MEAN CORPUSCULAR HEMOGLOBIN CONC: 31 g/dL (ref 31.0–37.0)
MEAN CORPUSCULAR HEMOGLOBIN: 29.1 pg (ref 26.0–34.0)
MEAN CORPUSCULAR HEMOGLOBIN: 29 pg (ref 26.0–34.0)
MEAN CORPUSCULAR HEMOGLOBIN: 29.2 pg (ref 26.0–34.0)
MEAN CORPUSCULAR VOLUME: 93.6 fL (ref 80.0–100.0)
MEAN CORPUSCULAR VOLUME: 93.7 fL (ref 80.0–100.0)
MEAN PLATELET VOLUME: 9.3 fL (ref 7.0–10.0)
MEAN PLATELET VOLUME: 9.7 fL (ref 7.0–10.0)
PLATELET COUNT: 106 10*9/L — ABNORMAL LOW (ref 150–440)
PLATELET COUNT: 128 10*9/L — ABNORMAL LOW (ref 150–440)
PLATELET COUNT: 139 10*9/L — ABNORMAL LOW (ref 150–440)
RED BLOOD CELL COUNT: 2.58 10*12/L — ABNORMAL LOW (ref 4.00–5.20)
RED BLOOD CELL COUNT: 2.56 10*12/L — ABNORMAL LOW (ref 4.00–5.20)
RED CELL DISTRIBUTION WIDTH: 15.1 % — ABNORMAL HIGH (ref 12.0–15.0)
RED CELL DISTRIBUTION WIDTH: 15.2 % — ABNORMAL HIGH (ref 12.0–15.0)
RED CELL DISTRIBUTION WIDTH: 15.5 % — ABNORMAL HIGH (ref 12.0–15.0)
WBC ADJUSTED: 16.6 10*9/L — ABNORMAL HIGH (ref 4.5–11.0)
WBC ADJUSTED: 15.9 10*9/L — ABNORMAL HIGH (ref 4.5–11.0)

## 2020-07-14 LAB — PHOSPHORUS
Phosphate:MCnc:Pt:Ser/Plas:Qn:: 2.1 — ABNORMAL LOW
Phosphate:MCnc:Pt:Ser/Plas:Qn:: 2.9
Phosphate:MCnc:Pt:Ser/Plas:Qn:: 3.1

## 2020-07-14 LAB — PH ARTERIAL: pH:LsCnc:Pt:BldA:Qn:: 7.34 — ABNORMAL LOW

## 2020-07-14 LAB — FIO2 ARTERIAL

## 2020-07-14 LAB — HCO3 ARTERIAL: Bicarbonate:SCnc:Pt:BldA:Qn:: 24

## 2020-07-14 LAB — HEMATOCRIT: Hematocrit:VFr:Pt:Bld:Qn:: 24 — ABNORMAL LOW

## 2020-07-14 LAB — PCO2 ARTERIAL: Carbon dioxide:PPres:Pt:BldA:Qn:: 64.1 — ABNORMAL HIGH

## 2020-07-14 LAB — ALBUMIN: Albumin:MCnc:Pt:Ser/Plas:Qn:: 1.6 — ABNORMAL LOW

## 2020-07-14 LAB — CO2: Carbon dioxide:SCnc:Pt:Ser/Plas:Qn:: 28

## 2020-07-14 LAB — D-DIMER QUANTITATIVE (CW,ML,HL): Lab: 1565 — ABNORMAL HIGH

## 2020-07-14 LAB — HEMOGLOBIN: Hemoglobin:MCnc:Pt:Bld:Qn:: 7.5 — ABNORMAL LOW

## 2020-07-14 LAB — VANCOMYCIN RANDOM: Vancomycin^random:MCnc:Pt:Ser/Plas:Qn:: 13.6

## 2020-07-14 LAB — SPECIMEN SOURCE

## 2020-07-14 LAB — TACROLIMUS, TROUGH: Lab: 3.4 — ABNORMAL LOW

## 2020-07-14 MED ADMIN — NxStage RFP 401 (+/- BB) 5000 mL - contains 4 mEq/L of potassium dialysis solution 5,000 mL: 5000 mL | INTRAVENOUS_CENTRAL | @ 06:00:00

## 2020-07-14 MED ADMIN — levothyroxine (SYNTHROID) tablet 100 mcg: 100 ug | GASTROENTERAL | @ 10:00:00

## 2020-07-14 MED ADMIN — CISATRacurium (NIMBEX) 200 mg/100 mL (2 mg/mL) continuous infusion: 0-10 ug/kg/min | INTRAVENOUS | @ 18:00:00

## 2020-07-14 MED ADMIN — metoclopramide (REGLAN) injection 10 mg: 10 mg | INTRAVENOUS | @ 16:00:00

## 2020-07-14 MED ADMIN — tacrolimus (PROGRAF) oral suspension: 1 mg | GASTROENTERAL | @ 13:00:00

## 2020-07-14 MED ADMIN — buPROPion (WELLBUTRIN) tablet 100 mg: 100 mg | GASTROENTERAL | @ 13:00:00

## 2020-07-14 MED ADMIN — escitalopram oxalate (LEXAPRO) tablet 20 mg: 20 mg | GASTROENTERAL | @ 13:00:00

## 2020-07-14 MED ADMIN — norepinephrine 8 mg in sodium chloride 0.9 % 250 mL (32mcg/mL) infusion PMB: 0-30 ug/min | INTRAVENOUS | @ 17:00:00

## 2020-07-14 MED ADMIN — heparin (porcine) 7,500 units/0.75 mL syringe: 7500 [IU] | SUBCUTANEOUS | @ 18:00:00

## 2020-07-14 MED ADMIN — pravastatin (PRAVACHOL) tablet 40 mg: 40 mg | GASTROENTERAL | @ 13:00:00

## 2020-07-14 MED ADMIN — LORazepam (ATIVAN) tablet 8 mg: 8 mg | GASTROENTERAL

## 2020-07-14 MED ADMIN — HYDROmorphone 1 mg/mL in 0.9% sodium chloride: 0-12 mg/h | INTRAVENOUS | @ 08:00:00 | Stop: 2020-07-14

## 2020-07-14 MED ADMIN — LORazepam (ATIVAN) tablet 8 mg: 8 mg | GASTROENTERAL | @ 20:00:00

## 2020-07-14 MED ADMIN — NxStage RFP 401 (+/- BB) 5000 mL - contains 4 mEq/L of potassium dialysis solution 5,000 mL: 5000 mL | INTRAVENOUS_CENTRAL | @ 19:00:00

## 2020-07-14 MED ADMIN — HYDROmorphone 1 mg/mL in 0.9% sodium chloride: 0-12 mg/h | INTRAVENOUS | @ 22:00:00 | Stop: 2020-07-16

## 2020-07-14 MED ADMIN — sennosides (SENOKOT) oral syrup: 10 mL | GASTROENTERAL

## 2020-07-14 MED ADMIN — oxyCODONE (ROXICODONE) immediate release tablet 60 mg: 60 mg | GASTROENTERAL | @ 13:00:00 | Stop: 2020-07-17

## 2020-07-14 MED ADMIN — NxStage RFP 401 (+/- BB) 5000 mL - contains 4 mEq/L of potassium dialysis solution 5,000 mL: 5000 mL | INTRAVENOUS_CENTRAL | @ 11:00:00

## 2020-07-14 MED ADMIN — chlorhexidine (PERIDEX) 0.12 % solution 5 mL: 5 mL | OROMUCOSAL | @ 12:00:00

## 2020-07-14 MED ADMIN — insulin NPH (HumuLIN,NovoLIN) injection 5 Units: 5 [IU] | SUBCUTANEOUS | @ 22:00:00

## 2020-07-14 MED ADMIN — tacrolimus (PROGRAF) oral suspension: 1 mg | GASTROENTERAL

## 2020-07-14 MED ADMIN — white petrolatum-mineral oiL (SOOTHE PM) 80-20 % ophthalmic ointment 1 application: 1 | OPHTHALMIC | @ 13:00:00

## 2020-07-14 MED ADMIN — polyethylene glycol (MIRALAX) packet 17 g: 17 g | GASTROENTERAL

## 2020-07-14 MED ADMIN — oxyCODONE (ROXICODONE) immediate release tablet 60 mg: 60 mg | GASTROENTERAL | @ 07:00:00 | Stop: 2020-07-17

## 2020-07-14 MED ADMIN — NxStage RFP 401 (+/- BB) 5000 mL - contains 4 mEq/L of potassium dialysis solution 5,000 mL: 5000 mL | INTRAVENOUS_CENTRAL | @ 01:00:00

## 2020-07-14 MED ADMIN — NxStage RFP 401 (+/- BB) 5000 mL - contains 4 mEq/L of potassium dialysis solution 5,000 mL: 5000 mL | INTRAVENOUS_CENTRAL

## 2020-07-14 MED ADMIN — heparin (porcine) 7,500 units/0.75 mL syringe: 7500 [IU] | SUBCUTANEOUS | @ 10:00:00

## 2020-07-14 MED ADMIN — midazolam in sodium chloride 0.9% (1 mg/mL) infusion PMB: 0-12 mg/h | INTRAVENOUS | @ 10:00:00 | Stop: 2020-07-14

## 2020-07-14 MED ADMIN — polyethylene glycol (MIRALAX) packet 17 g: 17 g | GASTROENTERAL | @ 18:00:00

## 2020-07-14 MED ADMIN — LORazepam (ATIVAN) tablet 8 mg: 8 mg | GASTROENTERAL | @ 13:00:00

## 2020-07-14 MED ADMIN — oxyCODONE (ROXICODONE) immediate release tablet 60 mg: 60 mg | GASTROENTERAL | @ 09:00:00 | Stop: 2020-07-17

## 2020-07-14 MED ADMIN — oxyCODONE (ROXICODONE) immediate release tablet 60 mg: 60 mg | GASTROENTERAL | @ 22:00:00 | Stop: 2020-07-17

## 2020-07-14 MED ADMIN — QUEtiapine (SEROquel) tablet 50 mg: 50 mg | GASTROENTERAL | @ 13:00:00

## 2020-07-14 MED ADMIN — metoclopramide (REGLAN) injection 10 mg: 10 mg | INTRAVENOUS | @ 03:00:00

## 2020-07-14 MED ADMIN — insulin NPH (HumuLIN,NovoLIN) injection 8 Units: 8 [IU] | SUBCUTANEOUS | @ 10:00:00 | Stop: 2020-07-14

## 2020-07-14 MED ADMIN — midazolam in sodium chloride 0.9% (1 mg/mL) infusion PMB: 0-12 mg/h | INTRAVENOUS | @ 18:00:00

## 2020-07-14 MED ADMIN — famotidine (PEPCID) tablet 20 mg: 20 mg | GASTROENTERAL | @ 13:00:00

## 2020-07-14 MED ADMIN — CISATRacurium (NIMBEX) 200 mg/100 mL (2 mg/mL) continuous infusion: 0-10 ug/kg/min | INTRAVENOUS | @ 03:00:00

## 2020-07-14 MED ADMIN — LORazepam (ATIVAN) tablet 8 mg: 8 mg | GASTROENTERAL | @ 03:00:00

## 2020-07-14 MED ADMIN — magnesium sulfate 2gm/50mL IVPB: 2 g | INTRAVENOUS | @ 13:00:00 | Stop: 2020-07-14

## 2020-07-14 MED ADMIN — NxStage RFP 401 (+/- BB) 5000 mL - contains 4 mEq/L of potassium dialysis solution 5,000 mL: 5000 mL | INTRAVENOUS_CENTRAL | @ 15:00:00

## 2020-07-14 MED ADMIN — white petrolatum-mineral oiL (SOOTHE PM) 80-20 % ophthalmic ointment 1 application: 1 | OPHTHALMIC | @ 02:00:00

## 2020-07-14 MED ADMIN — cefepime (MAXIPIME) 2 g in dextrose 100 mL IVPB (premix): 2 g | INTRAVENOUS | @ 15:00:00 | Stop: 2020-07-17

## 2020-07-14 MED ADMIN — metoclopramide (REGLAN) injection 10 mg: 10 mg | INTRAVENOUS | @ 22:00:00

## 2020-07-14 MED ADMIN — HYDROmorphone 1 mg/mL in 0.9% sodium chloride: 0-12 mg/h | INTRAVENOUS | @ 03:00:00 | Stop: 2020-07-16

## 2020-07-14 MED ADMIN — vancomycin (VANCOCIN) 1500 mg in sodium chloride (NS) 0.9 % 500 mL IVPB (premix): 1500 mg | INTRAVENOUS | @ 16:00:00 | Stop: 2020-07-14

## 2020-07-14 MED ADMIN — metoclopramide (REGLAN) injection 10 mg: 10 mg | INTRAVENOUS | @ 09:00:00

## 2020-07-14 MED ADMIN — HYDROmorphone 1 mg/mL in 0.9% sodium chloride: 0-12 mg/h | INTRAVENOUS | @ 12:00:00 | Stop: 2020-07-16

## 2020-07-14 MED ADMIN — CISATRacurium (NIMBEX) 200 mg/100 mL (2 mg/mL) continuous infusion: 0-10 ug/kg/min | INTRAVENOUS | @ 23:00:00

## 2020-07-14 MED ADMIN — buPROPion (WELLBUTRIN) tablet 100 mg: 100 mg | GASTROENTERAL | @ 18:00:00

## 2020-07-14 MED ADMIN — oxyCODONE (ROXICODONE) immediate release tablet 60 mg: 60 mg | GASTROENTERAL | @ 18:00:00 | Stop: 2020-07-17

## 2020-07-14 MED ADMIN — heparin (porcine) 7,500 units/0.75 mL syringe: 7500 [IU] | SUBCUTANEOUS | @ 02:00:00

## 2020-07-14 MED ADMIN — norepinephrine 8 mg in sodium chloride 0.9 % 250 mL (32mcg/mL) infusion PMB: 0-30 ug/min | INTRAVENOUS | @ 16:00:00

## 2020-07-14 MED ADMIN — LORazepam (ATIVAN) tablet 8 mg: 8 mg | GASTROENTERAL | @ 16:00:00

## 2020-07-14 MED ADMIN — cefepime (MAXIPIME) 2 g in dextrose 100 mL IVPB (premix): 2 g | INTRAVENOUS | @ 02:00:00 | Stop: 2020-07-17

## 2020-07-14 MED ADMIN — QUEtiapine (SEROquel) tablet 50 mg: 50 mg | GASTROENTERAL | @ 18:00:00

## 2020-07-14 MED ADMIN — oxyCODONE (ROXICODONE) immediate release tablet 60 mg: 60 mg | GASTROENTERAL | @ 02:00:00 | Stop: 2020-07-17

## 2020-07-14 MED ADMIN — polyethylene glycol (MIRALAX) packet 17 g: 17 g | GASTROENTERAL | @ 13:00:00

## 2020-07-14 MED ADMIN — buPROPion (WELLBUTRIN) tablet 100 mg: 100 mg | GASTROENTERAL

## 2020-07-14 NOTE — Unmapped (Signed)
MICU Evening Summary     Date of Service: 07/13/2020    Interval History: Evelyn Rollins is a 54 y.o. 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 ??Pt was intubated for hypoxic respiratory failure. Critical care services are indicated for AHRF r/t covid pna, AKI, ileus, VAP.       Assessment & Plan     Neuro: cont scheduled seroquel/ativan/oxy w dilaudid/versed gtts and cisat. TOF 4/4 yet vent sync and oxygenation unchanged after vec boluses. If dyssync then plan to switch cisat to vec gtt, Significant orbital edema, proned since 8/2 1630, only able to tolerate 6hrs supine today, unable to assess pupils, no evid sz, last eeg no evid sz, cont keppra per neuro based on MRI findings (rt hippocampus lesion c/f possible sz focus?), BIS monitor not sticking well for accurate read  Pulm: cont prvc, wean fio2 for pao2 >65, DPs remain high at 20 w 51ml/kg for persistent hypercapnea, tolerating swims well, plan to supinate in am and f/u cxr   RU:EAVW levo for MAP>65, QTc 384  Renal: CRRT at 10 UF, unable to remove more volume BP limited (goal max levo 10), consider prefilter heparin if circuit clots again, discuss w renal in am  GI: tol TF well on reglan, cont bowel regimen, FMS in situ  Heme: cont heparin group C, plan to in if ddimer >5000 FEU, T/S in am  Endo: cont nph 8 q12 plus ADI  ID/Immuno: course completed for MSSA pna yet started on vanc, cefep tod for increasing wbc, wbc now downtrend, afebrile, cont prograf, f/u level in am  Goals of care: ongoing    Critical Care Attestation     This patient is critically ill or injured with the impairment of vital organ systems such that there is a high probability of imminent or life threatening deterioration in the patient's condition. This patient must remain in the ICU for ongoing evaluation of the comprehensive management plan outlined in this note. I directly provided critical care services as documented in this note and the critical care time spent (45 min) is exclusive of separately billable procedures.    Justin Meisenheimer Fonnie Mu, ACNP

## 2020-07-14 NOTE — Unmapped (Signed)
Continuous Renal Replacement  Dialysis Nurse Therapy Procedure Note    Treatment Type:  [facility] Number Of Days On Therapy:  - Procedure Date:  07/13/2020 9:21 PM     TREATMENT STATUS:  Restarted  Patient and Treatment Status     None          Active Dialysis Orders (168h ago, onward)     Start     Ordered    07/09/20 1620  CRRT Orders - NxStage (Adult)  Continuous     Comments: Fluid Removal Rate parameters:  MAP   <78mmHg 10 mL/hr;  MAP   >65 mmHg 125 mL/hr;   Question Answer Comment   CRRT System: NxStage    Modality: CVVH    Access: Left Internal Jugular    BFR (mL/min): 200-350    Dialysate Flow Rate (mL/kg/hr): Other (Specify) 2.7L       07/09/20 1622              SYSTEM CHECK:  Machine Name: [id number]  Dialyzer: [id number]   Self Test Completed: Yes.        Alarms Connected To The Wall And Active:  Yes.    VITAL SIGNS:  Temp:  [34.7 ??C (94.4 ??F)-37.2 ??C (99 ??F)] 37.2 ??C (99 ??F)  Heart Rate:  [77-105] 99  SpO2 Pulse:  [77-104] 100  Resp:  [23-33] 33  SpO2:  [87 %-96 %] 93 %  A BP-2: (89-145)/(31-70) 123/53  MAP:  [46 mmHg-88 mmHg] 70 mmHg    ACCESS SITE:        Hemodialysis Catheter With Distal Infusion Port 07/09/20 Left Internal jugular 1.4 mL 1.4 mL (Active)   Site Assessment Clean;Dry;Intact 07/13/20 2100   Proximal Lumen Status / Patency Blood Return - Brisk 07/13/20 2100   Proximal Lumen Intervention Accessed 07/13/20 2100   Medial Lumen Status / Patency Blood Return - Brisk 07/13/20 2100   Medial Lumen Intervention Accessed 07/13/20 2100   Lumen 3, Distal Status / Patency Saline locked 07/13/20 1600   Distal Lumen Intervention Flushed 07/13/20 0800   Exposed Catheter Length (cm) 0 cm 07/09/20 1700   IV Tubing and Needleless Injector Cap Change Due 07/14/20 07/13/20 1600   Dressing Type Hemostatic dressing;CHG gel;Occlusive;Transparent 07/13/20 1600   Dressing Status      Intact/not removed 07/13/20 2100   Dressing Intervention No intervention needed 07/13/20 2100   Contraindicated due to: Dressing Intact surrounding insertion site 07/13/20 2100   Dressing Change Due 08/09/2020 07/13/20 2100   Line Necessity Reviewed? Y 07/13/20 2100   Line Necessity Indications Yes - Hemodialysis 07/13/20 2100   Line Necessity Reviewed With MDI 07/13/20 0800          CATHETER FILL VOLUMES:     Arterial: 1.4 mL  Venous: 1.4 mL     Lab Results   Component Value Date    NA 137 07/13/2020    NA 137 07/13/2020    K 4.7 (H) 07/13/2020    K 4.7 (H) 07/13/2020    CL 106 07/13/2020    CO2 26.0 07/13/2020    BUN 17 07/13/2020     Lab Results   Component Value Date    CALCIUM 8.5 (L) 07/13/2020    CAION 5.05 07/13/2020    PHOS 3.1 07/13/2020    MG 1.9 07/13/2020        SETTINGS:  Blood Pump Rate: 350 mL/min  Replacement Fluid Rate:     Pre-Blood Pump Fluid Rate:    Hourly Fluid Removal Rate:  10 mL/hr   Dialysate Fluid Rate    Therapy Fluid Temperature:       ANTICOAGULANT:  None    ADDITIONAL COMMENTS:  None    HEMODIALYSIS ON-CALL NURSE PAGER NUMBER:  ?? Monday thru Saturday 0700 - 1730: Call the Dialysis Unit ext. [phone]   ?? After 1730 and all day Sunday: Call the Dialysis RN Pager Number [phone]     PROCEDURE REVIEW, VERIFICATION, HANDOFF:  CRRT settings verified, procedure reviewed, and instructions given to primary RN.     Primary CRRT RN Verifying: Peri Jefferson RN Dialysis RN Verifying: Rowland Lathe, RN

## 2020-07-14 NOTE — Unmapped (Signed)
IMMUNOCOMPROMISED HOST INFECTIOUS DISEASE PROGRESS NOTE    Evelyn Rollins is being seen in consultation at the request of Evelyn Rollins, * for evaluation of COVID-19 in transplant patient.    Assessment/Recommendations:    Evelyn Rollins is a 54 y.o. female with history of NAFLD s/p OLT in 2016, DM2 not on insulin (unknown A1c), seizures x1 in remote past and morbid obesity who presented to an OSH on 7/22 with 7 days of symptoms and profound hypoxia due to critical COVID-19 ARDS requiring intubation on 7/22, now with c/f secondary bacterial PNA w/ MSSA from positive sputum cultures on 7/24. Coverage broadened 8/1 after increasing WBC and new pressor requirement concerning for worsening infection.    Unclear if recent change increased WBC and pressor requirement representative of worsening PNA as CXRs difficult to interpret. However had responded to broadened antibiotics to some degree so may be infectious source not identified that is now being adequately covered.    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   - Surgical complications: biliary anastomosis leak with pelvic collection, s/p abx  - Serologies: CMV D-/R+, EBV D+/R+, Toxo D?/R-  - Induction: basiliximab  - Immunosuppression: myfortic, tac  - Prophylaxis: none    Pertinent Exposure History  Donor Infections: Donor death from drowning     Pertinent Co-morbidities  # Morbid obesity  # Hypothyroidism  # DM - unknown A1c  # Depression/anxiety  # Seizure x1 in remote past    Drug Intolerances - none     Infectious Diseases Problem List:  # Critical SARS CoV-2 infection with ARDS requiring intubation and w/ c/f MSSA PNA (7/22) in unvaccinated patient, onset 06/24/20, WBC and new pressor requirement 07/12/2020  -Known exposure(s): daughter, son-in-law on 7/10  -Date of symptom onset : 7/14  -Date of diagnostic test: 7/17 (home test), 7/22 at OSH  -Remdesivir administered: 7/22- 7/25  -Steroids administered: dexamethasone 7/22-7/30 -CCP 1U or 2U administered: n/a  -Monoclonal antibody administered: n/a  -Tocilizumab administered: n/a  -07/04/20 Lower resp Cx: 3+ MSSA  -07/12/20 Uptrending WBC and pressors restarted with sputum Cx: 1+ GPCs, 1+ GPRs  - Prior Abx: Cefepime & Vancomycin 7/25-7/26, cefazolin 7/27-7/30  - Current Abx: Cefepime & Vancomycin 8/1-    S Epi bacteremia (MRSE), S hominis 07/04/20 favored as contaminants  Favored as a contaminant as only growing 1 of 2 bottles from 7/24.  -07/04/20 BC 1 of 2 w/ MRSE  -07/06/20 BC x 2 negative       RECOMMENDATIONS FOR 07/14/2020    Diagnostic  ??? F/u 8/1 BCx for final read  ??? F/u 8/1 Sputum Cx for organism ID  ??? F/u 8/2 MRSA screen    Treatment  ??? OK to continue broad spectrum coverage for 5-7d for presumed VAP PNA w/wo additional infectious source per discussion with primary team  ??? Would DC vanc if patient remains stable over next 24 hours and MRSA screen or sputum culture remains negative          The [facility] ID service will continue to follow from afar. Please page the ID Transplant/Liquid Oncology Fellow consult at [phone] with questions. Patient discussed with Dr. Reynold Bowen.    Karlton Lemon, MD, PhD  Infectious Diseases Fellow    Interim History:      WBC stable today and pressor and vent needs continue to show mild improvement. Remains afebrile.    Allergies:  No Known Allergies    Medications:   Antimicrobials:  Anti-infectives (From admission, onward)  Start     Dose/Rate Route Frequency Ordered Stop    07/15/20 1000  vancomycin (VANCOCIN) 2000 mg IVPB in 500 mL sodium chloride 0.9% (premix)      2,000 mg  250 mL/hr over 120 Minutes Intravenous Every 24 hours 07/14/20 1224 07/17/20 0959    07/12/20 1100  cefepime (MAXIPIME) 2 g in dextrose 100 mL IVPB (premix)      2 g  200 mL/hr over 30 Minutes Intravenous Every 12 hours 07/12/20 1011 07/17/20 1059          Current/Prior immunomodulators:  Dexamethasone 6mg  7/22-7/30  Myfortic  Tac    Other medications reviewed.     Review of Systems: Unable to perform due to patient condition        Vital Signs last 24 hours:  Temp:  [35.9 ??C-37.2 ??C] 36.5 ??C  Heart Rate:  [80-105] 97  SpO2 Pulse:  [81-104] 97  Resp:  [23-33] 33  A BP-2: (89-142)/(40-62) 108/40  MAP:  [21 mmHg-84 mmHg] 21 mmHg  FiO2 (%):  [75 %-100 %] 90 %  SpO2:  [87 %-100 %] 95 %    Physical Exam:  Patient Lines/Drains/Airways Status    Active Active Lines, Drains, & Airways     Name:   Placement date:   Placement time:   Site:   Days:    ETT  7.5   18-Jul-2020    1700     11    CVC Triple Lumen 2020/07/18 Non-tunneled Right Internal jugular   07/18/20    1945    Internal jugular   11    Hemodialysis Catheter With Distal Infusion Port 07/09/20 Left Internal jugular 1.4 mL 1.4 mL   07/09/20    1700    Internal jugular   4    NG/OG Tube Decompression;Feedings Center mouth   07/03/20    0610    Center mouth   11    Urethral Catheter   07/18/20    ???    ???   12    Arterial Line 07/18/2020 Right Radial   18-Jul-2020    2315    Radial   11              Physical exam deferred for today due to COVID positive status.    Data for Medical Decision Making  [facility] )     Recent Labs   Lab Units 07/14/20  1213 07/14/20  0853 07/14/20  0539 07/14/20  0305 07/13/20  2306 07/13/20  2306 07/13/20  1943 07/13/20  1943 07/13/20  1657 07/13/20  1657 07/13/20  1255 07/13/20  1255 07/13/20  0855 07/13/20  0855 07/13/20  0534 07/13/20  0534 07/13/20  0009 07/12/20  1933 07/12/20  1933 07/12/20  1523 07/12/20  1117 07/12/20  0803 07/09/20  0737 07/09/20  0354 07/08/20  0744 07/08/20  0332   WBC 10*9/L 16.6*  --   --  16.0* - 16.0*  --   --   --  19.5*  --   --   --  14.1*  --   --   --  17.2*  --    < > 19.4*  --    < > 27.0*   < > 18.0*  --  15.5*   HEMOGLOBIN g/dL 7.5*  --   --  7.5* - 7.5*  --   --   --  8.2*  --   --   --  8.0*  --   --   --  8.7*  --    < > 9.3*  --    < > 10.4*   < > 11.6*  --  11.0*   HEMOGLOBIN BG   --   --   --   --   --   --   --   --   --   --   --   --   --   --   --   --   --   --   --   -- --   --    < >  --   --   --    PLATELET COUNT (1) 10*9/L 128*  --   --  106* - 106*  --   --   --  124*  --   --   --  104*  --   --   --  123*  --    < > 153  --    < > 247   < > 496*  --  473*   NEUTRO ABS 10*9/L  --   --   --  14.9*  --   --   --   --   --   --   --   --   --   --   --  16.0*  --   --   --   --   --  25.0*  --  16.8*  --  14.5*   LYMPHO ABS 10*9/L  --   --   --  0.6*  --   --   --   --   --   --   --   --   --   --   --  0.6*  --   --   --   --   --  0.9*  --  0.5*  --  0.5*   EOSINO ABS 10*9/L  --   --   --  0.2  --   --   --   --   --   --   --   --   --   --   --  0.2  --   --   --   --   --  0.5*  --  0.0  --  0.0   BUN mg/dL  --   --   --  14  --   --   --  17  --   --   --  18  --   --   --  22  --   --  24*  --    < > 32*   < > 138*   < > 117*   CREATININE mg/dL  --   --   --  1.61*  --   --   --  1.27*  --   --   --  1.26*  --   --   --  1.37*  --   --  1.45*  --    < > 1.66*   < > 4.73*   < > 4.11*   AST U/L  --   --   --  36*  --   --   --  40*  --   --   --  38*  --   --   --  37*  --   --  39*  --    < > 35*   < > 52*   < >  45*   ALT U/L  --   --   --  <7*  --   --   --  <7*  --   --   --  <7*  --   --   --  <7*  --   --  <7*  --    < > 10   < > 18   < > 42   BILIRUBIN TOTAL mg/dL  --   --   --  0.2*  --   --   --  0.3  --   --   --  0.3  --   --   --  0.2*  --   --  0.2*  --    < > 0.2*   < > 0.2*   < > <0.2*   ALK PHOS U/L  --   --   --  58  --   --   --  67  --   --   --  54  --   --   --  62  --   --  60  --    < > 65   < > 60   < > 62   POTASSIUM WHOLE BLOOD mmol/L 4.2 4.1 4.6 4.6   < > 4.9*   < > 4.7*   < > 4.3   < > 4.3   < > 4.2   < > 4.6 4.6   < > 4.7*   < >   < > 4.6   < > 5.1*   < >  --    POTASSIUM mmol/L  --   --   --  4.7*  --   --   --  4.7*  --   --    < > 4.5   < >  --   --  4.5  --    < > 4.8*  --    < > 4.7*   < > 5.2*   < > 4.7*   MAGNESIUM mg/dL  --  1.8  --   --   --  1.7  --   --   --  1.9  --   --   --  1.9  --   --  1.7  --   --    < >   < > 1.7   < > 2.7* --  2.5   PHOSPHORUS mg/dL  --  2.1*  --   --   --  3.1  --   --   --  3.1  --   --   --  2.9  --   --  3.4  --   --    < >   < > 2.8   < > 6.0*  --  3.5   CALCIUM mg/dL  --   --   --  8.4*  --   --   --  8.5*  --   --   --  8.2*  --   --   --  8.3*  --   --  8.2*  --    < > 8.1*   < > 8.5*   < > 8.3*    < > = values in this interval not displayed.       Drug Level   Results in Past 360 Days  Result Component Current Result   Vancomycin Rm 13.6 (07/14/2020)   Tacrolimus, Trough 3.2 (L) (07/13/2020)  New Culture Data   Microbiology Results (last day)     Procedure Component Value Date/Time Date/Time    Lower Respiratory Culture [[id number]] Collected: 07/12/20 1142    Lab Status: Preliminary result Specimen: Aspirate, Tracheal from Tracheal aspirate Updated: 07/14/20 1335     Lower Respiratory Culture OROPHARYNGEAL FLORA ISOLATED     Gram Stain 10-25 PMNS/LPF      <10  Epithelial cells/LPF      1+ Gram positive cocci      1+ Gram positive bacilli      Acceptable for culture    Narrative:      Specimen Source: Tracheal aspirate    MRSA Screen [[id number]] Collected: 07/13/20 2956    Lab Status: Final result Specimen: Nares from Nose Updated: 07/14/20 1317     MRSA Screen NOT DETECTED    Narrative:      Specimen Source: Nose    Blood Culture, Adult [[id number]] Collected: 07/12/20 0853    Lab Status: Preliminary result Specimen: Blood from 1 Peripheral Draw Updated: 07/14/20 0930     Blood Culture, Routine No Growth at 48 hours    Blood Culture, Adult [[id number]] Collected: 07/12/20 0853    Lab Status: Preliminary result Specimen: Blood from 1 Peripheral Draw Updated: 07/14/20 0930     Blood Culture, Routine No Growth at 48 hours        Recent Studies   No results found.

## 2020-07-14 NOTE — Unmapped (Signed)
Tacrolimus Therapeutic Monitoring Pharmacy Note    Melanee Cordial is a 54 y.o. female continuing tacrolimus.     Indication: Liver transplant     Date of Transplant: 06/2015      Prior Dosing Information: Current regimen 1mg  BID      Goals:  Therapeutic Drug Levels  Tacrolimus trough goal: 3-5 ng/mL    Additional Clinical Monitoring/Outcomes  ?? Monitor renal function (SCr and urine output) and liver function (LFTs)  ?? Monitor for signs/symptoms of adverse events (e.g., hyperglycemia, hyperkalemia, hypomagnesemia, hypertension, headache, tremor)    Results:   Tacrolimus level: 3.4 ng/mL, drawn appropriately    Pharmacokinetic Considerations and Significant Drug Interactions:  ??? Concurrent hepatotoxic medications: None identified  ??? Concurrent CYP3A4 substrates/inhibitors: None identified  ??? Concurrent nephrotoxic medications: None identified    Assessment/Plan:  Recommendedation(s)  ??? Continue current regimen of 1mg  BID    Follow-up  ??? Daily levels until stable.   ??? A pharmacist will continue to monitor and recommend levels as appropriate    Please page service pharmacist with questions/clarifications.    Charleen Kirks, PharmD

## 2020-07-14 NOTE — Unmapped (Signed)
Vancomycin Therapeutic Monitoring Pharmacy Note    Evelyn Rollins is a 54 y.o. female starting vancomycin. Date of therapy initiation: 07/12/20    Indication: Bacteremia/Sepsis    Prior Dosing Information: Current regimen 1500mg  every 24 hours      Goals:  Therapeutic Drug Levels  Vancomycin trough goal: 15-20 mg/L    Additional Clinical Monitoring/Outcomes  Renal function, volume status (intake and output)    Results: Vancomycin level 13.6 mg/L, drawn appropriately    Wt Readings from Last 1 Encounters:   07/09/20 (!) 108 kg (238 lb 1.6 oz)     Creatinine   Date Value Ref Range Status   07/14/2020 1.25 (H) 0.60 - 0.80 mg/dL Final   16/09/9603 5.40 (H) 0.60 - 0.80 mg/dL Final   98/10/9146 8.29 (H) 0.60 - 0.80 mg/dL Final        Pharmacokinetic Considerations and Significant Drug Interactions:  ??? Per linear dose adjustment  ??? Concurrent nephrotoxic meds: not applicable    Assessment/Plan:  Recommendation(s)  ??? Change current regimen to vancomycin 2000mg  every 24 hours  ??? Estimated trough on recommended regimen: 15-20 mg/L    Follow-up  ??? Level due: in 3-5 days  ??? A pharmacist will continue to monitor and order levels as appropriate    Please page service pharmacist with questions/clarifications.    Charleen Kirks, PharmD

## 2020-07-14 NOTE — Unmapped (Signed)
MICU Daily Progress Note     Date of Service: 07/14/2020    Problem List:   Principal Problem:    Acute hypoxemic respiratory failure due to severe acute respiratory syndrome coronavirus 2 (SARS-CoV-2) disease (CMS-HCC)  Active Problems:    Liver transplant recipient (CMS-HCC)    Morbid obesity with BMI of 40.0-44.9, adult (CMS-HCC)    Acute renal failure (ARF) (CMS-HCC)  Resolved Problems:    * No resolved hospital problems. *      Interval history:  Evelyn Rollins is a 54 y.o. female with PMH cirrhosis status post liver transplant 2016 on chronic immunosuppressive therapy, hypertension, diabetes, hypothyroidism who presented to [facility] on 7/22 with myalgias, sore throat, loss of taste and fevers  x5 days. When her symptoms had started, she took a home Covid test on 7/17 which was found to be positive. She has had poor p.o. intake for the last several days. Her husband also has similar symptoms. In the OSH ED she was found to be severely hypoxic with O2 saturations of 50% on room air. She was placed on a nonrebreather and current saturations increased to the low 90s. Chest x-ray shows bilateral infiltrates consistent with COVID-19 pneumonia. Her Covid test is positive. Inflammatory markers are elevated. She received a dose of Decadron in the emergency room.    24hr events:   -supinated for 4 hrs, reproned 8/2 at 430pm   - BP improved and levo weaned and able to increase UF     Neurological   Analgesia and Sedation  -continue dilaudid and versed gtt  -cis gtt (7/31 -   -goal RASS 3- to 4-  -PRN dilaudid and versed  -ativan 8q4h, oxy 60q4h  - seroquel 50 mg tid  ??  Anxiety, depression  -restarted home meds Wellbutrin/Lexapro, and Seroquel in place of abilify  ??  Seizure history (not taking AEDs), ?seizure activity  -neurology consult >> c/f RUE weakness, MRI 7/27- no acute pathology  -vEEG negative for seizure x 48hrs >> discont monitoring per neuro 7/25  - MRI with possible sz focus,  keppra 500 mg daily per neuro recommendations       Pulmonary   ARDS, Acute resp failure r/t COVID, pnuemomediastinum .  Intubated 7/22   -worsening oxygenation and airway pressures higher  - only able to be supinated for 4 hrs yesterday and reproned at 430pm.    - remains paralyzed and sedated  - lowered TV from 400 to 350 (8cc/kg->7cc/kg) with goal of 6cc/kg if tolerates     Cardiovascular   h/o HTN   - holding home antihtn  - last echo 2016 w nml LVEF, enlrgd RV and deprssed RV fxn w mild phtn  -continue statin     Hypotension  -continue levophed for MAP goal >65    Renal   ARF on CRRT:  - cont foley for accurate I/O during critical illness   - replete electrolytes prn and monitor daily  - nephrology following  - CRRT goal UF 150--> 200 hr   - goal = to neg     Infectious Disease/Autoimmune   COVID 19 infection  Symptom onset:??7/17  Exposure: unknown  COVID PCR+: 7/17  OSH Admission:??7/22  Day of admission/transfer: 7/22  OSH Meds Given:??dexamethasone  COVID Specific??Meds:??dexamethasone 7/22, remdesivir 7/22  Vaccination status: not vaccinated  ??  Monitoring:  - Special airborne/contact precautions (If unavailable, droplet & contact precautions)  - f/u daily CMP, CBC w/ diff, CRP, D-dimer, DIC, troponin, pro-BNP with weekly ferritin, LDH  and cytokine level  - f/u q6h lactate; ABG  ??  Medications:  - completed remdesivir  - cont decadron 6mg  po  daily x 10d, started 7/22  ??  CAP/VAP: MSSA   - febrile, leukocytosis   - started cefep/vanc 7/25--> changed to ancef on 7/27 x7 days  - pan cx for T>38.2   - wbc count rising, hypoxia worsening, broad spectrum abx (vanc/cefe) started 8/1    Cultures:  Blood Culture, Routine (no units)   Date Value   07/12/2020 No Growth at 48 hours   07/12/2020 No Growth at 48 hours     Lower Respiratory Culture (no units)   Date Value   07/12/2020 Too Young To Read; No Predominant Pathogen.   07/04/2020 3+ Methicillin-Susceptible Staphylococcus aureus (A)     WBC (10*9/L)   Date Value   07/14/2020 16.0 (H) 07/14/2020 16.0 (H)     WBC, UA (/HPF)   Date Value   07/12/2020 7 (H)          FEN/GI   -trickle tube feeds > high residuals, continue reglan  -bowel regimen w senna and miralax, FMS with stool  -PPI  ??  Immunocompromised s/p Liver transplant in 2016  -restarted tacro 1mg  q12  - f/u tacro level  -HOLD cellcept    Heme/Coag   Hypercoagulable state 2/2 covid  -anticoagulation group C heparin  -LE dopplers neg for DVT 7/24  -no evid bleeding    Endocrine   History of DM2  -insulin gtt stopped  -ISS  -NPH 8 bid    Hypothyroidism  -continue synthroid  ??  Integumentary   - WOCN consulted for high risk skin assessment Yes.  - WOCN recs >>Mepilex Xt cut to fit above new ET trial holder, proning protocol in place, interdry can be used to folds  - cont pressure mitigating precautions per skin policy    Prophylaxis/LDA/Restraints/Consults   Can CVC be removed? No: need for medications requiring central access (e.g. pressors)   Can A-line be removed? No: frequent ABGs  Can Foley be removed? No: Need continuous I/O  Mobility plan: Step 1 - Range of motion    Feeding: Trickle feeds, advance as tolerated  Analgesia: No pain issues  Sedation SAT/SBT: No PEEP > 8  Thromboembolic ppx: Enoxaparin  Head of bed >30 degrees: Yes  Ulcer ppx: Yes, coagulopathy  Glucose within target range: Yes, in range    Does patient need/have an active type/screen? NA    RASS at goal? Yes  [place] Agitation Assessment Scale (RASS) : -5 (07/14/2020 10:00 AM)     Can antipsychotics be stopped? N/A, not on antipsychotics  CAM-ICU Result: Positive (07/14/2020  8:00 AM)      Would hospice care be appropriate for this patient? No, patient improving or expected to improve    Patient Lines/Drains/Airways Status    Active Active Lines, Drains, & Airways     Name:   Placement date:   Placement time:   Site:   Days:    ETT  7.5   06/18/2020    1700     11    CVC Triple Lumen 06/29/2020 Non-tunneled Right Internal jugular   07/08/2020    1945    Internal jugular   11 Hemodialysis Catheter With Distal Infusion Port 07/09/20 Left Internal jugular 1.4 mL 1.4 mL   07/09/20    1700    Internal jugular   4    NG/OG Tube Decompression;Feedings Center mouth   07/03/20  1610    Center mouth   11    Urethral Catheter   07/01/2020    ???    ???   12    Arterial Line 06/15/2020 Right Radial   07/11/2020    2315    Radial   11              Patient Lines/Drains/Airways Status    Active Wounds     Name:   Placement date:   Placement time:   Site:   Days:    Wound 07/11/20 Pressure Injury Face Left cheek under prev ET tube holder- pt was proned DTI   07/11/20    1100    Face   2    Wound 07/13/20 Pressure Injury Nose from ET holder. pt was proned  DTI   07/13/20    1254    Nose   less than 1    Wound 07/13/20 Pressure Injury Other (Comment) Right cheek r/t proning DTI   07/13/20    1255    Other (Comment)   less than 1                Goals of Care     Code Status: Full Code    Designated Healthcare Decision Maker:  Ms. Abbasi current decisional capacity for healthcare decision-making is incapacitated. Her designated Educational psychologist) is/are her husband Vernia Buff .      Subjective     Intubated, sedated, prone    Objective     Vitals - past 24 hours  Temp:  [35.7 ??C (96.3 ??F)-37.2 ??C (99 ??F)] 35.9 ??C (96.6 ??F)  Heart Rate:  [77-105] 85  SpO2 Pulse:  [77-104] 85  Resp:  [23-33] 33  FiO2 (%):  [75 %-100 %] 75 %  SpO2:  [87 %-100 %] 92 % Intake/Output  I/O last 3 completed shifts:  In: 3897.6 [I.V.:1707.6; NG/GT:1290; IV Piggyback:900]  Out: 2562 [Urine:60; Other:1902; Stool:600]     ??  Physical Exam:   General: NAD, well nourished, obese, intubated, sedated, prone  HEENT:  normocephalic, trachea mdl, anicteric, no scleral edema, no conjuctival erythema/drainage, MMM and pink   CV: RRR. S1 and S2 normal, no m/r/c/g. no bruit, or JVD. DP Pulses 2  equal. 1+ BLE edema.  Lungs:   chest rise symm, diminished bases. CTA bilat, no wheezes/crackles/rhonchi. Good air movement.  Skin:   w/d/i, no jaundice present, no rashes, lesions, petechiae or breakdown. no drng, erythema.    Abd: contour, abdomen soft, non-tender and not distended. Normoactive bowel sounds x 4 quads, no rebound tenderness or guarding.   Ext: No cyanosis, bruising, clubbing or edema.   Neuro: perrl 3Sl, mdl, orbital edema, +c/c/g,     Continuous Infusions:   ??? CISATRacurium 4 mcg/kg/min (07/13/20 2236)   ??? HYDROmorphone 11 mg/hr (07/14/20 0829)   ??? midazolam (1 mg/mL) infusion 12 mg/hr (07/14/20 0900)   ??? norepinephrine bitartrate-NS 5 mcg/min (07/14/20 0700)   ??? NxStage RFP 400 (+/- BB) 5000 mL - contains 2 mEq/L of potassium     ??? NxStage RFP 401 (+/- BB) 5000 mL - contains 4 mEq/L of potassium         Scheduled Medications:   ??? buPROPion  100 mg Enteral tube: gastric  TID   ??? Cefepime  2 g Intravenous Q12H   ??? chlorhexidine  5 mL Mouth BID   ??? escitalopram oxalate  20 mg Enteral tube: gastric  Daily   ??? famotidine  20 mg Enteral  tube: gastric  Daily   ??? heparin (porcine) for subcutaneous use  7,500 Units Subcutaneous [facility]   ??? insulin NPH  5 Units Subcutaneous Q12H [facility]   ??? insulin regular  0-12 Units Subcutaneous Q6H [facility]   ??? levETIRAcetam  500 mg Enteral tube: gastric  Daily   ??? levothyroxine  100 mcg Enteral tube: gastric  daily   ??? LORazepam  8 mg Enteral tube: gastric  Q4H   ??? magnesium sulfate  2 g Intravenous Once   ??? metoclopramide  10 mg Intravenous Q6H [facility]   ??? oxyCODONE  60 mg Enteral tube: gastric  Q4H   ??? polyethylen glycol  17 g Enteral tube: gastric  TID   ??? pravastatin  40 mg Enteral tube: gastric  Daily   ??? QUEtiapine  50 mg Enteral tube: gastric  TID   ??? sennosides  10 mL Enteral tube: gastric  Nightly   ??? Tacrolimus  1 mg Enteral tube: gastric  BID   ??? vancomycin  1,500 mg Intravenous Q24H   ??? white petrolatum-mineral oiL  1 application Both Eyes BID       PRN medications:  CISATRacurium, dextrose 50 % in water (D50W), HYDROmorphone, midazolam    Data/Imaging Review: Reviewed in Epic and personally interpreted on 07/14/2020. See EMR for detailed results.      Critical Care Attestation     This patient is critically ill or injured with the impairment of vital organ systems such that there is a high probability of imminent or life threatening deterioration in the patient's condition. This patient must remain in the ICU for ongoing evaluation of the comprehensive management plan outlined in this note. I directly provided critical care services as documented in this note and the critical care time spent (45 min) is exclusive of separately billable procedures.    Voncille Simm Paulino Door, PA

## 2020-07-14 NOTE — Unmapped (Signed)
[facility] Nephrology Continuous Renal Replacement Therapy Procedure Note     07/14/2020    Evelyn Rollins was seen and examined on CRRT    CHIEF COMPLAINT: Acute Kidney Disease    INTERVAL HISTORY: Able to tolerate increased UF last night. Was +1.4 liters yesterday     CURRENT DIALYSIS PRESCRIPTION:  Device: CRRT Device: NxStage  Therapy fluid: Therapy Fluid :  (multibic 4k)  Therapy fluid rate: Therapy Fluid Rate (L/hr): 2.7 L/hr  Blood flow rate: Blood Pump Rate (mL/min): 300 mL/min  Fluid removal rate: Hourly Fluid Removal Rate (mL/hr): 150 mL/hr    PHYSICAL EXAM:  Vitals:  Temp:  [35.7 ??C-37.2 ??C] 35.9 ??C  Heart Rate:  [77-105] 85  SpO2 Pulse:  [77-104] 85  MAP:  [36 mmHg-84 mmHg] 64 mmHg  A BP-2: (89-142)/(31-62) 109/46  MAP:  [36 mmHg-84 mmHg] 64 mmHg    In/Outs:    Intake/Output Summary (Last 24 hours) at 07/14/2020 1022  Last data filed at 07/14/2020 1000  Gross per 24 hour   Intake 3104.94 ml   Output 1619 ml   Net 1485.94 ml        Weights:  Admission Weight: (!) 104.8 kg (231 lb)  Last documented Weight: (!) 108 kg (238 lb 1.6 oz)  Weight Change from Previous Day: No weight listed for specified days    Assessment:   General: Appearing ill  Pulmonary: prone; intubated; lungs clear  Cardiovascular: distant heart sounds  Extremities: trace edema  Access: Left IJ non-tunneled catheter     LAB DATA:  Lab Results   Component Value Date    NA 138 07/14/2020    K 4.1 07/14/2020    CL 107 07/14/2020    CO2 26.0 07/14/2020    BUN 14 07/14/2020    CREATININE 1.25 (H) 07/14/2020    CALCIUM 8.4 (L) 07/14/2020    MG 1.8 07/14/2020    PHOS 2.1 (L) 07/14/2020    ALBUMIN 2.0 (L) 07/14/2020      Lab Results   Component Value Date    HCT 23.8 (L) 07/14/2020    HCT 23.8 (L) 07/14/2020    HGB 7.5 (L) 07/14/2020    HGB 7.5 (L) 07/14/2020    WBC 16.0 (H) 07/14/2020    WBC 16.0 (H) 07/14/2020        ASSESSMENT/PLAN:  Acute Kidney Disease on Continuous Renal Replacement Therapy:  - UF goal: 50-200 mL/hr as tolerated  - Anticoagulation: N/A  - Renally dose all medications; prograf adjusted today per pharmacy    Justine Null, MD  [facility] Division of Nephrology & Hypertension

## 2020-07-14 NOTE — Unmapped (Signed)
Pt has been sedated this shift, chemically paralyzed. BIS monitor applied, within range this am but difficult to maintain leads due to prone positioning and moisture/edema of face. Remains on levo, slightly increased requirement this afternoon. Other VSS. Afebrile. Weaned to 75% PRVC this am, Vt reduced per MD. Pt not as able to maintain sats, on 90% the rest of today. MD aware. TF via OGT, steadily increasing towards goal rate. FMS and foley in place. Diminished URO. CRRT ongoing, UF increased to 200. Subcutaneous Heparin for VTE prophylaxis. Q2hr turns maintained.     Problem: Adult Inpatient Plan of Care  Goal: Plan of Care Review  Outcome: Progressing  Goal: Patient-Specific Goal (Individualization)  Outcome: Progressing  Goal: Absence of Hospital-Acquired Illness or Injury  Outcome: Progressing  Goal: Optimal Comfort and Wellbeing  Outcome: Progressing  Goal: Readiness for Transition of Care  Outcome: Progressing  Goal: Rounds/Family Conference  Outcome: Progressing     Problem: Skin Injury Risk Increased  Goal: Skin Health and Integrity  Outcome: Progressing     Problem: Infection  Goal: Infection Symptom Resolution  Outcome: Progressing     Problem: Fall Injury Risk  Goal: Absence of Fall and Fall-Related Injury  Outcome: Progressing     Problem: Wound  Goal: Optimal Wound Healing  Outcome: Progressing     Problem: Communication Impairment (Mechanical Ventilation, Invasive)  Goal: Effective Communication  Outcome: Progressing     Problem: Device-Related Complication Risk (Mechanical Ventilation, Invasive)  Goal: Optimal Device Function  Outcome: Progressing     Problem: Inability to Wean (Mechanical Ventilation, Invasive)  Goal: Mechanical Ventilation Liberation  Outcome: Progressing     Problem: Skin and Tissue Injury (Mechanical Ventilation, Invasive)  Goal: Absence of Device-Related Skin and Tissue Injury  Outcome: Progressing     Problem: Ventilator-Induced Lung Injury (Mechanical Ventilation, Invasive) Goal: Absence of Ventilator-Induced Lung Injury  Outcome: Progressing     Problem: Self-Care Deficit  Goal: Improved Ability to Complete Activities of Daily Living  Outcome: Progressing     Problem: Asthma Comorbidity  Goal: Maintenance of Asthma Control  Outcome: Progressing     Problem: COPD Comorbidity  Goal: Maintenance of COPD Symptom Control  Outcome: Progressing     Problem: Diabetes Comorbidity  Goal: Blood Glucose Level Within Desired Range  Outcome: Progressing     Problem: Heart Failure Comorbidity  Goal: Maintenance of Heart Failure Symptom Control  Outcome: Progressing     Problem: Hypertension Comorbidity  Goal: Blood Pressure in Desired Range  Outcome: Progressing     Problem: Obstructive Sleep Apnea Risk or Actual (Comorbidity Management)  Goal: Unobstructed Breathing During Sleep  Outcome: Progressing     Problem: Pain Chronic (Persistent) (Comorbidity Management)  Goal: Acceptable Pain Control and Functional Ability  Outcome: Progressing     Problem: Seizure Disorder Comorbidity  Goal: Maintenance of Seizure Control  Outcome: Progressing     Problem: LTC COVID-19 Confirmed or Rule-Out  Goal: Patient/Resident will remain free of complications due to COVID-19  Description: 1. Review and update the patient/resident's isolation status in the Isolation activity  2. Keep the patient/resident's door closed at all times and limit movement of the patient/resident outside of the room to medically essential purposes. If applicable, transfer patient/resident to a single-person room   3. Educate and reinforce infection prevention and control practices recommended by CDC  4. Frequently monitor for development of more severe symptoms   5. Use appropriate PPE when providing care for patient/resident  6. Reinforce no visitor policy and non-essential health care  personnel policy, except for certain compassionate care situations  7. If worsening of symptoms occur, alert the nearest Hospital caring for confirmed COVID-19 patients and arrange for transfer with proper precautions including placing a facemask on the patient/resident during transfer  8. Communicate information about known or suspected case of COVID-19 to appropriate public health personnel  9. Avoid procedures that are likely to induce coughing (e.g., sputum induction, open suctioning of airways). If required, do so in an Airborne Infection Isolation Room. The health care provider in the room should wear an N95 or higher-level respirator, eye protection, gloves, and a gown. The number of HCP present during the procedure should be limited to only those essential for patient/resident care and procedure support. Visitors should not be present for the procedure. Clean and disinfect procedure room surfaces promptly  10. Update patient/resident and family/representatives as needed      Outcome: Progressing     Problem: Gas Exchange Impaired  Goal: Optimal Gas Exchange  Outcome: Progressing     Problem: Electrolyte Imbalance (Acute Kidney Injury/Impairment)  Goal: Serum Electrolyte Balance  Outcome: Progressing     Problem: Fluid Imbalance (Acute Kidney Injury/Impairment)  Goal: Optimal Fluid Balance  Outcome: Progressing     Problem: Hematologic Alteration (Acute Kidney Injury/Impairment)  Goal: Hemoglobin, Hematocrit and Platelets Within Normal Range  Outcome: Progressing     Problem: Oral Intake Inadequate (Acute Kidney Injury/Impairment)  Goal: Optimal Nutrition Intake  Outcome: Progressing     Problem: Renal Function Impairment (Acute Kidney Injury/Impairment)  Goal: Effective Renal Function  Outcome: Progressing

## 2020-07-15 LAB — APTT
APTT: 28.7 s (ref 24.9–36.9)
APTT: 28.2 s (ref 24.9–36.9)
Coagulation surface induced:Time:Pt:PPP:Qn:Coag: 28.2
Coagulation surface induced:Time:Pt:PPP:Qn:Coag: 28.5
Coagulation surface induced:Time:Pt:PPP:Qn:Coag: 28.7
HEPARIN CORRELATION: 0.2

## 2020-07-15 LAB — BLOOD GAS CRITICAL CARE PANEL, ARTERIAL
BASE EXCESS ARTERIAL: 1.5 (ref -2.0–2.0)
BASE EXCESS ARTERIAL: 1.3 (ref -2.0–2.0)
BASE EXCESS ARTERIAL: 1 (ref -2.0–2.0)
BASE EXCESS ARTERIAL: 0.9 (ref -2.0–2.0)
BASE EXCESS ARTERIAL: 1.3 (ref -2.0–2.0)
CALCIUM IONIZED ARTERIAL (MG/DL): 5.25 mg/dL (ref 4.40–5.40)
CALCIUM IONIZED ARTERIAL (MG/DL): 4.19 mg/dL — ABNORMAL LOW (ref 4.40–5.40)
FIO2 ARTERIAL: 80
GLUCOSE WHOLE BLOOD: 126 mg/dL (ref 70–179)
GLUCOSE WHOLE BLOOD: 131 mg/dL (ref 70–179)
GLUCOSE WHOLE BLOOD: 138 mg/dL (ref 70–179)
GLUCOSE WHOLE BLOOD: 129 mg/dL (ref 70–179)
HCO3 ARTERIAL: 27 mmol/L (ref 22–27)
HCO3 ARTERIAL: 27 mmol/L (ref 22–27)
HCO3 ARTERIAL: 28 mmol/L — ABNORMAL HIGH (ref 22–27)
HCO3 ARTERIAL: 26 mmol/L (ref 22–27)
HEMOGLOBIN BLOOD GAS: 7.3 g/dL — ABNORMAL LOW (ref 12.00–16.00)
HEMOGLOBIN BLOOD GAS: 7.2 g/dL — ABNORMAL LOW (ref 12.00–16.00)
HEMOGLOBIN BLOOD GAS: 9 g/dL — ABNORMAL LOW (ref 12.00–16.00)
HEMOGLOBIN BLOOD GAS: 6.9 g/dL — ABNORMAL LOW (ref 12.00–16.00)
HEMOGLOBIN BLOOD GAS: 6.9 g/dL — ABNORMAL LOW (ref 12.00–16.00)
LACTATE BLOOD ARTERIAL: 1.5 mmol/L — ABNORMAL HIGH (ref <1.3)
LACTATE BLOOD ARTERIAL: 1.4 mmol/L — ABNORMAL HIGH (ref <1.3)
LACTATE BLOOD ARTERIAL: 1.5 mmol/L — ABNORMAL HIGH (ref <1.3)
LACTATE BLOOD ARTERIAL: 1.5 mmol/L — ABNORMAL HIGH (ref <1.3)
LACTATE BLOOD ARTERIAL: 1.4 mmol/L — ABNORMAL HIGH (ref <1.3)
O2 SATURATION ARTERIAL: 95.4 % (ref 94.0–100.0)
O2 SATURATION ARTERIAL: 97 % (ref 94.0–100.0)
O2 SATURATION ARTERIAL: 90.9 % — ABNORMAL LOW (ref 94.0–100.0)
O2 SATURATION ARTERIAL: 90.4 % — ABNORMAL LOW (ref 94.0–100.0)
O2 SATURATION ARTERIAL: 97.9 % (ref 94.0–100.0)
PCO2 ARTERIAL: 54.5 mmHg — ABNORMAL HIGH (ref 35.0–45.0)
PCO2 ARTERIAL: 53.6 mmHg — ABNORMAL HIGH (ref 35.0–45.0)
PCO2 ARTERIAL: 60 mmHg — ABNORMAL HIGH (ref 35.0–45.0)
PCO2 ARTERIAL: 54.6 mmHg — ABNORMAL HIGH (ref 35.0–45.0)
PCO2 ARTERIAL: 58.5 mmHg — ABNORMAL HIGH (ref 35.0–45.0)
PH ARTERIAL: 7.32 — ABNORMAL LOW (ref 7.35–7.45)
PH ARTERIAL: 7.31 — ABNORMAL LOW (ref 7.35–7.45)
PH ARTERIAL: 7.31 — ABNORMAL LOW (ref 7.35–7.45)
PH ARTERIAL: 7.28 — ABNORMAL LOW (ref 7.35–7.45)
PH ARTERIAL: 7.31 — ABNORMAL LOW (ref 7.35–7.45)
PH ARTERIAL: 7.27 — ABNORMAL LOW (ref 7.35–7.45)
PO2 ARTERIAL: 77 mmHg — ABNORMAL LOW (ref 80.0–110.0)
PO2 ARTERIAL: 93.4 mmHg (ref 80.0–110.0)
PO2 ARTERIAL: 62.1 mmHg — ABNORMAL LOW (ref 80.0–110.0)
PO2 ARTERIAL: 61.1 mmHg — ABNORMAL LOW (ref 80.0–110.0)
PO2 ARTERIAL: 152 mmHg — ABNORMAL HIGH (ref 80.0–110.0)
PO2 ARTERIAL: 90.9 mmHg (ref 80.0–110.0)
POTASSIUM WHOLE BLOOD: 4.4 mmol/L (ref 3.4–4.6)
POTASSIUM WHOLE BLOOD: 4.4 mmol/L (ref 3.4–4.6)
POTASSIUM WHOLE BLOOD: 4.8 mmol/L — ABNORMAL HIGH (ref 3.4–4.6)
POTASSIUM WHOLE BLOOD: 4.4 mmol/L (ref 3.4–4.6)
POTASSIUM WHOLE BLOOD: 4.3 mmol/L (ref 3.4–4.6)
SODIUM WHOLE BLOOD: 136 mmol/L (ref 135–145)
SODIUM WHOLE BLOOD: 137 mmol/L (ref 135–145)
SODIUM WHOLE BLOOD: 136 mmol/L (ref 135–145)
SODIUM WHOLE BLOOD: 137 mmol/L (ref 135–145)
SODIUM WHOLE BLOOD: 138 mmol/L (ref 135–145)

## 2020-07-15 LAB — COMPREHENSIVE METABOLIC PANEL
ALBUMIN: 1.9 g/dL — ABNORMAL LOW (ref 3.4–5.0)
ALBUMIN: 1.7 g/dL — ABNORMAL LOW (ref 3.4–5.0)
ALKALINE PHOSPHATASE: 63 U/L (ref 46–116)
ALKALINE PHOSPHATASE: 77 U/L (ref 46–116)
ALKALINE PHOSPHATASE: 68 U/L (ref 46–116)
ALT (SGPT): <7 U/L — ABNORMAL LOW (ref 10–49)
ALT (SGPT): 7 U/L — ABNORMAL LOW (ref 10–49)
ALT (SGPT): 12 U/L (ref 10–49)
ANION GAP: 1 mmol/L — ABNORMAL LOW (ref 5–14)
ANION GAP: 3 mmol/L — ABNORMAL LOW (ref 5–14)
AST (SGOT): 31 U/L (ref <=34)
AST (SGOT): 33 U/L (ref <=34)
AST (SGOT): 41 U/L — ABNORMAL HIGH (ref <=34)
BILIRUBIN TOTAL: 0.2 mg/dL — ABNORMAL LOW (ref 0.3–1.2)
BILIRUBIN TOTAL: 0.2 mg/dL — ABNORMAL LOW (ref 0.3–1.2)
BLOOD UREA NITROGEN: 14 mg/dL (ref 9–23)
BLOOD UREA NITROGEN: 16 mg/dL (ref 9–23)
BLOOD UREA NITROGEN: 16 mg/dL (ref 9–23)
BUN / CREAT RATIO: 11
BUN / CREAT RATIO: 13
BUN / CREAT RATIO: 13
CALCIUM: 8.9 mg/dL (ref 8.7–10.4)
CALCIUM: 8.3 mg/dL — ABNORMAL LOW (ref 8.7–10.4)
CHLORIDE: 107 mmol/L (ref 98–107)
CHLORIDE: 103 mmol/L (ref 98–107)
CHLORIDE: 107 mmol/L (ref 98–107)
CO2: 28 mmol/L (ref 20.0–31.0)
CO2: 28 mmol/L (ref 20.0–31.0)
CO2: 27 mmol/L (ref 20.0–31.0)
CREATININE: 1.21 mg/dL — ABNORMAL HIGH
CREATININE: 1.22 mg/dL — ABNORMAL HIGH
EGFR CKD-EPI AA FEMALE: 57 mL/min/{1.73_m2} — ABNORMAL LOW (ref >=60)
EGFR CKD-EPI AA FEMALE: 59 mL/min/{1.73_m2} — ABNORMAL LOW (ref >=60)
EGFR CKD-EPI AA FEMALE: 58 mL/min/{1.73_m2} — ABNORMAL LOW (ref >=60)
EGFR CKD-EPI NON-AA FEMALE: 49 mL/min/{1.73_m2} — ABNORMAL LOW (ref >=60)
EGFR CKD-EPI NON-AA FEMALE: 51 mL/min/{1.73_m2} — ABNORMAL LOW (ref >=60)
EGFR CKD-EPI NON-AA FEMALE: 50 mL/min/{1.73_m2} — ABNORMAL LOW (ref >=60)
GLUCOSE RANDOM: 124 mg/dL (ref 70–179)
GLUCOSE RANDOM: 137 mg/dL (ref 70–179)
GLUCOSE RANDOM: 133 mg/dL (ref 70–179)
POTASSIUM: 4.5 mmol/L (ref 3.4–4.5)
POTASSIUM: 4.7 mmol/L — ABNORMAL HIGH (ref 3.4–4.5)
POTASSIUM: 4.2 mmol/L (ref 3.4–4.5)
PROTEIN TOTAL: 5.3 g/dL — ABNORMAL LOW (ref 5.7–8.2)
PROTEIN TOTAL: 5.6 g/dL — ABNORMAL LOW (ref 5.7–8.2)
SODIUM: 136 mmol/L (ref 135–145)
SODIUM: 133 mmol/L — ABNORMAL LOW (ref 135–145)
SODIUM: 137 mmol/L (ref 135–145)

## 2020-07-15 LAB — CBC
HEMATOCRIT: 26.6 % — ABNORMAL LOW (ref 36.0–46.0)
HEMATOCRIT: 23 % — ABNORMAL LOW (ref 36.0–46.0)
HEMOGLOBIN: 8.3 g/dL — ABNORMAL LOW (ref 12.0–16.0)
HEMOGLOBIN: 7.2 g/dL — ABNORMAL LOW (ref 12.0–16.0)
MEAN CORPUSCULAR HEMOGLOBIN CONC: 31.1 g/dL (ref 31.0–37.0)
MEAN CORPUSCULAR HEMOGLOBIN: 29.2 pg (ref 26.0–34.0)
MEAN CORPUSCULAR HEMOGLOBIN: 29.3 pg (ref 26.0–34.0)
MEAN CORPUSCULAR VOLUME: 93.6 fL (ref 80.0–100.0)
MEAN PLATELET VOLUME: 8.9 fL (ref 7.0–10.0)
MEAN PLATELET VOLUME: 8.7 fL (ref 7.0–10.0)
PLATELET COUNT: 178 10*9/L (ref 150–440)
PLATELET COUNT: 127 10*9/L — ABNORMAL LOW (ref 150–440)
RED BLOOD CELL COUNT: 2.85 10*12/L — ABNORMAL LOW (ref 4.00–5.20)
RED BLOOD CELL COUNT: 2.45 10*12/L — ABNORMAL LOW (ref 4.00–5.20)
RED CELL DISTRIBUTION WIDTH: 15.8 % — ABNORMAL HIGH (ref 12.0–15.0)
WBC ADJUSTED: 26.8 10*9/L — ABNORMAL HIGH (ref 4.5–11.0)

## 2020-07-15 LAB — CBC W/ AUTO DIFF
BASOPHILS ABSOLUTE COUNT: 0.1 10*9/L (ref 0.0–0.1)
BASOPHILS RELATIVE PERCENT: 0.5 %
EOSINOPHILS ABSOLUTE COUNT: 0.1 10*9/L (ref 0.0–0.4)
EOSINOPHILS RELATIVE PERCENT: 0.9 %
HEMOGLOBIN: 7.2 g/dL — ABNORMAL LOW (ref 12.0–16.0)
LARGE UNSTAINED CELLS: 1 % (ref 0–4)
LYMPHOCYTES ABSOLUTE COUNT: 0.8 10*9/L — ABNORMAL LOW (ref 1.5–5.0)
LYMPHOCYTES RELATIVE PERCENT: 5.5 %
MEAN CORPUSCULAR HEMOGLOBIN CONC: 31.4 g/dL (ref 31.0–37.0)
MEAN CORPUSCULAR HEMOGLOBIN: 29.3 pg (ref 26.0–34.0)
MEAN CORPUSCULAR VOLUME: 93.4 fL (ref 80.0–100.0)
MEAN PLATELET VOLUME: 8.7 fL (ref 7.0–10.0)
MONOCYTES ABSOLUTE COUNT: 0.3 10*9/L (ref 0.2–0.8)
MONOCYTES RELATIVE PERCENT: 2.2 %
NEUTROPHILS RELATIVE PERCENT: 90.3 %
PLATELET COUNT: 123 10*9/L — ABNORMAL LOW (ref 150–440)
RED BLOOD CELL COUNT: 2.45 10*12/L — ABNORMAL LOW (ref 4.00–5.20)
RED CELL DISTRIBUTION WIDTH: 15.3 % — ABNORMAL HIGH (ref 12.0–15.0)
WBC ADJUSTED: 15.2 10*9/L — ABNORMAL HIGH (ref 4.5–11.0)

## 2020-07-15 LAB — D-DIMER QUANTITATIVE (CW,ML,HL): Lab: 4119 — ABNORMAL HIGH

## 2020-07-15 LAB — BILIRUBIN TOTAL: Bilirubin:MCnc:Pt:Ser/Plas:Qn:: 0.2 — ABNORMAL LOW

## 2020-07-15 LAB — PHOSPHORUS
Phosphate:MCnc:Pt:Ser/Plas:Qn:: 2 — ABNORMAL LOW
Phosphate:MCnc:Pt:Ser/Plas:Qn:: 2.1 — ABNORMAL LOW
Phosphate:MCnc:Pt:Ser/Plas:Qn:: 2.4

## 2020-07-15 LAB — MEAN PLATELET VOLUME: Platelet mean volume:EntVol:Pt:Bld:Qn:Automated count: 8.9

## 2020-07-15 LAB — PH ARTERIAL: pH:LsCnc:Pt:BldA:Qn:: 7.31 — ABNORMAL LOW

## 2020-07-15 LAB — MAGNESIUM
MAGNESIUM: 1.9 mg/dL (ref 1.6–2.6)
Magnesium:MCnc:Pt:Ser/Plas:Qn:: 1.6
Magnesium:MCnc:Pt:Ser/Plas:Qn:: 1.8
Magnesium:MCnc:Pt:Ser/Plas:Qn:: 1.9

## 2020-07-15 LAB — RED BLOOD CELL COUNT: Lab: 2.45 — ABNORMAL LOW

## 2020-07-15 LAB — NEUTROPHIL LEFT SHIFT

## 2020-07-15 LAB — HEMOGLOBIN BLOOD GAS: Hemoglobin:MCnc:Pt:Bld:Qn:: 7.2 — ABNORMAL LOW

## 2020-07-15 LAB — ALKALINE PHOSPHATASE
Alkaline phosphatase:CCnc:Pt:Ser/Plas:Qn:: 63
Alkaline phosphatase:CCnc:Pt:Ser/Plas:Qn:: 77

## 2020-07-15 LAB — SODIUM WHOLE BLOOD: Sodium:SCnc:Pt:Bld:Qn:: 136

## 2020-07-15 LAB — HCO3 ARTERIAL: Bicarbonate:SCnc:Pt:BldA:Qn:: 27

## 2020-07-15 LAB — GLUCOSE WHOLE BLOOD: Glucose:MCnc:Pt:Bld:Qn:: 138

## 2020-07-15 LAB — TACROLIMUS, TROUGH: Lab: 4.8 — ABNORMAL LOW

## 2020-07-15 LAB — PO2 ARTERIAL: Oxygen:PPres:Pt:BldA:Qn:: 90.9

## 2020-07-15 MED ADMIN — oxyCODONE (ROXICODONE) immediate release tablet 60 mg: 60 mg | GASTROENTERAL | @ 09:00:00 | Stop: 2020-07-17

## 2020-07-15 MED ADMIN — HYDROmorphone 1 mg/mL in 0.9% sodium chloride: 0-12 mg/h | INTRAVENOUS | @ 05:00:00 | Stop: 2020-07-15

## 2020-07-15 MED ADMIN — NxStage RFP 401 (+/- BB) 5000 mL - contains 4 mEq/L of potassium dialysis solution 5,000 mL: 5000 mL | INTRAVENOUS_CENTRAL | @ 05:00:00

## 2020-07-15 MED ADMIN — heparin (porcine) 7,500 units/0.75 mL syringe: 7500 [IU] | SUBCUTANEOUS | @ 02:00:00

## 2020-07-15 MED ADMIN — midazolam in sodium chloride 0.9% (1 mg/mL) infusion PMB: 0-14 mg/h | INTRAVENOUS | @ 10:00:00

## 2020-07-15 MED ADMIN — CISATRacurium (NIMBEX) 200 mg/100 mL (2 mg/mL) continuous infusion: 0-10 ug/kg/min | INTRAVENOUS | @ 23:00:00

## 2020-07-15 MED ADMIN — HYDROmorphone 1 mg/mL in 0.9% sodium chloride: 0-12 mg/h | INTRAVENOUS | @ 07:00:00 | Stop: 2020-07-15

## 2020-07-15 MED ADMIN — HYDROmorphone 1 mg/mL in 0.9% sodium chloride: 0-12 mg/h | INTRAVENOUS | @ 16:00:00 | Stop: 2020-07-15

## 2020-07-15 MED ADMIN — LORazepam (ATIVAN) tablet 8 mg: 8 mg | GASTROENTERAL | @ 13:00:00

## 2020-07-15 MED ADMIN — chlorhexidine (PERIDEX) 0.12 % solution 5 mL: 5 mL | OROMUCOSAL

## 2020-07-15 MED ADMIN — VECuronium (NORCURON) 10 mg injection: @ 22:00:00 | Stop: 2020-07-15

## 2020-07-15 MED ADMIN — metoclopramide (REGLAN) injection 10 mg: 10 mg | INTRAVENOUS | @ 04:00:00

## 2020-07-15 MED ADMIN — CISATRacurium (NIMBEX) 200 mg/100 mL (2 mg/mL) continuous infusion: 0-10 ug/kg/min | INTRAVENOUS | @ 10:00:00

## 2020-07-15 MED ADMIN — polyethylene glycol (MIRALAX) packet 17 g: 17 g | GASTROENTERAL | @ 13:00:00

## 2020-07-15 MED ADMIN — NxStage RFP 401 (+/- BB) 5000 mL - contains 4 mEq/L of potassium dialysis solution 5,000 mL: 5000 mL | INTRAVENOUS_CENTRAL | @ 10:00:00

## 2020-07-15 MED ADMIN — oxyCODONE (ROXICODONE) immediate release tablet 60 mg: 60 mg | GASTROENTERAL | @ 22:00:00 | Stop: 2020-07-17

## 2020-07-15 MED ADMIN — insulin NPH (HumuLIN,NovoLIN) injection 5 Units: 5 [IU] | SUBCUTANEOUS | @ 10:00:00

## 2020-07-15 MED ADMIN — QUEtiapine (SEROquel) tablet 50 mg: 50 mg | GASTROENTERAL | @ 17:00:00

## 2020-07-15 MED ADMIN — QUEtiapine (SEROquel) tablet 50 mg: 50 mg | GASTROENTERAL

## 2020-07-15 MED ADMIN — famotidine (PEPCID) tablet 20 mg: 20 mg | GASTROENTERAL | @ 13:00:00

## 2020-07-15 MED ADMIN — LORazepam (ATIVAN) tablet 8 mg: 8 mg | GASTROENTERAL | @ 04:00:00

## 2020-07-15 MED ADMIN — midazolam (VERSED) injection 5 mg: 5 mg | INTRAVENOUS | @ 20:00:00

## 2020-07-15 MED ADMIN — NxStage RFP 401 (+/- BB) 5000 mL - contains 4 mEq/L of potassium dialysis solution 5,000 mL: 5000 mL | INTRAVENOUS_CENTRAL

## 2020-07-15 MED ADMIN — oxyCODONE (ROXICODONE) immediate release tablet 60 mg: 60 mg | GASTROENTERAL | @ 17:00:00 | Stop: 2020-07-17

## 2020-07-15 MED ADMIN — HYDROmorphone 1 mg/mL in 0.9% sodium chloride: 0-12 mg/h | INTRAVENOUS | @ 12:00:00 | Stop: 2020-07-15

## 2020-07-15 MED ADMIN — oxyCODONE (ROXICODONE) immediate release tablet 60 mg: 60 mg | GASTROENTERAL | @ 07:00:00 | Stop: 2020-07-17

## 2020-07-15 MED ADMIN — oxyCODONE (ROXICODONE) immediate release tablet 60 mg: 60 mg | GASTROENTERAL | @ 13:00:00 | Stop: 2020-07-17

## 2020-07-15 MED ADMIN — midazolam in sodium chloride 0.9% (1 mg/mL) infusion PMB: 0-12 mg/h | INTRAVENOUS | @ 04:00:00

## 2020-07-15 MED ADMIN — polyethylene glycol (MIRALAX) packet 17 g: 17 g | GASTROENTERAL

## 2020-07-15 MED ADMIN — NxStage RFP 401 (+/- BB) 5000 mL - contains 4 mEq/L of potassium dialysis solution 5,000 mL: 5000 mL | INTRAVENOUS_CENTRAL | @ 20:00:00

## 2020-07-15 MED ADMIN — buPROPion (WELLBUTRIN) tablet 100 mg: 100 mg | GASTROENTERAL | @ 13:00:00

## 2020-07-15 MED ADMIN — tacrolimus (PROGRAF) oral suspension: 1 mg | GASTROENTERAL

## 2020-07-15 MED ADMIN — buPROPion (WELLBUTRIN) tablet 100 mg: 100 mg | GASTROENTERAL | @ 17:00:00

## 2020-07-15 MED ADMIN — midazolam in sodium chloride 0.9% (1 mg/mL) infusion PMB: 0-14 mg/h | INTRAVENOUS | @ 13:00:00

## 2020-07-15 MED ADMIN — levothyroxine (SYNTHROID) tablet 100 mcg: 100 ug | GASTROENTERAL | @ 09:00:00

## 2020-07-15 MED ADMIN — chlorhexidine (PERIDEX) 0.12 % solution 5 mL: 5 mL | OROMUCOSAL | @ 12:00:00

## 2020-07-15 MED ADMIN — polyethylene glycol (MIRALAX) packet 17 g: 17 g | GASTROENTERAL | @ 17:00:00

## 2020-07-15 MED ADMIN — CISATRacurium (NIMBEX) injection 10 mg: 10 mg | INTRAVENOUS | @ 20:00:00

## 2020-07-15 MED ADMIN — QUEtiapine (SEROquel) tablet 50 mg: 50 mg | GASTROENTERAL | @ 13:00:00

## 2020-07-15 MED ADMIN — oxyCODONE (ROXICODONE) immediate release tablet 60 mg: 60 mg | GASTROENTERAL | @ 02:00:00 | Stop: 2020-07-17

## 2020-07-15 MED ADMIN — cefepime (MAXIPIME) 2 g in dextrose 100 mL IVPB (premix): 2 g | INTRAVENOUS | @ 02:00:00 | Stop: 2020-07-17

## 2020-07-15 MED ADMIN — LORazepam (ATIVAN) tablet 8 mg: 8 mg | GASTROENTERAL | @ 07:00:00

## 2020-07-15 MED ADMIN — metoclopramide (REGLAN) injection 10 mg: 10 mg | INTRAVENOUS | @ 15:00:00

## 2020-07-15 MED ADMIN — LORazepam (ATIVAN) tablet 8 mg: 8 mg | GASTROENTERAL

## 2020-07-15 MED ADMIN — metoclopramide (REGLAN) injection 10 mg: 10 mg | INTRAVENOUS | @ 22:00:00

## 2020-07-15 MED ADMIN — escitalopram oxalate (LEXAPRO) tablet 20 mg: 20 mg | GASTROENTERAL | @ 13:00:00

## 2020-07-15 MED ADMIN — buPROPion (WELLBUTRIN) tablet 100 mg: 100 mg | GASTROENTERAL

## 2020-07-15 MED ADMIN — LORazepam (ATIVAN) tablet 8 mg: 8 mg | GASTROENTERAL | @ 21:00:00

## 2020-07-15 MED ADMIN — heparin (porcine) 7,500 units/0.75 mL syringe: 7500 [IU] | SUBCUTANEOUS | @ 17:00:00

## 2020-07-15 MED ADMIN — heparin (porcine) 7,500 units/0.75 mL syringe: 7500 [IU] | SUBCUTANEOUS | @ 10:00:00

## 2020-07-15 MED ADMIN — LORazepam (ATIVAN) tablet 8 mg: 8 mg | GASTROENTERAL | @ 15:00:00

## 2020-07-15 NOTE — Unmapped (Signed)
[facility] Nephrology Continuous Renal Replacement Therapy Procedure Note     07/15/2020    Evelyn Rollins was seen and examined on CRRT    CHIEF COMPLAINT: Acute Kidney Disease    INTERVAL HISTORY:. Was -500 ml yesterday. Overnight improvement in pressor, but increased FiO2    CURRENT DIALYSIS PRESCRIPTION:  Device: CRRT Device: NxStage  Therapy fluid: Therapy Fluid :  (multibic 4k)  Therapy fluid rate: Therapy Fluid Rate (L/hr): 2.7 L/hr  Blood flow rate: Blood Pump Rate (mL/min): 250 mL/min  Fluid removal rate: Hourly Fluid Removal Rate (mL/hr): 200 mL/hr    PHYSICAL EXAM:  Vitals:  Temp:  [36.1 ??C-37.6 ??C] 36.4 ??C  Heart Rate:  [71-111] 85  SpO2 Pulse:  [67-110] 85  MAP:  [57 mmHg-160 mmHg] 72 mmHg  A BP-2: (101-273)/(41-114) 122/56  MAP:  [57 mmHg-160 mmHg] 72 mmHg    In/Outs:    Intake/Output Summary (Last 24 hours) at 07/15/2020 1554  Last data filed at 07/15/2020 1330  Gross per 24 hour   Intake 2394.89 ml   Output 3454 ml   Net -1059.11 ml        Weights:  Admission Weight: (!) 104.8 kg (231 lb)  Last documented Weight: (!) 108 kg (238 lb 1.6 oz)  Weight Change from Previous Day: No weight listed for specified days    Assessment:   General: Appearing ill  Pulmonary: prone; intubated; lungs clear  Cardiovascular: distant heart sounds  Extremities: trace edema  Access: Left IJ non-tunneled catheter     LAB DATA:  Lab Results   Component Value Date    NA 133 (L) 07/15/2020    NA 137 07/15/2020    K 4.7 (H) 07/15/2020    K 4.8 (H) 07/15/2020    CL 103 07/15/2020    CO2 28.0 07/15/2020    BUN 16 07/15/2020    CREATININE 1.21 (H) 07/15/2020    CALCIUM 8.9 07/15/2020    MG 1.9 07/15/2020    PHOS 2.0 (L) 07/15/2020    ALBUMIN 1.8 (L) 07/15/2020      Lab Results   Component Value Date    HCT 26.6 (L) 07/15/2020    HGB 8.3 (L) 07/15/2020    WBC 26.8 (H) 07/15/2020        ASSESSMENT/PLAN:  Acute Kidney Disease on Continuous Renal Replacement Therapy:  - UF goal: 200 mL/hr as tolerated  - Anticoagulation: N/A  - Renally dose all medications; prograf per pharmacy    Justine Null, MD  [facility] Division of Nephrology & Hypertension

## 2020-07-15 NOTE — Unmapped (Signed)
Tacrolimus Therapeutic Monitoring Pharmacy Note    Evelyn Rollins is a 54 y.o. female continuing tacrolimus.     Indication: Liver transplant     Date of Transplant: 06/2015      Prior Dosing Information: Current regimen 1mg  BID      Goals:  Therapeutic Drug Levels  Tacrolimus trough goal: 3-5 ng/mL    Additional Clinical Monitoring/Outcomes  ?? Monitor renal function (SCr and urine output) and liver function (LFTs)  ?? Monitor for signs/symptoms of adverse events (e.g., hyperglycemia, hyperkalemia, hypomagnesemia, hypertension, headache, tremor)    Results:   Tacrolimus level: 4.8 ng/mL, drawn appropriately    Pharmacokinetic Considerations and Significant Drug Interactions:  ??? Concurrent hepatotoxic medications: None identified  ??? Concurrent CYP3A4 substrates/inhibitors: None identified  ??? Concurrent nephrotoxic medications: None identified    Assessment/Plan:  Recommendedation(s)  ??? Decrease to 1mg  + 0.5mg     Follow-up  ??? Daily levels until stable.   ??? A pharmacist will continue to monitor and recommend levels as appropriate    Please page service pharmacist with questions/clarifications.    Charleen Kirks, PharmD

## 2020-07-15 NOTE — Unmapped (Signed)
Pt has been in prone position throughout day shift and has handled head turn well. Current settings reflect prvc 400 33 +18 80%. Staff will continue to assess and monitor

## 2020-07-15 NOTE — Unmapped (Signed)
MICU Daily Progress Note     Date of Service: 07/15/2020    Problem List:   Principal Problem:    Acute hypoxemic respiratory failure due to severe acute respiratory syndrome coronavirus 2 (SARS-CoV-2) disease (CMS-HCC)  Active Problems:    Liver transplant recipient (CMS-HCC)    Morbid obesity with BMI of 40.0-44.9, adult (CMS-HCC)    Acute renal failure (ARF) (CMS-HCC)  Resolved Problems:    * No resolved hospital problems. *      Interval history:  Evelyn Rollins is a 54 y.o. female with PMH cirrhosis status post liver transplant 2016 on chronic immunosuppressive therapy, hypertension, diabetes, hypothyroidism who presented to [facility] on 7/22 with myalgias, sore throat, loss of taste and fevers  x5 days. When her symptoms had started, she took a home Covid test on 7/17 which was found to be positive. She has had poor p.o. intake for the last several days. Her husband also has similar symptoms. In the OSH ED she was found to be severely hypoxic with O2 saturations of 50% on room air. She was placed on a nonrebreather and current saturations increased to the low 90s. Chest x-ray shows bilateral infiltrates consistent with COVID-19 pneumonia. Her Covid test is positive. Inflammatory markers are elevated. She received a dose of Decadron in the emergency room.    24hr events:   -remained prone  - increased UF to 200/hr    Neurological   Analgesia and Sedation  -continue dilaudid and versed gtt  -cis gtt (7/31 -   -goal RASS 3- to 4-  -PRN dilaudid and versed  -ativan 8q4h, oxy 60q4h  - seroquel 50 mg tid  ??  Anxiety, depression  -restarted home meds Wellbutrin/Lexapro, and Seroquel in place of abilify  ??  Seizure history (not taking AEDs), ?seizure activity  -neurology consult >> c/f RUE weakness, MRI 7/27- no acute pathology  -vEEG negative for seizure x 48hrs >> discont monitoring per neuro 7/25  - MRI with possible sz focus,  keppra 500 mg daily per neuro recommendations       Pulmonary   ARDS, Acute resp failure r/t COVID, pnuemomediastinum .  Intubated 7/22   -worsening oxygenation and airway pressures higher  - only able to be supinated for 4 hrs 8/2 and reproned at 430pm and remained prone since.     - remains paralyzed and sedated on 90%   - lowered TV from 400 to 350 (8cc/kg->7cc/kg) but became more acidotic despite high RR and returned to 400.     Cardiovascular   h/o HTN   - holding home antihtn  - last echo 2016 w nml LVEF, enlrgd RV and deprssed RV fxn w mild phtn  -continue statin     Hypotension  -continue levophed for MAP goal >65    Renal   ARF on CRRT:  - cont foley for accurate I/O during critical illness   - replete electrolytes prn and monitor daily  - nephrology following  - CRRT goal UF 150--> 200 hr   - goal = to neg     Infectious Disease/Autoimmune   COVID 19 infection  Symptom onset:??7/17  Exposure: unknown  COVID PCR+: 7/17  OSH Admission:??7/22  Day of admission/transfer: 7/22  OSH Meds Given:??dexamethasone  COVID Specific??Meds:??dexamethasone 7/22, remdesivir 7/22  Vaccination status: not vaccinated  ??  Monitoring:  - Special airborne/contact precautions (If unavailable, droplet & contact precautions)  - f/u daily CMP, CBC w/ diff, CRP, D-dimer, DIC, troponin, pro-BNP with weekly ferritin, LDH and  cytokine level  - f/u q6h lactate; ABG  ??  Medications:  - completed remdesivir  - cont decadron 6mg  po  daily x 10d, started 7/22  ??  CAP/VAP: MSSA   - febrile, leukocytosis   - started cefep/vanc 7/25--> changed to ancef on 7/27 x7 days  - pan cx for T>38.2   - wbc count rising, hypoxia worsening, broad spectrum abx (vanc/cefe) started 8/1.  vanco stopped 8/4    Cultures:  Blood Culture, Routine (no units)   Date Value   07/12/2020 No Growth at 48 hours   07/12/2020 No Growth at 48 hours     Lower Respiratory Culture (no units)   Date Value   07/12/2020 OROPHARYNGEAL FLORA ISOLATED   07/04/2020 3+ Methicillin-Susceptible Staphylococcus aureus (A)     WBC (10*9/L)   Date Value   07/15/2020 15.2 (H) WBC, UA (/HPF)   Date Value   07/12/2020 7 (H)          FEN/GI   -trickle tube feeds > high residuals, continue reglan  -bowel regimen w senna and miralax, FMS with minimal stooling   -PPI  ??  Immunocompromised s/p Liver transplant in 2016  -restarted tacro 1mg  q12  - f/u tacro level  -HOLD cellcept    Heme/Coag   Hypercoagulable state 2/2 covid  -anticoagulation group C heparin  -LE dopplers neg for DVT 7/24, repeat pending  (no obvious popliteal DVT on pocus)   -no evid bleeding    Endocrine   History of DM2  -insulin gtt stopped  -ISS  -NPH 5 bid    Hypothyroidism  -continue synthroid  ??  Integumentary   - WOCN consulted for high risk skin assessment Yes.  - WOCN recs >>Mepilex Xt cut to fit above new ET trial holder, proning protocol in place, interdry can be used to folds  - cont pressure mitigating precautions per skin policy    Prophylaxis/LDA/Restraints/Consults   Can CVC be removed? No: need for medications requiring central access (e.g. pressors)   Can A-line be removed? No: frequent ABGs  Can Foley be removed? No: Need continuous I/O  Mobility plan: Step 1 - Range of motion    Feeding: Trickle feeds, advance as tolerated  Analgesia: No pain issues  Sedation SAT/SBT: No PEEP > 8  Thromboembolic ppx: Enoxaparin  Head of bed >30 degrees: Yes  Ulcer ppx: Yes, coagulopathy  Glucose within target range: Yes, in range    Does patient need/have an active type/screen? NA    RASS at goal? Yes  [place] Agitation Assessment Scale (RASS) : -5 (07/15/2020  6:00 AM)     Can antipsychotics be stopped? N/A, not on antipsychotics  CAM-ICU Result: Positive (07/14/2020  8:00 AM)      Would hospice care be appropriate for this patient? No, patient improving or expected to improve    Patient Lines/Drains/Airways Status    Active Active Lines, Drains, & Airways     Name:   Placement date:   Placement time:   Site:   Days:    ETT  7.5   August 01, 2020    1700     12    CVC Triple Lumen 08/01/20 Non-tunneled Right Internal jugular 2020/08/01    1945    Internal jugular   12    Hemodialysis Catheter With Distal Infusion Port 07/09/20 Left Internal jugular 1.4 mL 1.4 mL   07/09/20    1700    Internal jugular   5    NG/OG Tube Decompression;[facility]  mouth   07/03/20    0610    Center mouth   12    Urethral Catheter   August 01, 2020    ???    ???   13    Arterial Line Aug 01, 2020 Right Radial   01-Aug-2020    2315    Radial   12              Patient Lines/Drains/Airways Status    Active Wounds     Name:   Placement date:   Placement time:   Site:   Days:    Wound 07/11/20 Pressure Injury Face Left cheek under prev ET tube holder- pt was proned DTI   07/11/20    1100    Face   3    Wound 07/13/20 Pressure Injury Nose from ET holder. pt was proned  DTI   07/13/20    1254    Nose   1    Wound 07/13/20 Pressure Injury Other (Comment) Right cheek r/t proning DTI   07/13/20    1255    Other (Comment)   1                Goals of Care     Code Status: Full Code    Designated Healthcare Decision Maker:  Ms. Smoots current decisional capacity for healthcare decision-making is incapacitated. Her designated Educational psychologist) is/are her husband Vernia Buff .      Subjective     Intubated, sedated, prone    Objective     Vitals - past 24 hours  Temp:  [35.9 ??C (96.6 ??F)-37.6 ??C (99.7 ??F)] 36.6 ??C (97.9 ??F)  Heart Rate:  [80-111] 84  SpO2 Pulse:  [81-110] 84  Resp:  [33] 33  FiO2 (%):  [75 %-90 %] 90 %  SpO2:  [89 %-97 %] 93 % Intake/Output  I/O last 3 completed shifts:  In: 4812.9 [I.V.:1662.9; NG/GT:2250; IV Piggyback:900]  Out: 5069 [Urine:30; ZOXWR:6045; Stool:200]     ??  Physical Exam:   General: NAD, well nourished, obese, intubated, sedated, prone  HEENT:  normocephalic, trachea mdl, anicteric, no scleral edema, no conjuctival erythema/drainage, MMM and pink   CV: RRR. S1 and S2 normal, no m/r/c/g. no bruit, or JVD. DP Pulses 2  equal. 1+ BLE edema.  Lungs:   chest rise symm, diminished bases. CTA bilat, no wheezes/crackles/rhonchi. Good air movement.  Skin: w/d/i, no jaundice present, no rashes, lesions, petechiae or breakdown. no drng, erythema.    Abd: contour, abdomen soft, non-tender and not distended. Normoactive bowel sounds x 4 quads, no rebound tenderness or guarding.   Ext: No cyanosis, bruising, clubbing or edema.   Neuro: perrl 3Sl, mdl, orbital edema, +c/c/g,     Continuous Infusions:   ??? CISATRacurium 4 mcg/kg/min (07/15/20 0612)   ??? HYDROmorphone 11 mg/hr (07/15/20 0308)   ??? midazolam (1 mg/mL) infusion 12 mg/hr (07/15/20 4098)   ??? norepinephrine bitartrate-NS Stopped (07/15/20 0528)   ??? NxStage RFP 400 (+/- BB) 5000 mL - contains 2 mEq/L of potassium     ??? NxStage RFP 401 (+/- BB) 5000 mL - contains 4 mEq/L of potassium         Scheduled Medications:   ??? buPROPion  100 mg Enteral tube: gastric  TID   ??? Cefepime  2 g Intravenous Q12H   ??? chlorhexidine  5 mL Mouth BID   ??? escitalopram oxalate  20 mg Enteral tube: gastric  Daily   ??? famotidine  20 mg Enteral  tube: gastric  Daily   ??? heparin (porcine) for subcutaneous use  7,500 Units Subcutaneous [facility]   ??? insulin NPH  5 Units Subcutaneous Q12H [facility]   ??? insulin regular  0-12 Units Subcutaneous Q6H [facility]   ??? levETIRAcetam  500 mg Enteral tube: gastric  Daily   ??? levothyroxine  100 mcg Enteral tube: gastric  daily   ??? LORazepam  8 mg Enteral tube: gastric  Q4H   ??? metoclopramide  10 mg Intravenous Q6H [facility]   ??? oxyCODONE  60 mg Enteral tube: gastric  Q4H   ??? polyethylen glycol  17 g Enteral tube: gastric  TID   ??? pravastatin  40 mg Enteral tube: gastric  Daily   ??? QUEtiapine  50 mg Enteral tube: gastric  TID   ??? sennosides  10 mL Enteral tube: gastric  Nightly   ??? Tacrolimus  1 mg Enteral tube: gastric  BID   ??? vancomycin  2,000 mg Intravenous Q24H   ??? white petrolatum-mineral oiL  1 application Both Eyes BID       PRN medications:  CISATRacurium, dextrose 50 % in water (D50W), HYDROmorphone, midazolam    Data/Imaging Review: Reviewed in Epic and personally interpreted on 07/15/2020. See EMR for detailed results. Critical Care Attestation     This patient is critically ill or injured with the impairment of vital organ systems such that there is a high probability of imminent or life threatening deterioration in the patient's condition. This patient must remain in the ICU for ongoing evaluation of the comprehensive management plan outlined in this note. I directly provided critical care services as documented in this note and the critical care time spent (45 min) is exclusive of separately billable procedures.    Jaylon Boylen Paulino Door, PA

## 2020-07-15 NOTE — Unmapped (Signed)
IMMUNOCOMPROMISED HOST INFECTIOUS DISEASE PROGRESS NOTE    Evelyn Rollins is being seen in consultation at the request of Montine Circle, * for evaluation of COVID-19 in transplant patient.    Assessment/Recommendations:    Evelyn Rollins is a 54 y.o. female with history of NAFLD s/p OLT in 2016, DM2 not on insulin (unknown A1c), seizures x1 in remote past and morbid obesity who presented to an OSH on 7/22 with 7 days of symptoms and profound hypoxia due to critical COVID-19 ARDS requiring intubation on 7/22, now with c/f secondary bacterial PNA w/ MSSA from positive sputum cultures on 7/24. Coverage broadened 8/1 after increasing WBC and new pressor requirement concerning for worsening infection.    Seems to have experienced recurrent sepsis with increased WBC and pressor requirement, responded to broadened antibiotics to some degree but unclear if this was pulmonary source as CXRs difficult to interpret and gram stain with normal flora. For now can continue to treat for VAP broadly.    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   - Surgical complications: biliary anastomosis leak with pelvic collection, s/p abx  - Serologies: CMV D-/R+, EBV D+/R+, Toxo D?/R-  - Induction: basiliximab  - Immunosuppression: myfortic, tac  - Prophylaxis: none    Pertinent Exposure History  Donor Infections: Donor death from drowning     Pertinent Co-morbidities  # Morbid obesity  # Hypothyroidism  # DM - unknown A1c  # Depression/anxiety  # Seizure x1 in remote past    Drug Intolerances - none     Infectious Diseases Problem List:  # Critical SARS CoV-2 infection with ARDS requiring intubation and w/ c/f MSSA PNA (7/22) in unvaccinated patient, onset 06/24/20, WBC and new pressor requirement 07/12/2020  -Known exposure(s): daughter, son-in-law on 7/10  -Date of symptom onset : 7/14  -Date of diagnostic test: 7/17 (home test), 7/22 at OSH  -Remdesivir administered: 7/22- 7/25  -Steroids administered: dexamethasone 7/22-7/30  -CCP 1U or 2U administered: n/a  -Monoclonal antibody administered: n/a  -Tocilizumab administered: n/a  -07/04/20 Lower resp Cx: 3+ MSSA  -07/12/20 Uptrending WBC and pressors restarted with sputum Cx: 1+ GPCs, 1+ GPRs, OROPHARYNGEAL FLORA  - 07/13/2020 MRSA screen negative  - Prior Abx: Cefepime & Vancomycin 7/25-7/26, cefazolin 7/27-7/30, Vancomycin 8/1-8/3  - Current Abx: Cefepime 8/1-    S Epi bacteremia (MRSE), S hominis 07/04/20 favored as contaminants  Favored as a contaminant as only growing 1 of 2 bottles from 7/24.  -07/04/20 BC 1 of 2 w/ MRSE  -07/06/20 BC x 2 negative       RECOMMENDATIONS FOR 07/15/2020    Diagnostic  Monitor WBC trend and hemodynamics for adequate infection control (7/25-8/7)    Treatment  OK to continue broad spectrum coverage with cefepime to complete 7-14d empirical sepsis/VAP course          The [facility] ID service will sign off at this time. Please page the ID Transplant/Liquid Oncology Fellow consult at [phone] with questions. Patient discussed with Dr. Reynold Bowen.    Please page ID team 48 hours prior to discharge to arrange follow up.    Karlton Lemon, MD, PhD  Infectious Diseases Fellow    Attending attestation  I saw and evaluated the patient, participating in the key portions of the service.  I reviewed the resident???s note.  I agree with the resident???s findings and plan.   Ephraim Hamburger, MD      Interim History:      WBC and  pressor requirements continue to improve today and vent settings remain high. Has ongoing thick pinkish secretions from ET tube, no signs of diarrhea, no new pressure ulcers per nursing.    Allergies:  No Known Allergies    Medications:   Antimicrobials:  Anti-infectives (From admission, onward)      Start     Dose/Rate Route Frequency Ordered Stop    07/12/20 1100  cefepime (MAXIPIME) 2 g in dextrose 100 mL IVPB (premix)      2 g  200 mL/hr over 30 Minutes Intravenous Every 12 hours 07/12/20 1011 07/17/20 1059            Current/Prior immunomodulators:  Dexamethasone 6mg  7/22-7/30  Myfortic  Tac    Other medications reviewed.     Review of Systems:  Negative besides as described above       Vital Signs last 24 hours:  Temp:  [36.1 ??C-37.6 ??C] 36.4 ??C  Heart Rate:  [71-111] 84  SpO2 Pulse:  [67-110] 85  Resp:  [33] 33  A BP-2: (101-273)/(40-114) 115/44  MAP:  [37 mmHg-160 mmHg] 63 mmHg  FiO2 (%):  [80 %-100 %] 90 %  SpO2:  [88 %-97 %] 91 %    Physical Exam:  Patient Lines/Drains/Airways Status      Active Active Lines, Drains, & Airways       Name:   Placement date:   Placement time:   Site:   Days:    ETT  7.5   07/02/20    1700     12    CVC Triple Lumen 07/02/20 Non-tunneled Right Internal jugular   07/02/20    1945    Internal jugular   12    Hemodialysis Catheter With Distal Infusion Port 07/09/20 Left Internal jugular 1.4 mL 1.4 mL   07/09/20    1700    Internal jugular   5    NG/OG Tube Decompression;[facility] mouth   07/03/20    0610    Center mouth   12    Urethral Catheter   07/02/20           13    Arterial Line 07/02/20 Right Radial   07/02/20    2315    Radial   12                  General: Ill-appearing; Intubated and sedated, proned  Cardiovascular: Unable to assess while proned  Respiratory: Coarse mechanical breath sounds b/l  Renal / Urinary: Urine in foley bag red tinged, clear  Neurologic: Nonresponsive  Skin: No rashes or lesions appreciated, bruising throughout  Extremities / MSK: No edema; Peripheral pulses intact; No joint or muscular swelling or tenderness     Data for Medical Decision Making  ( [id number] )     Recent Labs   Lab Units 07/15/20  1112 07/15/20  4540 07/15/20  0723 07/15/20  0319 07/14/20  2336 07/14/20  2336 07/14/20  1948 07/14/20  1948 07/14/20  1527 07/14/20  1527 07/14/20  1213 07/14/20  1213 07/14/20  0853 07/14/20  0853 07/14/20  0539 07/14/20  0305 07/13/20  2306 07/13/20  1943 07/13/20  1943 07/13/20  1657 07/13/20  0855 07/13/20  0534 07/12/20  1117 07/12/20  0803 07/09/20  0737 07/09/20 0354   WBC 10*9/L  --   --   --  15.2*  --   --   --  15.9*  --   --   --  16.6*  --   --   --  16.0* - 16.0*  --   --  19.5*  --    < > 17.2*   < > 27.0*   < > 18.0*   HEMOGLOBIN g/dL 8.3*  --   --  7.2*  --   --   --  7.5*  --   --   --  7.5*  --   --   --  7.5* - 7.5*  --    < > 8.2*  --    < > 8.7*   < > 10.4*   < > 11.6*   HEMOGLOBIN BG   --   --   --   --   --   --   --   --   --   --   --   --   --   --   --   --   --   --   --   --   --   --   --   --    < >  --    PLATELET COUNT (1) 10*9/L 178  --   --  123*  --   --   --  139*  --   --   --  128*  --   --   --  106* - 106*  --    < > 124*  --    < > 123*   < > 247   < > 496*   NEUTRO ABS 10*9/L  --   --   --  13.7*  --   --   --   --   --   --   --   --   --   --   --  14.9*  --   --   --   --   --  16.0*  --  25.0*  --  16.8*   LYMPHO ABS 10*9/L  --   --   --  0.8*  --   --   --   --   --   --   --   --   --   --   --  0.6*  --   --   --   --   --  0.6*  --  0.9*  --  0.5*   EOSINO ABS 10*9/L  --   --   --  0.1  --   --   --   --   --   --   --   --   --   --   --  0.2  --   --   --   --   --  0.2  --  0.5*  --  0.0   BUN mg/dL 16  --   --  14  --   --   --  14  --   --   --  15  --   --   --  14  --    < > 17  --    < > 22   < > 32*   < > 138*   CREATININE mg/dL 2.95*  --   --  6.21*  --   --   --  1.12*  --   --   --  1.24*  --   --   --  1.25*  --    < > 1.27*  --    < > 1.37*   < >  1.66*   < > 4.73*   AST U/L 33  --   --  31  --   --   --  26  --   --   --  33  --   --   --  36*  --    < > 40*  --    < > 37*   < > 35*   < > 52*   ALT U/L 7*  --   --  <7*  --   --   --  <7*  --   --   --  <7*  --   --   --  <7*  --    < > <7*  --    < > <7*   < > 10   < > 18   BILIRUBIN TOTAL mg/dL 0.2*  --   --  0.2*  --   --   --  0.2*  --   --   --  0.2*  --   --   --  0.2*  --    < > 0.3  --    < > 0.2*   < > 0.2*   < > 0.2*   ALK PHOS U/L 77  --   --  63  --   --   --  60  --   --   --  74  --   --   --  58  --    < > 67  --    < > 62   < > 65   < > 60   POTASSIUM WHOLE BLOOD mmol/L 4.8* 4.4 4.4 4.4   < > 4.6   < > 4.6   < > 4.5   < > 4.2   < > 4.1   < > 4.6 4.9*   < > 4.7*   < >   < > 4.6   < > 4.6   < > 5.1*   POTASSIUM mmol/L 4.7*  --   --  4.5   < >  --   --  4.3   < >  --   --  4.4  --   --    < > 4.7*  --    < > 4.7*  --    < > 4.5   < > 4.7*   < > 5.2*   MAGNESIUM mg/dL  --   --  1.9  --   --  1.8  --   --   --  2.0  --   --   --  1.8  --   --  1.7  --   --    < >   < >  --    < > 1.7   < > 2.7*   PHOSPHORUS mg/dL  --   --  2.0*  --   --  2.4  --   --   --  2.9  --   --   --  2.1*  --   --  3.1  --   --    < >   < >  --    < > 2.8   < > 6.0*   CALCIUM mg/dL 8.9  --   --  8.5*  --   --   --  7.4*  --   --   --  8.6*  --   --   --  8.4*  --    < > 8.5*  --    < > 8.3*   < > 8.1*   < > 8.5*    < > = values in this interval not displayed.       Drug Level   Results in Past 360 Days  Result Component Current Result   Vancomycin Rm 13.6 (07/14/2020)   Tacrolimus, Trough 4.8 (L) (07/15/2020)       New Culture Data   Microbiology Results (last day)       Procedure Component Value Date/Time Date/Time    Lower Respiratory Culture [[id number]] Collected: 07/12/20 1142    Lab Status: Preliminary result Specimen: Aspirate, Tracheal from Tracheal aspirate Updated: 07/14/20 1335     Lower Respiratory Culture OROPHARYNGEAL FLORA ISOLATED     Gram Stain 10-25 PMNS/LPF      <10  Epithelial cells/LPF      1+ Gram positive cocci      1+ Gram positive bacilli      Acceptable for culture    Narrative:      Specimen Source: Tracheal aspirate    MRSA Screen [[id number]] Collected: 07/13/20 0907    Lab Status: Final result Specimen: Nares from Nose Updated: 07/14/20 1317     MRSA Screen NOT DETECTED    Narrative:      Specimen Source: Nose    Blood Culture, Adult [[id number]] Collected: 07/12/20 0853    Lab Status: Preliminary result Specimen: Blood from 1 Peripheral Draw Updated: 07/14/20 0930     Blood Culture, Routine No Growth at 48 hours    Blood Culture, Adult [[id number]] Collected: 07/12/20 0853    Lab Status: Preliminary result Specimen: Blood from 1 Peripheral Draw Updated: 07/14/20 0930     Blood Culture, Routine No Growth at 48 hours          Recent Studies   None

## 2020-07-16 LAB — CBC W/ AUTO DIFF
BASOPHILS ABSOLUTE COUNT: 0.1 10*9/L (ref 0.0–0.1)
BASOPHILS RELATIVE PERCENT: 0.9 %
EOSINOPHILS ABSOLUTE COUNT: 0.2 10*9/L (ref 0.0–0.4)
EOSINOPHILS RELATIVE PERCENT: 1.7 %
HEMATOCRIT: 21.9 % — ABNORMAL LOW (ref 36.0–46.0)
HEMOGLOBIN: 6.8 g/dL — ABNORMAL LOW (ref 12.0–16.0)
LARGE UNSTAINED CELLS: 1 % (ref 0–4)
LYMPHOCYTES RELATIVE PERCENT: 6.8 %
MEAN CORPUSCULAR HEMOGLOBIN CONC: 31.3 g/dL (ref 31.0–37.0)
MEAN CORPUSCULAR VOLUME: 94.3 fL (ref 80.0–100.0)
MEAN PLATELET VOLUME: 8.2 fL (ref 7.0–10.0)
MONOCYTES ABSOLUTE COUNT: 0.3 10*9/L (ref 0.2–0.8)
MONOCYTES RELATIVE PERCENT: 2.6 %
NEUTROPHILS ABSOLUTE COUNT: 11 10*9/L — ABNORMAL HIGH (ref 2.0–7.5)
NEUTROPHILS RELATIVE PERCENT: 87.4 %
PLATELET COUNT: 110 10*9/L — ABNORMAL LOW (ref 150–440)
RED BLOOD CELL COUNT: 2.32 10*12/L — ABNORMAL LOW (ref 4.00–5.20)
RED CELL DISTRIBUTION WIDTH: 15.6 % — ABNORMAL HIGH (ref 12.0–15.0)
WBC ADJUSTED: 12.6 10*9/L — ABNORMAL HIGH (ref 4.5–11.0)

## 2020-07-16 LAB — SPECIMEN SOURCE

## 2020-07-16 LAB — COMPREHENSIVE METABOLIC PANEL
ALBUMIN: 1.7 g/dL — ABNORMAL LOW (ref 3.4–5.0)
ALBUMIN: 1.4 g/dL — ABNORMAL LOW (ref 3.4–5.0)
ALBUMIN: 1.8 g/dL — ABNORMAL LOW (ref 3.4–5.0)
ALKALINE PHOSPHATASE: 65 U/L (ref 46–116)
ALKALINE PHOSPHATASE: 70 U/L (ref 46–116)
ALT (SGPT): 12 U/L (ref 10–49)
ALT (SGPT): 12 U/L (ref 10–49)
ALT (SGPT): 11 U/L (ref 10–49)
ANION GAP: 1 mmol/L — ABNORMAL LOW (ref 5–14)
ANION GAP: 4 mmol/L — ABNORMAL LOW (ref 5–14)
ANION GAP: 3 mmol/L — ABNORMAL LOW (ref 5–14)
AST (SGOT): 35 U/L — ABNORMAL HIGH (ref <=34)
AST (SGOT): 34 U/L (ref <=34)
AST (SGOT): 30 U/L (ref <=34)
BILIRUBIN TOTAL: 0.2 mg/dL — ABNORMAL LOW (ref 0.3–1.2)
BILIRUBIN TOTAL: 0.3 mg/dL (ref 0.3–1.2)
BILIRUBIN TOTAL: 0.3 mg/dL (ref 0.3–1.2)
BLOOD UREA NITROGEN: 16 mg/dL (ref 9–23)
BLOOD UREA NITROGEN: 15 mg/dL (ref 9–23)
BLOOD UREA NITROGEN: 14 mg/dL (ref 9–23)
BUN / CREAT RATIO: 14
BUN / CREAT RATIO: 13
BUN / CREAT RATIO: 13
CALCIUM: 8.2 mg/dL — ABNORMAL LOW (ref 8.7–10.4)
CALCIUM: 8.5 mg/dL — ABNORMAL LOW (ref 8.7–10.4)
CHLORIDE: 109 mmol/L — ABNORMAL HIGH (ref 98–107)
CHLORIDE: 104 mmol/L (ref 98–107)
CHLORIDE: 107 mmol/L (ref 98–107)
CO2: 28 mmol/L (ref 20.0–31.0)
CO2: 27 mmol/L (ref 20.0–31.0)
CO2: 27 mmol/L (ref 20.0–31.0)
CREATININE: 1.16 mg/dL — ABNORMAL HIGH
CREATININE: 1.13 mg/dL — ABNORMAL HIGH
CREATININE: 1.09 mg/dL — ABNORMAL HIGH
EGFR CKD-EPI AA FEMALE: 62 mL/min/{1.73_m2} (ref >=60)
EGFR CKD-EPI AA FEMALE: 64 mL/min/{1.73_m2} (ref >=60)
EGFR CKD-EPI AA FEMALE: 67 mL/min/{1.73_m2} (ref >=60)
EGFR CKD-EPI NON-AA FEMALE: 54 mL/min/{1.73_m2} — ABNORMAL LOW (ref >=60)
EGFR CKD-EPI NON-AA FEMALE: 55 mL/min/{1.73_m2} — ABNORMAL LOW (ref >=60)
EGFR CKD-EPI NON-AA FEMALE: 58 mL/min/{1.73_m2} — ABNORMAL LOW (ref >=60)
GLUCOSE RANDOM: 131 mg/dL (ref 70–179)
GLUCOSE RANDOM: 112 mg/dL (ref 70–179)
GLUCOSE RANDOM: 138 mg/dL (ref 70–179)
POTASSIUM: 4.1 mmol/L (ref 3.4–4.5)
POTASSIUM: 4.5 mmol/L (ref 3.4–4.5)
POTASSIUM: 4.3 mmol/L (ref 3.4–4.5)
PROTEIN TOTAL: 5.2 g/dL — ABNORMAL LOW (ref 5.7–8.2)
PROTEIN TOTAL: 4.8 g/dL — ABNORMAL LOW (ref 5.7–8.2)
PROTEIN TOTAL: 5.6 g/dL — ABNORMAL LOW (ref 5.7–8.2)
SODIUM: 138 mmol/L (ref 135–145)
SODIUM: 135 mmol/L (ref 135–145)
SODIUM: 137 mmol/L (ref 135–145)

## 2020-07-16 LAB — BLOOD GAS CRITICAL CARE PANEL, ARTERIAL
BASE EXCESS ARTERIAL: 0.4 (ref -2.0–2.0)
BASE EXCESS ARTERIAL: 0.6 (ref -2.0–2.0)
BASE EXCESS ARTERIAL: 0.7 (ref -2.0–2.0)
BASE EXCESS ARTERIAL: 0.6 (ref -2.0–2.0)
BASE EXCESS ARTERIAL: -0.2 (ref -2.0–2.0)
BASE EXCESS ARTERIAL: -0.2 (ref -2.0–2.0)
CALCIUM IONIZED ARTERIAL (MG/DL): 5.39 mg/dL (ref 4.40–5.40)
CALCIUM IONIZED ARTERIAL (MG/DL): 5.28 mg/dL (ref 4.40–5.40)
CALCIUM IONIZED ARTERIAL (MG/DL): 5.26 mg/dL (ref 4.40–5.40)
CALCIUM IONIZED ARTERIAL (MG/DL): 5.01 mg/dL (ref 4.40–5.40)
CALCIUM IONIZED ARTERIAL (MG/DL): 5.04 mg/dL (ref 4.40–5.40)
CALCIUM IONIZED ARTERIAL (MG/DL): 5.14 mg/dL (ref 4.40–5.40)
CALCIUM IONIZED ARTERIAL (MG/DL): 5.27 mg/dL (ref 4.40–5.40)
FIO2 ARTERIAL: 100
FIO2 ARTERIAL: 100
GLUCOSE WHOLE BLOOD: 139 mg/dL (ref 70–179)
GLUCOSE WHOLE BLOOD: 137 mg/dL (ref 70–179)
GLUCOSE WHOLE BLOOD: 138 mg/dL (ref 70–179)
GLUCOSE WHOLE BLOOD: 119 mg/dL (ref 70–179)
GLUCOSE WHOLE BLOOD: 135 mg/dL (ref 70–179)
GLUCOSE WHOLE BLOOD: 138 mg/dL (ref 70–179)
GLUCOSE WHOLE BLOOD: 161 mg/dL (ref 70–179)
HCO3 ARTERIAL: 27 mmol/L (ref 22–27)
HCO3 ARTERIAL: 27 mmol/L (ref 22–27)
HCO3 ARTERIAL: 27 mmol/L (ref 22–27)
HCO3 ARTERIAL: 27 mmol/L (ref 22–27)
HCO3 ARTERIAL: 26 mmol/L (ref 22–27)
HCO3 ARTERIAL: 28 mmol/L — ABNORMAL HIGH (ref 22–27)
HCO3 ARTERIAL: 26 mmol/L (ref 22–27)
HEMOGLOBIN BLOOD GAS: 6.8 g/dL — ABNORMAL LOW (ref 12.00–16.00)
HEMOGLOBIN BLOOD GAS: 7 g/dL — ABNORMAL LOW (ref 12.00–16.00)
HEMOGLOBIN BLOOD GAS: 8.4 g/dL — ABNORMAL LOW (ref 12.00–16.00)
HEMOGLOBIN BLOOD GAS: 9.4 g/dL — ABNORMAL LOW (ref 12.00–16.00)
HEMOGLOBIN BLOOD GAS: 10 g/dL — ABNORMAL LOW (ref 12.00–16.00)
HEMOGLOBIN BLOOD GAS: 9.1 g/dL — ABNORMAL LOW (ref 12.00–16.00)
HEMOGLOBIN BLOOD GAS: 8.5 g/dL — ABNORMAL LOW (ref 12.00–16.00)
LACTATE BLOOD ARTERIAL: 1.5 mmol/L — ABNORMAL HIGH (ref <1.3)
LACTATE BLOOD ARTERIAL: 1.4 mmol/L — ABNORMAL HIGH (ref <1.3)
LACTATE BLOOD ARTERIAL: 1.4 mmol/L — ABNORMAL HIGH (ref <1.3)
LACTATE BLOOD ARTERIAL: 1.5 mmol/L — ABNORMAL HIGH (ref <1.3)
LACTATE BLOOD ARTERIAL: 1.8 mmol/L — ABNORMAL HIGH (ref <1.3)
LACTATE BLOOD ARTERIAL: 1.7 mmol/L — ABNORMAL HIGH (ref <1.3)
LACTATE BLOOD ARTERIAL: 1.4 mmol/L — ABNORMAL HIGH (ref <1.3)
O2 SATURATION ARTERIAL: 91.1 % — ABNORMAL LOW (ref 94.0–100.0)
O2 SATURATION ARTERIAL: 91.6 % — ABNORMAL LOW (ref 94.0–100.0)
O2 SATURATION ARTERIAL: 89.3 % — ABNORMAL LOW (ref 94.0–100.0)
O2 SATURATION ARTERIAL: 90.6 % — ABNORMAL LOW (ref 94.0–100.0)
O2 SATURATION ARTERIAL: 94 % (ref 94.0–100.0)
PCO2 ARTERIAL: 59.4 mmHg — ABNORMAL HIGH (ref 35.0–45.0)
PCO2 ARTERIAL: 58.5 mmHg — ABNORMAL HIGH (ref 35.0–45.0)
PCO2 ARTERIAL: 59.1 mmHg — ABNORMAL HIGH (ref 35.0–45.0)
PCO2 ARTERIAL: 63.6 mmHg — ABNORMAL HIGH (ref 35.0–45.0)
PCO2 ARTERIAL: 62.9 mmHg — ABNORMAL HIGH (ref 35.0–45.0)
PCO2 ARTERIAL: 58.6 mmHg — ABNORMAL HIGH (ref 35.0–45.0)
PH ARTERIAL: 7.27 — ABNORMAL LOW (ref 7.35–7.45)
PH ARTERIAL: 7.28 — ABNORMAL LOW (ref 7.35–7.45)
PH ARTERIAL: 7.25 — ABNORMAL LOW (ref 7.35–7.45)
PH ARTERIAL: 7.28 — ABNORMAL LOW (ref 7.35–7.45)
PH ARTERIAL: 7.28 — ABNORMAL LOW (ref 7.35–7.45)
PH ARTERIAL: 7.26 — ABNORMAL LOW (ref 7.35–7.45)
PO2 ARTERIAL: 78 mmHg — ABNORMAL LOW (ref 80.0–110.0)
PO2 ARTERIAL: 63.3 mmHg — ABNORMAL LOW (ref 80.0–110.0)
PO2 ARTERIAL: 64.6 mmHg — ABNORMAL LOW (ref 80.0–110.0)
PO2 ARTERIAL: 61.4 mmHg — ABNORMAL LOW (ref 80.0–110.0)
PO2 ARTERIAL: 61.9 mmHg — ABNORMAL LOW (ref 80.0–110.0)
PO2 ARTERIAL: 71.4 mmHg — ABNORMAL LOW (ref 80.0–110.0)
PO2 ARTERIAL: 81.8 mmHg (ref 80.0–110.0)
PO2 ARTERIAL: 58.4 mmHg — ABNORMAL LOW (ref 80.0–110.0)
POTASSIUM WHOLE BLOOD: 4.3 mmol/L (ref 3.4–4.6)
POTASSIUM WHOLE BLOOD: 4.2 mmol/L (ref 3.4–4.6)
POTASSIUM WHOLE BLOOD: 4.7 mmol/L — ABNORMAL HIGH (ref 3.4–4.6)
POTASSIUM WHOLE BLOOD: 4.3 mmol/L (ref 3.4–4.6)
POTASSIUM WHOLE BLOOD: 4.2 mmol/L (ref 3.4–4.6)
POTASSIUM WHOLE BLOOD: 4.4 mmol/L (ref 3.4–4.6)
POTASSIUM WHOLE BLOOD: 4.4 mmol/L (ref 3.4–4.6)
SODIUM WHOLE BLOOD: 136 mmol/L (ref 135–145)
SODIUM WHOLE BLOOD: 138 mmol/L (ref 135–145)
SODIUM WHOLE BLOOD: 137 mmol/L (ref 135–145)
SODIUM WHOLE BLOOD: 137 mmol/L (ref 135–145)
SODIUM WHOLE BLOOD: 136 mmol/L (ref 135–145)
SODIUM WHOLE BLOOD: 136 mmol/L (ref 135–145)
SODIUM WHOLE BLOOD: 139 mmol/L (ref 135–145)

## 2020-07-16 LAB — POTASSIUM WHOLE BLOOD
Potassium:SCnc:Pt:Bld:Qn:: 4.3
Potassium:SCnc:Pt:Bld:Qn:: 4.3

## 2020-07-16 LAB — APTT
APTT: 26.6 s (ref 24.9–36.9)
APTT: 27.6 s (ref 24.9–36.9)
Coagulation surface induced:Time:Pt:PPP:Qn:Coag: 26.6
Coagulation surface induced:Time:Pt:PPP:Qn:Coag: 27.6

## 2020-07-16 LAB — CBC
HEMATOCRIT: 25.4 % — ABNORMAL LOW (ref 36.0–46.0)
HEMATOCRIT: 30.2 % — ABNORMAL LOW (ref 36.0–46.0)
HEMOGLOBIN: 8 g/dL — ABNORMAL LOW (ref 12.0–16.0)
HEMOGLOBIN: 9.5 g/dL — ABNORMAL LOW (ref 12.0–16.0)
MEAN CORPUSCULAR HEMOGLOBIN CONC: 31.4 g/dL (ref 31.0–37.0)
MEAN CORPUSCULAR HEMOGLOBIN: 29.9 pg (ref 26.0–34.0)
MEAN CORPUSCULAR HEMOGLOBIN: 29.8 pg (ref 26.0–34.0)
MEAN CORPUSCULAR VOLUME: 94.6 fL (ref 80.0–100.0)
MEAN CORPUSCULAR VOLUME: 94.8 fL (ref 80.0–100.0)
MEAN PLATELET VOLUME: 8.9 fL (ref 7.0–10.0)
MEAN PLATELET VOLUME: 8.6 fL (ref 7.0–10.0)
PLATELET COUNT: 114 10*9/L — ABNORMAL LOW (ref 150–440)
PLATELET COUNT: 107 10*9/L — ABNORMAL LOW (ref 150–440)
RED BLOOD CELL COUNT: 3.19 10*12/L — ABNORMAL LOW (ref 4.00–5.20)
RED CELL DISTRIBUTION WIDTH: 15.5 % — ABNORMAL HIGH (ref 12.0–15.0)
RED CELL DISTRIBUTION WIDTH: 15.8 % — ABNORMAL HIGH (ref 12.0–15.0)
WBC ADJUSTED: 13.7 10*9/L — ABNORMAL HIGH (ref 4.5–11.0)
WBC ADJUSTED: 12 10*9/L — ABNORMAL HIGH (ref 4.5–11.0)

## 2020-07-16 LAB — MEAN CORPUSCULAR HEMOGLOBIN CONC: Erythrocyte mean corpuscular hemoglobin concentration:MCnc:Pt:RBC:Qn:Automated count: 31.4

## 2020-07-16 LAB — GLUCOSE RANDOM: Glucose:MCnc:Pt:Ser/Plas:Qn:: 138

## 2020-07-16 LAB — MAGNESIUM
Magnesium:MCnc:Pt:Ser/Plas:Qn:: 1.6
Magnesium:MCnc:Pt:Ser/Plas:Qn:: 1.7
Magnesium:MCnc:Pt:Ser/Plas:Qn:: 1.9

## 2020-07-16 LAB — MEAN CORPUSCULAR VOLUME: Erythrocyte mean corpuscular volume:EntVol:Pt:RBC:Qn:Automated count: 94.6

## 2020-07-16 LAB — SMEAR REVIEW

## 2020-07-16 LAB — HEPARIN CORRELATION: Lab: 0.2

## 2020-07-16 LAB — CALCIUM IONIZED ARTERIAL (MG/DL): Calcium.ionized:MCnc:Pt:Bld:Qn:: 5.27

## 2020-07-16 LAB — CHLORIDE: Chloride:SCnc:Pt:Ser/Plas:Qn:: 109 — ABNORMAL HIGH

## 2020-07-16 LAB — ANION GAP: Anion gap 3:SCnc:Pt:Ser/Plas:Qn:: 4 — ABNORMAL LOW

## 2020-07-16 LAB — LARGE UNSTAINED CELLS: Lab: 1

## 2020-07-16 LAB — SODIUM WHOLE BLOOD: Sodium:SCnc:Pt:Bld:Qn:: 137

## 2020-07-16 LAB — PHOSPHORUS
Phosphate:MCnc:Pt:Ser/Plas:Qn:: 2 — ABNORMAL LOW
Phosphate:MCnc:Pt:Ser/Plas:Qn:: 2 — ABNORMAL LOW
Phosphate:MCnc:Pt:Ser/Plas:Qn:: 3.1

## 2020-07-16 LAB — GLUCOSE WHOLE BLOOD: Glucose:MCnc:Pt:Bld:Qn:: 138

## 2020-07-16 LAB — PO2 ARTERIAL: Oxygen:PPres:Pt:BldA:Qn:: 61.9 — ABNORMAL LOW

## 2020-07-16 LAB — TACROLIMUS, TROUGH: Lab: 3.8 — ABNORMAL LOW

## 2020-07-16 LAB — HEMOGLOBIN BLOOD GAS: Hemoglobin:MCnc:Pt:Bld:Qn:: 9.1 — ABNORMAL LOW

## 2020-07-16 LAB — D-DIMER QUANTITATIVE (CW,ML,HL): Lab: 4149 — ABNORMAL HIGH

## 2020-07-16 MED ADMIN — HYDROmorphone (PF) (DILAUDID) injection 4 mg: 4 mg | INTRAVENOUS | @ 10:00:00

## 2020-07-16 MED ADMIN — white petrolatum-mineral oiL (SOOTHE PM) 80-20 % ophthalmic ointment 1 application: 1 | OPHTHALMIC | @ 13:00:00

## 2020-07-16 MED ADMIN — metoclopramide (REGLAN) injection 10 mg: 10 mg | INTRAVENOUS | @ 21:00:00

## 2020-07-16 MED ADMIN — NxStage RFP 401 (+/- BB) 5000 mL - contains 4 mEq/L of potassium dialysis solution 5,000 mL: 5000 mL | INTRAVENOUS_CENTRAL | @ 01:00:00

## 2020-07-16 MED ADMIN — oxyCODONE (ROXICODONE) immediate release tablet 60 mg: 60 mg | GASTROENTERAL | @ 09:00:00 | Stop: 2020-07-30

## 2020-07-16 MED ADMIN — polyethylene glycol (MIRALAX) packet 17 g: 17 g | GASTROENTERAL

## 2020-07-16 MED ADMIN — CISATRacurium (NIMBEX) injection 10 mg: 10 mg | INTRAVENOUS | @ 10:00:00 | Stop: 2020-07-16

## 2020-07-16 MED ADMIN — escitalopram oxalate (LEXAPRO) tablet 20 mg: 20 mg | GASTROENTERAL | @ 13:00:00

## 2020-07-16 MED ADMIN — sennosides (SENOKOT) oral syrup: 10 mL | GASTROENTERAL

## 2020-07-16 MED ADMIN — QUEtiapine (SEROquel) tablet 50 mg: 50 mg | GASTROENTERAL | @ 13:00:00

## 2020-07-16 MED ADMIN — HYDROmorphone 1 mg/mL in 0.9% sodium chloride: 0-14 mg/h | INTRAVENOUS | Stop: 2020-07-16

## 2020-07-16 MED ADMIN — LORazepam (ATIVAN) tablet 8 mg: 8 mg | GASTROENTERAL | @ 05:00:00

## 2020-07-16 MED ADMIN — LORazepam (ATIVAN) tablet 8 mg: 8 mg | GASTROENTERAL | @ 13:00:00

## 2020-07-16 MED ADMIN — oxyCODONE (ROXICODONE) immediate release tablet 60 mg: 60 mg | GASTROENTERAL | @ 18:00:00 | Stop: 2020-07-30

## 2020-07-16 MED ADMIN — NxStage RFP 401 (+/- BB) 5000 mL - contains 4 mEq/L of potassium dialysis solution 5,000 mL: 5000 mL | INTRAVENOUS_CENTRAL | @ 06:00:00

## 2020-07-16 MED ADMIN — famotidine (PEPCID) tablet 20 mg: 20 mg | GASTROENTERAL | @ 13:00:00

## 2020-07-16 MED ADMIN — oxyCODONE (ROXICODONE) immediate release tablet 60 mg: 60 mg | GASTROENTERAL | @ 21:00:00 | Stop: 2020-07-30

## 2020-07-16 MED ADMIN — ketamine 1,000 mg in sodium chloride 0.9 % 100 mL (10 mg/mL) infusion: 0-10 mg/kg/h | INTRAVENOUS | @ 13:00:00

## 2020-07-16 MED ADMIN — PHENobarbital 350 mg in sodium chloride (NS) 0.9 % 50 mL IVPB: 350 mg | INTRAVENOUS | @ 02:00:00 | Stop: 2020-07-15

## 2020-07-16 MED ADMIN — VECuronium (NORCURON) injection 10 mg: 10 mg | INTRAVENOUS | @ 04:00:00 | Stop: 2020-07-16

## 2020-07-16 MED ADMIN — LORazepam (ATIVAN) tablet 8 mg: 8 mg | GASTROENTERAL

## 2020-07-16 MED ADMIN — tacrolimus (PROGRAF) oral suspension: .5 mg | ORAL

## 2020-07-16 MED ADMIN — QUEtiapine (SEROquel) tablet 50 mg: 50 mg | GASTROENTERAL

## 2020-07-16 MED ADMIN — VECuronium (NORCURON) 10 mg injection: INTRAVENOUS | @ 04:00:00 | Stop: 2020-07-16

## 2020-07-16 MED ADMIN — QUEtiapine (SEROquel) tablet 50 mg: 50 mg | GASTROENTERAL | @ 18:00:00

## 2020-07-16 MED ADMIN — oxyCODONE (ROXICODONE) immediate release tablet 60 mg: 60 mg | GASTROENTERAL | @ 13:00:00 | Stop: 2020-07-30

## 2020-07-16 MED ADMIN — HYDROmorphone 1 mg/mL in 0.9% sodium chloride: 0-14 mg/h | INTRAVENOUS | @ 18:00:00 | Stop: 2020-07-29

## 2020-07-16 MED ADMIN — cefepime (MAXIPIME) 2 g in dextrose 100 mL IVPB (premix): 2 g | INTRAVENOUS | @ 02:00:00 | Stop: 2020-07-17

## 2020-07-16 MED ADMIN — VECuronium 100 mg in 100 mL (1 mg/mL) infusion: 0-3 ug/kg/min | INTRAVENOUS | @ 13:00:00

## 2020-07-16 MED ADMIN — CISATRacurium (NIMBEX) injection 10 mg: 10 mg | INTRAVENOUS | @ 09:00:00 | Stop: 2020-07-16

## 2020-07-16 MED ADMIN — oxyCODONE (ROXICODONE) immediate release tablet 60 mg: 60 mg | GASTROENTERAL | @ 02:00:00 | Stop: 2020-07-17

## 2020-07-16 MED ADMIN — LORazepam (ATIVAN) tablet 8 mg: 8 mg | GASTROENTERAL | @ 07:00:00

## 2020-07-16 MED ADMIN — chlorhexidine (PERIDEX) 0.12 % solution 5 mL: 5 mL | OROMUCOSAL | @ 13:00:00

## 2020-07-16 MED ADMIN — sodium phosphate 30 mmol in dextrose 5 % 250 mL IVPB: 30 mmol | INTRAVENOUS | @ 16:00:00 | Stop: 2020-07-16

## 2020-07-16 MED ADMIN — metoclopramide (REGLAN) injection 10 mg: 10 mg | INTRAVENOUS | @ 05:00:00

## 2020-07-16 MED ADMIN — levothyroxine (SYNTHROID) tablet 100 mcg: 100 ug | GASTROENTERAL | @ 09:00:00

## 2020-07-16 MED ADMIN — white petrolatum-mineral oiL (SOOTHE PM) 80-20 % ophthalmic ointment 1 application: 1 | OPHTHALMIC | @ 02:00:00

## 2020-07-16 MED ADMIN — LORazepam (ATIVAN) tablet 8 mg: 8 mg | GASTROENTERAL | @ 19:00:00

## 2020-07-16 MED ADMIN — midazolam in sodium chloride 0.9% (1 mg/mL) infusion PMB: 0-14 mg/h | INTRAVENOUS | @ 17:00:00

## 2020-07-16 MED ADMIN — magnesium sulfate 2gm/50mL IVPB: 2 g | INTRAVENOUS | @ 13:00:00 | Stop: 2020-07-16

## 2020-07-16 MED ADMIN — pravastatin (PRAVACHOL) tablet 40 mg: 40 mg | GASTROENTERAL | @ 13:00:00

## 2020-07-16 MED ADMIN — NxStage RFP 401 (+/- BB) 5000 mL - contains 4 mEq/L of potassium dialysis solution 5,000 mL: 5000 mL | INTRAVENOUS_CENTRAL | @ 10:00:00

## 2020-07-16 MED ADMIN — levETIRAcetam (KEPPRA) tablet 500 mg: 500 mg | GASTROENTERAL | @ 13:00:00

## 2020-07-16 MED ADMIN — polyethylene glycol (MIRALAX) packet 17 g: 17 g | GASTROENTERAL | @ 13:00:00

## 2020-07-16 MED ADMIN — LORazepam (ATIVAN) tablet 8 mg: 8 mg | GASTROENTERAL | @ 16:00:00

## 2020-07-16 MED ADMIN — VECuronium (NORCURON) injection 10 mg: 10 mg | INTRAVENOUS | @ 22:00:00

## 2020-07-16 MED ADMIN — oxyCODONE (ROXICODONE) immediate release tablet 60 mg: 60 mg | GASTROENTERAL | @ 07:00:00 | Stop: 2020-07-30

## 2020-07-16 MED ADMIN — sodium chloride (NS) 0.9 % infusion: INTRAVENOUS | @ 13:00:00

## 2020-07-16 MED ADMIN — HYDROmorphone 1 mg/mL in 0.9% sodium chloride: 0-14 mg/h | INTRAVENOUS | @ 12:00:00 | Stop: 2020-07-29

## 2020-07-16 MED ADMIN — HYDROmorphone (PF) (DILAUDID) injection 4 mg: 4 mg | INTRAVENOUS | @ 07:00:00

## 2020-07-16 MED ADMIN — buPROPion (WELLBUTRIN) tablet 100 mg: 100 mg | GASTROENTERAL

## 2020-07-16 MED ADMIN — heparin (porcine) 7,500 units/0.75 mL syringe: 7500 [IU] | SUBCUTANEOUS | @ 18:00:00

## 2020-07-16 MED ADMIN — VECuronium (NORCURON) injection 10 mg: 10 mg | INTRAVENOUS | @ 13:00:00 | Stop: 2020-07-16

## 2020-07-16 MED ADMIN — HYDROmorphone 1 mg/mL in 0.9% sodium chloride: 0-14 mg/h | INTRAVENOUS | @ 15:00:00 | Stop: 2020-07-29

## 2020-07-16 MED ADMIN — HYDROmorphone 1 mg/mL in 0.9% sodium chloride: 0-14 mg/h | INTRAVENOUS | @ 21:00:00 | Stop: 2020-07-29

## 2020-07-16 MED ADMIN — metoclopramide (REGLAN) injection 10 mg: 10 mg | INTRAVENOUS | @ 16:00:00

## 2020-07-16 MED ADMIN — metoclopramide (REGLAN) injection 10 mg: 10 mg | INTRAVENOUS | @ 09:00:00

## 2020-07-16 MED ADMIN — midazolam in sodium chloride 0.9% (1 mg/mL) infusion PMB: 0-14 mg/h | INTRAVENOUS | @ 12:00:00

## 2020-07-16 MED ADMIN — CISATRacurium (NIMBEX) injection 10 mg: 10 mg | INTRAVENOUS | @ 07:00:00 | Stop: 2020-07-16

## 2020-07-16 NOTE — Unmapped (Signed)
MICU Daily Progress Note     Date of Service: 07/16/2020    Problem List:   Principal Problem:    Acute hypoxemic respiratory failure due to severe acute respiratory syndrome coronavirus 2 (SARS-CoV-2) disease (CMS-HCC)  Active Problems:    Liver transplant recipient (CMS-HCC)    Morbid obesity with BMI of 40.0-44.9, adult (CMS-HCC)    Acute renal failure (ARF) (CMS-HCC)  Resolved Problems:    * No resolved hospital problems. *      Interval history:  Evelyn Rollins is a 54 y.o. female with PMH cirrhosis status post liver transplant 2016 on chronic immunosuppressive therapy, hypertension, diabetes, hypothyroidism who presented to [facility] on 7/22 with myalgias, sore throat, loss of taste and fevers  x5 days. When her symptoms had started, she took a home Covid test on 7/17 which was found to be positive. She has had poor p.o. intake for the last several days. Her husband also has similar symptoms. In the OSH ED she was found to be severely hypoxic with O2 saturations of 50% on room air. She was placed on a nonrebreather and current saturations increased to the low 90s. Chest x-ray shows bilateral infiltrates consistent with COVID-19 pneumonia. Her Covid test is positive. Inflammatory markers are elevated. She received a dose of Decadron in the emergency room.    ON events:   -remained prone  - still triggering vent despite increased sedation and parlytic pushes. Given dose of phenobarbital     Neurological   Analgesia and Sedation  -continue dilaudid and versed gtt  -cis gtt (7/31 - 8/5) vec gtt 8/5-->  - trial of ketamine gtt   -PRN dilaudid and versed  -ativan 8q4h, oxy 60q4h  - seroquel 50 mg tid  ??  Anxiety, depression  -restarted home meds Wellbutrin/Lexapro, and Seroquel in place of abilify  ??  Seizure history (not taking AEDs), ?seizure activity  -neurology consult >> c/f RUE weakness, MRI 7/27- no acute pathology  -vEEG negative for seizure x 48hrs >> discont monitoring per neuro 7/25  - MRI with possible sz focus,  keppra 500 mg daily per neuro recommendations       Pulmonary   ARDS, Acute resp failure r/t COVID, pnuemomediastinum .  Intubated 7/22   -worsening oxygenation and airway pressures higher  - prone since 430pm on 8/2  - remains paralyzed and sedated.  desats to 60's with movement and taking longer to recover.    - family aware of tenuous status.      Cardiovascular   h/o HTN   - holding home antihtn  - last echo 2016 w nml LVEF, enlrgd RV and deprssed RV fxn w mild phtn  -continue statin     Hypotension  -continue levophed for MAP goal >65    Renal   ARF on CRRT:  - cont foley for accurate I/O during critical illness   - replete electrolytes prn and monitor daily  - nephrology following  - CRRT goal UF 150--> 200 hr--> 0 during episodes of hypotension.   - goal = to neg     Infectious Disease/Autoimmune   COVID 19 infection  Symptom onset:??7/17  Exposure: unknown  COVID PCR+: 7/17  OSH Admission:??7/22  Day of admission/transfer: 7/22  OSH Meds Given:??dexamethasone  COVID Specific??Meds:??dexamethasone 7/22, remdesivir 7/22  Vaccination status: not vaccinated  ??  Monitoring:  - Special airborne/contact precautions (If unavailable, droplet & contact precautions)  - f/u daily CMP, CBC w/ diff, CRP, D-dimer, DIC, troponin, pro-BNP with weekly ferritin,  LDH and cytokine level  - f/u q6h lactate; ABG  ??  Medications:  - completed remdesivir  - cont decadron 6mg  po  daily x 10d, started 7/22  ??  CAP/VAP: MSSA   - febrile, leukocytosis   - started cefep/vanc 7/25--> changed to ancef on 7/27 x7 days  - pan cx for T>38.2   - wbc count rising, hypoxia worsening, broad spectrum abx   (vanc 8/1-8/4)  Cefepime 8/1--> : plan for 14 day course for VAP    Cultures:  Blood Culture, Routine (no units)   Date Value   07/12/2020 No Growth at 4 days   07/12/2020 No Growth at 4 days     Lower Respiratory Culture (no units)   Date Value   07/12/2020 OROPHARYNGEAL FLORA ISOLATED   07/04/2020 3+ Methicillin-Susceptible Staphylococcus aureus (A)     WBC (10*9/L)   Date Value   07/16/2020 12.6 (H)     WBC, UA (/HPF)   Date Value   07/12/2020 7 (H)          FEN/GI   -trickle tube feeds > high residuals, continue reglan  -bowel regimen w senna and miralax, FMS with stool  -PPI  ??  Immunocompromised s/p Liver transplant in 2016  -restarted tacro 1mg /0.5mg   - f/u tacro level  -HOLD cellcept    Heme/Coag   Hypercoagulable state 2/2 covid  -anticoagulation group C heparin  -LE dopplers neg for DVT 7/24  -no evid bleeding  - anemia: h/h 6.8/21.9: tx 2 units PRBC     Endocrine   History of DM2  -insulin gtt stopped  -ISS  -NPH 5 bid    Hypothyroidism  -continue synthroid  ??  Integumentary   - WOCN consulted for high risk skin assessment Yes.  - WOCN recs >>Mepilex Xt cut to fit above new ET trial holder, proning protocol in place, interdry can be used to folds  - cont pressure mitigating precautions per skin policy    Prophylaxis/LDA/Restraints/Consults   Can CVC be removed? No: need for medications requiring central access (e.g. pressors)   Can A-line be removed? No: frequent ABGs  Can Foley be removed? No: Need continuous I/O  Mobility plan: Step 1 - Range of motion    Feeding: Trickle feeds, advance as tolerated  Analgesia: No pain issues  Sedation SAT/SBT: No PEEP > 8  Thromboembolic ppx: Enoxaparin  Head of bed >30 degrees: Yes  Ulcer ppx: Yes, coagulopathy  Glucose within target range: Yes, in range    Does patient need/have an active type/screen? NA    RASS at goal? Yes  [place] Agitation Assessment Scale (RASS) : -5 (07/16/2020 10:46 AM)     Can antipsychotics be stopped? N/A, not on antipsychotics  CAM-ICU Result: Positive (07/14/2020  8:00 AM)      Would hospice care be appropriate for this patient? No, patient improving or expected to improve    Patient Lines/Drains/Airways Status    Active Active Lines, Drains, & Airways     Name:   Placement date:   Placement time:   Site:   Days:    ETT  7.5   06/20/2020    1700     13    CVC Triple Lumen 07/09/2020 Non-tunneled Right Internal jugular   06/22/2020    1945    Internal jugular   13    Hemodialysis Catheter With Distal Infusion Port 07/09/20 Left Internal jugular 1.4 mL 1.4 mL   07/09/20    1700  Internal jugular   6    NG/OG Tube Decompression;Feedings Center mouth   07/03/20    0610    Center mouth   13    Urethral Catheter   July 21, 2020    ???    ???   14    Arterial Line 07-21-20 Right Radial   2020-07-21    2315    Radial   13              Patient Lines/Drains/Airways Status    Active Wounds     Name:   Placement date:   Placement time:   Site:   Days:    Wound 07/11/20 Pressure Injury Face Left cheek under prev ET tube holder- pt was proned DTI   07/11/20    1100    Face   5    Wound 07/13/20 Pressure Injury Nose from ET holder. pt was proned  DTI   07/13/20    1254    Nose   2    Wound 07/13/20 Pressure Injury Other (Comment) Right cheek r/t proning DTI   07/13/20    1255    Other (Comment)   2                Goals of Care     Code Status: Full Code    Designated Healthcare Decision Maker:  Ms. Anthis current decisional capacity for healthcare decision-making is incapacitated. Her designated Educational psychologist) is/are her husband Vernia Buff .      Subjective     Intubated, sedated, prone    Objective     Vitals - past 24 hours  Temp:  [35.9 ??C (96.6 ??F)-36.4 ??C (97.6 ??F)] 36.1 ??C (97 ??F)  Heart Rate:  [77-91] 79  SpO2 Pulse:  [77-108] 79  Resp:  [19-33] 33  FiO2 (%):  [90 %-100 %] 100 %  SpO2:  [86 %-98 %] 93 % Intake/Output  I/O last 3 completed shifts:  In: 4108 [I.V.:1318; NG/GT:2490; IV Piggyback:300]  Out: 5271 [Urine:15; ZOXWR:6045; Stool:520]     ??  Physical Exam:   General: NAD, well nourished, obese, intubated, sedated, prone  HEENT:  normocephalic, trachea mdl, anicteric, no scleral edema, no conjuctival erythema/drainage, MMM and pink   CV: RRR. S1 and S2 normal, no m/r/c/g. no bruit, or JVD. DP Pulses 2  equal. 1+ BLE edema.  Lungs:   chest rise symm, diminished bases. CTA bilat, no wheezes/crackles/rhonchi. Good air movement.  Skin:   w/d/i, no jaundice present, no rashes, lesions, petechiae or breakdown. no drng, erythema.    Abd: contour, abdomen soft, non-tender and not distended. Normoactive bowel sounds x 4 quads, no rebound tenderness or guarding.   Ext: No cyanosis, bruising, clubbing or edema.   Neuro: perrl 3Sl, mdl, orbital edema, +c/c/g,     Continuous Infusions:   ??? HYDROmorphone 14 mg/hr (07/16/20 1046)   ??? ketamine Stopped (07/16/20 0936)   ??? midazolam (1 mg/mL) infusion 14 mg/hr (07/16/20 0529)   ??? norepinephrine bitartrate-NS 2 mcg/min (07/16/20 1020)   ??? NxStage RFP 400 (+/- BB) 5000 mL - contains 2 mEq/L of potassium     ??? NxStage RFP 401 (+/- BB) 5000 mL - contains 4 mEq/L of potassium     ??? sodium chloride     ??? vecuronium infusion 100 mg/100 mL (1 mg/mL) 1.509 mcg/kg/min (07/16/20 1000)       Scheduled Medications:   ??? buPROPion  100 mg Enteral tube: gastric  TID   ??? Cefepime  2 g Intravenous Q12H   ??? chlorhexidine  5 mL Mouth BID   ??? escitalopram oxalate  20 mg Enteral tube: gastric  Daily   ??? famotidine  20 mg Enteral tube: gastric  Daily   ??? heparin (porcine) for subcutaneous use  7,500 Units Subcutaneous [facility]   ??? insulin NPH  5 Units Subcutaneous Q12H [facility]   ??? insulin regular  0-12 Units Subcutaneous Q6H [facility]   ??? levETIRAcetam  500 mg Enteral tube: gastric  Daily   ??? levothyroxine  100 mcg Enteral tube: gastric  daily   ??? LORazepam  8 mg Enteral tube: gastric  Q4H   ??? metoclopramide  10 mg Intravenous Q6H [facility]   ??? oxyCODONE  60 mg Enteral tube: gastric  Q4H   ??? polyethylen glycol  17 g Enteral tube: gastric  TID   ??? pravastatin  40 mg Enteral tube: gastric  Daily   ??? QUEtiapine  50 mg Enteral tube: gastric  TID   ??? sennosides  10 mL Enteral tube: gastric  Nightly   ??? sodium bicarbonate       ??? sodium phosphate  30 mmol Intravenous Once   ??? Tacrolimus  1 mg Enteral tube: gastric  Daily   ??? Tacrolimus  0.5 mg Oral Nightly (2000)   ??? white petrolatum-mineral oiL 1 application Both Eyes BID       PRN medications:  CISATRacurium, dextrose 50 % in water (D50W), HYDROmorphone, midazolam    Data/Imaging Review: Reviewed in Epic and personally interpreted on 07/16/2020. See EMR for detailed results.      Critical Care Attestation     This patient is critically ill or injured with the impairment of vital organ systems such that there is a high probability of imminent or life threatening deterioration in the patient's condition. This patient must remain in the ICU for ongoing evaluation of the comprehensive management plan outlined in this note. I directly provided critical care services as documented in this note and the critical care time spent (45 min) is exclusive of separately billable procedures.    Nichlos Kunzler Paulino Door, PA

## 2020-07-16 NOTE — Unmapped (Signed)
Continuous Renal Replacement  Dialysis Nurse Therapy Procedure Note    Treatment Type:  [facility] Number Of Days On Therapy:   Procedure Date:  07/15/2020 6:52 PM     TREATMENT STATUS:  Restarted  Patient and Treatment Status     None          Active Dialysis Orders (168h ago, onward)     Start     Ordered    07/14/20 1024  CRRT Orders - NxStage (Adult)  Continuous     Comments: Fluid Removal Rate parameters:  MAP   <67mmHg 10 mL/hr;  MAP   >65 mmHg 50-200 mL/hr;   Question Answer Comment   CRRT System: NxStage    Modality: CVVH    Access: Left Internal Jugular    BFR (mL/min): 200-350    Dialysate Flow Rate (mL/kg/hr): Other (Specify) 2.7L       07/14/20 1023              SYSTEM CHECK:  Machine Name: [id number]  Dialyzer: [id number]   Self Test Completed: Yes.        Alarms Connected To The Wall And Active:  No.    VITAL SIGNS:  Temp:  [36.1 ??C (96.9 ??F)-37.4 ??C (99.3 ??F)] 36.4 ??C (97.5 ??F)  Heart Rate:  [71-101] 85  SpO2 Pulse:  [67-100] 85  Resp:  [33] 33  SpO2:  [88 %-97 %] 95 %  A BP-2: (103-273)/(41-114) 127/52  MAP:  [57 mmHg-160 mmHg] 72 mmHg    ACCESS SITE:        Hemodialysis Catheter With Distal Infusion Port 07/09/20 Left Internal jugular 1.4 mL 1.4 mL (Active)   Site Assessment Clean;Dry;Intact 07/15/20 1200   Proximal Lumen Status / Patency Blood Return - Brisk 07/15/20 1200   Proximal Lumen Intervention Accessed 07/15/20 1200   Medial Lumen Status / Patency Blood Return - Brisk 07/15/20 1200   Medial Lumen Intervention Accessed 07/15/20 1200   Lumen 3, Distal Status / Patency Blood Return - Brisk 07/15/20 1200   Distal Lumen Intervention Flushed 07/15/20 1200   Exposed Catheter Length (cm) 0 cm 07/09/20 1700   IV Tubing and Needleless Injector Cap Change Due 07/14/20 07/14/20 1600   Dressing Type CHG gel;Transparent;Occlusive 07/15/20 1200   Dressing Status      Intact/not removed 07/15/20 1200   Dressing Intervention No intervention needed 07/15/20 1200   Contraindicated due to: Dressing Intact surrounding insertion site 07/15/20 1200   Dressing Change Due 07/30/2020 07/15/20 1200   Line Necessity Reviewed? Y 07/15/20 1200   Line Necessity Indications Yes - Hemodialysis 07/15/20 1200   Line Necessity Reviewed With MDI 07/15/20 1200          CATHETER FILL VOLUMES:     Arterial: 1.4 mL  Venous: 1.4 mL     Lab Results   Component Value Date    NA 136 07/15/2020    K 4.4 07/15/2020    CL 103 07/15/2020    CO2 28.0 07/15/2020    BUN 16 07/15/2020     Lab Results   Component Value Date    CALCIUM 8.9 07/15/2020    CAION 5.25 07/15/2020    PHOS 2.1 (L) 07/15/2020    MG 1.6 07/15/2020        SETTINGS:  Blood Pump Rate: 250 mL/min  Replacement Fluid Rate:     Pre-Blood Pump Fluid Rate:    Hourly Fluid Removal Rate: 10 mL/hr   Dialysate Fluid Rate    Therapy Fluid Temperature:  ANTICOAGULANT:  None    ADDITIONAL COMMENTS:  None    HEMODIALYSIS ON-CALL NURSE PAGER NUMBER:  ?? Monday thru Saturday 0700 - 1730: Call the Dialysis Unit ext. [phone]   ?? After 1730 and all day Sunday: Call the Dialysis RN Pager Number [phone]     PROCEDURE REVIEW, VERIFICATION, HANDOFF:  CRRT settings verified, procedure reviewed, and instructions given to primary RN.     Primary CRRT RN Verifying: Peri Jefferson RN Dialysis RN Verifying: Jake Seats, RN

## 2020-07-16 NOTE — Unmapped (Signed)
Tacrolimus Therapeutic Monitoring Pharmacy Note    Evelyn Rollins is a 54 y.o. female continuing tacrolimus.     Indication: Liver transplant     Date of Transplant: 06/2015      Prior Dosing Information: Current regimen 1mg  + 0.5mg       Goals:  Therapeutic Drug Levels  Tacrolimus trough goal: 3-5 ng/mL    Additional Clinical Monitoring/Outcomes  ?? Monitor renal function (SCr and urine output) and liver function (LFTs)  ?? Monitor for signs/symptoms of adverse events (e.g., hyperglycemia, hyperkalemia, hypomagnesemia, hypertension, headache, tremor)    Results:   Tacrolimus level: 3.8 ng/mL, drawn appropriately    Pharmacokinetic Considerations and Significant Drug Interactions:  ??? Concurrent hepatotoxic medications: None identified  ??? Concurrent CYP3A4 substrates/inhibitors: None identified  ??? Concurrent nephrotoxic medications: None identified    Assessment/Plan:  Recommendedation(s)  ??? Continue current regimen of 1mg  AM + 0.5mg  PM    Follow-up  ??? Daily levels until stable.   ??? A pharmacist will continue to monitor and recommend levels as appropriate    Please page service pharmacist with questions/clarifications.    Charleen Kirks, PharmD

## 2020-07-17 LAB — BLOOD GAS CRITICAL CARE PANEL, ARTERIAL
BASE EXCESS ARTERIAL: 0.9 (ref -2.0–2.0)
BASE EXCESS ARTERIAL: 0.8 (ref -2.0–2.0)
BASE EXCESS ARTERIAL: 1.7 (ref -2.0–2.0)
BASE EXCESS ARTERIAL: 1.1 (ref -2.0–2.0)
CALCIUM IONIZED ARTERIAL (MG/DL): 5.04 mg/dL (ref 4.40–5.40)
CALCIUM IONIZED ARTERIAL (MG/DL): 5.11 mg/dL (ref 4.40–5.40)
CALCIUM IONIZED ARTERIAL (MG/DL): 4.94 mg/dL (ref 4.40–5.40)
CALCIUM IONIZED ARTERIAL (MG/DL): 5.27 mg/dL (ref 4.40–5.40)
GLUCOSE WHOLE BLOOD: 120 mg/dL (ref 70–179)
GLUCOSE WHOLE BLOOD: 124 mg/dL (ref 70–179)
GLUCOSE WHOLE BLOOD: 101 mg/dL (ref 70–179)
GLUCOSE WHOLE BLOOD: 103 mg/dL (ref 70–179)
GLUCOSE WHOLE BLOOD: 107 mg/dL (ref 70–179)
HCO3 ARTERIAL: 29 mmol/L — ABNORMAL HIGH (ref 22–27)
HCO3 ARTERIAL: 26 mmol/L (ref 22–27)
HCO3 ARTERIAL: 27 mmol/L (ref 22–27)
HEMOGLOBIN BLOOD GAS: 9 g/dL — ABNORMAL LOW (ref 12.00–16.00)
HEMOGLOBIN BLOOD GAS: 7.9 g/dL — ABNORMAL LOW (ref 12.00–16.00)
HEMOGLOBIN BLOOD GAS: 10.9 g/dL — ABNORMAL LOW (ref 12.00–16.00)
HEMOGLOBIN BLOOD GAS: 9.3 g/dL — ABNORMAL LOW (ref 12.00–16.00)
LACTATE BLOOD ARTERIAL: 1.6 mmol/L — ABNORMAL HIGH (ref <1.3)
LACTATE BLOOD ARTERIAL: 1.4 mmol/L — ABNORMAL HIGH (ref <1.3)
LACTATE BLOOD ARTERIAL: 1.4 mmol/L — ABNORMAL HIGH (ref <1.3)
LACTATE BLOOD ARTERIAL: 1.5 mmol/L — ABNORMAL HIGH (ref <1.3)
LACTATE BLOOD ARTERIAL: 1.4 mmol/L — ABNORMAL HIGH (ref <1.3)
O2 SATURATION ARTERIAL: 95.1 % (ref 94.0–100.0)
O2 SATURATION ARTERIAL: 89.8 % — ABNORMAL LOW (ref 94.0–100.0)
O2 SATURATION ARTERIAL: 90.8 % — ABNORMAL LOW (ref 94.0–100.0)
O2 SATURATION ARTERIAL: 90.5 % — ABNORMAL LOW (ref 94.0–100.0)
PCO2 ARTERIAL: 62.7 mmHg — ABNORMAL HIGH (ref 35.0–45.0)
PCO2 ARTERIAL: 62.8 mmHg — ABNORMAL HIGH (ref 35.0–45.0)
PCO2 ARTERIAL: 60.2 mmHg — ABNORMAL HIGH (ref 35.0–45.0)
PH ARTERIAL: 7.26 — ABNORMAL LOW (ref 7.35–7.45)
PH ARTERIAL: 7.26 — ABNORMAL LOW (ref 7.35–7.45)
PH ARTERIAL: 7.29 — ABNORMAL LOW (ref 7.35–7.45)
PH ARTERIAL: 7.28 — ABNORMAL LOW (ref 7.35–7.45)
PH ARTERIAL: 7.27 — ABNORMAL LOW (ref 7.35–7.45)
PO2 ARTERIAL: 81.1 mmHg (ref 80.0–110.0)
PO2 ARTERIAL: 62.1 mmHg — ABNORMAL LOW (ref 80.0–110.0)
PO2 ARTERIAL: 62.4 mmHg — ABNORMAL LOW (ref 80.0–110.0)
POTASSIUM WHOLE BLOOD: 4.4 mmol/L (ref 3.4–4.6)
POTASSIUM WHOLE BLOOD: 4.6 mmol/L (ref 3.4–4.6)
POTASSIUM WHOLE BLOOD: 4.7 mmol/L — ABNORMAL HIGH (ref 3.4–4.6)
SODIUM WHOLE BLOOD: 136 mmol/L (ref 135–145)
SODIUM WHOLE BLOOD: 137 mmol/L (ref 135–145)
SODIUM WHOLE BLOOD: 138 mmol/L (ref 135–145)
SODIUM WHOLE BLOOD: 136 mmol/L (ref 135–145)
SODIUM WHOLE BLOOD: 138 mmol/L (ref 135–145)

## 2020-07-17 LAB — MAGNESIUM
Magnesium:MCnc:Pt:Ser/Plas:Qn:: 1.6
Magnesium:MCnc:Pt:Ser/Plas:Qn:: 1.9
Magnesium:MCnc:Pt:Ser/Plas:Qn:: 1.9
Magnesium:MCnc:Pt:Ser/Plas:Qn:: 2.2

## 2020-07-17 LAB — COMPREHENSIVE METABOLIC PANEL
ALBUMIN: 1.7 g/dL — ABNORMAL LOW (ref 3.4–5.0)
ALBUMIN: 1.8 g/dL — ABNORMAL LOW (ref 3.4–5.0)
ALBUMIN: 1.8 g/dL — ABNORMAL LOW (ref 3.4–5.0)
ALKALINE PHOSPHATASE: 69 U/L (ref 46–116)
ALKALINE PHOSPHATASE: 72 U/L (ref 46–116)
ALKALINE PHOSPHATASE: 73 U/L (ref 46–116)
ALT (SGPT): 10 U/L (ref 10–49)
ALT (SGPT): 13 U/L (ref 10–49)
ALT (SGPT): 13 U/L (ref 10–49)
ANION GAP: 4 mmol/L — ABNORMAL LOW (ref 5–14)
ANION GAP: 3 mmol/L — ABNORMAL LOW (ref 5–14)
ANION GAP: 4 mmol/L — ABNORMAL LOW (ref 5–14)
AST (SGOT): 28 U/L (ref <=34)
AST (SGOT): 31 U/L (ref <=34)
AST (SGOT): 33 U/L (ref <=34)
BILIRUBIN TOTAL: 0.2 mg/dL — ABNORMAL LOW (ref 0.3–1.2)
BILIRUBIN TOTAL: 0.2 mg/dL — ABNORMAL LOW (ref 0.3–1.2)
BLOOD UREA NITROGEN: 15 mg/dL (ref 9–23)
BLOOD UREA NITROGEN: 14 mg/dL (ref 9–23)
BLOOD UREA NITROGEN: 15 mg/dL (ref 9–23)
BUN / CREAT RATIO: 14
BUN / CREAT RATIO: 13
BUN / CREAT RATIO: 14
CALCIUM: 8.2 mg/dL — ABNORMAL LOW (ref 8.7–10.4)
CALCIUM: 8.6 mg/dL — ABNORMAL LOW (ref 8.7–10.4)
CALCIUM: 8.6 mg/dL — ABNORMAL LOW (ref 8.7–10.4)
CHLORIDE: 107 mmol/L (ref 98–107)
CHLORIDE: 103 mmol/L (ref 98–107)
CO2: 29 mmol/L (ref 20.0–31.0)
CO2: 27 mmol/L (ref 20.0–31.0)
CREATININE: 1.06 mg/dL — ABNORMAL HIGH
CREATININE: 1.09 mg/dL — ABNORMAL HIGH
EGFR CKD-EPI AA FEMALE: 70 mL/min/{1.73_m2} (ref >=60)
EGFR CKD-EPI AA FEMALE: 69 mL/min/{1.73_m2} (ref >=60)
EGFR CKD-EPI AA FEMALE: 67 mL/min/{1.73_m2} (ref >=60)
EGFR CKD-EPI NON-AA FEMALE: 60 mL/min/{1.73_m2} (ref >=60)
EGFR CKD-EPI NON-AA FEMALE: 60 mL/min/{1.73_m2} (ref >=60)
EGFR CKD-EPI NON-AA FEMALE: 58 mL/min/{1.73_m2} — ABNORMAL LOW (ref >=60)
GLUCOSE RANDOM: 113 mg/dL (ref 70–179)
GLUCOSE RANDOM: 102 mg/dL (ref 70–179)
GLUCOSE RANDOM: 101 mg/dL (ref 70–179)
POTASSIUM: 4.4 mmol/L (ref 3.4–4.5)
POTASSIUM: 4.5 mmol/L (ref 3.4–4.5)
POTASSIUM: 4.5 mmol/L (ref 3.4–4.5)
PROTEIN TOTAL: 5.4 g/dL — ABNORMAL LOW (ref 5.7–8.2)
PROTEIN TOTAL: 5.5 g/dL — ABNORMAL LOW (ref 5.7–8.2)
PROTEIN TOTAL: 5.7 g/dL (ref 5.7–8.2)
SODIUM: 138 mmol/L (ref 135–145)

## 2020-07-17 LAB — BASE EXCESS ARTERIAL: Base excess:SCnc:Pt:BldA:Qn:Calculated: 0.4

## 2020-07-17 LAB — HEMOGLOBIN BLOOD GAS
Hemoglobin:MCnc:Pt:Bld:Qn:: 10.1 — ABNORMAL LOW
Hemoglobin:MCnc:Pt:Bld:Qn:: 9 — ABNORMAL LOW

## 2020-07-17 LAB — CBC W/ AUTO DIFF
BASOPHILS ABSOLUTE COUNT: 0.2 10*9/L — ABNORMAL HIGH (ref 0.0–0.1)
BASOPHILS RELATIVE PERCENT: 1.2 %
EOSINOPHILS RELATIVE PERCENT: 1.9 %
HEMATOCRIT: 28.9 % — ABNORMAL LOW (ref 36.0–46.0)
HEMOGLOBIN: 9.2 g/dL — ABNORMAL LOW (ref 12.0–16.0)
LARGE UNSTAINED CELLS: 1 % (ref 0–4)
LYMPHOCYTES ABSOLUTE COUNT: 1 10*9/L — ABNORMAL LOW (ref 1.5–5.0)
MEAN CORPUSCULAR HEMOGLOBIN CONC: 32 g/dL (ref 31.0–37.0)
MEAN CORPUSCULAR HEMOGLOBIN: 30.1 pg (ref 26.0–34.0)
MEAN CORPUSCULAR VOLUME: 94.3 fL (ref 80.0–100.0)
MEAN PLATELET VOLUME: 9.1 fL (ref 7.0–10.0)
MONOCYTES ABSOLUTE COUNT: 0.5 10*9/L (ref 0.2–0.8)
MONOCYTES RELATIVE PERCENT: 3.6 %
NEUTROPHILS ABSOLUTE COUNT: 11.3 10*9/L — ABNORMAL HIGH (ref 2.0–7.5)
NEUTROPHILS RELATIVE PERCENT: 85.1 %
RED BLOOD CELL COUNT: 3.06 10*12/L — ABNORMAL LOW (ref 4.00–5.20)
RED CELL DISTRIBUTION WIDTH: 15.9 % — ABNORMAL HIGH (ref 12.0–15.0)
WBC ADJUSTED: 13.3 10*9/L — ABNORMAL HIGH (ref 4.5–11.0)

## 2020-07-17 LAB — CBC
HEMATOCRIT: 29.7 % — ABNORMAL LOW (ref 36.0–46.0)
HEMATOCRIT: 30.6 % — ABNORMAL LOW (ref 36.0–46.0)
HEMOGLOBIN: 9.5 g/dL — ABNORMAL LOW (ref 12.0–16.0)
MEAN CORPUSCULAR HEMOGLOBIN CONC: 32 g/dL (ref 31.0–37.0)
MEAN CORPUSCULAR HEMOGLOBIN: 29.7 pg (ref 26.0–34.0)
MEAN CORPUSCULAR VOLUME: 94.5 fL (ref 80.0–100.0)
MEAN PLATELET VOLUME: 9.4 fL (ref 7.0–10.0)
PLATELET COUNT: 92 10*9/L — ABNORMAL LOW (ref 150–440)
PLATELET COUNT: 91 10*9/L — ABNORMAL LOW (ref 150–440)
RED BLOOD CELL COUNT: 3.15 10*12/L — ABNORMAL LOW (ref 4.00–5.20)
RED BLOOD CELL COUNT: 3.24 10*12/L — ABNORMAL LOW (ref 4.00–5.20)
RED CELL DISTRIBUTION WIDTH: 16 % — ABNORMAL HIGH (ref 12.0–15.0)
RED CELL DISTRIBUTION WIDTH: 16.2 % — ABNORMAL HIGH (ref 12.0–15.0)
WBC ADJUSTED: 13.3 10*9/L — ABNORMAL HIGH (ref 4.5–11.0)
WBC ADJUSTED: 14.7 10*9/L — ABNORMAL HIGH (ref 4.5–11.0)

## 2020-07-17 LAB — AST (SGOT): Aspartate aminotransferase:CCnc:Pt:Ser/Plas:Qn:: 33

## 2020-07-17 LAB — D-DIMER QUANTITATIVE (CW,ML,HL): Lab: 7355 — ABNORMAL HIGH

## 2020-07-17 LAB — PHOSPHORUS
Phosphate:MCnc:Pt:Ser/Plas:Qn:: 2.6
Phosphate:MCnc:Pt:Ser/Plas:Qn:: 2.7
Phosphate:MCnc:Pt:Ser/Plas:Qn:: 2.8
Phosphate:MCnc:Pt:Ser/Plas:Qn:: 3

## 2020-07-17 LAB — HEPARIN CORRELATION: Lab: 0.2

## 2020-07-17 LAB — TACROLIMUS, TROUGH: Lab: 3.7 — ABNORMAL LOW

## 2020-07-17 LAB — EGFR CKD-EPI AA FEMALE: Glomerular filtration rate/1.73 sq M.predicted.black:ArVRat:Pt:Ser/Plas/Bld:Qn:Creatinine-based formula (CKD-EPI): 70

## 2020-07-17 LAB — PH ARTERIAL: pH:LsCnc:Pt:BldA:Qn:: 7.27 — ABNORMAL LOW

## 2020-07-17 LAB — MEAN CORPUSCULAR HEMOGLOBIN: Erythrocyte mean corpuscular hemoglobin:EntMass:Pt:RBC:Qn:Automated count: 30.2

## 2020-07-17 LAB — RED CELL DISTRIBUTION WIDTH: Lab: 16.2 — ABNORMAL HIGH

## 2020-07-17 LAB — ALT (SGPT): Alanine aminotransferase:CCnc:Pt:Ser/Plas:Qn:: 13

## 2020-07-17 LAB — APTT
APTT: 29.3 s (ref 24.9–36.9)
Coagulation surface induced:Time:Pt:PPP:Qn:Coag: 27.5
Coagulation surface induced:Time:Pt:PPP:Qn:Coag: 30.3

## 2020-07-17 LAB — HYPOCHROMIA

## 2020-07-17 LAB — SODIUM WHOLE BLOOD: Sodium:SCnc:Pt:Bld:Qn:: 138

## 2020-07-17 MED ADMIN — buPROPion (WELLBUTRIN) tablet 100 mg: 100 mg | GASTROENTERAL | @ 12:00:00

## 2020-07-17 MED ADMIN — LORazepam (ATIVAN) tablet 8 mg: 8 mg | GASTROENTERAL | @ 03:00:00

## 2020-07-17 MED ADMIN — NxStage RFP 401 (+/- BB) 5000 mL - contains 4 mEq/L of potassium dialysis solution 5,000 mL: 5000 mL | INTRAVENOUS_CENTRAL | @ 11:00:00

## 2020-07-17 MED ADMIN — heparin (porcine) 7,500 units/0.75 mL syringe: 7500 [IU] | SUBCUTANEOUS | @ 18:00:00

## 2020-07-17 MED ADMIN — metoclopramide (REGLAN) injection 10 mg: 10 mg | INTRAVENOUS | @ 09:00:00

## 2020-07-17 MED ADMIN — midazolam in sodium chloride 0.9% (1 mg/mL) infusion PMB: 0-14 mg/h | INTRAVENOUS | @ 07:00:00

## 2020-07-17 MED ADMIN — NxStage RFP 401 (+/- BB) 5000 mL - contains 4 mEq/L of potassium dialysis solution 5,000 mL: 5000 mL | INTRAVENOUS_CENTRAL | @ 05:00:00

## 2020-07-17 MED ADMIN — HYDROmorphone 1 mg/mL in 0.9% sodium chloride: 0-14 mg/h | INTRAVENOUS | @ 04:00:00 | Stop: 2020-07-29

## 2020-07-17 MED ADMIN — heparin (porcine) 7,500 units/0.75 mL syringe: 7500 [IU] | SUBCUTANEOUS | @ 09:00:00

## 2020-07-17 MED ADMIN — oxyCODONE (ROXICODONE) immediate release tablet 60 mg: 60 mg | GASTROENTERAL | @ 06:00:00 | Stop: 2020-07-17

## 2020-07-17 MED ADMIN — white petrolatum-mineral oiL (SOOTHE PM) 80-20 % ophthalmic ointment 1 application: 1 | OPHTHALMIC | @ 01:00:00

## 2020-07-17 MED ADMIN — levETIRAcetam (KEPPRA) tablet 500 mg: 500 mg | GASTROENTERAL | @ 12:00:00

## 2020-07-17 MED ADMIN — HYDROmorphone 1 mg/mL in 0.9% sodium chloride: 0-14 mg/h | INTRAVENOUS | Stop: 2020-07-29

## 2020-07-17 MED ADMIN — oxyCODONE (ROXICODONE) immediate release tablet 60 mg: 60 mg | GASTROENTERAL | @ 01:00:00 | Stop: 2020-07-30

## 2020-07-17 MED ADMIN — metoclopramide (REGLAN) injection 10 mg: 10 mg | INTRAVENOUS | @ 15:00:00

## 2020-07-17 MED ADMIN — VECuronium (NORCURON) injection 10 mg: 10 mg | INTRAVENOUS | @ 19:00:00

## 2020-07-17 MED ADMIN — QUEtiapine (SEROquel) tablet 50 mg: 50 mg | GASTROENTERAL | @ 18:00:00

## 2020-07-17 MED ADMIN — NxStage RFP 401 (+/- BB) 5000 mL - contains 4 mEq/L of potassium dialysis solution 5,000 mL: 5000 mL | INTRAVENOUS_CENTRAL | @ 21:00:00

## 2020-07-17 MED ADMIN — VECuronium 100 mg in 100 mL (1 mg/mL) infusion: 0-3 ug/kg/min | INTRAVENOUS | @ 06:00:00

## 2020-07-17 MED ADMIN — HYDROmorphone 1 mg/mL in 0.9% sodium chloride: 0-14 mg/h | INTRAVENOUS | @ 10:00:00 | Stop: 2020-07-29

## 2020-07-17 MED ADMIN — escitalopram oxalate (LEXAPRO) tablet 20 mg: 20 mg | GASTROENTERAL | @ 12:00:00

## 2020-07-17 MED ADMIN — NxStage RFP 401 (+/- BB) 5000 mL - contains 4 mEq/L of potassium dialysis solution 5,000 mL: 5000 mL | INTRAVENOUS_CENTRAL

## 2020-07-17 MED ADMIN — HYDROmorphone 1 mg/mL in 0.9% sodium chloride: 0-14 mg/h | INTRAVENOUS | @ 07:00:00 | Stop: 2020-07-29

## 2020-07-17 MED ADMIN — insulin NPH (HumuLIN,NovoLIN) injection 5 Units: 5 [IU] | SUBCUTANEOUS | @ 21:00:00

## 2020-07-17 MED ADMIN — polyethylene glycol (MIRALAX) packet 17 g: 17 g | GASTROENTERAL | @ 18:00:00

## 2020-07-17 MED ADMIN — cefepime (MAXIPIME) 2 g in dextrose 100 mL IVPB (premix): 2 g | INTRAVENOUS | @ 03:00:00 | Stop: 2020-07-16

## 2020-07-17 MED ADMIN — heparin (porcine) 7,500 units/0.75 mL syringe: 7500 [IU] | SUBCUTANEOUS | @ 03:00:00

## 2020-07-17 MED ADMIN — midazolam in sodium chloride 0.9% (1 mg/mL) infusion PMB: 0-14 mg/h | INTRAVENOUS | @ 22:00:00

## 2020-07-17 MED ADMIN — sennosides (SENOKOT) oral syrup: 10 mL | GASTROENTERAL | @ 01:00:00

## 2020-07-17 MED ADMIN — levothyroxine (SYNTHROID) tablet 100 mcg: 100 ug | GASTROENTERAL | @ 09:00:00

## 2020-07-17 MED ADMIN — LORazepam (ATIVAN) tablet 8 mg: 8 mg | GASTROENTERAL | @ 01:00:00

## 2020-07-17 MED ADMIN — oxyCODONE (ROXICODONE) immediate release tablet 60 mg: 60 mg | GASTROENTERAL | @ 15:00:00 | Stop: 2020-07-30

## 2020-07-17 MED ADMIN — LORazepam (ATIVAN) tablet 8 mg: 8 mg | GASTROENTERAL | @ 12:00:00

## 2020-07-17 MED ADMIN — pravastatin (PRAVACHOL) tablet 40 mg: 40 mg | GASTROENTERAL | @ 12:00:00

## 2020-07-17 MED ADMIN — metoclopramide (REGLAN) injection 10 mg: 10 mg | INTRAVENOUS | @ 03:00:00

## 2020-07-17 MED ADMIN — LORazepam (ATIVAN) tablet 8 mg: 8 mg | GASTROENTERAL | @ 19:00:00

## 2020-07-17 MED ADMIN — polyethylene glycol (MIRALAX) packet 17 g: 17 g | GASTROENTERAL | @ 01:00:00

## 2020-07-17 MED ADMIN — buPROPion (WELLBUTRIN) tablet 100 mg: 100 mg | GASTROENTERAL | @ 01:00:00

## 2020-07-17 MED ADMIN — HYDROmorphone 1 mg/mL in 0.9% sodium chloride: 0-14 mg/h | INTRAVENOUS | @ 18:00:00 | Stop: 2020-07-29

## 2020-07-17 MED ADMIN — polyethylene glycol (MIRALAX) packet 17 g: 17 g | GASTROENTERAL | @ 12:00:00

## 2020-07-17 MED ADMIN — HYDROmorphone 1 mg/mL in 0.9% sodium chloride: 0-14 mg/h | INTRAVENOUS | @ 14:00:00 | Stop: 2020-07-29

## 2020-07-17 MED ADMIN — HYDROmorphone 1 mg/mL in 0.9% sodium chloride: 0-14 mg/h | INTRAVENOUS | @ 21:00:00 | Stop: 2020-07-29

## 2020-07-17 MED ADMIN — oxyCODONE (ROXICODONE) immediate release tablet 60 mg: 60 mg | GASTROENTERAL | @ 19:00:00 | Stop: 2020-07-30

## 2020-07-17 MED ADMIN — oxyCODONE (ROXICODONE) immediate release tablet 60 mg: 60 mg | GASTROENTERAL | @ 09:00:00 | Stop: 2020-07-17

## 2020-07-17 MED ADMIN — chlorhexidine (PERIDEX) 0.12 % solution 5 mL: 5 mL | OROMUCOSAL | @ 01:00:00

## 2020-07-17 MED ADMIN — magnesium sulfate 2gm/50mL IVPB: 2 g | INTRAVENOUS | @ 12:00:00 | Stop: 2020-07-17

## 2020-07-17 MED ADMIN — LORazepam (ATIVAN) tablet 8 mg: 8 mg | GASTROENTERAL | @ 15:00:00

## 2020-07-17 MED ADMIN — LORazepam (ATIVAN) tablet 8 mg: 8 mg | GASTROENTERAL | @ 07:00:00

## 2020-07-17 MED ADMIN — VECuronium (NORCURON) injection 10 mg: 10 mg | INTRAVENOUS | @ 06:00:00

## 2020-07-17 NOTE — Unmapped (Signed)
Tacrolimus Therapeutic Monitoring Pharmacy Note    Evelyn Rollins is a 54 y.o. female continuing tacrolimus.     Indication: Liver transplant     Date of Transplant: 06/2015      Prior Dosing Information: Current regimen 1mg  + 0.5mg       Goals:  Therapeutic Drug Levels  Tacrolimus trough goal: 3-5 ng/mL    Additional Clinical Monitoring/Outcomes  ?? Monitor renal function (SCr and urine output) and liver function (LFTs)  ?? Monitor for signs/symptoms of adverse events (e.g., hyperglycemia, hyperkalemia, hypomagnesemia, hypertension, headache, tremor)    Results:   Tacrolimus level: 3.7 ng/mL, drawn appropriately    Pharmacokinetic Considerations and Significant Drug Interactions:  ??? Concurrent hepatotoxic medications: None identified  ??? Concurrent CYP3A4 substrates/inhibitors: None identified  ??? Concurrent nephrotoxic medications: None identified    Assessment/Plan:  Recommendedation(s)  ??? Continue current regimen of 1mg  AM + 0.5mg  PM    Follow-up  ??? Next level Monday, 08/11/2020.   ??? A pharmacist will continue to monitor and recommend levels as appropriate    Please page service pharmacist with questions/clarifications.    Charleen Kirks, PharmD

## 2020-07-17 NOTE — Unmapped (Signed)
Patient paralytic changed to vec from nimbex, attempted to utilize ketamine as well but significant hypotension. Remains on 14/14 dilaudid/versed without signs of breakthrough pain. NSR. 2 of levo for systolic >100. Tube feeds remain at 60, large residuals. FMS with good output. Foley with minimal drainage. CRRT running without issue, UF of 100. Unable to turn, do not turn order in place. Husband and son in to visit.   Problem: Adult Inpatient Plan of Care  Goal: Plan of Care Review  Outcome: Not Progressing  Goal: Patient-Specific Goal (Individualization)  Outcome: Not Progressing  Goal: Absence of Hospital-Acquired Illness or Injury  Outcome: Not Progressing  Goal: Optimal Comfort and Wellbeing  Outcome: Not Progressing  Goal: Readiness for Transition of Care  Outcome: Not Progressing  Goal: Rounds/Family Conference  Outcome: Not Progressing     Problem: Skin Injury Risk Increased  Goal: Skin Health and Integrity  Outcome: Not Progressing     Problem: Infection  Goal: Infection Symptom Resolution  Outcome: Not Progressing     Problem: Fall Injury Risk  Goal: Absence of Fall and Fall-Related Injury  Outcome: Not Progressing     Problem: Wound  Goal: Optimal Wound Healing  Outcome: Not Progressing     Problem: Communication Impairment (Mechanical Ventilation, Invasive)  Goal: Effective Communication  Outcome: Not Progressing     Problem: Device-Related Complication Risk (Mechanical Ventilation, Invasive)  Goal: Optimal Device Function  Outcome: Not Progressing     Problem: Inability to Wean (Mechanical Ventilation, Invasive)  Goal: Mechanical Ventilation Liberation  Outcome: Not Progressing     Problem: Skin and Tissue Injury (Mechanical Ventilation, Invasive)  Goal: Absence of Device-Related Skin and Tissue Injury  Outcome: Not Progressing     Problem: Ventilator-Induced Lung Injury (Mechanical Ventilation, Invasive)  Goal: Absence of Ventilator-Induced Lung Injury  Outcome: Not Progressing     Problem: Self-Care Deficit  Goal: Improved Ability to Complete Activities of Daily Living  Outcome: Not Progressing     Problem: Asthma Comorbidity  Goal: Maintenance of Asthma Control  Outcome: Not Progressing     Problem: COPD Comorbidity  Goal: Maintenance of COPD Symptom Control  Outcome: Not Progressing     Problem: Diabetes Comorbidity  Goal: Blood Glucose Level Within Desired Range  Outcome: Not Progressing     Problem: Heart Failure Comorbidity  Goal: Maintenance of Heart Failure Symptom Control  Outcome: Not Progressing     Problem: Hypertension Comorbidity  Goal: Blood Pressure in Desired Range  Outcome: Not Progressing     Problem: Obstructive Sleep Apnea Risk or Actual (Comorbidity Management)  Goal: Unobstructed Breathing During Sleep  Outcome: Not Progressing     Problem: Pain Chronic (Persistent) (Comorbidity Management)  Goal: Acceptable Pain Control and Functional Ability  Outcome: Not Progressing     Problem: Seizure Disorder Comorbidity  Goal: Maintenance of Seizure Control  Outcome: Not Progressing     Problem: LTC COVID-19 Confirmed or Rule-Out  Goal: Patient/Resident will remain free of complications due to COVID-19  Description: 1. Review and update the patient/resident's isolation status in the Isolation activity  2. Keep the patient/resident's door closed at all times and limit movement of the patient/resident outside of the room to medically essential purposes. If applicable, transfer patient/resident to a single-person room   3. Educate and reinforce infection prevention and control practices recommended by CDC  4. Frequently monitor for development of more severe symptoms   5. Use appropriate PPE when providing care for patient/resident  6. Reinforce no visitor policy and non-essential health care personnel  policy, except for certain compassionate care situations  7. If worsening of symptoms occur, alert the nearest Hospital caring for confirmed COVID-19 patients and arrange for transfer with proper precautions including placing a facemask on the patient/resident during transfer  8. Communicate information about known or suspected case of COVID-19 to appropriate public health personnel  9. Avoid procedures that are likely to induce coughing (e.g., sputum induction, open suctioning of airways). If required, do so in an Airborne Infection Isolation Room. The health care provider in the room should wear an N95 or higher-level respirator, eye protection, gloves, and a gown. The number of HCP present during the procedure should be limited to only those essential for patient/resident care and procedure support. Visitors should not be present for the procedure. Clean and disinfect procedure room surfaces promptly  10. Update patient/resident and family/representatives as needed      Outcome: Not Progressing     Problem: Gas Exchange Impaired  Goal: Optimal Gas Exchange  Outcome: Not Progressing     Problem: Electrolyte Imbalance (Acute Kidney Injury/Impairment)  Goal: Serum Electrolyte Balance  Outcome: Not Progressing     Problem: Fluid Imbalance (Acute Kidney Injury/Impairment)  Goal: Optimal Fluid Balance  Outcome: Not Progressing     Problem: Hematologic Alteration (Acute Kidney Injury/Impairment)  Goal: Hemoglobin, Hematocrit and Platelets Within Normal Range  Outcome: Not Progressing     Problem: Oral Intake Inadequate (Acute Kidney Injury/Impairment)  Goal: Optimal Nutrition Intake  Outcome: Not Progressing     Problem: Renal Function Impairment (Acute Kidney Injury/Impairment)  Goal: Effective Renal Function  Outcome: Not Progressing

## 2020-07-17 NOTE — Unmapped (Signed)
MICU Daily Progress Note     Date of Service: 07/17/2020    Problem List:   Principal Problem:    Acute hypoxemic respiratory failure due to severe acute respiratory syndrome coronavirus 2 (SARS-CoV-2) disease (CMS-HCC)  Active Problems:    Liver transplant recipient (CMS-HCC)    Morbid obesity with BMI of 40.0-44.9, adult (CMS-HCC)    Acute renal failure (ARF) (CMS-HCC)  Resolved Problems:    * No resolved hospital problems. *      Interval history:  Evelyn Rollins is a 54 y.o. female with PMH cirrhosis status post liver transplant 2016 on chronic immunosuppressive therapy, hypertension, diabetes, hypothyroidism who presented to [facility] on 7/22 with myalgias, sore throat, loss of taste and fevers  x5 days. When her symptoms had started, she took a home Covid test on 7/17 which was found to be positive. She has had poor p.o. intake for the last several days. Her husband also has similar symptoms. In the OSH ED she was found to be severely hypoxic with O2 saturations of 50% on room air. She was placed on a nonrebreather and current saturations increased to the low 90s. Chest x-ray shows bilateral infiltrates consistent with COVID-19 pneumonia. Her Covid test is positive. Inflammatory markers are elevated. She received a dose of Decadron in the emergency room.    ON events:   -remained prone/paralayzed   - family visited    Neurological   Analgesia and Sedation  -continue dilaudid and versed gtt  -cis gtt (7/31 - 8/5) vec gtt 8/5-->  -PRN dilaudid and versed  -ativan 8q4h, oxy 60q4h  - seroquel 50 mg tid  ??  Anxiety, depression  -restarted home meds Wellbutrin/Lexapro, and Seroquel in place of abilify  ??  Seizure history (not taking AEDs), ?seizure activity  -neurology consult >> c/f RUE weakness, MRI 7/27- no acute pathology  -vEEG negative for seizure x 48hrs >> discont monitoring per neuro 7/25  - MRI with possible sz focus,  keppra 500 mg daily per neuro recommendations       Pulmonary   ARDS, Acute resp failure r/t COVID, pnuemomediastinum .  Intubated 7/22   -worsening oxygenation and airway pressures higher  - prone since 430pm on 8/2  - remains paralyzed and sedated.  desats to 60's with movement and taking longer to recover.    - family aware of tenuous status and has visited.     Cardiovascular   h/o HTN   - holding home antihtn  - last echo 2016 w nml LVEF, enlrgd RV and deprssed RV fxn w mild phtn  -continue statin     Hypotension  -continue levophed for MAP goal >65    Renal   ARF on CRRT:  - cont foley for accurate I/O during critical illness   - replete electrolytes prn and monitor daily  - nephrology following  - CRRT goal UF 150-  - goal = to neg     Infectious Disease/Autoimmune   COVID 19 infection  Symptom onset:??7/17  Exposure: unknown  COVID PCR+: 7/17  OSH Admission:??7/22  Day of admission/transfer: 7/22  OSH Meds Given:??dexamethasone  COVID Specific??Meds:??dexamethasone 7/22, remdesivir 7/22  Vaccination status: not vaccinated  ??  Monitoring:  - Special airborne/contact precautions (If unavailable, droplet & contact precautions)  - f/u daily CMP, CBC w/ diff, CRP, D-dimer, DIC, troponin, pro-BNP with weekly ferritin, LDH and cytokine level  - f/u q6h lactate; ABG  ??  Medications:  - completed remdesivir  - cont decadron 6mg  po  daily x 10d, started 7/22  ??  CAP/VAP: MSSA   - febrile, leukocytosis   - started cefep/vanc 7/25--> changed to ancef on 7/27 x7 days  - pan cx for T>38.2   - wbc count rising, hypoxia worsening, broad spectrum abx   (vanc 8/1-8/4)  Cefepime 8/1--> : plan for 14 day course for VAP    Cultures:  Blood Culture, Routine (no units)   Date Value   07/12/2020 No Growth at 5 days   07/12/2020 No Growth at 5 days     Lower Respiratory Culture (no units)   Date Value   07/12/2020 OROPHARYNGEAL FLORA ISOLATED   07/04/2020 3+ Methicillin-Susceptible Staphylococcus aureus (A)     WBC (10*9/L)   Date Value   07/17/2020 13.3 (H)     WBC, UA (/HPF)   Date Value   07/12/2020 7 (H) FEN/GI   -trickle tube feeds > high residuals, continue reglan  -bowel regimen w senna and miralax, FMS with stool  -PPI  ??  Immunocompromised s/p Liver transplant in 2016  -restarted tacro 1mg /0.5mg   - f/u tacro level  -HOLD cellcept    Heme/Coag   Hypercoagulable state 2/2 covid  -anticoagulation group C heparin  -LE dopplers neg for DVT 7/24  -no evid bleeding  - anemia: h/h 6.8/21.9: tx 2 units PRBC     Endocrine   History of DM2  -insulin gtt stopped  -ISS  -NPH 5 bid    Hypothyroidism  -continue synthroid  ??  Integumentary   - WOCN consulted for high risk skin assessment Yes.  - WOCN recs >>Mepilex Xt cut to fit above new ET trial holder, proning protocol in place, interdry can be used to folds  - cont pressure mitigating precautions per skin policy    Prophylaxis/LDA/Restraints/Consults   Can CVC be removed? No: need for medications requiring central access (e.g. pressors)   Can A-line be removed? No: frequent ABGs  Can Foley be removed? No: Need continuous I/O  Mobility plan: Step 1 - Range of motion    Feeding: Trickle feeds, advance as tolerated  Analgesia: No pain issues  Sedation SAT/SBT: No PEEP > 8  Thromboembolic ppx: Enoxaparin  Head of bed >30 degrees: Yes  Ulcer ppx: Yes, coagulopathy  Glucose within target range: Yes, in range    Does patient need/have an active type/screen? NA    RASS at goal? Yes  [place] Agitation Assessment Scale (RASS) : -5 (07/17/2020 10:00 AM)     Can antipsychotics be stopped? N/A, not on antipsychotics  CAM-ICU Result: Positive (07/14/2020  8:00 AM)      Would hospice care be appropriate for this patient- yes but too unstable     Patient Lines/Drains/Airways Status    Active Active Lines, Drains, & Airways     Name:   Placement date:   Placement time:   Site:   Days:    ETT  7.5   07/04/2020    1700     14    CVC Triple Lumen 06/17/2020 Non-tunneled Right Internal jugular   06/27/2020    1945    Internal jugular   14    Hemodialysis Catheter With Distal Infusion Port 07/09/20 Left Internal jugular 1.4 mL 1.4 mL   07/09/20    1700    Internal jugular   7    NG/OG Tube Decompression;Feedings Center mouth   07/03/20    0610    Center mouth   14    Urethral Catheter   06/15/2020    ???    ???  15    Arterial Line 06/23/2020 Right Radial   07/06/2020    2315    Radial   14              Patient Lines/Drains/Airways Status    Active Wounds     Name:   Placement date:   Placement time:   Site:   Days:    Wound 07/11/20 Pressure Injury Face Left cheek under prev ET tube holder- pt was proned DTI   07/11/20    1100    Face   6    Wound 07/13/20 Pressure Injury Nose from ET holder. pt was proned  DTI   07/13/20    1254    Nose   3    Wound 07/13/20 Pressure Injury Other (Comment) Right cheek r/t proning DTI   07/13/20    1255    Other (Comment)   3                Goals of Care     Code Status: Full Code    Designated Healthcare Decision Maker:  Ms. Stupka current decisional capacity for healthcare decision-making is incapacitated. Her designated Educational psychologist) is/are her husband Vernia Buff .      Subjective     Intubated, sedated, prone    Objective     Vitals - past 24 hours  Temp:  [36.2 ??C (97.2 ??F)-36.6 ??C (97.9 ??F)] 36.4 ??C (97.5 ??F)  Heart Rate:  [71-82] 82  SpO2 Pulse:  [71-82] 81  Resp:  [27-33] 30  FiO2 (%):  [100 %] 100 %  SpO2:  [87 %-94 %] 91 % Intake/Output  I/O last 3 completed shifts:  In: 5308.5 [I.V.:1559.4; Blood:829.2; NG/GT:2520; IV Piggyback:400]  Out: 3839 [Urine:20; Other:3219; Stool:600]     ??  Physical Exam:   General: NAD, well nourished, obese, intubated, sedated, prone  HEENT:  normocephalic, trachea mdl, anicteric, no scleral edema, no conjuctival erythema/drainage, MMM and pink   CV: RRR. S1 and S2 normal, no m/r/c/g. no bruit, or JVD. DP Pulses 2  equal. 1+ BLE edema.  Lungs:   chest rise symm, diminished bases. CTA bilat, no wheezes/crackles/rhonchi. Good air movement.  Skin:   w/d/i, no jaundice present, no rashes, lesions, petechiae or breakdown. no drng, erythema. Abd: contour, abdomen soft, non-tender and not distended. Normoactive bowel sounds x 4 quads, no rebound tenderness or guarding.   Ext: No cyanosis, bruising, clubbing or edema.   Neuro: perrl 3Sl, mdl, orbital edema, +c/c/g,     Continuous Infusions:   ??? HYDROmorphone 14 mg/hr (07/17/20 1015)   ??? midazolam (1 mg/mL) infusion 14 mg/hr (07/17/20 1016)   ??? norepinephrine bitartrate-NS 2.027 mcg/min (07/17/20 0800)   ??? NxStage RFP 400 (+/- BB) 5000 mL - contains 2 mEq/L of potassium     ??? NxStage RFP 401 (+/- BB) 5000 mL - contains 4 mEq/L of potassium     ??? vecuronium infusion 100 mg/100 mL (1 mg/mL) 1.811 mcg/kg/min (07/17/20 0800)       Scheduled Medications:   ??? buPROPion  100 mg Enteral tube: gastric  TID   ??? chlorhexidine  5 mL Mouth BID   ??? escitalopram oxalate  20 mg Enteral tube: gastric  Daily   ??? famotidine  20 mg Enteral tube: gastric  Daily   ??? heparin (porcine) for subcutaneous use  7,500 Units Subcutaneous [facility]   ??? insulin NPH  5 Units Subcutaneous Q12H [facility]   ??? insulin regular  0-12 Units  Subcutaneous Q6H [facility]   ??? levETIRAcetam  500 mg Enteral tube: gastric  Daily   ??? levothyroxine  100 mcg Enteral tube: gastric  daily   ??? LORazepam  8 mg Enteral tube: gastric  Q4H   ??? metoclopramide  10 mg Intravenous Q6H [facility]   ??? oxyCODONE  60 mg Enteral tube: gastric  Q4H   ??? polyethylen glycol  17 g Enteral tube: gastric  TID   ??? pravastatin  40 mg Enteral tube: gastric  Daily   ??? QUEtiapine  50 mg Enteral tube: gastric  TID   ??? sennosides  10 mL Enteral tube: gastric  Nightly   ??? Tacrolimus  1 mg Enteral tube: gastric  Daily   ??? Tacrolimus  0.5 mg Oral Nightly (2000)   ??? white petrolatum-mineral oiL  1 application Both Eyes BID       PRN medications:  dextrose 50 % in water (D50W), HYDROmorphone, midazolam, VECuronium    Data/Imaging Review: Reviewed in Epic and personally interpreted on 07/17/2020. See EMR for detailed results.      Critical Care Attestation     This patient is critically ill or injured with the impairment of vital organ systems such that there is a high probability of imminent or life threatening deterioration in the patient's condition. This patient must remain in the ICU for ongoing evaluation of the comprehensive management plan outlined in this note. I directly provided critical care services as documented in this note and the critical care time spent (45 min) is exclusive of separately billable procedures.    Shantella Blubaugh Paulino Door, PA

## 2020-07-17 NOTE — Unmapped (Signed)
MICU Nightshift Note     Date of Service: 07/16/2020    Principal Problem:    Acute hypoxemic respiratory failure due to severe acute respiratory syndrome coronavirus 2 (SARS-CoV-2) disease (CMS-HCC)  Active Problems:    Liver transplant recipient (CMS-HCC)    Morbid obesity with BMI of 40.0-44.9, adult (CMS-HCC)    Acute renal failure (ARF) (CMS-HCC)  Resolved Problems:    * No resolved hospital problems. *          Plan summary     Patient continues to require complete mechanical ventilatory support.    Blood gas at 11:20 PM, 7.27/5 8/58/  on PRVC 30/400/18/ 100%,    Continues to require norepinephrine support, and continues to be sedated with Dilaudid and Versed and paralyzed with vecuronium.      Blood gas at 3 AM: 7.2 6/62/81      Darnelle Bos, MD

## 2020-07-17 NOTE — Unmapped (Signed)
[facility] Nephrology Continuous Renal Replacement Therapy Procedure Note     07/17/2020    Evelyn Rollins was seen and examined on CRRT    CHIEF COMPLAINT: Acute Kidney Disease    INTERVAL HISTORY:. Was -+1100 ml yesterday. Still paralyzed, FiO2 100 and on levophed    CURRENT DIALYSIS PRESCRIPTION:  Device: CRRT Device: NxStage  Therapy fluid: Therapy Fluid : NxStage RFP 401 - Contains 4 mEq/L KCL  Therapy fluid rate: Therapy Fluid Rate (L/hr): 2.7 L/hr  Blood flow rate: Blood Pump Rate (mL/min): 250 mL/min  Fluid removal rate: Hourly Fluid Removal Rate (mL/hr): 200 mL/hr    PHYSICAL EXAM:  Vitals:  Temp:  [36.2 ??C-36.6 ??C] 36.4 ??C  Heart Rate:  [75-82] 76  SpO2 Pulse:  [75-82] 76  MAP:  [56 mmHg-67 mmHg] 61 mmHg  A BP-2: (98-120)/(41-51) 109/45  MAP:  [56 mmHg-67 mmHg] 61 mmHg    In/Outs:    Intake/Output Summary (Last 24 hours) at 07/17/2020 1557  Last data filed at 07/17/2020 1400  Gross per 24 hour   Intake 2376.02 ml   Output 3176 ml   Net -799.98 ml        Weights:  Admission Weight: (!) 104.8 kg (231 lb)  Last documented Weight: (!) 108 kg (238 lb 1.6 oz)  Weight Change from Previous Day: No weight listed for specified days    Assessment:   General: Appearing ill  Pulmonary: prone; intubated; lungs clear  Cardiovascular: distant heart sounds  Extremities: trace edema  Access: Left IJ non-tunneled catheter     LAB DATA:  Lab Results   Component Value Date    NA 136 07/17/2020    K 4.3 07/17/2020    CL 103 07/17/2020    CO2 29.0 07/17/2020    BUN 14 07/17/2020    CREATININE 1.06 (H) 07/17/2020    CALCIUM 8.6 (L) 07/17/2020    MG 2.2 07/17/2020    PHOS 2.7 07/17/2020    ALBUMIN 1.8 (L) 07/17/2020      Lab Results   Component Value Date    HCT 29.7 (L) 07/17/2020    HGB 9.5 (L) 07/17/2020    WBC 13.3 (H) 07/17/2020        ASSESSMENT/PLAN:  Acute Kidney Disease on Continuous Renal Replacement Therapy:  - UF goal: 200 mL/hr as tolerated  - Anticoagulation: N/A  - Renally dose all medications; prograf per pharmacy    Justine Null, MD  [facility] Division of Nephrology & Hypertension

## 2020-07-17 NOTE — Unmapped (Signed)
Pt remains proned and paralyzed and vented, increased vec drip overnight when pt not synchronous with vent, do not turn order remains in place, dilaudid and versed at 14 and levo at 2, daughter updated via phone overnight, will continue to monitor pt      Problem: Adult Inpatient Plan of Care  Goal: Plan of Care Review  Outcome: Not Progressing  Goal: Patient-Specific Goal (Individualization)  Outcome: Not Progressing  Goal: Absence of Hospital-Acquired Illness or Injury  Outcome: Not Progressing  Goal: Optimal Comfort and Wellbeing  Outcome: Not Progressing  Goal: Readiness for Transition of Care  Outcome: Not Progressing  Goal: Rounds/Family Conference  Outcome: Not Progressing     Problem: Skin Injury Risk Increased  Goal: Skin Health and Integrity  Outcome: Not Progressing     Problem: Infection  Goal: Infection Symptom Resolution  Outcome: Not Progressing     Problem: Fall Injury Risk  Goal: Absence of Fall and Fall-Related Injury  Outcome: Not Progressing     Problem: Wound  Goal: Optimal Wound Healing  Outcome: Not Progressing     Problem: Communication Impairment (Mechanical Ventilation, Invasive)  Goal: Effective Communication  Outcome: Not Progressing     Problem: Device-Related Complication Risk (Mechanical Ventilation, Invasive)  Goal: Optimal Device Function  Outcome: Not Progressing     Problem: Inability to Wean (Mechanical Ventilation, Invasive)  Goal: Mechanical Ventilation Liberation  Outcome: Not Progressing     Problem: Skin and Tissue Injury (Mechanical Ventilation, Invasive)  Goal: Absence of Device-Related Skin and Tissue Injury  Outcome: Not Progressing     Problem: Ventilator-Induced Lung Injury (Mechanical Ventilation, Invasive)  Goal: Absence of Ventilator-Induced Lung Injury  Outcome: Not Progressing     Problem: Self-Care Deficit  Goal: Improved Ability to Complete Activities of Daily Living  Outcome: Not Progressing     Problem: Asthma Comorbidity  Goal: Maintenance of Asthma Control Outcome: Not Progressing     Problem: COPD Comorbidity  Goal: Maintenance of COPD Symptom Control  Outcome: Not Progressing     Problem: Diabetes Comorbidity  Goal: Blood Glucose Level Within Desired Range  Outcome: Not Progressing     Problem: Heart Failure Comorbidity  Goal: Maintenance of Heart Failure Symptom Control  Outcome: Not Progressing     Problem: Hypertension Comorbidity  Goal: Blood Pressure in Desired Range  Outcome: Not Progressing     Problem: Obstructive Sleep Apnea Risk or Actual (Comorbidity Management)  Goal: Unobstructed Breathing During Sleep  Outcome: Not Progressing     Problem: Pain Chronic (Persistent) (Comorbidity Management)  Goal: Acceptable Pain Control and Functional Ability  Outcome: Not Progressing     Problem: Seizure Disorder Comorbidity  Goal: Maintenance of Seizure Control  Outcome: Not Progressing     Problem: LTC COVID-19 Confirmed or Rule-Out  Goal: Patient/Resident will remain free of complications due to COVID-19  Description: 1. Review and update the patient/resident's isolation status in the Isolation activity  2. Keep the patient/resident's door closed at all times and limit movement of the patient/resident outside of the room to medically essential purposes. If applicable, transfer patient/resident to a single-person room   3. Educate and reinforce infection prevention and control practices recommended by CDC  4. Frequently monitor for development of more severe symptoms   5. Use appropriate PPE when providing care for patient/resident  6. Reinforce no visitor policy and non-essential health care personnel policy, except for certain compassionate care situations  7. If worsening of symptoms occur, alert the nearest Hospital caring for confirmed COVID-19 patients and  arrange for transfer with proper precautions including placing a facemask on the patient/resident during transfer  8. Communicate information about known or suspected case of COVID-19 to appropriate public health personnel  9. Avoid procedures that are likely to induce coughing (e.g., sputum induction, open suctioning of airways). If required, do so in an Airborne Infection Isolation Room. The health care provider in the room should wear an N95 or higher-level respirator, eye protection, gloves, and a gown. The number of HCP present during the procedure should be limited to only those essential for patient/resident care and procedure support. Visitors should not be present for the procedure. Clean and disinfect procedure room surfaces promptly  10. Update patient/resident and family/representatives as needed      Outcome: Not Progressing     Problem: Gas Exchange Impaired  Goal: Optimal Gas Exchange  Outcome: Not Progressing     Problem: Electrolyte Imbalance (Acute Kidney Injury/Impairment)  Goal: Serum Electrolyte Balance  Outcome: Not Progressing     Problem: Fluid Imbalance (Acute Kidney Injury/Impairment)  Goal: Optimal Fluid Balance  Outcome: Not Progressing     Problem: Hematologic Alteration (Acute Kidney Injury/Impairment)  Goal: Hemoglobin, Hematocrit and Platelets Within Normal Range  Outcome: Not Progressing     Problem: Oral Intake Inadequate (Acute Kidney Injury/Impairment)  Goal: Optimal Nutrition Intake  Outcome: Not Progressing     Problem: Renal Function Impairment (Acute Kidney Injury/Impairment)  Goal: Effective Renal Function  Outcome: Not Progressing

## 2020-07-18 LAB — CBC
HEMATOCRIT: 29.9 % — ABNORMAL LOW (ref 36.0–46.0)
HEMOGLOBIN: 9.5 g/dL — ABNORMAL LOW (ref 12.0–16.0)
HEMOGLOBIN: 9.6 g/dL — ABNORMAL LOW (ref 12.0–16.0)
MEAN CORPUSCULAR HEMOGLOBIN CONC: 31.6 g/dL (ref 31.0–37.0)
MEAN CORPUSCULAR HEMOGLOBIN CONC: 31.3 g/dL (ref 31.0–37.0)
MEAN CORPUSCULAR HEMOGLOBIN: 30.2 pg (ref 26.0–34.0)
MEAN CORPUSCULAR HEMOGLOBIN: 30.2 pg (ref 26.0–34.0)
MEAN CORPUSCULAR VOLUME: 95.6 fL (ref 80.0–100.0)
MEAN CORPUSCULAR VOLUME: 96.5 fL (ref 80.0–100.0)
MEAN PLATELET VOLUME: 9.5 fL (ref 7.0–10.0)
MEAN PLATELET VOLUME: 9.4 fL (ref 7.0–10.0)
NUCLEATED RED BLOOD CELLS: 4 /100{WBCs} (ref <=4)
NUCLEATED RED BLOOD CELLS: 3 /100{WBCs} (ref <=4)
PLATELET COUNT: 80 10*9/L — ABNORMAL LOW (ref 150–440)
RED BLOOD CELL COUNT: 3.13 10*12/L — ABNORMAL LOW (ref 4.00–5.20)
RED BLOOD CELL COUNT: 3.19 10*12/L — ABNORMAL LOW (ref 4.00–5.20)
RED CELL DISTRIBUTION WIDTH: 16.3 % — ABNORMAL HIGH (ref 12.0–15.0)
WBC ADJUSTED: 13.2 10*9/L — ABNORMAL HIGH (ref 4.5–11.0)
WBC ADJUSTED: 17.2 10*9/L — ABNORMAL HIGH (ref 4.5–11.0)

## 2020-07-18 LAB — COMPREHENSIVE METABOLIC PANEL
ALBUMIN: 1.7 g/dL — ABNORMAL LOW (ref 3.4–5.0)
ALBUMIN: 1.7 g/dL — ABNORMAL LOW (ref 3.4–5.0)
ALKALINE PHOSPHATASE: 73 U/L (ref 46–116)
ALKALINE PHOSPHATASE: 82 U/L (ref 46–116)
ALT (SGPT): 14 U/L (ref 10–49)
ALT (SGPT): 16 U/L (ref 10–49)
ANION GAP: 5 mmol/L (ref 5–14)
ANION GAP: 4 mmol/L — ABNORMAL LOW (ref 5–14)
ANION GAP: 4 mmol/L — ABNORMAL LOW (ref 5–14)
AST (SGOT): 30 U/L (ref <=34)
AST (SGOT): 28 U/L (ref <=34)
AST (SGOT): 34 U/L (ref <=34)
BILIRUBIN TOTAL: 0.2 mg/dL — ABNORMAL LOW (ref 0.3–1.2)
BILIRUBIN TOTAL: 0.2 mg/dL — ABNORMAL LOW (ref 0.3–1.2)
BILIRUBIN TOTAL: 0.2 mg/dL — ABNORMAL LOW (ref 0.3–1.2)
BLOOD UREA NITROGEN: 14 mg/dL (ref 9–23)
BLOOD UREA NITROGEN: 16 mg/dL (ref 9–23)
BLOOD UREA NITROGEN: 16 mg/dL (ref 9–23)
BUN / CREAT RATIO: 13
BUN / CREAT RATIO: 14
BUN / CREAT RATIO: 14
CALCIUM: 8.3 mg/dL — ABNORMAL LOW (ref 8.7–10.4)
CALCIUM: 8.6 mg/dL — ABNORMAL LOW (ref 8.7–10.4)
CHLORIDE: 104 mmol/L (ref 98–107)
CHLORIDE: 104 mmol/L (ref 98–107)
CO2: 26 mmol/L (ref 20.0–31.0)
CO2: 27 mmol/L (ref 20.0–31.0)
CREATININE: 1.1 mg/dL — ABNORMAL HIGH
CREATININE: 1.13 mg/dL — ABNORMAL HIGH
CREATININE: 1.16 mg/dL — ABNORMAL HIGH
EGFR CKD-EPI AA FEMALE: 66 mL/min/{1.73_m2} (ref >=60)
EGFR CKD-EPI AA FEMALE: 64 mL/min/{1.73_m2} (ref >=60)
EGFR CKD-EPI AA FEMALE: 62 mL/min/{1.73_m2} (ref >=60)
EGFR CKD-EPI NON-AA FEMALE: 55 mL/min/{1.73_m2} — ABNORMAL LOW (ref >=60)
EGFR CKD-EPI NON-AA FEMALE: 54 mL/min/{1.73_m2} — ABNORMAL LOW (ref >=60)
GLUCOSE RANDOM: 102 mg/dL (ref 70–179)
GLUCOSE RANDOM: 97 mg/dL (ref 70–179)
GLUCOSE RANDOM: 114 mg/dL (ref 70–179)
POTASSIUM: 4.7 mmol/L — ABNORMAL HIGH (ref 3.4–4.5)
POTASSIUM: 4.5 mmol/L (ref 3.4–4.5)
POTASSIUM: 5 mmol/L — ABNORMAL HIGH (ref 3.4–4.5)
PROTEIN TOTAL: 5.5 g/dL — ABNORMAL LOW (ref 5.7–8.2)
PROTEIN TOTAL: 5.7 g/dL (ref 5.7–8.2)
PROTEIN TOTAL: 5.9 g/dL (ref 5.7–8.2)
SODIUM: 135 mmol/L (ref 135–145)
SODIUM: 134 mmol/L — ABNORMAL LOW (ref 135–145)
SODIUM: 135 mmol/L (ref 135–145)

## 2020-07-18 LAB — CBC W/ AUTO DIFF
BASOPHILS ABSOLUTE COUNT: 0.1 10*9/L (ref 0.0–0.1)
EOSINOPHILS ABSOLUTE COUNT: 0.3 10*9/L (ref 0.0–0.4)
EOSINOPHILS RELATIVE PERCENT: 1.8 %
HEMATOCRIT: 30.3 % — ABNORMAL LOW (ref 36.0–46.0)
HEMOGLOBIN: 9.7 g/dL — ABNORMAL LOW (ref 12.0–16.0)
LARGE UNSTAINED CELLS: 1 % (ref 0–4)
LYMPHOCYTES RELATIVE PERCENT: 10.1 %
MEAN CORPUSCULAR HEMOGLOBIN CONC: 31.9 g/dL (ref 31.0–37.0)
MEAN CORPUSCULAR HEMOGLOBIN: 30.4 pg (ref 26.0–34.0)
MEAN CORPUSCULAR VOLUME: 95.2 fL (ref 80.0–100.0)
MEAN PLATELET VOLUME: 9.1 fL (ref 7.0–10.0)
MONOCYTES ABSOLUTE COUNT: 0.5 10*9/L (ref 0.2–0.8)
MONOCYTES RELATIVE PERCENT: 3.9 %
NEUTROPHILS ABSOLUTE COUNT: 11.4 10*9/L — ABNORMAL HIGH (ref 2.0–7.5)
NEUTROPHILS RELATIVE PERCENT: 82 %
PLATELET COUNT: 92 10*9/L — ABNORMAL LOW (ref 150–440)
RED BLOOD CELL COUNT: 3.18 10*12/L — ABNORMAL LOW (ref 4.00–5.20)
RED CELL DISTRIBUTION WIDTH: 16.3 % — ABNORMAL HIGH (ref 12.0–15.0)
WBC ADJUSTED: 13.9 10*9/L — ABNORMAL HIGH (ref 4.5–11.0)

## 2020-07-18 LAB — BLOOD GAS CRITICAL CARE PANEL, ARTERIAL
BASE EXCESS ARTERIAL: -0.3 (ref -2.0–2.0)
BASE EXCESS ARTERIAL: -0.5 (ref -2.0–2.0)
BASE EXCESS ARTERIAL: 0.2 (ref -2.0–2.0)
BASE EXCESS ARTERIAL: 0.3 (ref -2.0–2.0)
BASE EXCESS ARTERIAL: 0.9 (ref -2.0–2.0)
CALCIUM IONIZED ARTERIAL (MG/DL): 5.16 mg/dL (ref 4.40–5.40)
CALCIUM IONIZED ARTERIAL (MG/DL): 5.31 mg/dL (ref 4.40–5.40)
CALCIUM IONIZED ARTERIAL (MG/DL): 5.16 mg/dL (ref 4.40–5.40)
GLUCOSE WHOLE BLOOD: 100 mg/dL (ref 70–179)
GLUCOSE WHOLE BLOOD: 103 mg/dL (ref 70–179)
GLUCOSE WHOLE BLOOD: 105 mg/dL (ref 70–179)
GLUCOSE WHOLE BLOOD: 109 mg/dL (ref 70–179)
GLUCOSE WHOLE BLOOD: 118 mg/dL (ref 70–179)
GLUCOSE WHOLE BLOOD: 97 mg/dL (ref 70–179)
HCO3 ARTERIAL: 26 mmol/L (ref 22–27)
HCO3 ARTERIAL: 27 mmol/L (ref 22–27)
HCO3 ARTERIAL: 27 mmol/L (ref 22–27)
HCO3 ARTERIAL: 27 mmol/L (ref 22–27)
HCO3 ARTERIAL: 29 mmol/L — ABNORMAL HIGH (ref 22–27)
HEMOGLOBIN BLOOD GAS: 9.5 g/dL — ABNORMAL LOW (ref 12.00–16.00)
HEMOGLOBIN BLOOD GAS: 6.6 g/dL — ABNORMAL LOW (ref 12.00–16.00)
HEMOGLOBIN BLOOD GAS: 9.2 g/dL — ABNORMAL LOW (ref 12.00–16.00)
HEMOGLOBIN BLOOD GAS: 9.6 g/dL — ABNORMAL LOW (ref 12.00–16.00)
LACTATE BLOOD ARTERIAL: 1.3 mmol/L — ABNORMAL HIGH (ref <1.3)
LACTATE BLOOD ARTERIAL: 1.3 mmol/L — ABNORMAL HIGH (ref <1.3)
LACTATE BLOOD ARTERIAL: 1.3 mmol/L — ABNORMAL HIGH (ref <1.3)
LACTATE BLOOD ARTERIAL: 1.4 mmol/L — ABNORMAL HIGH (ref <1.3)
LACTATE BLOOD ARTERIAL: 1.5 mmol/L — ABNORMAL HIGH (ref <1.3)
LACTATE BLOOD ARTERIAL: 1.5 mmol/L — ABNORMAL HIGH (ref <1.3)
O2 SATURATION ARTERIAL: 90.5 % — ABNORMAL LOW (ref 94.0–100.0)
O2 SATURATION ARTERIAL: 90.8 % — ABNORMAL LOW (ref 94.0–100.0)
O2 SATURATION ARTERIAL: 92.6 % — ABNORMAL LOW (ref 94.0–100.0)
O2 SATURATION ARTERIAL: 86.2 % — ABNORMAL LOW (ref 94.0–100.0)
O2 SATURATION ARTERIAL: 83.8 % — ABNORMAL LOW (ref 94.0–100.0)
PCO2 ARTERIAL: 61.3 mmHg — ABNORMAL HIGH (ref 35.0–45.0)
PCO2 ARTERIAL: 62 mmHg — ABNORMAL HIGH (ref 35.0–45.0)
PCO2 ARTERIAL: 62.9 mmHg — ABNORMAL HIGH (ref 35.0–45.0)
PCO2 ARTERIAL: 64 mmHg — ABNORMAL HIGH (ref 35.0–45.0)
PCO2 ARTERIAL: 67.2 mmHg (ref 35.0–45.0)
PH ARTERIAL: 7.22 — ABNORMAL LOW (ref 7.35–7.45)
PH ARTERIAL: 7.24 — ABNORMAL LOW (ref 7.35–7.45)
PH ARTERIAL: 7.27 — ABNORMAL LOW (ref 7.35–7.45)
PO2 ARTERIAL: 55 mmHg — ABNORMAL LOW (ref 80.0–110.0)
PO2 ARTERIAL: 58.6 mmHg — ABNORMAL LOW (ref 80.0–110.0)
PO2 ARTERIAL: 61.7 mmHg — ABNORMAL LOW (ref 80.0–110.0)
PO2 ARTERIAL: 61.9 mmHg — ABNORMAL LOW (ref 80.0–110.0)
PO2 ARTERIAL: 62.3 mmHg — ABNORMAL LOW (ref 80.0–110.0)
POTASSIUM WHOLE BLOOD: 4.3 mmol/L (ref 3.4–4.6)
POTASSIUM WHOLE BLOOD: 4.7 mmol/L — ABNORMAL HIGH (ref 3.4–4.6)
POTASSIUM WHOLE BLOOD: 4.7 mmol/L — ABNORMAL HIGH (ref 3.4–4.6)
POTASSIUM WHOLE BLOOD: 4.8 mmol/L — ABNORMAL HIGH (ref 3.4–4.6)
POTASSIUM WHOLE BLOOD: 4.8 mmol/L — ABNORMAL HIGH (ref 3.4–4.6)
POTASSIUM WHOLE BLOOD: 5 mmol/L — ABNORMAL HIGH (ref 3.4–4.6)
SODIUM WHOLE BLOOD: 137 mmol/L (ref 135–145)
SODIUM WHOLE BLOOD: 137 mmol/L (ref 135–145)
SODIUM WHOLE BLOOD: 137 mmol/L (ref 135–145)
SODIUM WHOLE BLOOD: 137 mmol/L (ref 135–145)
SODIUM WHOLE BLOOD: 137 mmol/L (ref 135–145)

## 2020-07-18 LAB — PHOSPHORUS
PHOSPHORUS: 2.6 mg/dL (ref 2.4–5.1)
Phosphate:MCnc:Pt:Ser/Plas:Qn:: 2.6
Phosphate:MCnc:Pt:Ser/Plas:Qn:: 2.6
Phosphate:MCnc:Pt:Ser/Plas:Qn:: 2.8

## 2020-07-18 LAB — HEPARIN CORRELATION: Lab: 0.2

## 2020-07-18 LAB — APTT
APTT: 76.7 s — ABNORMAL HIGH (ref 24.9–36.9)
Coagulation surface induced:Time:Pt:PPP:Qn:Coag: 76.7 — ABNORMAL HIGH
Coagulation surface induced:Time:Pt:PPP:Qn:Coag: 88.7 — ABNORMAL HIGH

## 2020-07-18 LAB — HEMOGLOBIN BLOOD GAS: Hemoglobin:MCnc:Pt:Bld:Qn:: 9.2 — ABNORMAL LOW

## 2020-07-18 LAB — FIO2 ARTERIAL

## 2020-07-18 LAB — POTASSIUM WHOLE BLOOD: Potassium:SCnc:Pt:Bld:Qn:: 4.8 — ABNORMAL HIGH

## 2020-07-18 LAB — MAGNESIUM
Magnesium:MCnc:Pt:Ser/Plas:Qn:: 1.7
Magnesium:MCnc:Pt:Ser/Plas:Qn:: 1.8
Magnesium:MCnc:Pt:Ser/Plas:Qn:: 1.8

## 2020-07-18 LAB — RED CELL DISTRIBUTION WIDTH: Lab: 16.3 — ABNORMAL HIGH

## 2020-07-18 LAB — ALKALINE PHOSPHATASE: Alkaline phosphatase:CCnc:Pt:Ser/Plas:Qn:: 82

## 2020-07-18 LAB — EOSINOPHILS RELATIVE PERCENT: Eosinophils/100 leukocytes:NFr:Pt:Bld:Qn:Automated count: 1.8

## 2020-07-18 LAB — BLOOD UREA NITROGEN: Urea nitrogen:MCnc:Pt:Ser/Plas:Qn:: 16

## 2020-07-18 LAB — D-DIMER QUANTITATIVE (CW,ML,HL): Lab: 18050 — ABNORMAL HIGH

## 2020-07-18 LAB — CALCIUM IONIZED ARTERIAL (MG/DL): Calcium.ionized:MCnc:Pt:Bld:Qn:: 5.31

## 2020-07-18 LAB — SLIDE SCAN

## 2020-07-18 LAB — CO2: Carbon dioxide:SCnc:Pt:Ser/Plas:Qn:: 26

## 2020-07-18 LAB — GLUCOSE WHOLE BLOOD: Glucose:MCnc:Pt:Bld:Qn:: 118

## 2020-07-18 MED ADMIN — heparin 25,000 Units/250 mL (100 units/mL) in 0.45% saline infusion (premade): 12 [IU]/kg/h | INTRAVENOUS | @ 13:00:00

## 2020-07-18 MED ADMIN — heparin (porcine) 1000 unit/mL injection 4,000 Units: 4000 [IU] | INTRAVENOUS | @ 13:00:00 | Stop: 2020-07-18

## 2020-07-18 MED ADMIN — metoclopramide (REGLAN) injection 10 mg: 10 mg | INTRAVENOUS | @ 04:00:00

## 2020-07-18 MED ADMIN — sennosides (SENOKOT) oral syrup: 10 mL | GASTROENTERAL

## 2020-07-18 MED ADMIN — LORazepam (ATIVAN) tablet 8 mg: 8 mg | GASTROENTERAL | @ 04:00:00

## 2020-07-18 MED ADMIN — chlorhexidine (PERIDEX) 0.12 % solution 5 mL: 5 mL | OROMUCOSAL | @ 13:00:00

## 2020-07-18 MED ADMIN — norepinephrine 8 mg in sodium chloride 0.9 % 250 mL (32mcg/mL) infusion PMB: 0-30 ug/min | INTRAVENOUS | @ 18:00:00

## 2020-07-18 MED ADMIN — oxyCODONE (ROXICODONE) immediate release tablet 60 mg: 60 mg | GASTROENTERAL | @ 13:00:00 | Stop: 2020-07-30

## 2020-07-18 MED ADMIN — oxyCODONE (ROXICODONE) immediate release tablet 60 mg: 60 mg | GASTROENTERAL | @ 08:00:00 | Stop: 2020-07-30

## 2020-07-18 MED ADMIN — LORazepam (ATIVAN) tablet 8 mg: 8 mg | GASTROENTERAL | @ 08:00:00

## 2020-07-18 MED ADMIN — buPROPion (WELLBUTRIN) tablet 100 mg: 100 mg | GASTROENTERAL | @ 01:00:00

## 2020-07-18 MED ADMIN — HYDROmorphone 1 mg/mL in 0.9% sodium chloride: 0-14 mg/h | INTRAVENOUS | Stop: 2020-07-29

## 2020-07-18 MED ADMIN — polyethylene glycol (MIRALAX) packet 17 g: 17 g | GASTROENTERAL

## 2020-07-18 MED ADMIN — VECuronium 100 mg in 100 mL (1 mg/mL) infusion: 0-3 ug/kg/min | INTRAVENOUS | @ 17:00:00

## 2020-07-18 MED ADMIN — QUEtiapine (SEROquel) tablet 50 mg: 50 mg | GASTROENTERAL

## 2020-07-18 MED ADMIN — NxStage RFP 401 (+/- BB) 5000 mL - contains 4 mEq/L of potassium dialysis solution 5,000 mL: 5000 mL | INTRAVENOUS_CENTRAL | @ 11:00:00

## 2020-07-18 MED ADMIN — NxStage RFP 401 (+/- BB) 5000 mL - contains 4 mEq/L of potassium dialysis solution 5,000 mL: 5000 mL | INTRAVENOUS_CENTRAL | @ 16:00:00

## 2020-07-18 MED ADMIN — NxStage RFP 401 (+/- BB) 5000 mL - contains 4 mEq/L of potassium dialysis solution 5,000 mL: 5000 mL | INTRAVENOUS_CENTRAL | @ 06:00:00

## 2020-07-18 MED ADMIN — midazolam in sodium chloride 0.9% (1 mg/mL) infusion PMB: 0-14 mg/h | INTRAVENOUS | @ 20:00:00

## 2020-07-18 MED ADMIN — HYDROmorphone 1 mg/mL in 0.9% sodium chloride: 0-14 mg/h | INTRAVENOUS | @ 08:00:00 | Stop: 2020-07-29

## 2020-07-18 MED ADMIN — oxyCODONE (ROXICODONE) immediate release tablet 60 mg: 60 mg | GASTROENTERAL | @ 20:00:00 | Stop: 2020-07-30

## 2020-07-18 MED ADMIN — chlorhexidine (PERIDEX) 0.12 % solution 5 mL: 5 mL | OROMUCOSAL | @ 23:00:00

## 2020-07-18 MED ADMIN — VECuronium (NORCURON) injection 10 mg: 10 mg | INTRAVENOUS | @ 06:00:00

## 2020-07-18 MED ADMIN — levothyroxine (SYNTHROID) tablet 100 mcg: 100 ug | GASTROENTERAL | @ 09:00:00

## 2020-07-18 MED ADMIN — HYDROmorphone 1 mg/mL in 0.9% sodium chloride: 0-14 mg/h | INTRAVENOUS | @ 17:00:00 | Stop: 2020-07-29

## 2020-07-18 MED ADMIN — buPROPion (WELLBUTRIN) tablet 100 mg: 100 mg | GASTROENTERAL | @ 19:00:00

## 2020-07-18 MED ADMIN — LORazepam (ATIVAN) tablet 8 mg: 8 mg | GASTROENTERAL | @ 16:00:00

## 2020-07-18 MED ADMIN — HYDROmorphone 1 mg/mL in 0.9% sodium chloride: 0-14 mg/h | INTRAVENOUS | @ 14:00:00 | Stop: 2020-07-29

## 2020-07-18 MED ADMIN — LORazepam (ATIVAN) tablet 8 mg: 8 mg | GASTROENTERAL | @ 20:00:00

## 2020-07-18 MED ADMIN — escitalopram oxalate (LEXAPRO) tablet 20 mg: 20 mg | GASTROENTERAL | @ 13:00:00

## 2020-07-18 MED ADMIN — HYDROmorphone 1 mg/mL in 0.9% sodium chloride: 0-14 mg/h | INTRAVENOUS | @ 04:00:00 | Stop: 2020-07-29

## 2020-07-18 MED ADMIN — NxStage RFP 401 (+/- BB) 5000 mL - contains 4 mEq/L of potassium dialysis solution 5,000 mL: 5000 mL | INTRAVENOUS_CENTRAL | @ 01:00:00

## 2020-07-18 MED ADMIN — NxStage RFP 401 (+/- BB) 5000 mL - contains 4 mEq/L of potassium dialysis solution 5,000 mL: 5000 mL | INTRAVENOUS_CENTRAL | @ 07:00:00

## 2020-07-18 MED ADMIN — VECuronium (NORCURON) injection 10 mg: 10 mg | INTRAVENOUS | @ 18:00:00

## 2020-07-18 MED ADMIN — LORazepam (ATIVAN) tablet 8 mg: 8 mg | GASTROENTERAL

## 2020-07-18 MED ADMIN — chlorhexidine (PERIDEX) 0.12 % solution 5 mL: 5 mL | OROMUCOSAL

## 2020-07-18 MED ADMIN — LORazepam (ATIVAN) tablet 8 mg: 8 mg | GASTROENTERAL | @ 13:00:00

## 2020-07-18 MED ADMIN — NxStage RFP 401 (+/- BB) 5000 mL - contains 4 mEq/L of potassium dialysis solution 5,000 mL: 5000 mL | INTRAVENOUS_CENTRAL | @ 21:00:00

## 2020-07-18 MED ADMIN — QUEtiapine (SEROquel) tablet 50 mg: 50 mg | GASTROENTERAL | @ 19:00:00

## 2020-07-18 MED ADMIN — tacrolimus (PROGRAF) oral suspension: 1 mg | GASTROENTERAL | @ 13:00:00

## 2020-07-18 MED ADMIN — HYDROmorphone 1 mg/mL in 0.9% sodium chloride: 0-14 mg/h | INTRAVENOUS | @ 10:00:00 | Stop: 2020-07-29

## 2020-07-18 MED ADMIN — midazolam in sodium chloride 0.9% (1 mg/mL) infusion PMB: 0-14 mg/h | INTRAVENOUS | @ 13:00:00

## 2020-07-18 MED ADMIN — metoclopramide (REGLAN) injection 10 mg: 10 mg | INTRAVENOUS | @ 09:00:00

## 2020-07-18 MED ADMIN — oxyCODONE (ROXICODONE) immediate release tablet 60 mg: 60 mg | GASTROENTERAL | @ 16:00:00 | Stop: 2020-07-30

## 2020-07-18 MED ADMIN — buPROPion (WELLBUTRIN) tablet 100 mg: 100 mg | GASTROENTERAL | @ 13:00:00

## 2020-07-18 MED ADMIN — HYDROmorphone 1 mg/mL in 0.9% sodium chloride: 0-14 mg/h | INTRAVENOUS | @ 01:00:00 | Stop: 2020-07-29

## 2020-07-18 MED ADMIN — VECuronium (NORCURON) injection 10 mg: 10 mg | INTRAVENOUS | @ 04:00:00

## 2020-07-18 MED ADMIN — white petrolatum-mineral oiL (SOOTHE PM) 80-20 % ophthalmic ointment 1 application: 1 | OPHTHALMIC

## 2020-07-18 MED ADMIN — metoclopramide (REGLAN) injection 10 mg: 10 mg | INTRAVENOUS | @ 16:00:00

## 2020-07-18 MED ADMIN — oxyCODONE (ROXICODONE) immediate release tablet 60 mg: 60 mg | GASTROENTERAL | Stop: 2020-07-30

## 2020-07-18 MED ADMIN — metoclopramide (REGLAN) injection 10 mg: 10 mg | INTRAVENOUS | @ 22:00:00

## 2020-07-18 MED ADMIN — QUEtiapine (SEROquel) tablet 50 mg: 50 mg | GASTROENTERAL | @ 13:00:00

## 2020-07-18 MED ADMIN — midazolam in sodium chloride 0.9% (1 mg/mL) infusion PMB: 0-14 mg/h | INTRAVENOUS | @ 05:00:00

## 2020-07-18 MED ADMIN — oxyCODONE (ROXICODONE) immediate release tablet 60 mg: 60 mg | GASTROENTERAL | @ 04:00:00 | Stop: 2020-07-30

## 2020-07-18 MED ADMIN — polyethylene glycol (MIRALAX) packet 17 g: 17 g | GASTROENTERAL | @ 13:00:00

## 2020-07-18 MED ADMIN — levETIRAcetam (KEPPRA) tablet 500 mg: 500 mg | GASTROENTERAL | @ 13:00:00

## 2020-07-18 MED ADMIN — heparin (porcine) 7,500 units/0.75 mL syringe: 7500 [IU] | SUBCUTANEOUS | @ 01:00:00

## 2020-07-18 NOTE — Unmapped (Signed)
Pt remains sedated and paralyzed and vented, CRRT running with 200 UF, remains on 100 % on vent with do not turn order, daughter updated on phone overnight, will continue to monitor pt.      Problem: Adult Inpatient Plan of Care  Goal: Plan of Care Review  Outcome: Not Progressing  Goal: Patient-Specific Goal (Individualization)  Outcome: Not Progressing  Goal: Absence of Hospital-Acquired Illness or Injury  Outcome: Not Progressing  Goal: Optimal Comfort and Wellbeing  Outcome: Not Progressing  Goal: Readiness for Transition of Care  Outcome: Not Progressing  Goal: Rounds/Family Conference  Outcome: Not Progressing     Problem: Skin Injury Risk Increased  Goal: Skin Health and Integrity  Outcome: Not Progressing     Problem: Infection  Goal: Infection Symptom Resolution  Outcome: Not Progressing     Problem: Fall Injury Risk  Goal: Absence of Fall and Fall-Related Injury  Outcome: Not Progressing     Problem: Wound  Goal: Optimal Wound Healing  Outcome: Not Progressing     Problem: Communication Impairment (Mechanical Ventilation, Invasive)  Goal: Effective Communication  Outcome: Not Progressing     Problem: Device-Related Complication Risk (Mechanical Ventilation, Invasive)  Goal: Optimal Device Function  Outcome: Not Progressing     Problem: Inability to Wean (Mechanical Ventilation, Invasive)  Goal: Mechanical Ventilation Liberation  Outcome: Not Progressing     Problem: Skin and Tissue Injury (Mechanical Ventilation, Invasive)  Goal: Absence of Device-Related Skin and Tissue Injury  Outcome: Not Progressing     Problem: Ventilator-Induced Lung Injury (Mechanical Ventilation, Invasive)  Goal: Absence of Ventilator-Induced Lung Injury  Outcome: Not Progressing     Problem: Self-Care Deficit  Goal: Improved Ability to Complete Activities of Daily Living  Outcome: Not Progressing     Problem: Asthma Comorbidity  Goal: Maintenance of Asthma Control  Outcome: Not Progressing     Problem: COPD Comorbidity  Goal: Maintenance of COPD Symptom Control  Outcome: Not Progressing     Problem: Diabetes Comorbidity  Goal: Blood Glucose Level Within Desired Range  Outcome: Not Progressing     Problem: Heart Failure Comorbidity  Goal: Maintenance of Heart Failure Symptom Control  Outcome: Not Progressing     Problem: Hypertension Comorbidity  Goal: Blood Pressure in Desired Range  Outcome: Not Progressing     Problem: Obstructive Sleep Apnea Risk or Actual (Comorbidity Management)  Goal: Unobstructed Breathing During Sleep  Outcome: Not Progressing     Problem: Pain Chronic (Persistent) (Comorbidity Management)  Goal: Acceptable Pain Control and Functional Ability  Outcome: Not Progressing     Problem: Seizure Disorder Comorbidity  Goal: Maintenance of Seizure Control  Outcome: Not Progressing     Problem: LTC COVID-19 Confirmed or Rule-Out  Goal: Patient/Resident will remain free of complications due to COVID-19  Description: 1. Review and update the patient/resident's isolation status in the Isolation activity  2. Keep the patient/resident's door closed at all times and limit movement of the patient/resident outside of the room to medically essential purposes. If applicable, transfer patient/resident to a single-person room   3. Educate and reinforce infection prevention and control practices recommended by CDC  4. Frequently monitor for development of more severe symptoms   5. Use appropriate PPE when providing care for patient/resident  6. Reinforce no visitor policy and non-essential health care personnel policy, except for certain compassionate care situations  7. If worsening of symptoms occur, alert the nearest Hospital caring for confirmed COVID-19 patients and arrange for transfer with proper precautions including placing a  facemask on the patient/resident during transfer  8. Communicate information about known or suspected case of COVID-19 to appropriate public health personnel  9. Avoid procedures that are likely to induce coughing (e.g., sputum induction, open suctioning of airways). If required, do so in an Airborne Infection Isolation Room. The health care provider in the room should wear an N95 or higher-level respirator, eye protection, gloves, and a gown. The number of HCP present during the procedure should be limited to only those essential for patient/resident care and procedure support. Visitors should not be present for the procedure. Clean and disinfect procedure room surfaces promptly  10. Update patient/resident and family/representatives as needed      Outcome: Not Progressing     Problem: Gas Exchange Impaired  Goal: Optimal Gas Exchange  Outcome: Not Progressing     Problem: Electrolyte Imbalance (Acute Kidney Injury/Impairment)  Goal: Serum Electrolyte Balance  Outcome: Not Progressing     Problem: Fluid Imbalance (Acute Kidney Injury/Impairment)  Goal: Optimal Fluid Balance  Outcome: Not Progressing     Problem: Hematologic Alteration (Acute Kidney Injury/Impairment)  Goal: Hemoglobin, Hematocrit and Platelets Within Normal Range  Outcome: Not Progressing     Problem: Oral Intake Inadequate (Acute Kidney Injury/Impairment)  Goal: Optimal Nutrition Intake  Outcome: Not Progressing         Problem: Renal Function Impairment (Acute Kidney Injury/Impairment)  Goal: Effective Renal Function  Outcome: Not Progressing     Problem: Communication Impairment (Artificial Airway)  Goal: Effective Communication  Outcome: Not Progressing     Problem: Device-Related Complication Risk (Artificial Airway)  Goal: Optimal Device Function  Outcome: Not Progressing     Problem: Skin and Tissue Injury (Artificial Airway)  Goal: Absence of Device-Related Skin or Tissue Injury  Outcome: Not Progressing

## 2020-07-18 NOTE — Unmapped (Signed)
MICU Daily Progress Note     Date of Service: 07/18/2020    Problem List:   Principal Problem:    Acute hypoxemic respiratory failure due to severe acute respiratory syndrome coronavirus 2 (SARS-CoV-2) disease (CMS-HCC)  Active Problems:    Liver transplant recipient (CMS-HCC)    Morbid obesity with BMI of 40.0-44.9, adult (CMS-HCC)    Acute renal failure (ARF) (CMS-HCC)  Resolved Problems:    * No resolved hospital problems. *      Interval history:  Evelyn Rollins is a 54 y.o. female with PMH cirrhosis status post liver transplant 2016 on chronic immunosuppressive therapy, hypertension, diabetes, hypothyroidism who presented to [facility] on 7/22 with myalgias, sore throat, loss of taste and fevers  x5 days. When her symptoms had started, she took a home Covid test on 7/17 which was found to be positive. She has had poor p.o. intake for the last several days. Her husband also has similar symptoms. In the OSH ED she was found to be severely hypoxic with O2 saturations of 50% on room air. She was placed on a nonrebreather and current saturations increased to the low 90s. Chest x-ray shows bilateral infiltrates consistent with COVID-19 pneumonia. Her Covid test is positive. Inflammatory markers are elevated. She received a dose of Decadron in the emergency room.    ON events:   -remained prone/paralayzed     Neurological   Analgesia and Sedation  -continue dilaudid and versed gtt  -cis gtt (7/31 - 8/5) vec gtt 8/5-->  -PRN dilaudid and versed  -ativan 8q4h, oxy 60q4h  - seroquel 50 mg tid  ??  Anxiety, depression  -restarted home meds Wellbutrin/Lexapro, and Seroquel in place of abilify  ??  Seizure history (not taking AEDs), ?seizure activity  -neurology consult >> c/f RUE weakness, MRI 7/27- no acute pathology  -vEEG negative for seizure x 48hrs >> discont monitoring per neuro 7/25  - MRI with possible sz focus,  keppra 500 mg daily per neuro recommendations       Pulmonary   ARDS, Acute resp failure r/t COVID, pnuemomediastinum .  Intubated 7/22   -worsening oxygenation and airway pressures higher  - prone since 430pm on 8/2  - remains paralyzed and sedated.  desats to 60's with movement and taking longer to recover.    - family aware of tenuous status and has visited.     Cardiovascular   h/o HTN   - holding home antihtn  - last echo 2016 w nml LVEF, enlrgd RV and deprssed RV fxn w mild phtn  -continue statin     Hypotension  -continue levophed for SBP >100    Renal   ARF on CRRT:  - cont foley for accurate I/O during critical illness   - replete electrolytes prn and monitor daily  - nephrology following  - CRRT goal UF 150-200/hr  - goal = to neg     Infectious Disease/Autoimmune   COVID 19 infection  Symptom onset:??7/17  Exposure: unknown  COVID PCR+: 7/17  OSH Admission:??7/22  Day of admission/transfer: 7/22  OSH Meds Given:??dexamethasone  COVID Specific??Meds:??dexamethasone 7/22, remdesivir 7/22  Vaccination status: not vaccinated  ??  Monitoring:  - Special airborne/contact precautions (If unavailable, droplet & contact precautions)  - f/u daily CMP, CBC w/ diff, CRP, D-dimer, DIC, troponin, pro-BNP with weekly ferritin, LDH and cytokine level  - f/u q6h lactate; ABG  ??  Medications:  - completed remdesivir  - cont decadron 6mg  po  daily x 10d, started  7/22  ??  CAP/VAP: MSSA   - febrile, leukocytosis   - started cefep/vanc 7/25--> changed to ancef on 7/27 x7 days  - pan cx for T>38.2   - wbc count rising, hypoxia worsening, broad spectrum abx   (vanc 8/1-8/4)  Cefepime completed 8/6    Cultures:  Blood Culture, Routine (no units)   Date Value   07/12/2020 No Growth at 5 days   07/12/2020 No Growth at 5 days     Lower Respiratory Culture (no units)   Date Value   07/12/2020 OROPHARYNGEAL FLORA ISOLATED   07/04/2020 3+ Methicillin-Susceptible Staphylococcus aureus (A)     WBC (10*9/L)   Date Value   07/18/2020 13.9 (H)     WBC, UA (/HPF)   Date Value   07/12/2020 7 (H)          FEN/GI   -trickle tube feeds > high residuals, continue reglan  -bowel regimen w senna and miralax, FMS with stool  -PPI  ??  Immunocompromised s/p Liver transplant in 2016  -restarted tacro 1mg /0.5mg   - f/u tacro level  -HOLD cellcept    Heme/Coag   Hypercoagulable state 2/2 covid  -anticoagulation group C heparin  -LE dopplers neg for DVT 7/24  -no evid bleeding  - anemia: h/h 6.8/21.9: tx 2 units PRBC on 8/5  - ddimer increased to group B.  Changed to low intensity heparin gtt     Endocrine   History of DM2  -ISS  -NPH 5 bid    Hypothyroidism  -continue synthroid  ??  Integumentary   - WOCN consulted for high risk skin assessment Yes.  - WOCN recs >>Mepilex Xt cut to fit above new ET trial holder, proning protocol in place, interdry can be used to folds  - cont pressure mitigating precautions per skin policy    Prophylaxis/LDA/Restraints/Consults   Can CVC be removed? No: need for medications requiring central access (e.g. pressors)   Can A-line be removed? No: frequent ABGs  Can Foley be removed? No: Need continuous I/O  Mobility plan: Step 1 - Range of motion    Feeding: Trickle feeds, advance as tolerated  Analgesia: No pain issues  Sedation SAT/SBT: No PEEP > 8  Thromboembolic ppx: Enoxaparin  Head of bed >30 degrees: Yes  Ulcer ppx: Yes, coagulopathy  Glucose within target range: Yes, in range    Does patient need/have an active type/screen? NA    RASS at goal? Yes  [place] Agitation Assessment Scale (RASS) : -5 (07/18/2020 10:00 AM)     Can antipsychotics be stopped? N/A, not on antipsychotics  CAM-ICU Result: Positive (07/18/2020  8:00 AM)      Would hospice care be appropriate for this patient- yes but too unstable     Patient Lines/Drains/Airways Status    Active Active Lines, Drains, & Airways     Name:   Placement date:   Placement time:   Site:   Days:    ETT  7.5   07/08/2020    1700     15    CVC Triple Lumen 06/21/2020 Non-tunneled Right Internal jugular   07/08/2020    1945    Internal jugular   15    Hemodialysis Catheter With Distal Infusion Port 07/09/20 Left Internal jugular 1.4 mL 1.4 mL   07/09/20    1700    Internal jugular   8    NG/OG Tube Decompression;Feedings Center mouth   07/03/20    0610    Center mouth  15    Urethral Catheter   07/09/2020    ???    ???   16    Arterial Line 06/14/2020 Right Radial   07/05/2020    2315    Radial   15              Patient Lines/Drains/Airways Status    Active Wounds     Name:   Placement date:   Placement time:   Site:   Days:    Wound 07/11/20 Pressure Injury Face Left cheek under prev ET tube holder- pt was proned DTI   07/11/20    1100    Face   7    Wound 07/13/20 Pressure Injury Nose from ET holder. pt was proned  DTI   07/13/20    1254    Nose   4    Wound 07/13/20 Pressure Injury Other (Comment) Right cheek r/t proning DTI   07/13/20    1255    Other (Comment)   4                Goals of Care     Code Status: Full Code    Designated Healthcare Decision Maker:  Ms. Transue current decisional capacity for healthcare decision-making is incapacitated. Her designated Educational psychologist) is/are her husband Vernia Buff .      Subjective     Intubated, sedated, prone    Objective     Vitals - past 24 hours  Temp:  [35.9 ??C (96.6 ??F)-36.8 ??C (98.2 ??F)] 35.9 ??C (96.6 ??F)  Heart Rate:  [76-89] 85  SpO2 Pulse:  [76-89] 85  Resp:  [30] 30  FiO2 (%):  [100 %] 100 %  SpO2:  [86 %-94 %] 88 % Intake/Output  I/O last 3 completed shifts:  In: 3241.6 [P.O.:5; I.V.:1266.6; NG/GT:1920; IV Piggyback:50]  Out: 6079 [Urine:5; Emesis/NG output:700; ZOXWR:6045; Stool:100]     ??  Physical Exam:   General: NAD, well nourished, obese, intubated, sedated, prone  HEENT:  normocephalic, trachea mdl, anicteric, no scleral edema, no conjuctival erythema/drainage, MMM and pink   CV: RRR. S1 and S2 normal, no m/r/c/g. no bruit, or JVD. DP Pulses 2  equal. 1+ BLE edema.  Lungs:   chest rise symm, diminished bases. CTA bilat, no wheezes/crackles/rhonchi. Good air movement.  Skin:   w/d/i, no jaundice present, no rashes, lesions, petechiae or breakdown. no drng, erythema.    Abd: contour, abdomen soft, non-tender and not distended. Normoactive bowel sounds x 4 quads, no rebound tenderness or guarding.   Ext: No cyanosis, bruising, clubbing or edema.   Neuro: perrl 3Sl, mdl, orbital edema, +c/c/g,     Continuous Infusions:   ??? heparin 12 Units/kg/hr (07/18/20 0830)   ??? HYDROmorphone 14 mg/hr (07/18/20 0937)   ??? midazolam (1 mg/mL) infusion 14 mg/hr (07/18/20 0910)   ??? norepinephrine bitartrate-NS 2 mcg/min (07/18/20 0400)   ??? NxStage RFP 400 (+/- BB) 5000 mL - contains 2 mEq/L of potassium     ??? NxStage RFP 401 (+/- BB) 5000 mL - contains 4 mEq/L of potassium     ??? vecuronium infusion 100 mg/100 mL (1 mg/mL) 2 mcg/kg/min (07/18/20 0400)       Scheduled Medications:   ??? buPROPion  100 mg Enteral tube: gastric  TID   ??? chlorhexidine  5 mL Mouth BID   ??? escitalopram oxalate  20 mg Enteral tube: gastric  Daily   ??? famotidine  20 mg Enteral tube: gastric  Daily   ???  insulin NPH  5 Units Subcutaneous Q12H [facility]   ??? insulin regular  0-12 Units Subcutaneous Q6H [facility]   ??? levETIRAcetam  500 mg Enteral tube: gastric  Daily   ??? levothyroxine  100 mcg Enteral tube: gastric  daily   ??? LORazepam  8 mg Enteral tube: gastric  Q4H   ??? metoclopramide  10 mg Intravenous Q6H [facility]   ??? oxyCODONE  60 mg Enteral tube: gastric  Q4H   ??? polyethylen glycol  17 g Enteral tube: gastric  TID   ??? pravastatin  40 mg Enteral tube: gastric  Daily   ??? QUEtiapine  50 mg Enteral tube: gastric  TID   ??? sennosides  10 mL Enteral tube: gastric  Nightly   ??? Tacrolimus  1 mg Enteral tube: gastric  Daily   ??? Tacrolimus  0.5 mg Oral Nightly (2000)   ??? white petrolatum-mineral oiL  1 application Both Eyes BID       PRN medications:  dextrose 50 % in water (D50W), heparin (porcine), HYDROmorphone, midazolam, VECuronium    Data/Imaging Review: Reviewed in Epic and personally interpreted on 07/18/2020. See EMR for detailed results.      Critical Care Attestation     This patient is critically ill or injured with the impairment of vital organ systems such that there is a high probability of imminent or life threatening deterioration in the patient's condition. This patient must remain in the ICU for ongoing evaluation of the comprehensive management plan outlined in this note. I directly provided critical care services as documented in this note and the critical care time spent (45 min) is exclusive of separately billable procedures.    Gabriel Paulding Paulino Door, PA

## 2020-07-18 NOTE — Unmapped (Signed)
MICU Nightshift Note     Date of Service: 07/17/2020    Principal Problem:    Acute hypoxemic respiratory failure due to severe acute respiratory syndrome coronavirus 2 (SARS-CoV-2) disease (CMS-HCC)  Active Problems:    Liver transplant recipient (CMS-HCC)    Morbid obesity with BMI of 40.0-44.9, adult (CMS-HCC)    Acute renal failure (ARF) (CMS-HCC)  Resolved Problems:    * No resolved hospital problems. *          Plan summary     Patient continues to be on full mechanical ventilatory support, prone and paralyzed.  Blood gas at 7:25 PM: 7.2 7/62/60 2.4  Blood gas at 4 am 7.26/64/62  No other events    Darnelle Bos, MD

## 2020-07-18 NOTE — Unmapped (Signed)
[facility] Nephrology Continuous Renal Replacement Therapy Procedure Note     07/18/2020    Evelyn Rollins was seen and examined on CRRT    CHIEF COMPLAINT: Acute Kidney Disease    INTERVAL HISTORY:. No acute overnight changes.     CURRENT DIALYSIS PRESCRIPTION:  Device: CRRT Device: NxStage  Therapy fluid: Therapy Fluid : NxStage RFP 401 - Contains 4 mEq/L KCL  Therapy fluid rate: Therapy Fluid Rate (L/hr): 2.7 L/hr  Blood flow rate: Blood Pump Rate (mL/min): 300 mL/min  Fluid removal rate: Hourly Fluid Removal Rate (mL/hr): 200 mL/hr    PHYSICAL EXAM:  Vitals:  Temp:  [35.9 ??C (96.6 ??F)-36.8 ??C (98.2 ??F)] 35.9 ??C (96.6 ??F)  Heart Rate:  [76-89] 86  SpO2 Pulse:  [76-89] 86  MAP:  [57 mmHg-67 mmHg] 57 mmHg  A BP-2: (106-128)/(42-49) 106/42  MAP:  [57 mmHg-67 mmHg] 57 mmHg    In/Outs:    Intake/Output Summary (Last 24 hours) at 07/18/2020 1034  Last data filed at 07/18/2020 1000  Gross per 24 hour   Intake 2200.22 ml   Output 4458 ml   Net -2257.78 ml        Weights:  Admission Weight: (!) 104.8 kg (231 lb)  Last documented Weight: (!) 108 kg (238 lb 1.6 oz)  Weight Change from Previous Day: No weight listed for specified days    Assessment:   General: Appearing ill  Pulmonary: prone; intubated; lungs clear  Cardiovascular: distant heart sounds  Extremities: trace edema  Access: Left IJ non-tunneled catheter     LAB DATA:  Lab Results   Component Value Date    NA 137 07/18/2020    K 4.7 (H) 07/18/2020    CL 104 07/18/2020    CO2 26.0 07/18/2020    BUN 14 07/18/2020    CREATININE 1.10 (H) 07/18/2020    CALCIUM 8.3 (L) 07/18/2020    MG 1.8 07/18/2020    PHOS 2.6 07/18/2020    ALBUMIN 1.7 (L) 07/18/2020      Lab Results   Component Value Date    HCT 30.3 (L) 07/18/2020    HGB 9.7 (L) 07/18/2020    WBC 13.9 (H) 07/18/2020        ASSESSMENT/PLAN:  Acute Kidney Disease on Continuous Renal Replacement Therapy:  - UF goal: 200 mL/hr as tolerated  - Anticoagulation: N/A  - Renally dose all medications; prograf per pharmacy    Archie Balboa, MD  [facility] Division of Nephrology & Hypertension

## 2020-07-18 NOTE — Unmapped (Signed)
Patient continues to tolerate current vent settings noted on the vent flow sheet. No changes were made. Unable to wean FiO2 100%. Patient remains prone. Too unstable to swim & reposition head. No break down or pressure sores noted from ETT. Will continue to monitor.    Problem: Communication Impairment (Mechanical Ventilation, Invasive)  Goal: Effective Communication  Outcome: Ongoing - Unchanged     Problem: Device-Related Complication Risk (Mechanical Ventilation, Invasive)  Goal: Optimal Device Function  Outcome: Ongoing - Unchanged     Problem: Inability to Wean (Mechanical Ventilation, Invasive)  Goal: Mechanical Ventilation Liberation  Outcome: Ongoing - Unchanged     Problem: Skin and Tissue Injury (Mechanical Ventilation, Invasive)  Goal: Absence of Device-Related Skin and Tissue Injury  Outcome: Ongoing - Unchanged     Problem: Ventilator-Induced Lung Injury (Mechanical Ventilation, Invasive)  Goal: Absence of Ventilator-Induced Lung Injury  Outcome: Ongoing - Unchanged     Problem: Communication Impairment (Artificial Airway)  Goal: Effective Communication  Outcome: Ongoing - Unchanged     Problem: Device-Related Complication Risk (Artificial Airway)  Goal: Optimal Device Function  Outcome: Ongoing - Unchanged     Problem: Skin and Tissue Injury (Artificial Airway)  Goal: Absence of Device-Related Skin or Tissue Injury  Outcome: Ongoing - Unchanged

## 2020-07-18 NOTE — Unmapped (Signed)
Continuous Renal Replacement  Dialysis Nurse Therapy Procedure Note    Treatment Type:  [facility] Number Of Days On Therapy:  0 Procedure Date:  07/18/2020 2:33 AM     TREATMENT STATUS:  Restarted  Patient and Treatment Status     None          Active Dialysis Orders (168h ago, onward)     Start     Ordered    07/17/20 1615  CRRT Orders - NxStage (Adult)  Continuous     Comments: Fluid Removal Rate parameters:  SBP   <141mmHg 10 mL/hr;  SBP   >100 mmHg 50-200 mL/hr;   Question Answer Comment   CRRT System: NxStage    Modality: CVVH    Access: Left Internal Jugular    BFR (mL/min): 200-350    Dialysate Flow Rate (mL/kg/hr): Other (Specify) 2.7L       07/17/20 1615              SYSTEM CHECK:  Machine Name: [id number]  Dialyzer: [id number]   Self Test Completed: Yes.        Alarms Connected To The Wall And Active:  Yes.    VITAL SIGNS:  Temp:  [36.2 ??C (97.2 ??F)-36.8 ??C (98.2 ??F)] 36.6 ??C (97.9 ??F)  Heart Rate:  [76-89] 89  SpO2 Pulse:  [76-89] 89  Resp:  [30] 30  SpO2:  [86 %-94 %] 87 %  A BP-2: (109-128)/(44-49) 119/49  MAP:  [61 mmHg-66 mmHg] 66 mmHg    ACCESS SITE:        Hemodialysis Catheter With Distal Infusion Port 07/09/20 Left Internal jugular 1.4 mL 1.4 mL (Active)   Site Assessment Clean;Dry;Intact 07/18/20 0000   Proximal Lumen Status / Patency Blood Return - Brisk;Infusing 07/16/20 0400   Proximal Lumen Intervention Accessed 07/18/20 0000   Medial Lumen Status / Patency Blood Return - Brisk 07/16/20 0400   Medial Lumen Intervention Accessed 07/18/20 0000   Lumen 3, Distal Status / Patency Blood Return - Brisk 07/18/20 0000   Distal Lumen Intervention Flushed 07/18/20 0000   Exposed Catheter Length (cm) 0 cm 07/09/20 1700   IV Tubing and Needleless Injector Cap Change Due 07/14/20 07/14/20 1600   Dressing Type CHG gel;Occlusive 07/18/20 0000   Dressing Status      Clean;Dry;Intact/not removed 07/17/20 2000   Dressing Intervention No intervention needed 07/17/20 2000   Contraindicated due to: Dressing Intact surrounding insertion site 07/17/20 2000   Dressing Change Due 2020/08/15 07/17/20 2000   Line Necessity Reviewed? Y 07/17/20 2000   Line Necessity Indications Yes - Hemodialysis 07/17/20 2000   Line Necessity Reviewed With mdi 07/17/20 2000          CATHETER FILL VOLUMES:     Arterial: 1.4 mL  Venous: 1.4 mL     Lab Results   Component Value Date    NA 137 07/17/2020    K 4.3 07/17/2020    CL 104 07/17/2020    CO2 27.0 07/17/2020    BUN 15 07/17/2020     Lab Results   Component Value Date    CALCIUM 8.6 (L) 07/17/2020    CAION 4.87 07/17/2020    PHOS 2.6 07/17/2020    MG 1.9 07/17/2020        SETTINGS:  Blood Pump Rate: 300 mL/min  Replacement Fluid Rate:     Pre-Blood Pump Fluid Rate:    Hourly Fluid Removal Rate: 10 mL/hr   Dialysate Fluid Rate    Therapy Fluid Temperature:  ANTICOAGULANT:  None    ADDITIONAL COMMENTS:  None    HEMODIALYSIS ON-CALL NURSE PAGER NUMBER:  ?? Monday thru Saturday 0700 - 1730: Call the Dialysis Unit ext. [phone]   ?? After 1730 and all day Sunday: Call the Dialysis RN Pager Number [phone]     PROCEDURE REVIEW, VERIFICATION, HANDOFF:  CRRT settings verified, procedure reviewed, and instructions given to primary RN.     Primary RN Verifying CRRT: Greer Pickerel Dialysis RN Verifying: Sherlyn Lick

## 2020-07-19 LAB — COMPREHENSIVE METABOLIC PANEL
ALBUMIN: 1.7 g/dL — ABNORMAL LOW (ref 3.4–5.0)
ALBUMIN: 1.7 g/dL — ABNORMAL LOW (ref 3.4–5.0)
ALBUMIN: 1.8 g/dL — ABNORMAL LOW (ref 3.4–5.0)
ALKALINE PHOSPHATASE: 81 U/L (ref 46–116)
ALKALINE PHOSPHATASE: 79 U/L (ref 46–116)
ALKALINE PHOSPHATASE: 85 U/L (ref 46–116)
ALT (SGPT): 13 U/L (ref 10–49)
ALT (SGPT): 14 U/L (ref 10–49)
ALT (SGPT): 14 U/L (ref 10–49)
ANION GAP: 3 mmol/L — ABNORMAL LOW (ref 5–14)
ANION GAP: 3 mmol/L — ABNORMAL LOW (ref 5–14)
AST (SGOT): 27 U/L (ref <=34)
AST (SGOT): 27 U/L (ref <=34)
BILIRUBIN TOTAL: 0.2 mg/dL — ABNORMAL LOW (ref 0.3–1.2)
BILIRUBIN TOTAL: 0.3 mg/dL (ref 0.3–1.2)
BILIRUBIN TOTAL: 0.3 mg/dL (ref 0.3–1.2)
BLOOD UREA NITROGEN: 17 mg/dL (ref 9–23)
BLOOD UREA NITROGEN: 18 mg/dL (ref 9–23)
BUN / CREAT RATIO: 15
CALCIUM: 8.7 mg/dL (ref 8.7–10.4)
CALCIUM: 8.7 mg/dL (ref 8.7–10.4)
CHLORIDE: 105 mmol/L (ref 98–107)
CHLORIDE: 103 mmol/L (ref 98–107)
CHLORIDE: 106 mmol/L (ref 98–107)
CO2: 28 mmol/L (ref 20.0–31.0)
CO2: 28 mmol/L (ref 20.0–31.0)
CO2: 26 mmol/L (ref 20.0–31.0)
CREATININE: 1.18 mg/dL — ABNORMAL HIGH
CREATININE: 1.2 mg/dL — ABNORMAL HIGH
CREATININE: 1.13 mg/dL — ABNORMAL HIGH
EGFR CKD-EPI AA FEMALE: 60 mL/min/{1.73_m2} (ref >=60)
EGFR CKD-EPI AA FEMALE: 59 mL/min/{1.73_m2} — ABNORMAL LOW (ref >=60)
EGFR CKD-EPI AA FEMALE: 64 mL/min/{1.73_m2} (ref >=60)
EGFR CKD-EPI NON-AA FEMALE: 52 mL/min/{1.73_m2} — ABNORMAL LOW (ref >=60)
EGFR CKD-EPI NON-AA FEMALE: 55 mL/min/{1.73_m2} — ABNORMAL LOW (ref >=60)
GLUCOSE RANDOM: 102 mg/dL (ref 70–179)
GLUCOSE RANDOM: 105 mg/dL (ref 70–179)
GLUCOSE RANDOM: 123 mg/dL (ref 70–179)
POTASSIUM: 4.9 mmol/L — ABNORMAL HIGH (ref 3.4–4.5)
POTASSIUM: 4.9 mmol/L — ABNORMAL HIGH (ref 3.4–4.5)
PROTEIN TOTAL: 5.9 g/dL (ref 5.7–8.2)
PROTEIN TOTAL: 5.9 g/dL (ref 5.7–8.2)
PROTEIN TOTAL: 6.4 g/dL (ref 5.7–8.2)
SODIUM: 136 mmol/L (ref 135–145)
SODIUM: 134 mmol/L — ABNORMAL LOW (ref 135–145)
SODIUM: 136 mmol/L (ref 135–145)

## 2020-07-19 LAB — CBC
HEMATOCRIT: 29.1 % — ABNORMAL LOW (ref 36.0–46.0)
HEMATOCRIT: 30.4 % — ABNORMAL LOW (ref 36.0–46.0)
HEMOGLOBIN: 9.2 g/dL — ABNORMAL LOW (ref 12.0–16.0)
HEMOGLOBIN: 9.4 g/dL — ABNORMAL LOW (ref 12.0–16.0)
MEAN CORPUSCULAR HEMOGLOBIN CONC: 31.7 g/dL (ref 31.0–37.0)
MEAN CORPUSCULAR HEMOGLOBIN CONC: 30.8 g/dL — ABNORMAL LOW (ref 31.0–37.0)
MEAN CORPUSCULAR HEMOGLOBIN: 29.8 pg (ref 26.0–34.0)
MEAN CORPUSCULAR VOLUME: 96.4 fL (ref 80.0–100.0)
MEAN PLATELET VOLUME: 9.1 fL (ref 7.0–10.0)
MEAN PLATELET VOLUME: 11.1 fL — ABNORMAL HIGH (ref 7.0–10.0)
PLATELET COUNT: 66 10*9/L — ABNORMAL LOW (ref 150–440)
PLATELET COUNT: 43 10*9/L — ABNORMAL LOW (ref 150–440)
RED BLOOD CELL COUNT: 3.13 10*12/L — ABNORMAL LOW (ref 4.00–5.20)
WBC ADJUSTED: 15.4 10*9/L — ABNORMAL HIGH (ref 4.5–11.0)
WBC ADJUSTED: 15.9 10*9/L — ABNORMAL HIGH (ref 4.5–11.0)

## 2020-07-19 LAB — BLOOD GAS CRITICAL CARE PANEL, ARTERIAL
BASE EXCESS ARTERIAL: -1.3 (ref -2.0–2.0)
BASE EXCESS ARTERIAL: -1.8 (ref -2.0–2.0)
BASE EXCESS ARTERIAL: -2.2 — ABNORMAL LOW (ref -2.0–2.0)
BASE EXCESS ARTERIAL: 0.7 (ref -2.0–2.0)
CALCIUM IONIZED ARTERIAL (MG/DL): 4.94 mg/dL (ref 4.40–5.40)
CALCIUM IONIZED ARTERIAL (MG/DL): 5.04 mg/dL (ref 4.40–5.40)
CALCIUM IONIZED ARTERIAL (MG/DL): 5.06 mg/dL (ref 4.40–5.40)
CALCIUM IONIZED ARTERIAL (MG/DL): 5.17 mg/dL (ref 4.40–5.40)
CALCIUM IONIZED ARTERIAL (MG/DL): 5.18 mg/dL (ref 4.40–5.40)
CALCIUM IONIZED ARTERIAL (MG/DL): 5.27 mg/dL (ref 4.40–5.40)
GLUCOSE WHOLE BLOOD: 105 mg/dL (ref 70–179)
GLUCOSE WHOLE BLOOD: 109 mg/dL (ref 70–179)
GLUCOSE WHOLE BLOOD: 116 mg/dL (ref 70–179)
GLUCOSE WHOLE BLOOD: 125 mg/dL (ref 70–179)
GLUCOSE WHOLE BLOOD: 130 mg/dL (ref 70–179)
GLUCOSE WHOLE BLOOD: 93 mg/dL (ref 70–179)
GLUCOSE WHOLE BLOOD: 98 mg/dL (ref 70–179)
HCO3 ARTERIAL: 25 mmol/L (ref 22–27)
HCO3 ARTERIAL: 25 mmol/L (ref 22–27)
HCO3 ARTERIAL: 25 mmol/L (ref 22–27)
HCO3 ARTERIAL: 27 mmol/L (ref 22–27)
HCO3 ARTERIAL: 28 mmol/L — ABNORMAL HIGH (ref 22–27)
HEMOGLOBIN BLOOD GAS: 7.6 g/dL — ABNORMAL LOW (ref 12.00–16.00)
HEMOGLOBIN BLOOD GAS: 8.4 g/dL — ABNORMAL LOW (ref 12.00–16.00)
HEMOGLOBIN BLOOD GAS: 8.8 g/dL — ABNORMAL LOW (ref 12.00–16.00)
HEMOGLOBIN BLOOD GAS: 9 g/dL — ABNORMAL LOW (ref 12.00–16.00)
HEMOGLOBIN BLOOD GAS: 9.1 g/dL — ABNORMAL LOW (ref 12.00–16.00)
HEMOGLOBIN BLOOD GAS: 9.2 g/dL — ABNORMAL LOW (ref 12.00–16.00)
HEMOGLOBIN BLOOD GAS: 9.2 g/dL — ABNORMAL LOW (ref 12.00–16.00)
LACTATE BLOOD ARTERIAL: 1.2 mmol/L (ref <1.3)
LACTATE BLOOD ARTERIAL: 1.4 mmol/L — ABNORMAL HIGH (ref <1.3)
LACTATE BLOOD ARTERIAL: 1.4 mmol/L — ABNORMAL HIGH (ref <1.3)
LACTATE BLOOD ARTERIAL: 1.5 mmol/L — ABNORMAL HIGH (ref <1.3)
LACTATE BLOOD ARTERIAL: 1.5 mmol/L — ABNORMAL HIGH (ref <1.3)
O2 SATURATION ARTERIAL: 81.9 % — ABNORMAL LOW (ref 94.0–100.0)
O2 SATURATION ARTERIAL: 83.3 % — ABNORMAL LOW (ref 94.0–100.0)
O2 SATURATION ARTERIAL: 84.8 % — ABNORMAL LOW (ref 94.0–100.0)
O2 SATURATION ARTERIAL: 87 % — ABNORMAL LOW (ref 94.0–100.0)
O2 SATURATION ARTERIAL: 88.7 % — ABNORMAL LOW (ref 94.0–100.0)
O2 SATURATION ARTERIAL: 90.4 % — ABNORMAL LOW (ref 94.0–100.0)
PCO2 ARTERIAL: 57.6 mmHg — ABNORMAL HIGH (ref 35.0–45.0)
PCO2 ARTERIAL: 60.4 mmHg — ABNORMAL HIGH (ref 35.0–45.0)
PCO2 ARTERIAL: 64.2 mmHg — ABNORMAL HIGH (ref 35.0–45.0)
PCO2 ARTERIAL: 64.3 mmHg — ABNORMAL HIGH (ref 35.0–45.0)
PCO2 ARTERIAL: 67.4 mmHg (ref 35.0–45.0)
PCO2 ARTERIAL: 71.2 mmHg (ref 35.0–45.0)
PCO2 ARTERIAL: 72.3 mmHg (ref 35.0–45.0)
PH ARTERIAL: 7.17 — CL (ref 7.35–7.45)
PH ARTERIAL: 7.19 — CL (ref 7.35–7.45)
PH ARTERIAL: 7.2 — ABNORMAL LOW (ref 7.35–7.45)
PH ARTERIAL: 7.22 — ABNORMAL LOW (ref 7.35–7.45)
PH ARTERIAL: 7.24 — ABNORMAL LOW (ref 7.35–7.45)
PH ARTERIAL: 7.25 — ABNORMAL LOW (ref 7.35–7.45)
PH ARTERIAL: 7.29 — ABNORMAL LOW (ref 7.35–7.45)
PO2 ARTERIAL: 47.3 mmHg — ABNORMAL LOW (ref 80.0–110.0)
PO2 ARTERIAL: 51.1 mmHg — ABNORMAL LOW (ref 80.0–110.0)
PO2 ARTERIAL: 51.9 mmHg — ABNORMAL LOW (ref 80.0–110.0)
PO2 ARTERIAL: 53.5 mmHg — ABNORMAL LOW (ref 80.0–110.0)
PO2 ARTERIAL: 56.6 mmHg — ABNORMAL LOW (ref 80.0–110.0)
PO2 ARTERIAL: 63.1 mmHg — ABNORMAL LOW (ref 80.0–110.0)
POTASSIUM WHOLE BLOOD: 4.6 mmol/L (ref 3.4–4.6)
POTASSIUM WHOLE BLOOD: 4.8 mmol/L — ABNORMAL HIGH (ref 3.4–4.6)
POTASSIUM WHOLE BLOOD: 4.8 mmol/L — ABNORMAL HIGH (ref 3.4–4.6)
POTASSIUM WHOLE BLOOD: 5.6 mmol/L — ABNORMAL HIGH (ref 3.4–4.6)
SODIUM WHOLE BLOOD: 135 mmol/L (ref 135–145)
SODIUM WHOLE BLOOD: 136 mmol/L (ref 135–145)
SODIUM WHOLE BLOOD: 136 mmol/L (ref 135–145)
SODIUM WHOLE BLOOD: 137 mmol/L (ref 135–145)
SODIUM WHOLE BLOOD: 137 mmol/L (ref 135–145)
SODIUM WHOLE BLOOD: 138 mmol/L (ref 135–145)
SODIUM WHOLE BLOOD: 139 mmol/L (ref 135–145)

## 2020-07-19 LAB — PCO2 ARTERIAL: Carbon dioxide:PPres:Pt:BldA:Qn:: 57.6 — ABNORMAL HIGH

## 2020-07-19 LAB — CBC W/ AUTO DIFF
BASOPHILS ABSOLUTE COUNT: 0.1 10*9/L (ref 0.0–0.1)
BASOPHILS RELATIVE PERCENT: 0.8 %
EOSINOPHILS ABSOLUTE COUNT: 0.4 10*9/L (ref 0.0–0.4)
EOSINOPHILS RELATIVE PERCENT: 2.4 %
HEMATOCRIT: 29.1 % — ABNORMAL LOW (ref 36.0–46.0)
HEMOGLOBIN: 9.3 g/dL — ABNORMAL LOW (ref 12.0–16.0)
LARGE UNSTAINED CELLS: 2 % (ref 0–4)
LYMPHOCYTES ABSOLUTE COUNT: 2.1 10*9/L (ref 1.5–5.0)
MEAN CORPUSCULAR HEMOGLOBIN CONC: 31.8 g/dL (ref 31.0–37.0)
MEAN CORPUSCULAR HEMOGLOBIN: 30.4 pg (ref 26.0–34.0)
MEAN CORPUSCULAR VOLUME: 95.6 fL (ref 80.0–100.0)
MEAN PLATELET VOLUME: 9.1 fL (ref 7.0–10.0)
MONOCYTES ABSOLUTE COUNT: 0.7 10*9/L (ref 0.2–0.8)
MONOCYTES RELATIVE PERCENT: 4.6 %
NEUTROPHILS RELATIVE PERCENT: 76.1 %
PLATELET COUNT: 90 10*9/L — ABNORMAL LOW (ref 150–440)
RED BLOOD CELL COUNT: 3.05 10*12/L — ABNORMAL LOW (ref 4.00–5.20)
RED CELL DISTRIBUTION WIDTH: 16.9 % — ABNORMAL HIGH (ref 12.0–15.0)
WBC ADJUSTED: 14.7 10*9/L — ABNORMAL HIGH (ref 4.5–11.0)

## 2020-07-19 LAB — MAGNESIUM
MAGNESIUM: 1.9 mg/dL (ref 1.6–2.6)
Magnesium:MCnc:Pt:Ser/Plas:Qn:: 1.8
Magnesium:MCnc:Pt:Ser/Plas:Qn:: 1.8
Magnesium:MCnc:Pt:Ser/Plas:Qn:: 1.9

## 2020-07-19 LAB — APTT
APTT: 61.5 s — ABNORMAL HIGH (ref 24.9–36.9)
Coagulation surface induced:Time:Pt:PPP:Qn:Coag: 49.8 — ABNORMAL HIGH
Coagulation surface induced:Time:Pt:PPP:Qn:Coag: 61.5 — ABNORMAL HIGH
Coagulation surface induced:Time:Pt:PPP:Qn:Coag: 61.9 — ABNORMAL HIGH
Coagulation surface induced:Time:Pt:PPP:Qn:Coag: 63.8 — ABNORMAL HIGH
HEPARIN CORRELATION: 0.3

## 2020-07-19 LAB — PHOSPHORUS
Phosphate:MCnc:Pt:Ser/Plas:Qn:: 2.4
Phosphate:MCnc:Pt:Ser/Plas:Qn:: 2.7
Phosphate:MCnc:Pt:Ser/Plas:Qn:: 3.3

## 2020-07-19 LAB — GLUCOSE WHOLE BLOOD: Glucose:MCnc:Pt:Bld:Qn:: 130

## 2020-07-19 LAB — AST (SGOT): Aspartate aminotransferase:CCnc:Pt:Ser/Plas:Qn:: 28

## 2020-07-19 LAB — D-DIMER, QUANTITATIVE: D-DIMER QUANTITATIVE (CW,ML,HL): 4015 ng{FEU}/mL — ABNORMAL HIGH (ref <=500)

## 2020-07-19 LAB — POTASSIUM
POTASSIUM: 5.2 mmol/L — ABNORMAL HIGH (ref 3.4–4.5)
Potassium:SCnc:Pt:Ser/Plas:Qn:: 5.2 — ABNORMAL HIGH

## 2020-07-19 LAB — HCO3 ARTERIAL: Bicarbonate:SCnc:Pt:BldA:Qn:: 25

## 2020-07-19 LAB — MEAN CORPUSCULAR HEMOGLOBIN
Erythrocyte mean corpuscular hemoglobin:EntMass:Pt:RBC:Qn:Automated count: 29.8
Erythrocyte mean corpuscular hemoglobin:EntMass:Pt:RBC:Qn:Automated count: 30.6

## 2020-07-19 LAB — CALCIUM IONIZED ARTERIAL (MG/DL)
Calcium.ionized:MCnc:Pt:Bld:Qn:: 5.18
Calcium.ionized:MCnc:Pt:Bld:Qn:: 5.27

## 2020-07-19 LAB — ANISOCYTOSIS

## 2020-07-19 LAB — D-DIMER QUANTITATIVE (CW,ML,HL): Lab: 4015 — ABNORMAL HIGH

## 2020-07-19 LAB — EGFR CKD-EPI AA FEMALE
Glomerular filtration rate/1.73 sq M.predicted.black:ArVRat:Pt:Ser/Plas/Bld:Qn:Creatinine-based formula (CKD-EPI): 59 — ABNORMAL LOW

## 2020-07-19 LAB — ANION GAP
Anion gap 3:SCnc:Pt:Ser/Plas:Qn:: 3 — ABNORMAL LOW
Anion gap 3:SCnc:Pt:Ser/Plas:Qn:: 4 — ABNORMAL LOW

## 2020-07-19 LAB — SODIUM WHOLE BLOOD: Sodium:SCnc:Pt:Bld:Qn:: 137

## 2020-07-19 LAB — SPECIMEN SOURCE

## 2020-07-19 MED ADMIN — HYDROmorphone 1 mg/mL in 0.9% sodium chloride: 0-14 mg/h | INTRAVENOUS | @ 20:00:00 | Stop: 2020-07-29

## 2020-07-19 MED ADMIN — vasopressin infusion 40 units/50 mL (0.8 units/mL) in NS: .03 [IU]/min | INTRAVENOUS | @ 17:00:00

## 2020-07-19 MED ADMIN — oxyCODONE (ROXICODONE) immediate release tablet 60 mg: 60 mg | GASTROENTERAL | @ 19:00:00 | Stop: 2020-07-30

## 2020-07-19 MED ADMIN — buPROPion (WELLBUTRIN) tablet 100 mg: 100 mg | GASTROENTERAL | @ 17:00:00

## 2020-07-19 MED ADMIN — LORazepam (ATIVAN) tablet 8 mg: 8 mg | GASTROENTERAL | @ 19:00:00

## 2020-07-19 MED ADMIN — polyethylene glycol (MIRALAX) packet 17 g: 17 g | GASTROENTERAL

## 2020-07-19 MED ADMIN — tacrolimus (PROGRAF) oral suspension: 1 mg | GASTROENTERAL | @ 12:00:00

## 2020-07-19 MED ADMIN — oxyCODONE (ROXICODONE) immediate release tablet 60 mg: 60 mg | GASTROENTERAL | @ 15:00:00 | Stop: 2020-07-30

## 2020-07-19 MED ADMIN — cefepime (MAXIPIME) 2 g in dextrose 100 mL IVPB (premix): 2 g | INTRAVENOUS | @ 13:00:00 | Stop: 2020-07-23

## 2020-07-19 MED ADMIN — LORazepam (ATIVAN) tablet 8 mg: 8 mg | GASTROENTERAL | @ 08:00:00

## 2020-07-19 MED ADMIN — metoclopramide (REGLAN) injection 10 mg: 10 mg | INTRAVENOUS | @ 04:00:00

## 2020-07-19 MED ADMIN — LORazepam (ATIVAN) tablet 8 mg: 8 mg | GASTROENTERAL | @ 15:00:00

## 2020-07-19 MED ADMIN — midazolam (VERSED) injection 5 mg: 5 mg | INTRAVENOUS | @ 17:00:00

## 2020-07-19 MED ADMIN — polyethylene glycol (MIRALAX) packet 17 g: 17 g | GASTROENTERAL | @ 12:00:00

## 2020-07-19 MED ADMIN — oxyCODONE (ROXICODONE) immediate release tablet 60 mg: 60 mg | GASTROENTERAL | @ 08:00:00 | Stop: 2020-07-30

## 2020-07-19 MED ADMIN — white petrolatum-mineral oiL (SOOTHE PM) 80-20 % ophthalmic ointment 1 application: 1 | OPHTHALMIC

## 2020-07-19 MED ADMIN — sennosides (SENOKOT) oral syrup: 10 mL | GASTROENTERAL

## 2020-07-19 MED ADMIN — QUEtiapine (SEROquel) tablet 50 mg: 50 mg | GASTROENTERAL | @ 17:00:00

## 2020-07-19 MED ADMIN — oxyCODONE (ROXICODONE) immediate release tablet 60 mg: 60 mg | GASTROENTERAL | Stop: 2020-07-30

## 2020-07-19 MED ADMIN — HYDROmorphone (PF) (DILAUDID) injection 4 mg: 4 mg | INTRAVENOUS | @ 17:00:00

## 2020-07-19 MED ADMIN — HYDROmorphone 1 mg/mL in 0.9% sodium chloride: 0-14 mg/h | INTRAVENOUS | @ 10:00:00 | Stop: 2020-07-29

## 2020-07-19 MED ADMIN — LORazepam (ATIVAN) tablet 8 mg: 8 mg | GASTROENTERAL | @ 12:00:00

## 2020-07-19 MED ADMIN — QUEtiapine (SEROquel) tablet 50 mg: 50 mg | GASTROENTERAL | @ 12:00:00

## 2020-07-19 MED ADMIN — HYDROmorphone 1 mg/mL in 0.9% sodium chloride: 0-14 mg/h | INTRAVENOUS | @ 07:00:00 | Stop: 2020-07-29

## 2020-07-19 MED ADMIN — pravastatin (PRAVACHOL) tablet 40 mg: 40 mg | GASTROENTERAL | @ 12:00:00

## 2020-07-19 MED ADMIN — LORazepam (ATIVAN) tablet 8 mg: 8 mg | GASTROENTERAL | @ 04:00:00

## 2020-07-19 MED ADMIN — chlorhexidine (PERIDEX) 0.12 % solution 5 mL: 5 mL | OROMUCOSAL | @ 12:00:00

## 2020-07-19 MED ADMIN — NxStage RFP 401 (+/- BB) 5000 mL - contains 4 mEq/L of potassium dialysis solution 5,000 mL: 5000 mL | INTRAVENOUS_CENTRAL | @ 08:00:00

## 2020-07-19 MED ADMIN — white petrolatum-mineral oiL (SOOTHE PM) 80-20 % ophthalmic ointment 1 application: 1 | OPHTHALMIC | @ 12:00:00

## 2020-07-19 MED ADMIN — metoclopramide (REGLAN) injection 10 mg: 10 mg | INTRAVENOUS | @ 21:00:00

## 2020-07-19 MED ADMIN — levothyroxine (SYNTHROID) tablet 100 mcg: 100 ug | GASTROENTERAL | @ 10:00:00

## 2020-07-19 MED ADMIN — midazolam in sodium chloride 0.9% (1 mg/mL) infusion PMB: 0-14 mg/h | INTRAVENOUS | @ 12:00:00

## 2020-07-19 MED ADMIN — HYDROmorphone 1 mg/mL in 0.9% sodium chloride: 0-14 mg/h | INTRAVENOUS | @ 13:00:00 | Stop: 2020-07-29

## 2020-07-19 MED ADMIN — midazolam in sodium chloride 0.9% (1 mg/mL) infusion PMB: 0-14 mg/h | INTRAVENOUS | @ 05:00:00

## 2020-07-19 MED ADMIN — norepinephrine 8 mg in sodium chloride 0.9 % 250 mL (32mcg/mL) infusion PMB: 0-30 ug/min | INTRAVENOUS | @ 16:00:00

## 2020-07-19 MED ADMIN — buPROPion (WELLBUTRIN) tablet 100 mg: 100 mg | GASTROENTERAL | @ 12:00:00

## 2020-07-19 MED ADMIN — QUEtiapine (SEROquel) tablet 50 mg: 50 mg | GASTROENTERAL

## 2020-07-19 MED ADMIN — metoclopramide (REGLAN) injection 10 mg: 10 mg | INTRAVENOUS | @ 15:00:00

## 2020-07-19 MED ADMIN — HYDROmorphone 1 mg/mL in 0.9% sodium chloride: 0-14 mg/h | INTRAVENOUS | @ 03:00:00 | Stop: 2020-07-29

## 2020-07-19 MED ADMIN — tacrolimus (PROGRAF) oral suspension: .5 mg | ORAL

## 2020-07-19 MED ADMIN — famotidine (PEPCID) tablet 20 mg: 20 mg | GASTROENTERAL | @ 12:00:00

## 2020-07-19 MED ADMIN — buPROPion (WELLBUTRIN) tablet 100 mg: 100 mg | GASTROENTERAL

## 2020-07-19 MED ADMIN — polyethylene glycol (MIRALAX) packet 17 g: 17 g | GASTROENTERAL | @ 17:00:00

## 2020-07-19 MED ADMIN — HYDROmorphone 1 mg/mL in 0.9% sodium chloride: 0-14 mg/h | INTRAVENOUS | @ 16:00:00 | Stop: 2020-07-29

## 2020-07-19 MED ADMIN — oxyCODONE (ROXICODONE) immediate release tablet 60 mg: 60 mg | GASTROENTERAL | @ 12:00:00 | Stop: 2020-07-30

## 2020-07-19 MED ADMIN — LORazepam (ATIVAN) tablet 8 mg: 8 mg | GASTROENTERAL

## 2020-07-19 NOTE — Unmapped (Signed)
Patient continues to tolerate care well. No acute changes this shift. No turn order continued by overnight team.    Problem: Adult Inpatient Plan of Care  Goal: Plan of Care Review  Outcome: Ongoing - Unchanged  Goal: Patient-Specific Goal (Individualization)  Outcome: Ongoing - Unchanged  Goal: Absence of Hospital-Acquired Illness or Injury  Outcome: Ongoing - Unchanged  Intervention: Identify and Manage Fall Risk  Recent Flowsheet Documentation  Taken 07/18/2020 2000 by Fausto Skillern, RN  Safety Interventions:   aspiration precautions   enteral feeding safety   infection management   isolation precautions   lighting adjusted for tasks/safety   low bed  Intervention: Prevent Infection  Recent Flowsheet Documentation  Taken 07/18/2020 2000 by Fausto Skillern, RN  Infection Prevention:   visitors restricted/screened   single patient room provided   rest/sleep promoted   personal protective equipment utilized   handwashing promoted  Goal: Optimal Comfort and Wellbeing  Outcome: Ongoing - Unchanged  Goal: Readiness for Transition of Care  Outcome: Ongoing - Unchanged  Goal: Rounds/Family Conference  Outcome: Ongoing - Unchanged

## 2020-07-19 NOTE — Unmapped (Signed)
MICU Daily Progress Note     Date of Service: 07/19/2020    Problem List:   Principal Problem:    Acute hypoxemic respiratory failure due to severe acute respiratory syndrome coronavirus 2 (SARS-CoV-2) disease (CMS-HCC)  Active Problems:    Depression    Anxiety    Liver transplant recipient (CMS-HCC)    Morbid obesity with BMI of 40.0-44.9, adult (CMS-HCC)    Acute renal failure (ARF) (CMS-HCC)    Chronic respiratory failure with hypoxia and hypercapnia (CMS-HCC)    Rt hippocampus lesion c/f sz focus    Paralytic ileus (CMS-HCC)    MSSA (methicillin susceptible Staphylococcus aureus) pneumonia (CMS-HCC)    Acute metabolic encephalopathy  Resolved Problems:    * No resolved hospital problems. *      Interval history:  Evelyn Rollins is a 54 y.o. female with PMH cirrhosis status post liver transplant 2016 on chronic immunosuppressive therapy, hypertension, diabetes, hypothyroidism who presented to [facility] on 7/22 with myalgias, sore throat, loss of taste and fevers  x5 days. When her symptoms had started, she took a home Covid test on 7/17 which was found to be positive. She has had poor p.o. intake for the last several days. Her husband also has similar symptoms. In the OSH ED she was found to be severely hypoxic with O2 saturations of 50% on room air. She was placed on a nonrebreather and current saturations increased to the low 90s. Chest x-ray shows bilateral infiltrates consistent with COVID-19 pneumonia. Her Covid test is positive. Inflammatory markers are elevated. She received a dose of Decadron in the emergency room.    ON events:   -remained prone/paralayzed     Neurological   Analgesia and Sedation  -continue dilaudid and versed gtt  -cis gtt (7/31 - 8/5), changed to vec gtt 8/5-->  - goal TOF 1-2/4 plus vent sync wo desat  -PRN dilaudid and versed  -ativan 8q4h, oxy 60q4h  - seroquel 50 mg tid  ??  Anxiety, depression  -restarted home meds Wellbutrin/Lexapro, and Seroquel in place of abilify  ??  Seizure history (not taking AEDs), ?seizure activity  -neurology consult >> c/f RUE weakness, MRI 7/27- no acute pathology  -vEEG negative for seizure x 48hrs >> discont monitoring per neuro 7/25  - MRI with possible sz focus,  keppra 500 mg daily per neuro recommendations       Pulmonary   ARDS, Acute resp failure r/t COVID, pnuemomediastinum .  Intubated 7/22   -worsening oxygenation, hyperapnea and airway pressures (DPs from 20 to 24 in less than a week)  - prone since 430pm on 8/2  - remains paralyzed and sedated.  intmt desats to 60's with movement and taking longer to recover.    - CXR remains largely unchanged w heterogeneous opacities, persistent LLL consolidation   - family aware of tenuous status and has visited.     Cardiovascular   h/o HTN   - holding home antihtn  - last echo 2016 w nml LVEF, enlrgd RV and deprssed RV fxn w mild phtn  -continue statin     Septic shock  -continue levophed for SBP >100  - vaso added for MAP>65  - unable to tolerate UF at rate higher than 200 d/t low BP    Renal   ARF on CRRT:  - cont foley for accurate I/O during critical illness   - replete electrolytes prn and monitor daily  - nephrology following  - CRRT goal UF 150-200/hr  - goal  euvolemic to net neg    Infectious Disease/Autoimmune   COVID 19 infection  Symptom onset:??7/17  Exposure: unknown  COVID PCR+: 7/17  OSH Admission:??7/22  Day of admission/transfer: 7/22  OSH Meds Given:??dexamethasone  COVID Specific??Meds:??dexamethasone 7/22, remdesivir 7/22  Vaccination status: not vaccinated  ??  Monitoring:  - Special airborne/contact precautions (If unavailable, droplet & contact precautions)  - f/u daily CMP, CBC w/ diff, CRP, D-dimer, DIC, troponin, pro-BNP with weekly ferritin, LDH and cytokine level  - f/u q4h lactate; ABG  ??  Medications:   - completed remdesivir and decadron   ??  CAP/VAP: MSSA   - febrile, leukocytosis unchanged  - previously on cefep/vanc 7/25--> changed to ancef on 7/27 x7 days for MSSA  - pan cx for T>38.2   - wbc count rising, hypoxia worsened, broad spectrum abx started 8/1  - MRSA screen neg 8/2, vanc stopped 8/4  - cont cefepime started 8/1, plan for 10d course to cover VAP    Cultures:  Blood Culture, Routine (no units)   Date Value   07/12/2020 No Growth at 5 days   07/12/2020 No Growth at 5 days     Lower Respiratory Culture (no units)   Date Value   07/12/2020 OROPHARYNGEAL FLORA ISOLATED   07/04/2020 3+ Methicillin-Susceptible Staphylococcus aureus (A)     WBC (10*9/L)   Date Value   07/19/2020 14.7 (H)     WBC, UA (/HPF)   Date Value   07/12/2020 7 (H)          FEN/GI   Paralytic ileus  - reglan started for high residuals >> improved by 8/8  - resume trickle feeds   -bowel regimen w senna and miralax, FMS with stool  -PPI  ??  Immunocompromised s/p Liver transplant in 2016  -restarted tacro 1mg /0.5mg   - f/u tacro level, goal 3-5  -HOLD cellcept    Heme/Coag   Hypercoagulable state 2/2 covid, critical illness anemia  -anticoagulation group C heparin  -LE dopplers neg for DVT 7/24  -no evid bleeding  - ddimer increased to group B.  Changed to low intensity heparin gtt   - last tx 2 units PRBC on 8/5, hgb goal >7    Endocrine   History of DM2  - glucose wnl  - no longer on NPH  - cont ADI    Hypothyroidism  -continue synthroid  ??  Integumentary   Facial DTI, left cheek  - WOCN consulted for high risk skin assessment Yes.  - WOCN recs >> agree w assmt 8/2 Mepilex Xt cut to fit above new ET trial holder, proning protocol in place, interdry can be used to folds  - cont pressure mitigating precautions per skin policy    Prophylaxis/LDA/Restraints/Consults   Can CVC be removed? No: need for medications requiring central access (e.g. pressors)   Can A-line be removed? No: frequent ABGs  Can Foley be removed? No: Need continuous I/O  Mobility plan: Step 1 - Range of motion    Feeding: Trickle feeds, advance as tolerated  Analgesia: No pain issues  Sedation SAT/SBT: No PEEP > 8  Thromboembolic ppx: Enoxaparin  Head of bed >30 degrees: Yes  Ulcer ppx: Yes, coagulopathy  Glucose within target range: Yes, in range    Does patient need/have an active type/screen? NA    RASS at goal? Yes  [place] Agitation Assessment Scale (RASS) : -5 (07/19/2020  9:11 AM)     Can antipsychotics be stopped? N/A, not on antipsychotics  CAM-ICU Result:  Positive (07/19/2020  8:00 AM)      Would hospice care be appropriate for this patient- yes but too unstable     Patient Lines/Drains/Airways Status    Active Active Lines, Drains, & Airways     Name:   Placement date:   Placement time:   Site:   Days:    ETT  7.5   07/10/2020    1700     16    CVC Triple Lumen 07/10/20 Non-tunneled Right Internal jugular   July 10, 2020    1945    Internal jugular   16    Hemodialysis Catheter With Distal Infusion Port 07/09/20 Left Internal jugular 1.4 mL 1.4 mL   07/09/20    1700    Internal jugular   9    NG/OG Tube Decompression;Feedings Center mouth   07/03/20    0610    Center mouth   16    Urethral Catheter   10-Jul-2020    ???    ???   17    Arterial Line 07-10-20 Right Radial   07/10/20    2315    Radial   16              Patient Lines/Drains/Airways Status    Active Wounds     Name:   Placement date:   Placement time:   Site:   Days:    Wound 07/11/20 Pressure Injury Face Left cheek under prev ET tube holder- pt was proned DTI   07/11/20    1100    Face   8    Wound 07/13/20 Pressure Injury Nose from ET holder. pt was proned  DTI   07/13/20    1254    Nose   6    Wound 07/13/20 Pressure Injury Other (Comment) Right cheek r/t proning DTI   07/13/20    1255    Other (Comment)   6                Goals of Care     Code Status: Full Code    Designated Healthcare Decision Maker:  Ms. Peale current decisional capacity for healthcare decision-making is incapacitated. Her designated Educational psychologist) is/are her husband Evelyn Rollins .      Subjective     Intubated, sedated, prone    Objective     Vitals - past 24 hours  Temp:  [35.9 ??C (96.7 ??F)-37.4 ??C (99.3 ??F)] 35.9 ??C (96.7 ??F)  Heart Rate:  [85-104] 92  SpO2 Pulse:  [85-104] 92  Resp:  [30] 30  FiO2 (%):  [100 %] 100 %  SpO2:  [77 %-92 %] 77 % Intake/Output  I/O last 3 completed shifts:  In: 3294.7 [P.O.:5; I.V.:1679.7; NG/GT:1610]  Out: 6600 [Urine:5; Other:6595]     ??  Physical Exam:   General: NAD, well nourished, obese, intubated, sedated, prone  HEENT:  normocephalic, trachea mdl, anicteric, no scleral edema, no conjuctival erythema/drainage, MMM and pink   CV: RRR. S1 and S2 normal, DP Pulses 1  equal. 3+ BLE edema.  Lungs:   chest rise symm, diminished bases. Scattered rhonchi, no wheezes/crackles  Skin:   w/d/i, no jaundice present, no rashes, lesions, petechiae or breakdown. no drng, erythema.    Abd: contour, abdomen soft, non-tender and not distended. Normoactive bowel sounds x 4 quads, no rebound tenderness or guarding.   Ext: No cyanosis, clubbing or edema, scattered punctate bruises BUE and facial DTI Lt cheek   Neuro: perrl 3Sl, mdl, significant  orbital edema, +c/c/g,     Continuous Infusions:   ??? heparin 12 Units/kg/hr (07/19/20 0250)   ??? HYDROmorphone 14 mg/hr (07/19/20 1201)   ??? midazolam (1 mg/mL) infusion 14 mg/hr (07/19/20 0803)   ??? norepinephrine bitartrate-NS 10 mcg/min (07/19/20 1155)   ??? NxStage RFP 400 (+/- BB) 5000 mL - contains 2 mEq/L of potassium     ??? NxStage RFP 401 (+/- BB) 5000 mL - contains 4 mEq/L of potassium     ??? vasopressin 0.03 Units/min (07/19/20 1305)   ??? vecuronium infusion 100 mg/100 mL (1 mg/mL) 2 mcg/kg/min (07/19/20 0522)       Scheduled Medications:   ??? buPROPion  100 mg Enteral tube: gastric  TID   ??? Cefepime  2 g Intravenous Q12H   ??? chlorhexidine  5 mL Mouth BID   ??? escitalopram oxalate  20 mg Enteral tube: gastric  Daily   ??? famotidine  20 mg Enteral tube: gastric  Daily   ??? insulin regular  0-12 Units Subcutaneous Q6H [facility]   ??? levETIRAcetam  500 mg Enteral tube: gastric  Daily   ??? levothyroxine  100 mcg Enteral tube: gastric  daily   ??? LORazepam  8 mg Enteral tube: gastric  Q4H   ??? metoclopramide  10 mg Intravenous Q6H SCH   ??? oxyCODONE  60 mg Enteral tube: gastric  Q4H   ??? polyethylen glycol  17 g Enteral tube: gastric  TID   ??? pravastatin  40 mg Enteral tube: gastric  Daily   ??? QUEtiapine  50 mg Enteral tube: gastric  TID   ??? sennosides  10 mL Enteral tube: gastric  Nightly   ??? Tacrolimus  1 mg Enteral tube: gastric  Daily   ??? Tacrolimus  0.5 mg Oral Nightly (2000)   ??? white petrolatum-mineral oiL  1 application Both Eyes BID       PRN medications:  dextrose 50 % in water (D50W), heparin (porcine), HYDROmorphone, midazolam, VECuronium    Data/Imaging Review: Reviewed in Epic and personally interpreted on 07/19/2020. See EMR for detailed results.      Critical Care Attestation     This patient is critically ill or injured with the impairment of vital organ systems such that there is a high probability of imminent or life threatening deterioration in the patient's condition. This patient must remain in the ICU for ongoing evaluation of the comprehensive management plan outlined in this note. I directly provided critical care services as documented in this note and the critical care time spent (55 min) is exclusive of separately billable procedures.    Johnathan Tortorelli Fonnie Mu, ACNP

## 2020-07-19 NOTE — Unmapped (Signed)
MICU Nightshift Note     Date of Service: 07/19/2020    Principal Problem:    Acute hypoxemic respiratory failure due to severe acute respiratory syndrome coronavirus 2 (SARS-CoV-2) disease (CMS-HCC)  Active Problems:    Liver transplant recipient (CMS-HCC)    Morbid obesity with BMI of 40.0-44.9, adult (CMS-HCC)    Acute renal failure (ARF) (CMS-HCC)  Resolved Problems:    * No resolved hospital problems. *          Plan summary     Patient continues to need full mechanical ventilatory support.  Saturations all night between 80 and 90%.    Overnight blood gases and PaO2 above 63 at midnight with saturation 90%.    Darnelle Bos, MD

## 2020-07-19 NOTE — Unmapped (Signed)
[facility] Nephrology Continuous Renal Replacement Therapy Procedure Note     07/19/2020    Evelyn Rollins was seen and examined on CRRT    CHIEF COMPLAINT: Acute Kidney Disease    INTERVAL HISTORY:. No acute overnight changes.     CURRENT DIALYSIS PRESCRIPTION:  Device: CRRT Device: NxStage  Therapy fluid: Therapy Fluid : NxStage RFP 401 - Contains 4 mEq/L KCL  Therapy fluid rate: Therapy Fluid Rate (L/hr): 2.7 L/hr  Blood flow rate: Blood Pump Rate (mL/min): 300 mL/min  Fluid removal rate: Hourly Fluid Removal Rate (mL/hr): 150 mL/hr    PHYSICAL EXAM:  Vitals:  Temp:  [35.9 ??C-37.4 ??C] 35.9 ??C  Heart Rate:  [85-104] 94  SpO2 Pulse:  [85-104] 94  MAP:  [47 mmHg-74 mmHg] 61 mmHg  A BP-2: (85-142)/(34-54) 112/44  MAP:  [47 mmHg-74 mmHg] 61 mmHg    In/Outs:    Intake/Output Summary (Last 24 hours) at 07/19/2020 1236  Last data filed at 07/19/2020 1100  Gross per 24 hour   Intake 2126.31 ml   Output 4211 ml   Net -2084.69 ml        Weights:  Admission Weight: (!) 104.8 kg (231 lb)  Last documented Weight: (!) 108 kg (238 lb 1.6 oz)  Weight Change from Previous Day: No weight listed for specified days    Assessment:   General: Appearing ill  Pulmonary: prone; intubated; diminished breath sounds  Cardiovascular: distant heart sounds  Extremities: +2 edema  Access: Left IJ non-tunneled catheter     LAB DATA:  Lab Results   Component Value Date    NA 134 (L) 07/19/2020    NA 137 07/19/2020    K 4.9 (H) 07/19/2020    K 4.8 (H) 07/19/2020    CL 103 07/19/2020    CO2 28.0 07/19/2020    BUN 18 07/19/2020    CREATININE 1.20 (H) 07/19/2020    CALCIUM 8.7 07/19/2020    MG 1.9 07/19/2020    PHOS 2.7 07/19/2020    ALBUMIN 1.7 (L) 07/19/2020      Lab Results   Component Value Date    HCT 29.1 (L) 07/19/2020    HGB 9.2 (L) 07/19/2020    WBC 14.7 (H) 07/19/2020        ASSESSMENT/PLAN:  Acute Kidney Disease on Continuous Renal Replacement Therapy:  - UF goal: 200 mL/hr as tolerated  - Anticoagulation: N/A  - Renally dose all medications; prograf per pharmacy    Justine Null, MD  [facility] Division of Nephrology & Hypertension

## 2020-07-19 NOTE — Unmapped (Signed)
Vent settings remain unchanged this shift. PRVC 100% PEEP 18. Sats in mid 80's for most of the afternoon. Norepinephrine increased to 69mcg/min for SBP >100. Dilaudid, versed and vecuronium drips continued. Family (husband and mother) visited today.    Problem: Adult Inpatient Plan of Care  Goal: Plan of Care Review  Outcome: Not Progressing  Goal: Patient-Specific Goal (Individualization)  Outcome: Not Progressing  Goal: Absence of Hospital-Acquired Illness or Injury  Outcome: Not Progressing  Goal: Optimal Comfort and Wellbeing  Outcome: Not Progressing  Goal: Readiness for Transition of Care  Outcome: Not Progressing  Goal: Rounds/Family Conference  Outcome: Not Progressing     Problem: Skin Injury Risk Increased  Goal: Skin Health and Integrity  Outcome: Not Progressing     Problem: Infection  Goal: Infection Symptom Resolution  Outcome: Not Progressing     Problem: Fall Injury Risk  Goal: Absence of Fall and Fall-Related Injury  Outcome: Not Progressing     Problem: Wound  Goal: Optimal Wound Healing  Outcome: Not Progressing     Problem: Communication Impairment (Mechanical Ventilation, Invasive)  Goal: Effective Communication  Outcome: Not Progressing     Problem: Device-Related Complication Risk (Mechanical Ventilation, Invasive)  Goal: Optimal Device Function  Outcome: Not Progressing     Problem: Inability to Wean (Mechanical Ventilation, Invasive)  Goal: Mechanical Ventilation Liberation  Outcome: Not Progressing     Problem: Skin and Tissue Injury (Mechanical Ventilation, Invasive)  Goal: Absence of Device-Related Skin and Tissue Injury  Outcome: Not Progressing     Problem: Ventilator-Induced Lung Injury (Mechanical Ventilation, Invasive)  Goal: Absence of Ventilator-Induced Lung Injury  Outcome: Not Progressing     Problem: Self-Care Deficit  Goal: Improved Ability to Complete Activities of Daily Living  Outcome: Not Progressing     Problem: Asthma Comorbidity  Goal: Maintenance of Asthma Control Outcome: Not Progressing     Problem: COPD Comorbidity  Goal: Maintenance of COPD Symptom Control  Outcome: Not Progressing     Problem: Diabetes Comorbidity  Goal: Blood Glucose Level Within Desired Range  Outcome: Not Progressing     Problem: Heart Failure Comorbidity  Goal: Maintenance of Heart Failure Symptom Control  Outcome: Not Progressing     Problem: Hypertension Comorbidity  Goal: Blood Pressure in Desired Range  Outcome: Not Progressing     Problem: Obstructive Sleep Apnea Risk or Actual (Comorbidity Management)  Goal: Unobstructed Breathing During Sleep  Outcome: Not Progressing     Problem: Pain Chronic (Persistent) (Comorbidity Management)  Goal: Acceptable Pain Control and Functional Ability  Outcome: Not Progressing     Problem: Seizure Disorder Comorbidity  Goal: Maintenance of Seizure Control  Outcome: Not Progressing     Problem: LTC COVID-19 Confirmed or Rule-Out  Goal: Patient/Resident will remain free of complications due to COVID-19  Description: 1. Review and update the patient/resident's isolation status in the Isolation activity  2. Keep the patient/resident's door closed at all times and limit movement of the patient/resident outside of the room to medically essential purposes. If applicable, transfer patient/resident to a single-person room   3. Educate and reinforce infection prevention and control practices recommended by CDC  4. Frequently monitor for development of more severe symptoms   5. Use appropriate PPE when providing care for patient/resident  6. Reinforce no visitor policy and non-essential health care personnel policy, except for certain compassionate care situations  7. If worsening of symptoms occur, alert the nearest Hospital caring for confirmed COVID-19 patients and arrange for transfer with proper precautions including  placing a facemask on the patient/resident during transfer  8. Communicate information about known or suspected case of COVID-19 to appropriate public health personnel  9. Avoid procedures that are likely to induce coughing (e.g., sputum induction, open suctioning of airways). If required, do so in an Airborne Infection Isolation Room. The health care provider in the room should wear an N95 or higher-level respirator, eye protection, gloves, and a gown. The number of HCP present during the procedure should be limited to only those essential for patient/resident care and procedure support. Visitors should not be present for the procedure. Clean and disinfect procedure room surfaces promptly  10. Update patient/resident and family/representatives as needed      Outcome: Not Progressing     Problem: Gas Exchange Impaired  Goal: Optimal Gas Exchange  Outcome: Not Progressing     Problem: Electrolyte Imbalance (Acute Kidney Injury/Impairment)  Goal: Serum Electrolyte Balance  Outcome: Not Progressing     Problem: Fluid Imbalance (Acute Kidney Injury/Impairment)  Goal: Optimal Fluid Balance  Outcome: Not Progressing     Problem: Hematologic Alteration (Acute Kidney Injury/Impairment)  Goal: Hemoglobin, Hematocrit and Platelets Within Normal Range  Outcome: Not Progressing     Problem: Oral Intake Inadequate (Acute Kidney Injury/Impairment)  Goal: Optimal Nutrition Intake  Outcome: Not Progressing     Problem: Renal Function Impairment (Acute Kidney Injury/Impairment)  Goal: Effective Renal Function  Outcome: Not Progressing     Problem: Communication Impairment (Artificial Airway)  Goal: Effective Communication  Outcome: Not Progressing     Problem: Device-Related Complication Risk (Artificial Airway)  Goal: Optimal Device Function  Outcome: Not Progressing     Problem: Skin and Tissue Injury (Artificial Airway)  Goal: Absence of Device-Related Skin or Tissue Injury  Outcome: Not Progressing

## 2020-07-20 LAB — COMPREHENSIVE METABOLIC PANEL
ALBUMIN: 1.8 g/dL — ABNORMAL LOW (ref 3.4–5.0)
ALKALINE PHOSPHATASE: 106 U/L (ref 46–116)
ALT (SGPT): 15 U/L (ref 10–49)
ANION GAP: 6 mmol/L (ref 5–14)
AST (SGOT): 36 U/L — ABNORMAL HIGH (ref <=34)
BILIRUBIN TOTAL: 0.4 mg/dL (ref 0.3–1.2)
BLOOD UREA NITROGEN: 21 mg/dL (ref 9–23)
BUN / CREAT RATIO: 18
CALCIUM: 8.6 mg/dL — ABNORMAL LOW (ref 8.7–10.4)
CO2: 25 mmol/L (ref 20.0–31.0)
CREATININE: 1.19 mg/dL — ABNORMAL HIGH
EGFR CKD-EPI AA FEMALE: 60 mL/min/{1.73_m2} (ref >=60)
EGFR CKD-EPI NON-AA FEMALE: 52 mL/min/{1.73_m2} — ABNORMAL LOW (ref >=60)
GLUCOSE RANDOM: 177 mg/dL (ref 70–179)
POTASSIUM: 5.8 mmol/L — ABNORMAL HIGH (ref 3.4–4.5)
PROTEIN TOTAL: 6.3 g/dL (ref 5.7–8.2)
SODIUM: 133 mmol/L — ABNORMAL LOW (ref 135–145)

## 2020-07-20 LAB — CBC W/ AUTO DIFF
BASOPHILS ABSOLUTE COUNT: 0.4 10*9/L — ABNORMAL HIGH (ref 0.0–0.1)
EOSINOPHILS RELATIVE PERCENT: 1.3 %
HEMATOCRIT: 31.4 % — ABNORMAL LOW (ref 36.0–46.0)
HEMOGLOBIN: 9.6 g/dL — ABNORMAL LOW (ref 12.0–16.0)
LARGE UNSTAINED CELLS: 2 % (ref 0–4)
LYMPHOCYTES ABSOLUTE COUNT: 3.3 10*9/L (ref 1.5–5.0)
LYMPHOCYTES RELATIVE PERCENT: 12.8 %
MEAN CORPUSCULAR HEMOGLOBIN CONC: 30.6 g/dL — ABNORMAL LOW (ref 31.0–37.0)
MEAN CORPUSCULAR HEMOGLOBIN: 30.3 pg (ref 26.0–34.0)
MEAN PLATELET VOLUME: 10.3 fL — ABNORMAL HIGH (ref 7.0–10.0)
MONOCYTES ABSOLUTE COUNT: 1.1 10*9/L — ABNORMAL HIGH (ref 0.2–0.8)
MONOCYTES RELATIVE PERCENT: 4.2 %
NEUTROPHILS ABSOLUTE COUNT: 20.3 10*9/L — ABNORMAL HIGH (ref 2.0–7.5)
NEUTROPHILS RELATIVE PERCENT: 78.1 %
PLATELET COUNT: 28 10*9/L — ABNORMAL LOW (ref 150–440)
RED BLOOD CELL COUNT: 3.17 10*12/L — ABNORMAL LOW (ref 4.00–5.20)
RED CELL DISTRIBUTION WIDTH: 17.2 % — ABNORMAL HIGH (ref 12.0–15.0)
WBC ADJUSTED: 26 10*9/L — ABNORMAL HIGH (ref 4.5–11.0)

## 2020-07-20 LAB — BLOOD GAS CRITICAL CARE PANEL, ARTERIAL
BASE EXCESS ARTERIAL: -5.5 — ABNORMAL LOW (ref -2.0–2.0)
CALCIUM IONIZED ARTERIAL (MG/DL): 4.61 mg/dL (ref 4.40–5.40)
HCO3 ARTERIAL: 23 mmol/L (ref 22–27)
HEMOGLOBIN BLOOD GAS: 11.6 g/dL — ABNORMAL LOW (ref 12.00–16.00)
LACTATE BLOOD ARTERIAL: 3.1 mmol/L — ABNORMAL HIGH (ref <1.3)
O2 SATURATION ARTERIAL: 62 % — CL (ref 94.0–100.0)
PH ARTERIAL: 7.11 — CL (ref 7.35–7.45)
PO2 ARTERIAL: 38 mmHg — CL (ref 80.0–110.0)
POTASSIUM WHOLE BLOOD: 4.9 mmol/L — ABNORMAL HIGH (ref 3.4–4.6)
SODIUM WHOLE BLOOD: 137 mmol/L (ref 135–145)

## 2020-07-20 LAB — EGFR CKD-EPI AA FEMALE: Glomerular filtration rate/1.73 sq M.predicted.black:ArVRat:Pt:Ser/Plas/Bld:Qn:Creatinine-based formula (CKD-EPI): 60

## 2020-07-20 LAB — HEPARIN CORRELATION
Lab: 0.3
Lab: 0.3

## 2020-07-20 LAB — PHOSPHORUS: Phosphate:MCnc:Pt:Ser/Plas:Qn:: 4.3

## 2020-07-20 LAB — MAGNESIUM
MAGNESIUM: 2 mg/dL (ref 1.6–2.6)
Magnesium:MCnc:Pt:Ser/Plas:Qn:: 2

## 2020-07-20 LAB — HCO3 ARTERIAL: Bicarbonate:SCnc:Pt:BldA:Qn:: 23

## 2020-07-20 LAB — NEUTROPHILS RELATIVE PERCENT: Neutrophils/100 leukocytes:NFr:Pt:Bld:Qn:Automated count: 78.1

## 2020-07-20 LAB — D-DIMER QUANTITATIVE (CW,ML,HL): Lab: 6995 — ABNORMAL HIGH

## 2020-07-20 MED ADMIN — midazolam (VERSED) injection 5 mg: 5 mg | INTRAVENOUS | @ 04:00:00

## 2020-07-20 MED ADMIN — sennosides (SENOKOT) oral syrup: 10 mL | GASTROENTERAL | @ 01:00:00

## 2020-07-20 MED ADMIN — oxyCODONE (ROXICODONE) immediate release tablet 60 mg: 60 mg | GASTROENTERAL | @ 04:00:00 | Stop: 2020-07-30

## 2020-07-20 MED ADMIN — white petrolatum-mineral oiL (SOOTHE PM) 80-20 % ophthalmic ointment 1 application: 1 | OPHTHALMIC | @ 01:00:00

## 2020-07-20 MED ADMIN — oxyCODONE (ROXICODONE) immediate release tablet 60 mg: 60 mg | GASTROENTERAL | @ 01:00:00 | Stop: 2020-07-30

## 2020-07-20 MED ADMIN — metoclopramide (REGLAN) injection 10 mg: 10 mg | INTRAVENOUS | @ 09:00:00 | Stop: 2020-07-20

## 2020-07-20 MED ADMIN — buPROPion (WELLBUTRIN) tablet 100 mg: 100 mg | GASTROENTERAL | @ 01:00:00

## 2020-07-20 MED ADMIN — LORazepam (ATIVAN) tablet 8 mg: 8 mg | GASTROENTERAL | @ 09:00:00 | Stop: 2020-07-20

## 2020-07-20 MED ADMIN — sodium bicarbonate injection 50 mEq: 50 meq | INTRAVENOUS | @ 07:00:00 | Stop: 2020-07-20

## 2020-07-20 MED ADMIN — levothyroxine (SYNTHROID) tablet 100 mcg: 100 ug | GASTROENTERAL | @ 09:00:00 | Stop: 2020-07-20

## 2020-07-20 MED ADMIN — sodium bicarbonate 1 mEq/mL (8.4 %) injection: INTRAVENOUS | @ 07:00:00 | Stop: 2020-07-20

## 2020-07-20 MED ADMIN — QUEtiapine (SEROquel) tablet 50 mg: 50 mg | GASTROENTERAL | @ 01:00:00

## 2020-07-20 MED ADMIN — NxStage RFP 401 (+/- BB) 5000 mL - contains 4 mEq/L of potassium dialysis solution 5,000 mL: 5000 mL | INTRAVENOUS_CENTRAL | @ 04:00:00

## 2020-07-20 MED ADMIN — HYDROmorphone 1 mg/mL in 0.9% sodium chloride: 0-14 mg/h | INTRAVENOUS | @ 10:00:00 | Stop: 2020-07-20

## 2020-07-20 MED ADMIN — LORazepam (ATIVAN) tablet 8 mg: 8 mg | GASTROENTERAL | @ 01:00:00

## 2020-07-20 MED ADMIN — midazolam in sodium chloride 0.9% (1 mg/mL) infusion PMB: 0-14 mg/h | INTRAVENOUS | @ 09:00:00 | Stop: 2020-07-20

## 2020-07-20 MED ADMIN — metoclopramide (REGLAN) injection 10 mg: 10 mg | INTRAVENOUS | @ 04:00:00

## 2020-07-20 MED ADMIN — polyethylene glycol (MIRALAX) packet 17 g: 17 g | GASTROENTERAL | @ 01:00:00

## 2020-07-20 MED ADMIN — LORazepam (ATIVAN) tablet 8 mg: 8 mg | GASTROENTERAL | @ 04:00:00

## 2020-07-20 MED ADMIN — midazolam in sodium chloride 0.9% (1 mg/mL) infusion PMB: 0-14 mg/h | INTRAVENOUS | @ 02:00:00

## 2020-07-20 NOTE — Unmapped (Signed)
Patient passed shortly after 6am this morning. She was surrounded by family.

## 2020-07-20 NOTE — Unmapped (Signed)
MICU Evening Summary     Date of Service: 07/19/2020    Interval History: Evelyn Rollins is a 54 y.o. female h/o of NAFLD cirrhosis s/p liver transplant in 2016 on chronic immune suppression with cell cept and tacrolimus, htn, DM, and hypothyroidism admitted 7.22 with myalgias, sore throat, loss of taste and fever x 5 days. ??Pt was intubated for hypoxic respiratory failure. Critical care services are indicated for AHRF r/t covid pna, AKI, ileus, VAP.      Assessment & Plan     Neuro: cont scheduled seroquel/ativan/oxy w dilaudid/versed gtts and cisat. Remains sync w vent. RASS at goal  Pulm: cont prvc, remains on 100%DPs remain high mid 20s w 89ml/kg for persistent hypercapnea, refractory hypoxemia and hypercapnea, remains prone w inability to move in bed d/t desat to 70s  ZO:XWRU levo for MAP>65, started vaso after attempt to inc UF, able to wean levo back down to 6 w vaso onboard  Renal: CRRT at 200 UF, unable to remove more volume BP limited (goal max levo 10),   GI: tol TF well on reglan, cont bowel regimen, FMS in situ  Heme: cont heparin group B, therapeutic hep corr  Endo: cont nph 8 q12 plus ADI  ID/Immuno: course completed for MSSA pna and vanc for emperic VAP cvg, cont  cefep x 10d total for leukocytosis, remains afebrile (though on CRRT), infectious w/u unremarkable, cont prograf, f/u level in am  Goals of care: GOC discussion by phone regarding worsening resp status amidst max support, daughter agreed to DNR, spouse updated as well, family en route for end of life visit    Critical Care Attestation     This patient is critically ill or injured with the impairment of vital organ systems such that there is a high probability of imminent or life threatening deterioration in the patient's condition. This patient must remain in the ICU for ongoing evaluation of the comprehensive management plan outlined in this note. I directly provided critical care services as documented in this note and the critical care time spent (45 min) is exclusive of separately billable procedures.    Yamili Lichtenwalner Fonnie Mu, ACNP

## 2020-07-20 NOTE — Unmapped (Signed)
CRRT not restarted, MICU team and Nephrology consulted to assess and was decided not to continue, blood returned.

## 2020-07-20 NOTE — Unmapped (Signed)
MICU Discharge Summary     Admit Date: 07-23-20  Discharge Date: 07/12/2020  Admitting Physician: [name], MD   Discharge Physician: Montine Circle, *   Primary Care Provider: Rebecka Apley, NP    Admission Diagnoses:   AHRF r/t covid pna    Discharge Diagnoses:   Patient Active Problem List    Diagnosis Date Noted   ??? Chronic respiratory failure with hypoxia and hypercapnia (CMS-HCC) 07/19/2020   ??? Rt hippocampus lesion c/f sz focus 07/19/2020   ??? Paralytic ileus (CMS-HCC) 07/19/2020   ??? MSSA (methicillin susceptible Staphylococcus aureus) pneumonia (CMS-HCC) 07/19/2020   ??? Acute renal failure (ARF) (CMS-HCC) 07/11/2020   ??? Acute hypoxemic respiratory failure due to severe acute respiratory syndrome coronavirus 2 (SARS-CoV-2) disease (CMS-HCC) July 23, 2020   ??? Acute metabolic encephalopathy July 23, 2020   ??? Ventral incisional hernia 09/21/2017   ??? Morbid obesity with BMI of 40.0-44.9, adult (CMS-HCC) 09/21/2017   ??? 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 Bile leak, postoperative 07/06/2015   ??? Liver transplant recipient (CMS-HCC) 07/05/2015   ??? Hyperkalemia 06/17/2015   ??? Acute kidney injury (CMS-HCC) 06/17/2015   ??? Hyponatremia 06/16/2015   ??? Fatigue 06/16/2015   ??? Anxiety 06/16/2015   ??? Shortness of breath 03/20/2015   ??? Thrombocytopenia (CMS-HCC) 03/20/2015   ??? Ascites 03/19/2015   ??? Depression 02/10/2015         Brief History of Present Illness:   54 y.o. year-old female PMHx HTN, DM, hypothydroidism, anxiety/depression, and liver transplant for NAFLD cirrhosis in 2016 (at [facility]) presents to OSH with COVID, transferred to [facility] on 7/22.     Hospital Course:    Evelyn Rollins is a 54 y.o. female with PMH cirrhosis status post liver transplant 2016 on chronic immunosuppressive therapy, hypertension, diabetes, hypothyroidism who presented to [facility] on 7/22 with myalgias, sore throat, loss of taste and fevers  x5 days. When her symptoms had started, she took a home Covid test on 7/17 which was found to be positive. She has had poor p.o. intake for the last several days. Her husband also has similar symptoms. In the OSH ED she was found to be severely hypoxic with O2 saturations of 50% on room air. She was placed on a nonrebreather and current saturations increased to the low 90s. Chest x-ray shows bilateral infiltrates consistent with COVID-19 pneumonia. Her Covid test is positive. Inflammatory markers are elevated. She received a dose of Decadron in the emergency room.      Her MICU course has been complicated by worsening hypoxic respiratory failure requiring proning, paralytics and deep sedation. She is only able to be supinated for 4 hours before hypoxia worsens. She has also gone into renal failure and has been on CRRT. Given increasing pressor requirement, worsening hypoxia, infiltrate in RLL and increase in WBC, she was started on vanc/cefe with plan to treat for a 7 day course. Vanc can be discontinued if MRSA swab is negative. Family does not want to give up on her, family meeting held on 8/1, see ACP note for details, she remains full code.    Seizure history (not taking AEDs), ?seizure activity  -neurology consult >> c/f RUE weakness, MRI 7/27- no acute pathology  -vEEG negative for seizure x 48hrs >> discont monitoring per neuro 7/25  - MRI with possible sz focus,  keppra 500 mg daily per neuro recommendations     Analgesia and Sedation  -continue dilaudid and versed gtt  -  cis gtt (7/31 - 8/5) vec gtt 8/5-->  -PRN dilaudid and versed  -ativan 8q4h, oxy 60q4h  - seroquel 50 mg tid    ARDS, Acute resp failure r/t COVID, pnuemomediastinum .  Intubated 7/22   -worsening oxygenation and airway pressures higher  - prone since 430pm on 8/2  - remains paralyzed and sedated.  desats to 60's with movement and taking longer to recover.    - family aware of tenuous status and has visited    ARF on CRRT:  - cont foley for accurate I/O during critical illness   - replete electrolytes prn and monitor daily  - nephrology following  - CRRT goal UF 150-200/hr    COVID 19 infection  Symptom onset:??7/17  Exposure: unknown  COVID PCR+: 7/17  OSH Admission:??7/22  Day of admission/transfer: 7/22  OSH Meds Given:??dexamethasone  COVID Specific??Meds:??dexamethasone 7/22, remdesivir 7/22  Vaccination status:??not vaccinated    CAP/VAP: MSSA   - febrile, leukocytosis   - started cefep/vanc 7/25--> changed to ancef on 7/27 x7 days  - pan cx for T>38.2   - wbc count rising, hypoxia worsening, broad spectrum abx   (vanc 8/1-8/4)  Cefepime completed 8/6    Immunocompromised s/p Liver transplant in 2016  -restarted tacro 1mg /0.5mg   - f/u tacro level  -HOLD cellcept    Hypercoagulable state 2/2 covid  -anticoagulation group C heparin  -LE dopplers neg for DVT 7/24  -no evid bleeding  - anemia: h/h 6.8/21.9: tx 2 units PRBC on 8/5  - ddimer increased to group B.  Changed to low intensity heparin gtt     Dispo: refractory hypoxemia amidst max support on vent, multisystem organ failure on CRRT and mulitple pressors. After several GOC discussions throughout hospitalization, family was called overnight 8/9 by attending to discuss worsening hypoxia and inability to mitigate w ongoing aggressive measures (including proning for several days straight). The pt spouse and daughter were amenable to withholding cardiac resusc given the futility of care and arrived to be at her bedside as she transitioned to passing away. Chaplain was called to bedside and pts spouse, daughter and son were at bedside when she was pronounced at [phone].        Consult Orders:  IP CONSULT TO NUTRITION SERVICES  IP CONSULT TO CVAD LIAISON  IP CONSULT TO WOUND NURSE  IP CONSULT TO NEPHROLOGY  IP CONSULT TO CVAD LIAISON  IP CONSULT TO WOUND NURSE  IP CONSULT TO CVAD LIAISON     Allergies:  No Known Allergies    Code Status:   DNR and DNI        Discharge Management:   This plan was discussed with Dr. Tresa Res who is available and in agreement. I personally performed this service and I spent less than 30 minutes in the discharge of this patient.      Irl Bodie Fonnie Mu, ACNP    07/25/2020 6:26 AM

## 2020-07-20 NOTE — Unmapped (Signed)
Received Epic notice that patient passed way this morning.     Sent communication to [employer] and [facility] about pt's passing away this morning, August 9 and for them remove her from the annual appointment list.     Called and spoke with pt's husband and expressed our condolences. Pt's husband surrounded by family. Mentioned this will be shared with Amil Amen and the team.

## 2020-08-12 DEATH — deceased

## 2021-07-21 NOTE — Unmapped (Signed)
12/25/2017 appt canceled
